# Patient Record
Sex: Female | Born: 1955 | Race: Black or African American | Hispanic: No | Marital: Single | Smoking: Former smoker
Health system: Southern US, Community
[De-identification: ages and names within clinical notes are randomized; demographics above are authoritative.]

## PROBLEM LIST (undated history)

## (undated) DIAGNOSIS — Z87442 Personal history of urinary calculi: Secondary | ICD-10-CM

## (undated) DIAGNOSIS — I10 Essential (primary) hypertension: Secondary | ICD-10-CM

## (undated) DIAGNOSIS — I779 Disorder of arteries and arterioles, unspecified: Secondary | ICD-10-CM

## (undated) DIAGNOSIS — M1990 Unspecified osteoarthritis, unspecified site: Secondary | ICD-10-CM

## (undated) DIAGNOSIS — Z9289 Personal history of other medical treatment: Secondary | ICD-10-CM

## (undated) DIAGNOSIS — E785 Hyperlipidemia, unspecified: Secondary | ICD-10-CM

## (undated) DIAGNOSIS — J45909 Unspecified asthma, uncomplicated: Secondary | ICD-10-CM

## (undated) DIAGNOSIS — E1140 Type 2 diabetes mellitus with diabetic neuropathy, unspecified: Secondary | ICD-10-CM

## (undated) DIAGNOSIS — I480 Paroxysmal atrial fibrillation: Secondary | ICD-10-CM

## (undated) DIAGNOSIS — I509 Heart failure, unspecified: Secondary | ICD-10-CM

## (undated) DIAGNOSIS — R55 Syncope and collapse: Secondary | ICD-10-CM

## (undated) DIAGNOSIS — Z8719 Personal history of other diseases of the digestive system: Secondary | ICD-10-CM

## (undated) DIAGNOSIS — K219 Gastro-esophageal reflux disease without esophagitis: Secondary | ICD-10-CM

## (undated) DIAGNOSIS — G4730 Sleep apnea, unspecified: Secondary | ICD-10-CM

## (undated) DIAGNOSIS — I2510 Atherosclerotic heart disease of native coronary artery without angina pectoris: Secondary | ICD-10-CM

## (undated) DIAGNOSIS — I499 Cardiac arrhythmia, unspecified: Secondary | ICD-10-CM

## (undated) DIAGNOSIS — I639 Cerebral infarction, unspecified: Secondary | ICD-10-CM

## (undated) HISTORY — DX: Paroxysmal atrial fibrillation: I48.0

## (undated) HISTORY — DX: Hyperlipidemia, unspecified: E78.5

## (undated) HISTORY — PX: DILATION AND CURETTAGE OF UTERUS: SHX78

## (undated) HISTORY — PX: EYE SURGERY: SHX253

## (undated) HISTORY — PX: HAND SURGERY: SHX662

## (undated) HISTORY — DX: Disorder of arteries and arterioles, unspecified: I77.9

## (undated) HISTORY — PX: CATARACT EXTRACTION: SUR2

## (undated) HISTORY — DX: Unspecified asthma, uncomplicated: J45.909

## (undated) HISTORY — DX: Heart failure, unspecified: I50.9

## (undated) HISTORY — DX: Cerebral infarction, unspecified: I63.9

## (undated) HISTORY — DX: Gastro-esophageal reflux disease without esophagitis: K21.9

## (undated) HISTORY — DX: Personal history of other medical treatment: Z92.89

## (undated) HISTORY — DX: Syncope and collapse: R55

## (undated) HISTORY — PX: COLONOSCOPY: SHX174

## (undated) HISTORY — DX: Essential (primary) hypertension: I10

## (undated) HISTORY — DX: Morbid (severe) obesity due to excess calories: E66.01

## (undated) HISTORY — DX: Unspecified osteoarthritis, unspecified site: M19.90

---

## 1993-02-09 HISTORY — PX: ACHILLES TENDON REPAIR: SUR1153

## 1997-12-06 ENCOUNTER — Other Ambulatory Visit: Admission: RE | Admit: 1997-12-06 | Discharge: 1997-12-06 | Payer: Self-pay | Admitting: *Deleted

## 2000-04-20 ENCOUNTER — Encounter: Admission: RE | Admit: 2000-04-20 | Discharge: 2000-07-19 | Payer: Self-pay | Admitting: Family Medicine

## 2000-05-12 ENCOUNTER — Other Ambulatory Visit: Admission: RE | Admit: 2000-05-12 | Discharge: 2000-05-12 | Payer: Self-pay | Admitting: Obstetrics and Gynecology

## 2000-11-12 ENCOUNTER — Ambulatory Visit (HOSPITAL_COMMUNITY): Admission: RE | Admit: 2000-11-12 | Discharge: 2000-11-12 | Payer: Self-pay | Admitting: Family Medicine

## 2000-11-12 ENCOUNTER — Encounter: Payer: Self-pay | Admitting: Family Medicine

## 2001-05-18 ENCOUNTER — Other Ambulatory Visit: Admission: RE | Admit: 2001-05-18 | Discharge: 2001-05-18 | Payer: Self-pay | Admitting: Obstetrics and Gynecology

## 2001-05-28 ENCOUNTER — Emergency Department (HOSPITAL_COMMUNITY): Admission: EM | Admit: 2001-05-28 | Discharge: 2001-05-28 | Payer: Self-pay | Admitting: Emergency Medicine

## 2002-07-19 ENCOUNTER — Encounter: Payer: Self-pay | Admitting: Emergency Medicine

## 2002-07-19 ENCOUNTER — Emergency Department (HOSPITAL_COMMUNITY): Admission: EM | Admit: 2002-07-19 | Discharge: 2002-07-19 | Payer: Self-pay

## 2002-07-24 ENCOUNTER — Ambulatory Visit (HOSPITAL_COMMUNITY): Admission: RE | Admit: 2002-07-24 | Discharge: 2002-07-24 | Payer: Self-pay | Admitting: Family Medicine

## 2002-07-24 ENCOUNTER — Encounter: Payer: Self-pay | Admitting: Family Medicine

## 2004-09-05 ENCOUNTER — Other Ambulatory Visit: Admission: RE | Admit: 2004-09-05 | Discharge: 2004-09-05 | Payer: Self-pay | Admitting: Obstetrics and Gynecology

## 2005-01-22 ENCOUNTER — Inpatient Hospital Stay (HOSPITAL_COMMUNITY): Admission: AD | Admit: 2005-01-22 | Discharge: 2005-01-22 | Payer: Self-pay | Admitting: Obstetrics and Gynecology

## 2005-01-22 IMAGING — US US TRANSVAGINAL NON-OB
1 series · 13 of 25 positions shown · non-contrast
Comparison: None.

CLINICAL DATA: 49-year-old female, with known uterine fibroids and dysfunctional uterine bleeding.  
TRANSABDOMINAL AND TRANSVAGINAL PELVIC ULTRASOUND:
TECHNIQUE: Both transabdominal and transvaginal ultrasound examinations of the pelvis were performed including evaluation of the uterus, ovaries, adnexal regions, and pelvic cul-de-sac.

[Series 1: us transvaginal non-ob · 0.45mm/px · 13 of 80 slices shown]
[im 1/80]
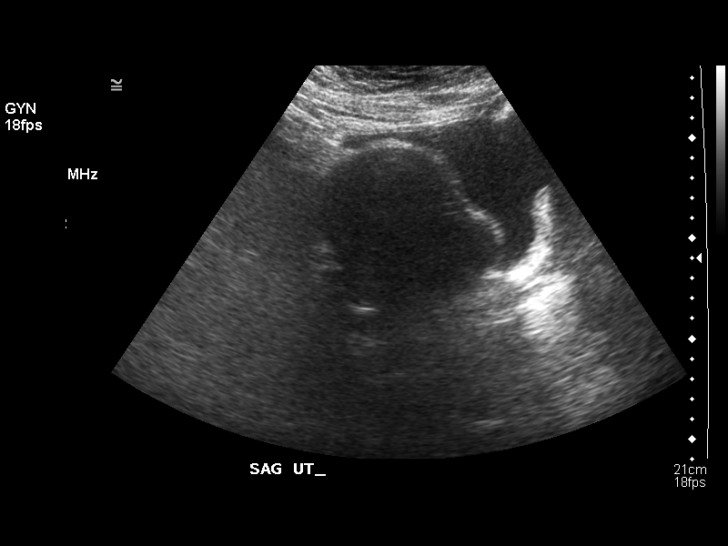
[im 7/80]
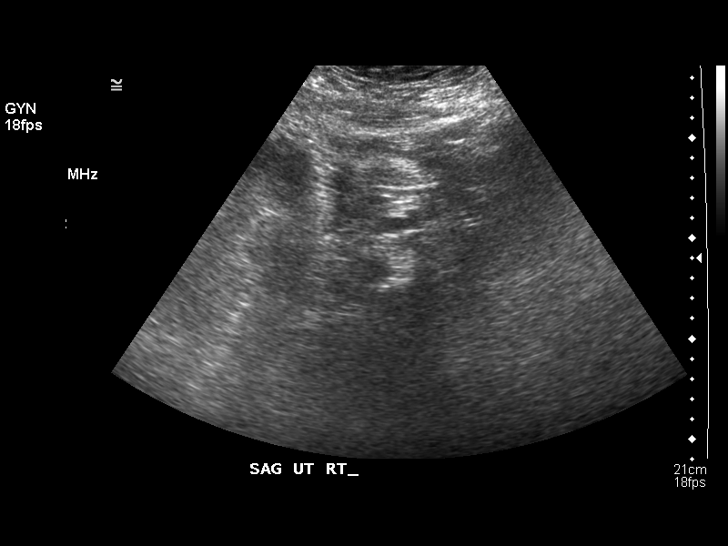
[im 14/80]
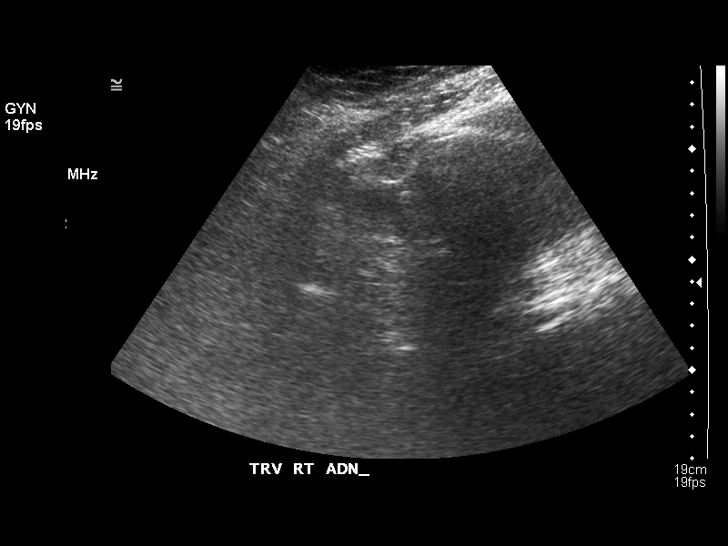
[im 20/80]
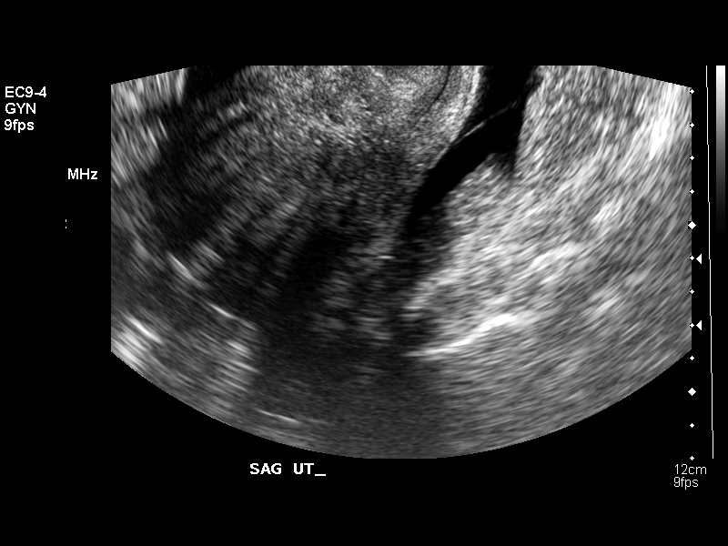
[im 27/80]
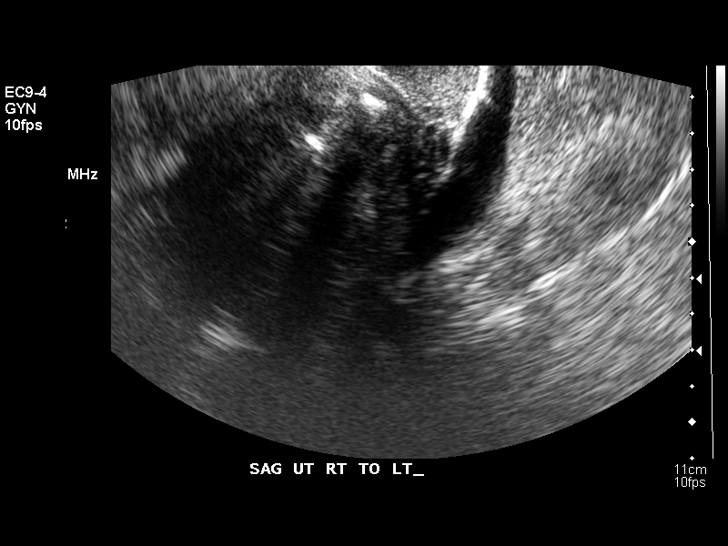
[im 33/80]
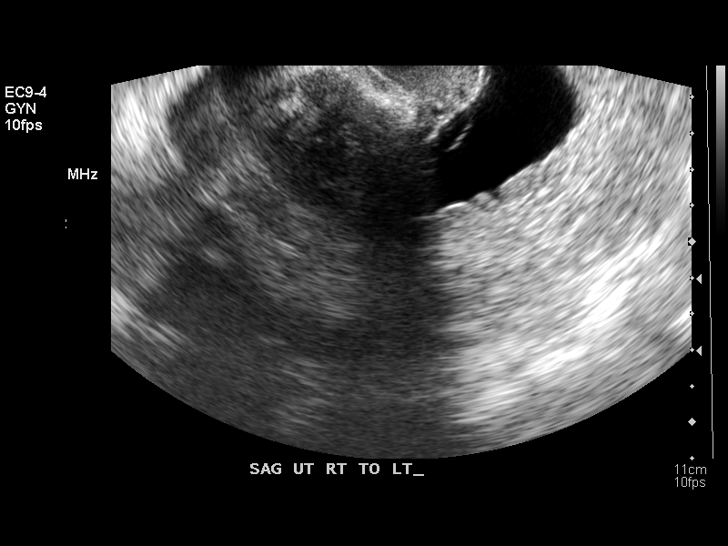
[im 40/80]
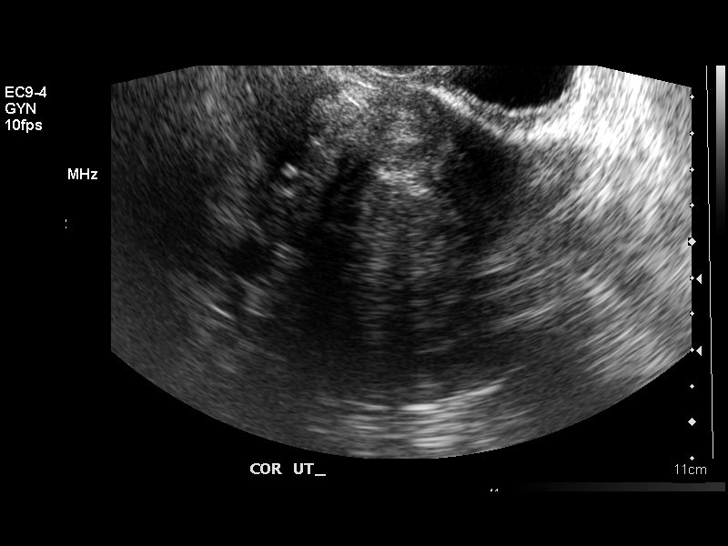
[im 47/80]
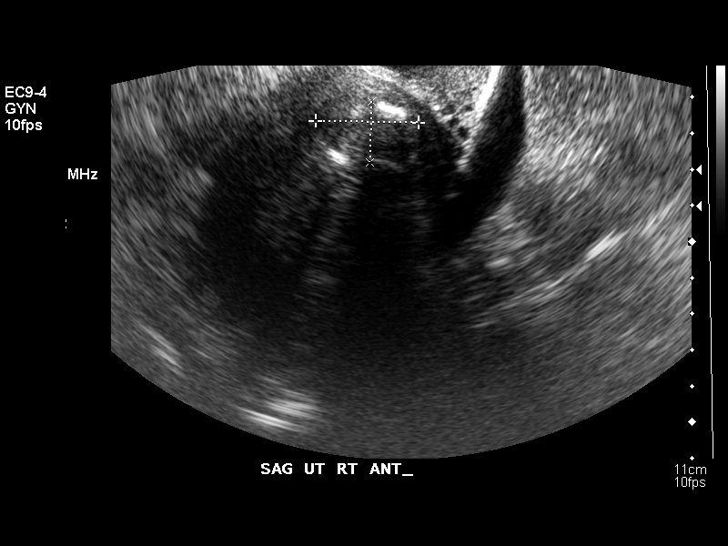
[im 53/80]
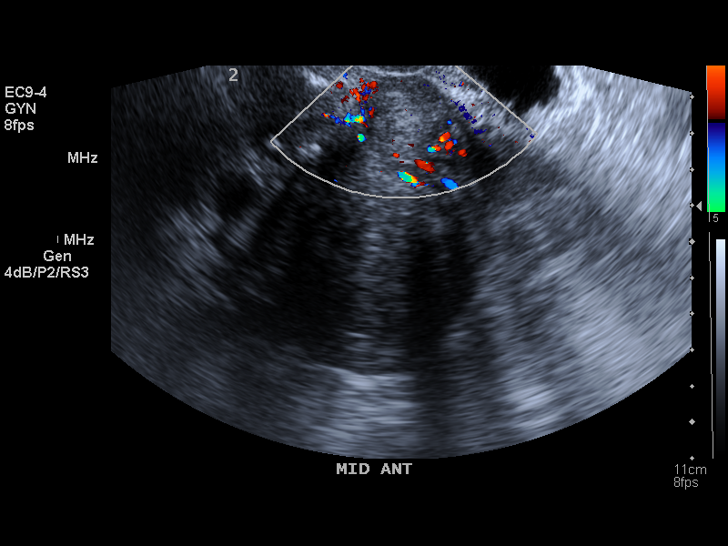
[im 60/80]
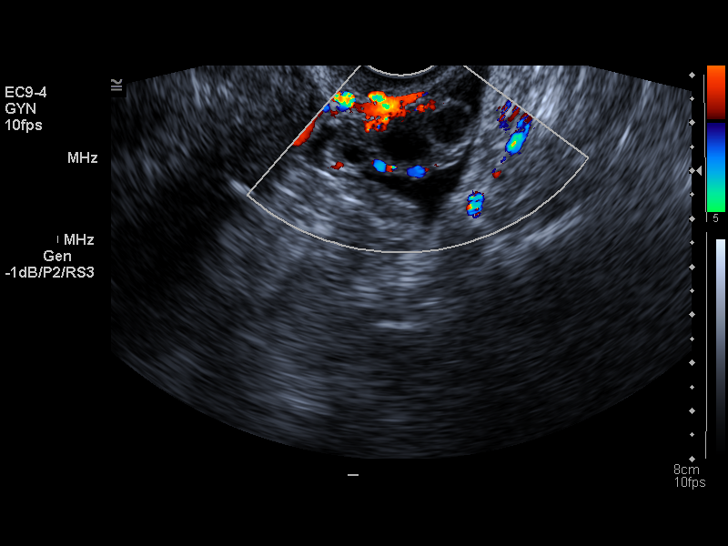
[im 66/80]
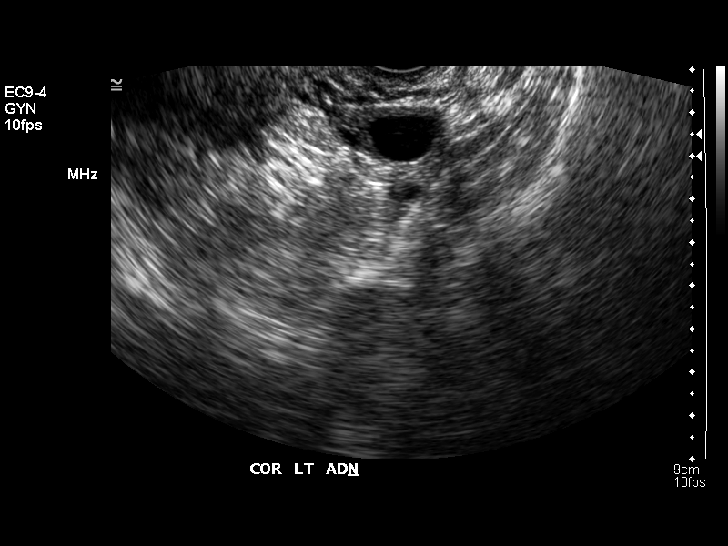
[im 73/80]
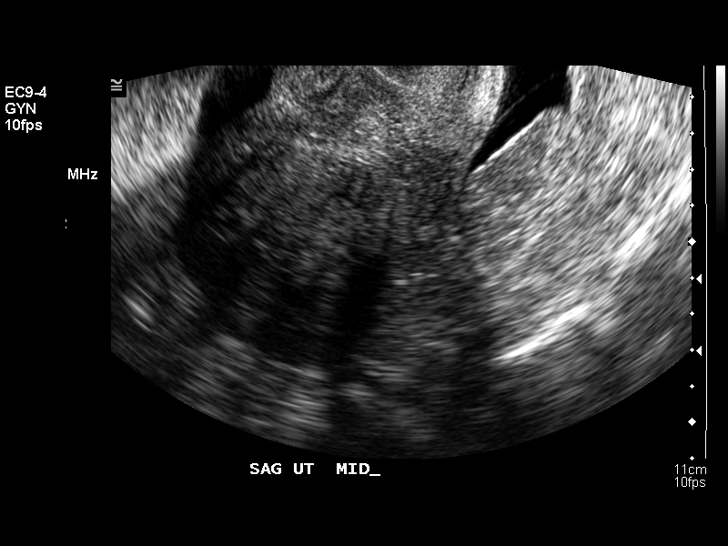
[im 80/80]
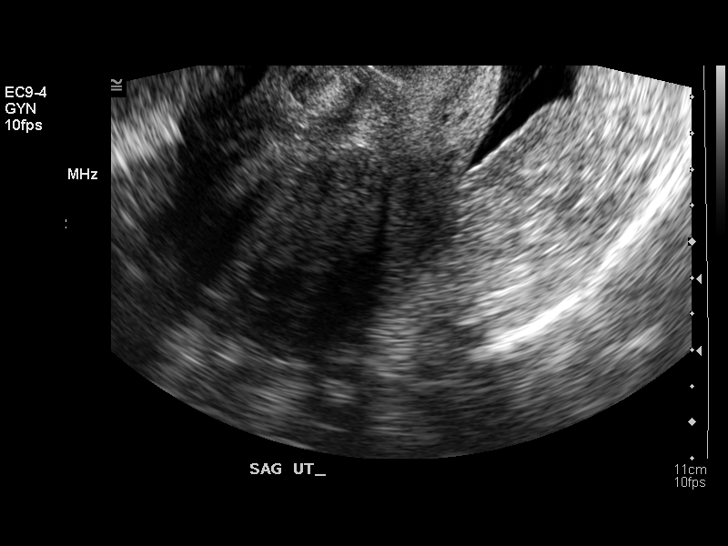

[13 of 25 positions shown; findings below may reference images not displayed]

FINDINGS: The uterus is enlarged measuring 10.0 x 7.5 x 7.0 cm.  The uterus has a lobulated contour with diffuse heterogeneity and at least three fibroids identified.  In the anterior lower uterine segment, there are two adjacent fibroids one measuring 3.4 x 2.6 x 2.8 cm and a second adjacent fibroid measuring 3.0 x 2.6 x 2.7 cm.  Centrally, there is a dominant fundal fibroid likely with a submucosal component measuring 4.9 x 3.9 x 4.7 cm.  There is mass effect upon the endometrium.  The endometrium is not visualized completely but roughly measures 8.5 mm in thickness.  The right ovary was not visualized.  The left ovary is normal measuring 31 x 14 x 21 mm.  There is a moderate amount of free fluid.
IMPRESSION: 1.  Enlarged uterus with multiple uterine fibroids (at least three).  Dominant fibroid is 5 cm and centrally located with a submucosal component. 
2.  Nonvisualization of the right ovary.
3.  Normal left ovary.
4.  Moderate free fluid.

## 2005-01-29 ENCOUNTER — Encounter (INDEPENDENT_AMBULATORY_CARE_PROVIDER_SITE_OTHER): Payer: Self-pay | Admitting: Specialist

## 2005-01-29 ENCOUNTER — Ambulatory Visit (HOSPITAL_COMMUNITY): Admission: RE | Admit: 2005-01-29 | Discharge: 2005-01-29 | Payer: Self-pay | Admitting: Obstetrics and Gynecology

## 2005-02-09 HISTORY — PX: OTHER SURGICAL HISTORY: SHX169

## 2006-02-09 HISTORY — PX: UTERINE FIBROID SURGERY: SHX826

## 2006-03-23 IMAGING — CR CXR
1 series · 2 of 2 positions shown · non-contrast
Comparison: none

NAME: GATHE

REASON: MAMMO...HM[PHONE_NUMBER]
The current exam is compared to a prior examination of [DATE] with no
significant interval changes seen. The previously noted nodule in the
lateral aspect of the RIGHT breast is again seen and remains unchanged
in appearance. No new masses or nodules suspicious for malignancy are
observed. A few benign appearing calcifications are again seen
bilaterally.

[Series 1: view not recorded · 0.17mm/px · 2 of 2 slices shown]
[im 1/2]
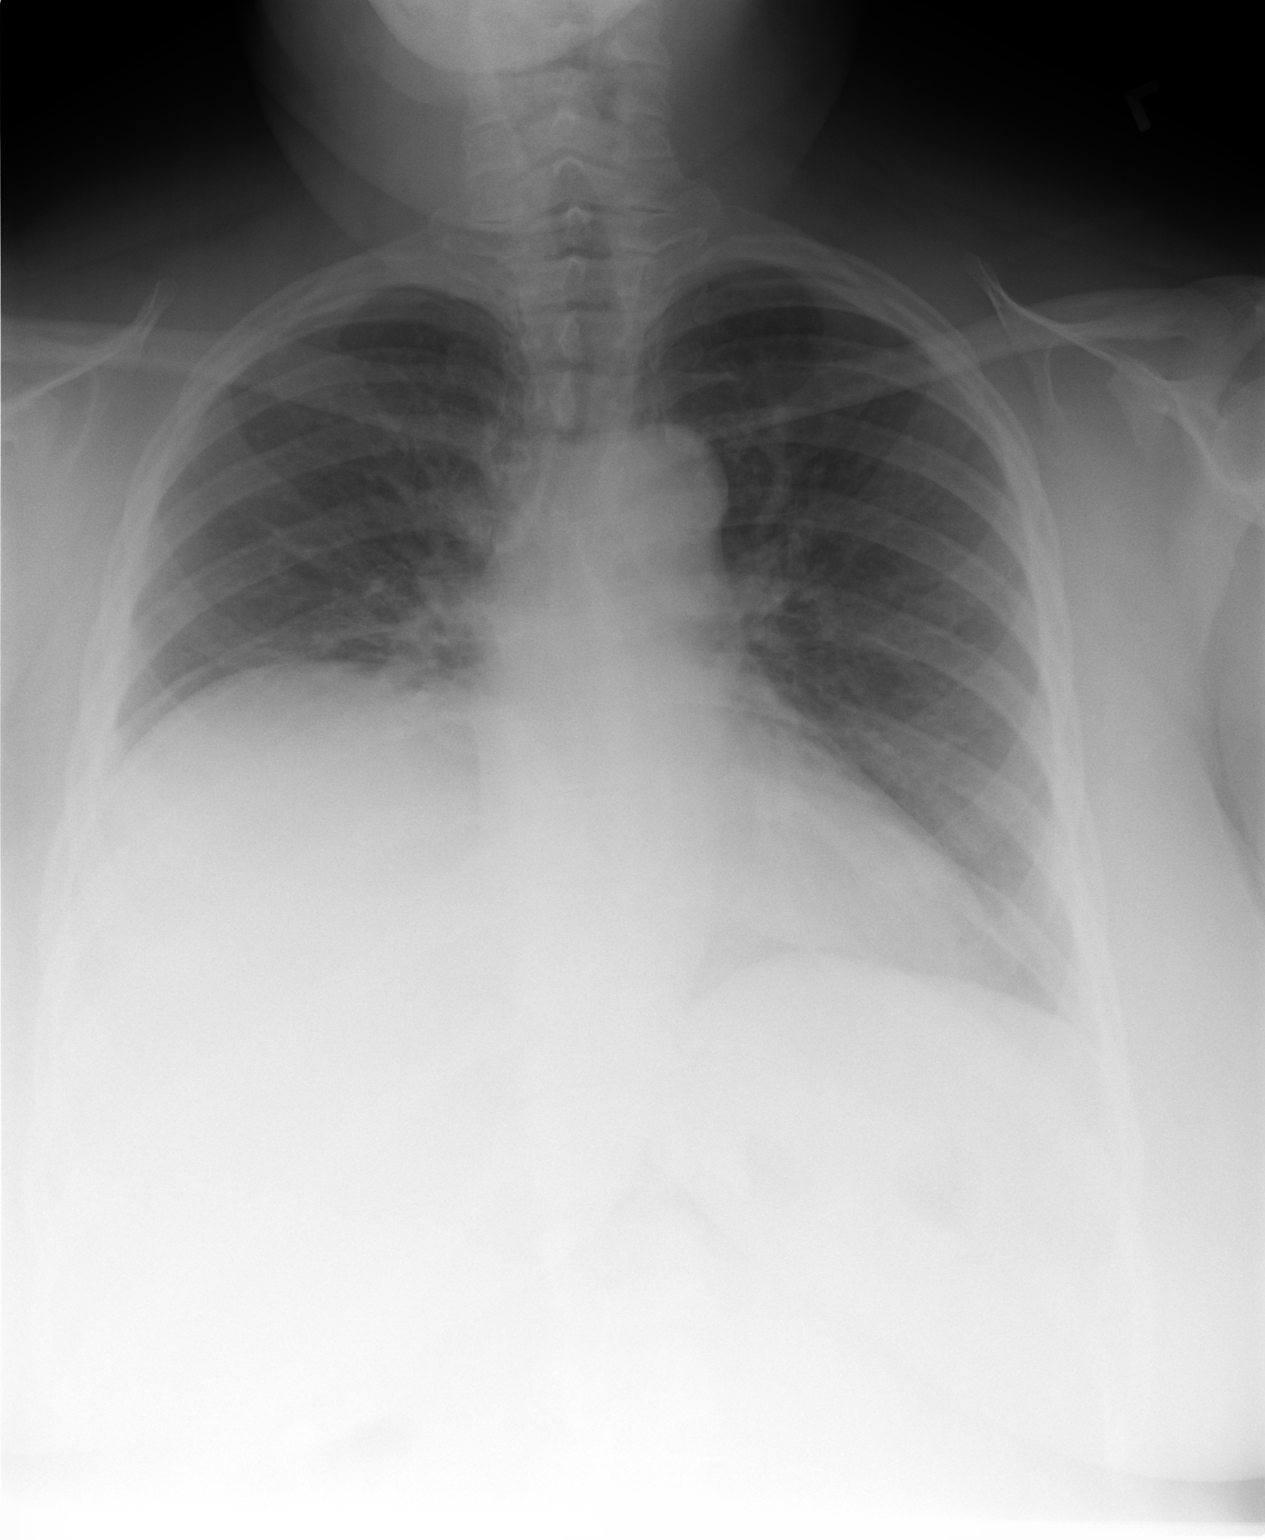
[im 2/2]
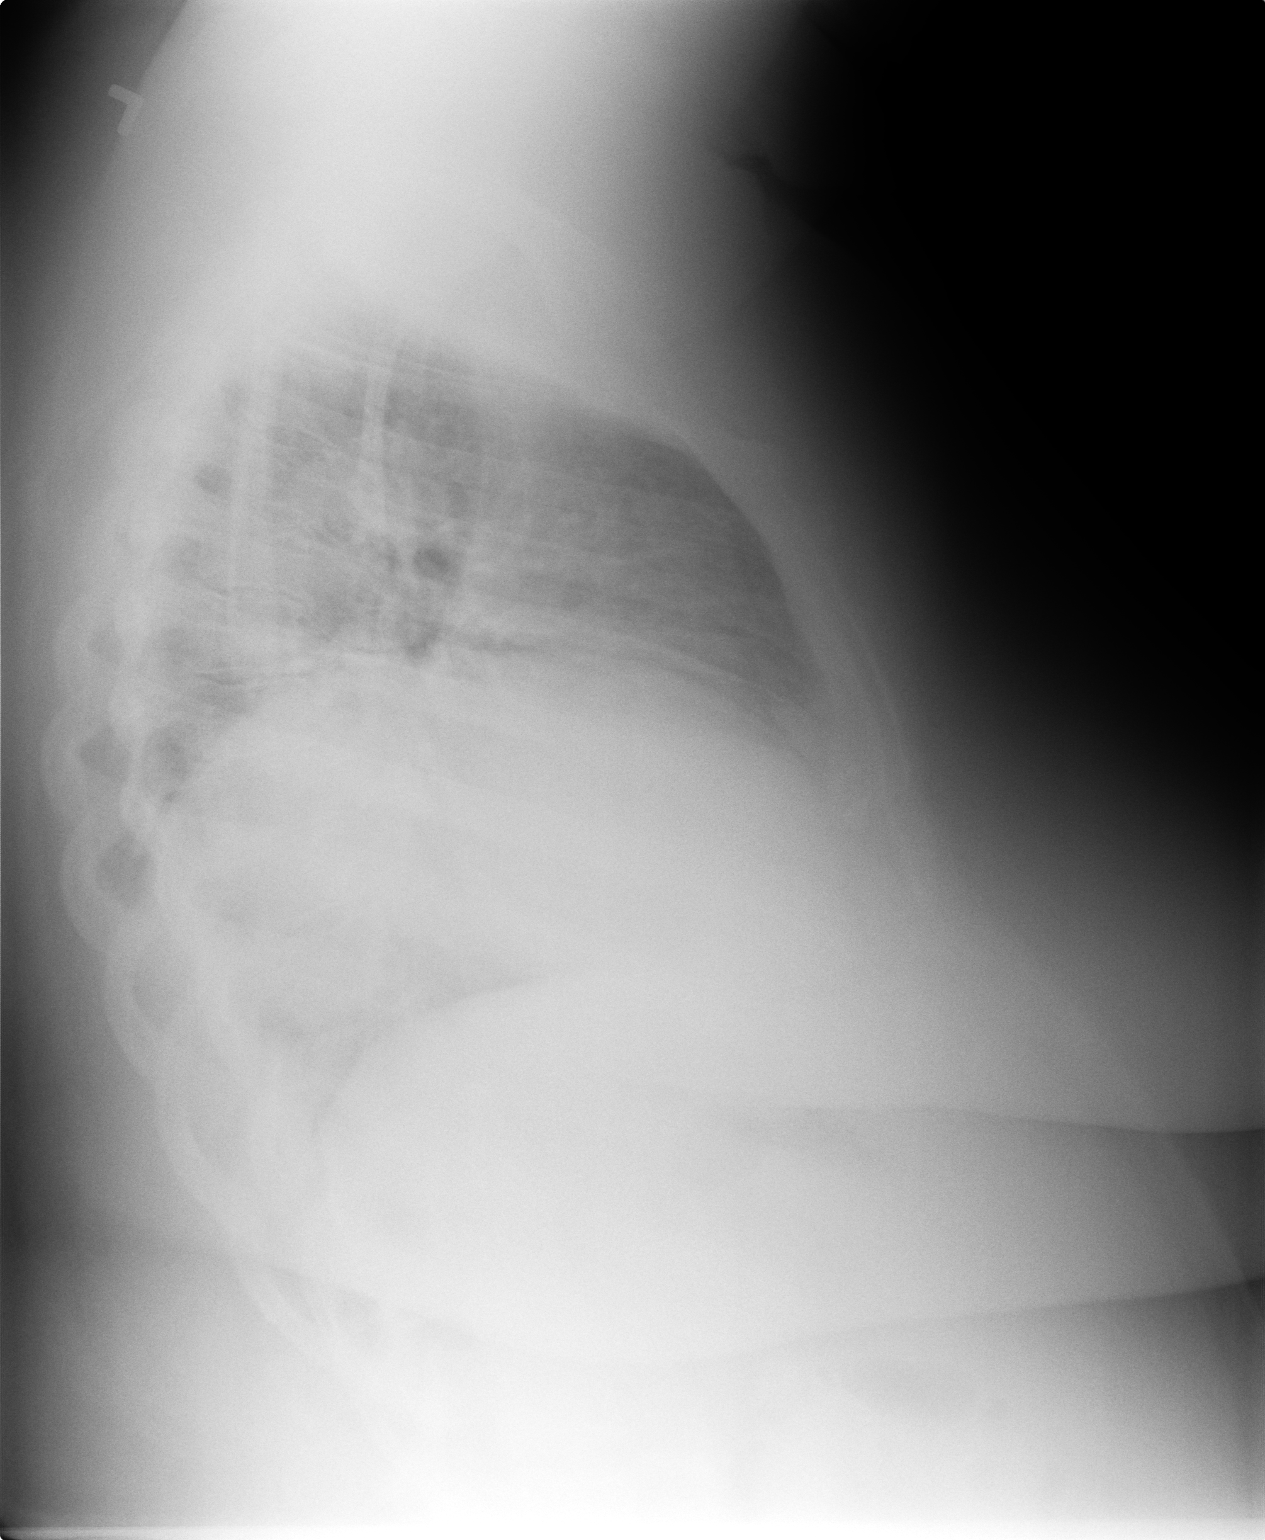

[2 of 2 positions shown; findings below may reference images not displayed]

CONCLUSION: 1. Stable, bilaterally benign appearing mammography.
2. Stable benign appearing nodule is again seen laterally in the RIGHT
 breast.
3. Continued annual mammography or annual screening mammography is
 recommended.

NAME: GATHE

4. BI-RADS: Category 2--Benign Findings.

A NEGATIVE MAMMOGRAM REPORT DOES NOT PRECLUDE BIOPSY OR OTHER EVALUATION

## 2006-06-17 ENCOUNTER — Inpatient Hospital Stay (HOSPITAL_BASED_OUTPATIENT_CLINIC_OR_DEPARTMENT_OTHER): Admission: RE | Admit: 2006-06-17 | Discharge: 2006-06-17 | Payer: Self-pay | Admitting: Interventional Cardiology

## 2006-06-22 ENCOUNTER — Inpatient Hospital Stay (HOSPITAL_COMMUNITY): Admission: RE | Admit: 2006-06-22 | Discharge: 2006-06-23 | Payer: Self-pay | Admitting: Interventional Cardiology

## 2006-07-12 ENCOUNTER — Encounter: Admission: RE | Admit: 2006-07-12 | Discharge: 2006-07-12 | Payer: Self-pay | Admitting: Interventional Cardiology

## 2006-07-12 IMAGING — US US MISC SOFT TISSUE
1 series · 7 of 7 positions shown · non-contrast
Comparison: none

CLINICAL DATA: Evaluate right groin after catheterization.  Possible hematoma. 
 ULTRASOUND SOFT TISSUE:
TECHNIQUE: Scans over the right groin were performed.

[Series 1: unknown · 0.10mm/px · 7 of 7 slices shown]
[im 1/7]
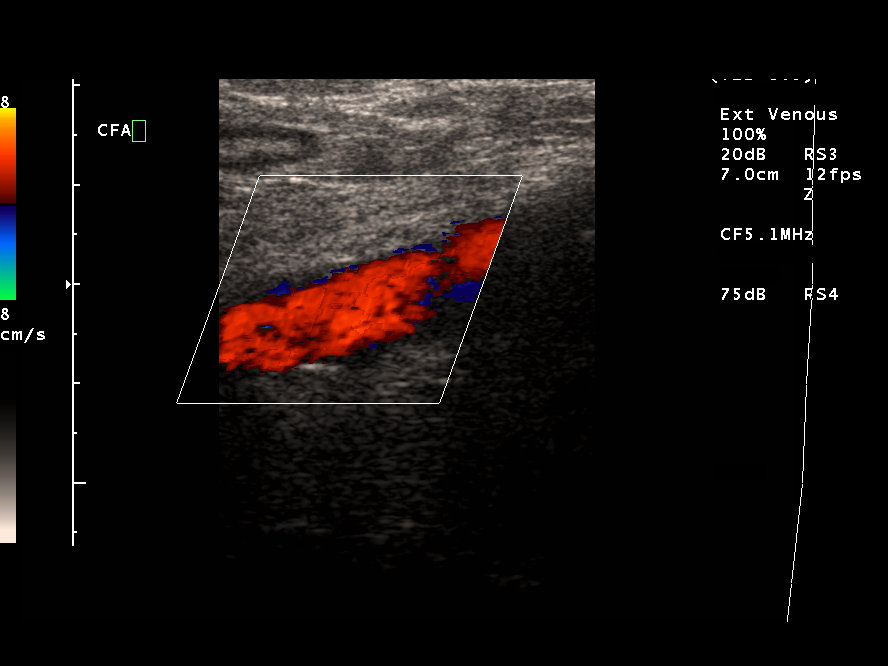
[im 2/7]
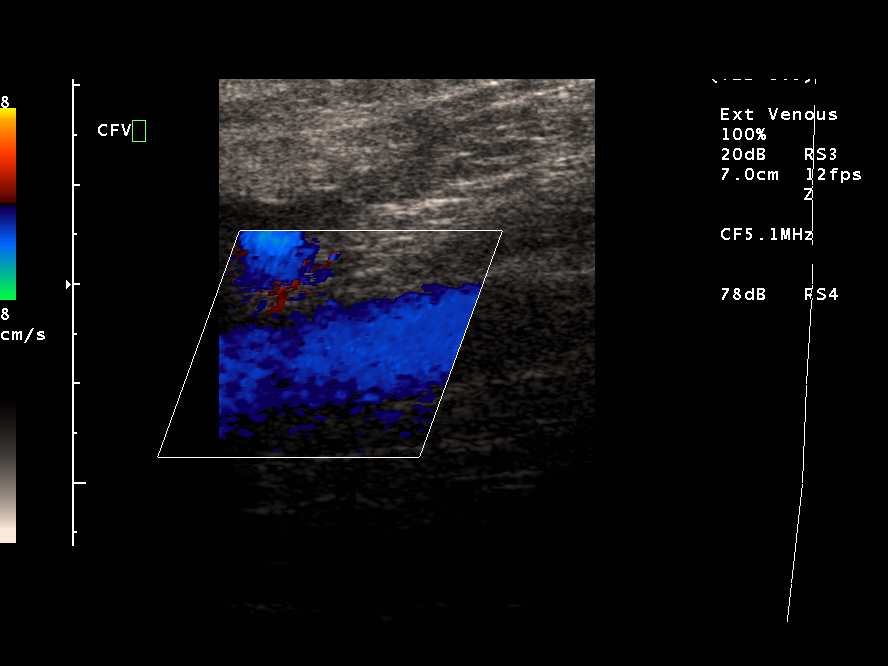
[im 3/7]
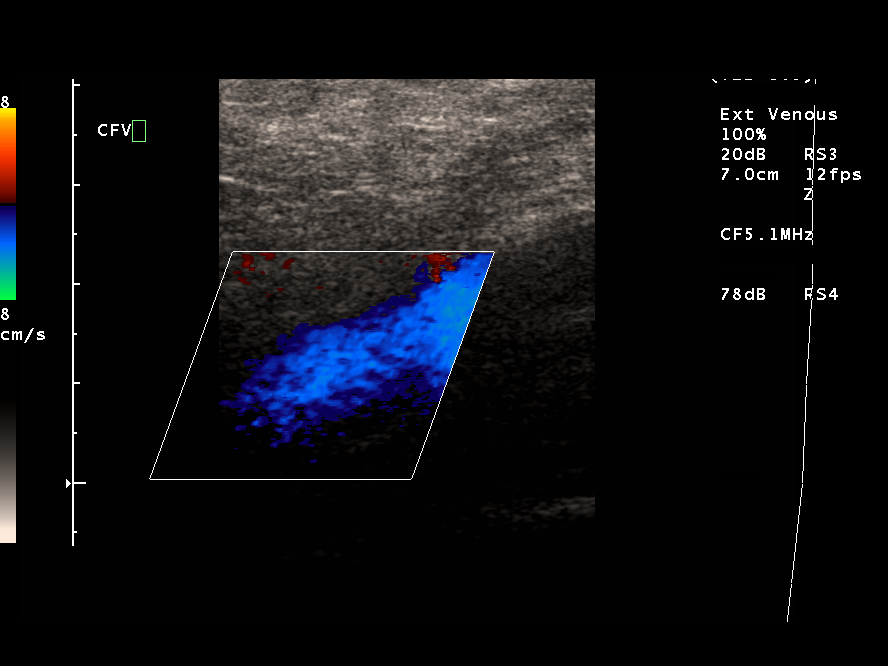
[im 4/7]
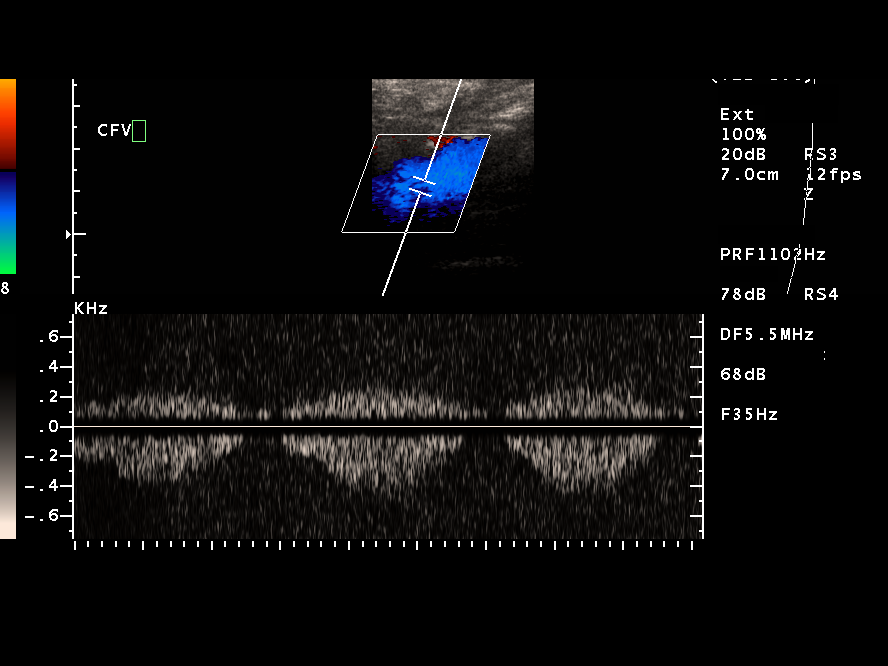
[im 5/7]
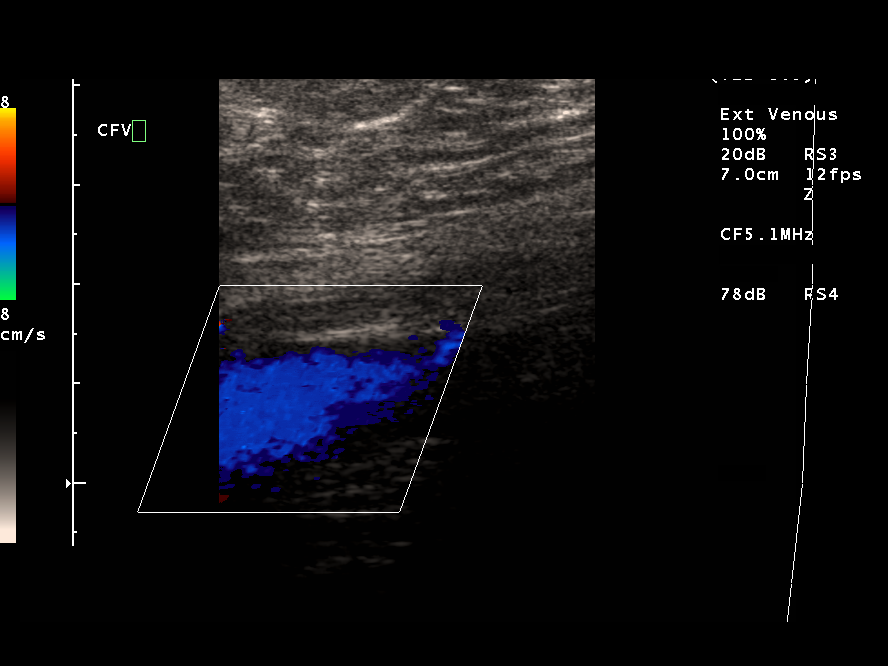
[im 6/7]
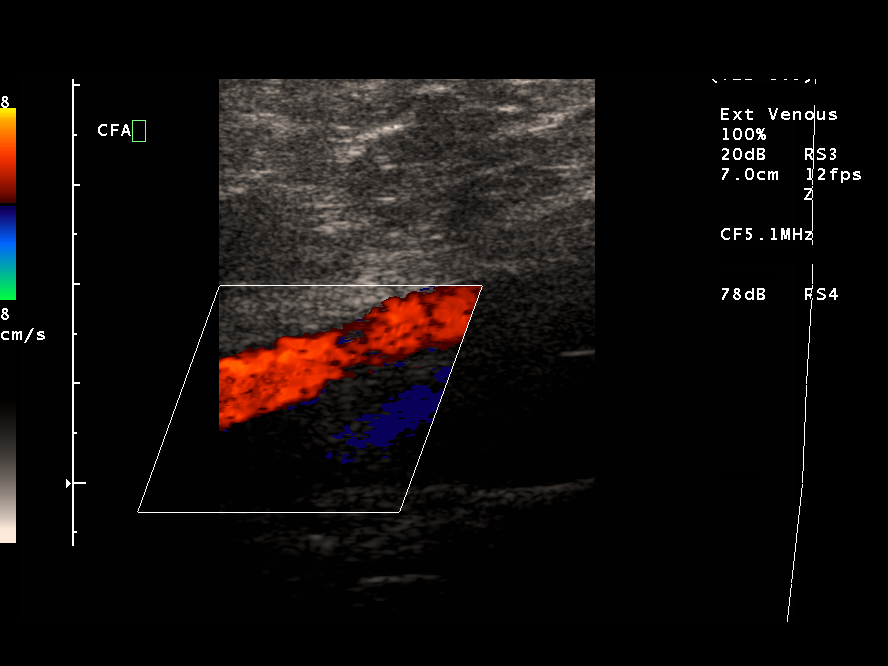
[im 7/7]
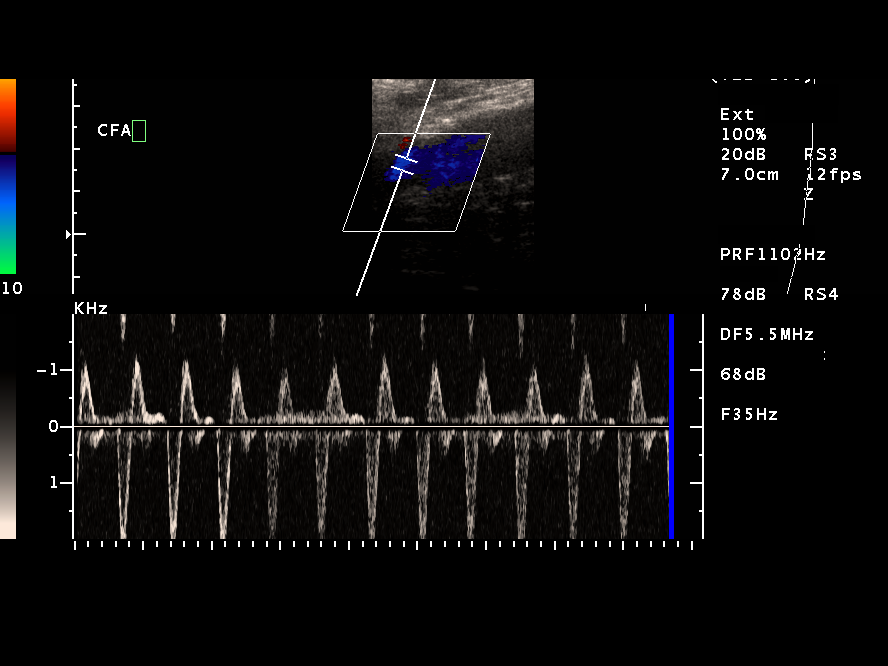

[7 of 7 positions shown; findings below may reference images not displayed]

FINDINGS: No hematoma is seen.  Both artery and vein appear normal with no pseudoaneurysm evident.
IMPRESSION: Negative ultrasound of the right groin.  No hematoma is seen.

## 2006-07-19 ENCOUNTER — Encounter (HOSPITAL_COMMUNITY): Admission: RE | Admit: 2006-07-19 | Discharge: 2006-10-17 | Payer: Self-pay | Admitting: Interventional Cardiology

## 2006-12-20 ENCOUNTER — Ambulatory Visit (HOSPITAL_COMMUNITY): Admission: RE | Admit: 2006-12-20 | Discharge: 2006-12-20 | Payer: Self-pay | Admitting: Family Medicine

## 2006-12-20 ENCOUNTER — Ambulatory Visit: Payer: Self-pay | Admitting: *Deleted

## 2006-12-20 ENCOUNTER — Encounter (INDEPENDENT_AMBULATORY_CARE_PROVIDER_SITE_OTHER): Payer: Self-pay | Admitting: Family Medicine

## 2008-03-09 ENCOUNTER — Encounter: Admission: RE | Admit: 2008-03-09 | Discharge: 2008-03-09 | Payer: Self-pay | Admitting: Endocrinology

## 2008-07-01 ENCOUNTER — Emergency Department (HOSPITAL_COMMUNITY): Admission: EM | Admit: 2008-07-01 | Discharge: 2008-07-01 | Payer: Self-pay | Admitting: Family Medicine

## 2009-06-13 ENCOUNTER — Emergency Department (HOSPITAL_COMMUNITY): Admission: EM | Admit: 2009-06-13 | Discharge: 2009-06-13 | Payer: Self-pay | Admitting: Family Medicine

## 2009-06-26 ENCOUNTER — Encounter: Admission: RE | Admit: 2009-06-26 | Discharge: 2009-06-26 | Payer: Self-pay | Admitting: Family Medicine

## 2009-06-26 IMAGING — CR DG CHEST 2V
2 series · 2 of 2 positions shown · non-contrast
Comparison: None.

CLINICAL DATA: Cough

CHEST - 2 VIEW

[view not recorded (1 of 2)]
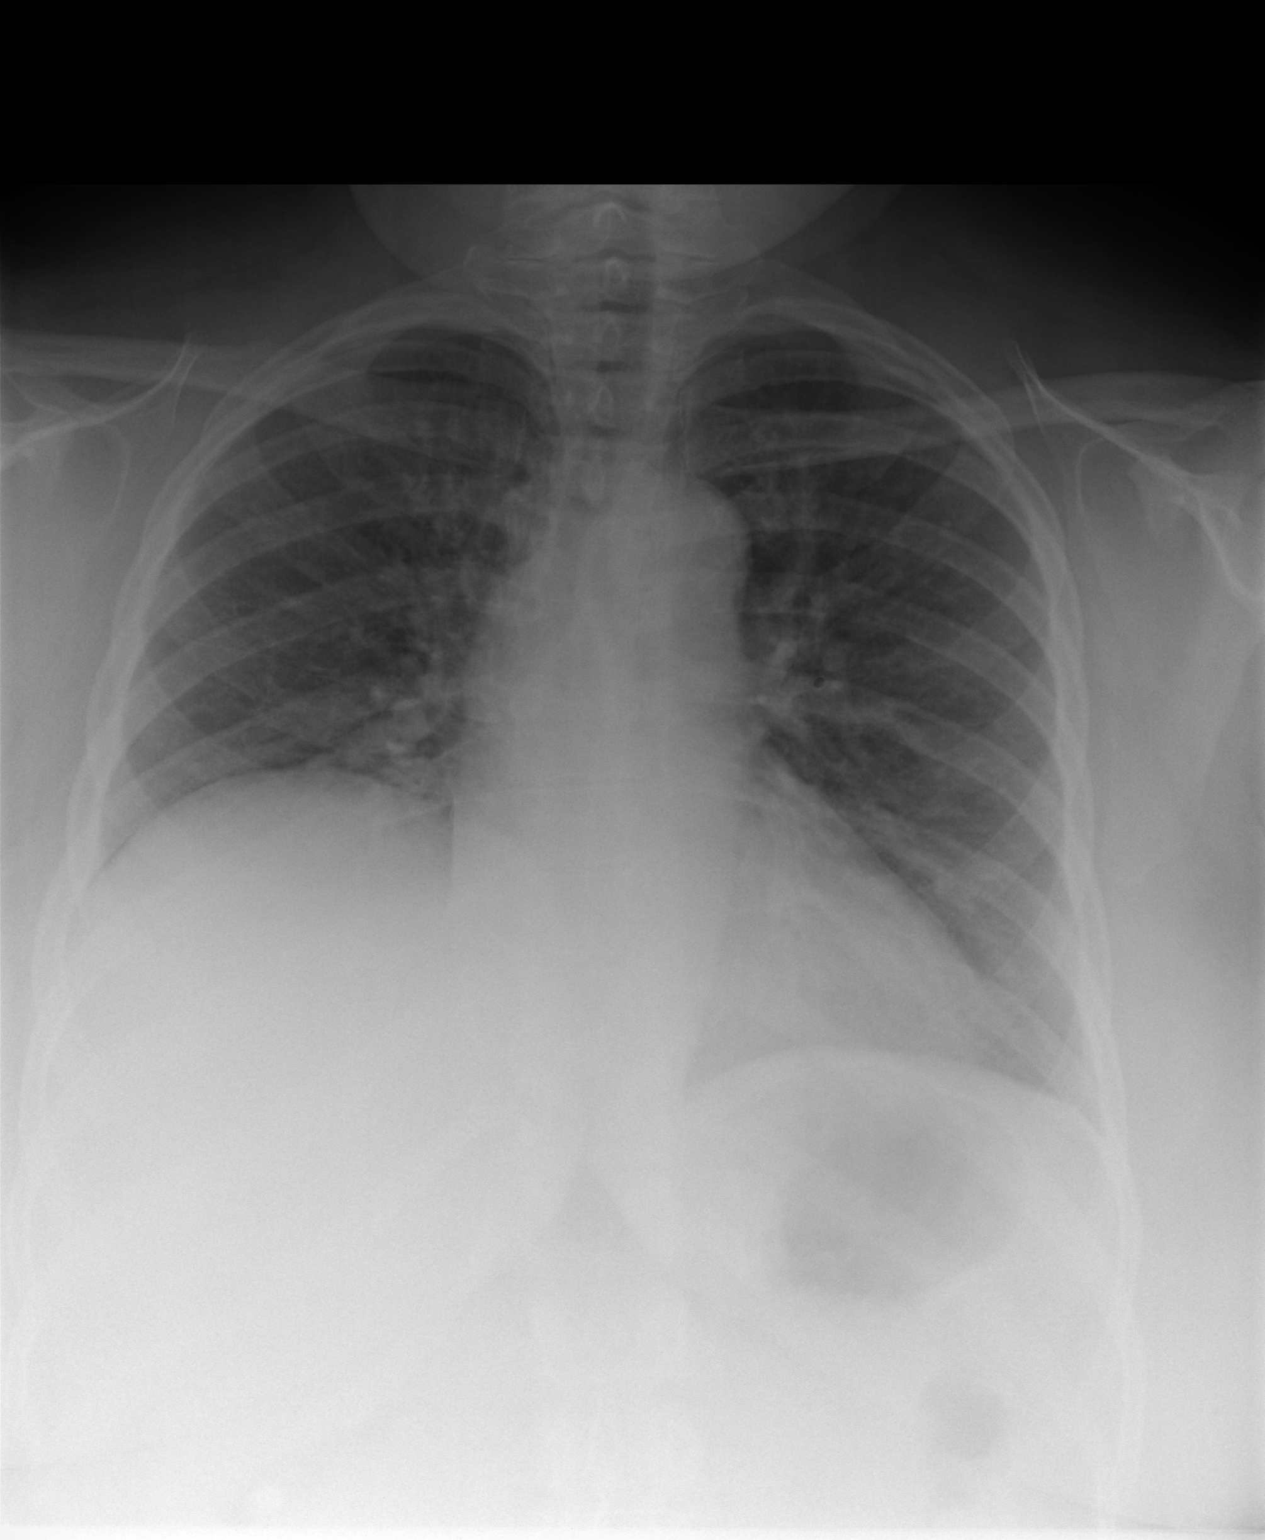

[view not recorded (2 of 2)]
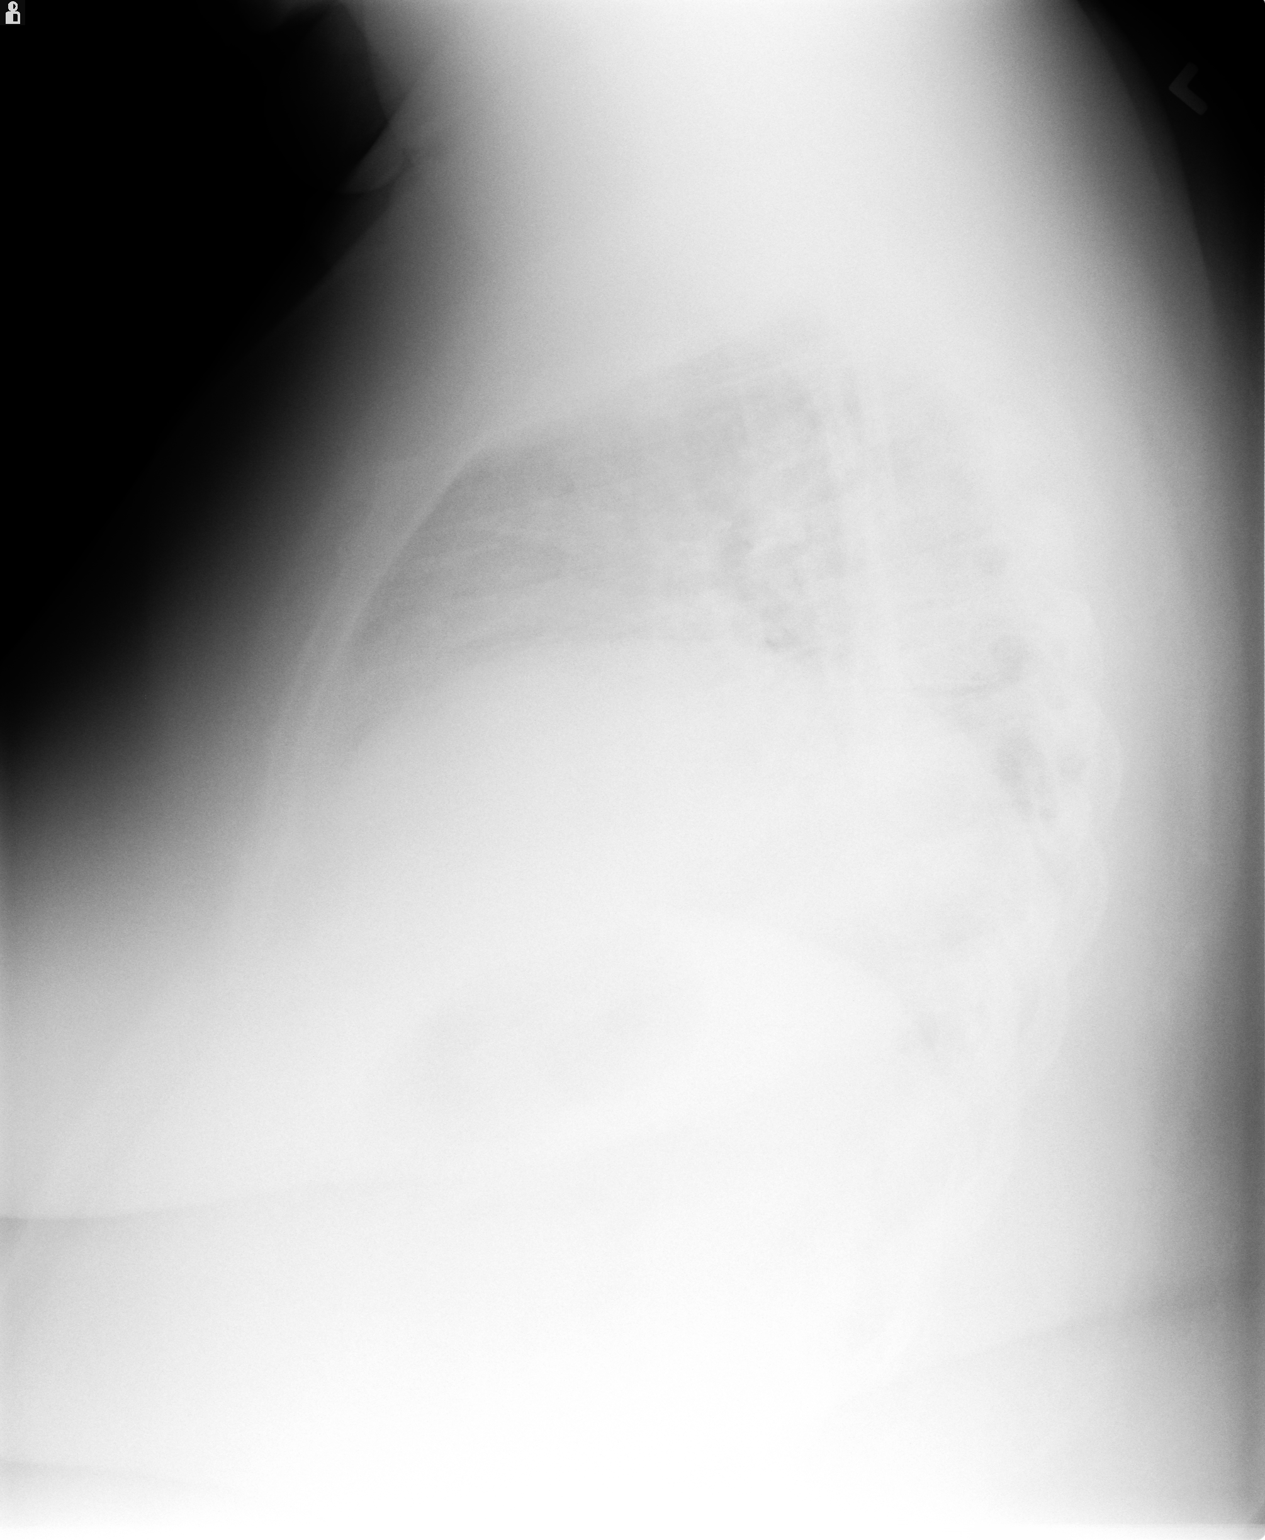

[2 of 2 positions shown; findings below may reference images not displayed]

FINDINGS: Lungs are under aerated with extensive elevation of the
right hemidiaphragm.  No consolidation or mass.  Heart is normal in
size.  No pneumothorax or pleural effusion.
IMPRESSION: Elevated right hemidiaphragm.

Low lung volumes.

No focal consolidation or mass.

## 2009-07-27 ENCOUNTER — Emergency Department (HOSPITAL_COMMUNITY): Admission: EM | Admit: 2009-07-27 | Discharge: 2009-07-27 | Payer: Self-pay | Admitting: Emergency Medicine

## 2009-07-27 IMAGING — CR DG CHEST 1V PORT
1 series · 1 of 1 positions shown · non-contrast
Comparison: [DATE].

CLINICAL DATA: Shortness of breath.  Back pain.

PORTABLE CHEST - 1 VIEW

[view not recorded]
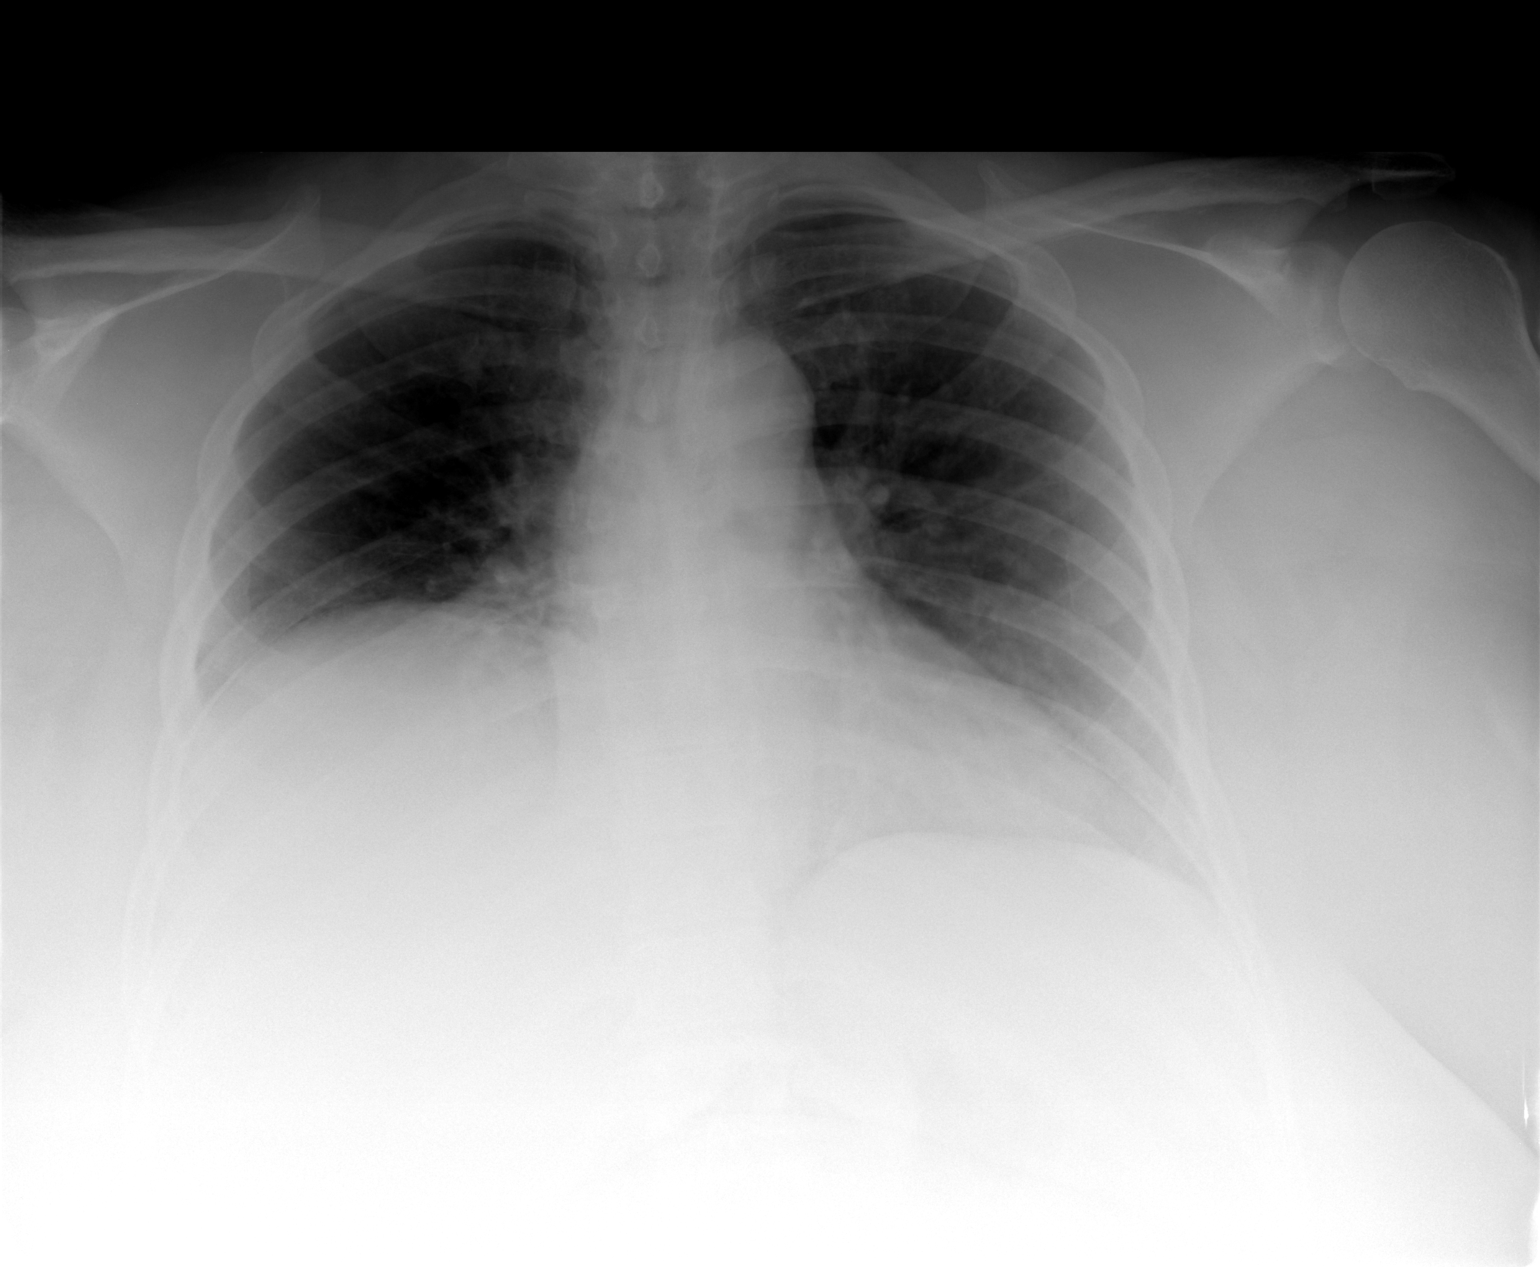

[1 of 1 positions shown; findings below may reference images not displayed]

FINDINGS: Poor into the prior portable chest with elevated right
hemidiaphragm.  Central pulmonary vascular prominence.  Mild
cardiomegaly.  Mildly tortuous aorta (if back pain is felt to be
related to aorta, CT will be necessary for further delineation).

Limit evaluation right lung base otherwise no segmental
consolidation.  No gross pneumothorax.  Limit evaluation of the
thoracic spine with mild dextroscoliosis mid thoracic region. The
patient would eventually benefit from two-view chest.
IMPRESSION: Mild cardiomegaly and central pulmonary vascular prominence.

Elevated right hemidiaphragm with limited evaluation of right lung
base.

Mildly tortuous aorta.

## 2009-07-27 IMAGING — CT CT ANGIO CHEST
1 of 6 series · 5 of 36 positions shown · IV contrast (Omnipaque 300)
Comparison: Chest x-ray [DATE].  No comparison chest CT.

CLINICAL DATA: Shortness of breath. Breath.  Back pain.

CT ANGIOGRAPHY CHEST WITH CONTRAST
TECHNIQUE: Multidetector CT imaging of the chest was performed
using the standard protocol during bolus administration of
intravenous contrast.  Multiplanar CT image reconstructions
including MIPs were obtained to evaluate the vascular anatomy.
Contrast:  100 ml [76].

[Series 4: pe 3.0 b40f · axial · 0.98mm/px · z∈[-172,-10]mm · 5 of 82 slices shown]
[im 14/82  lung]
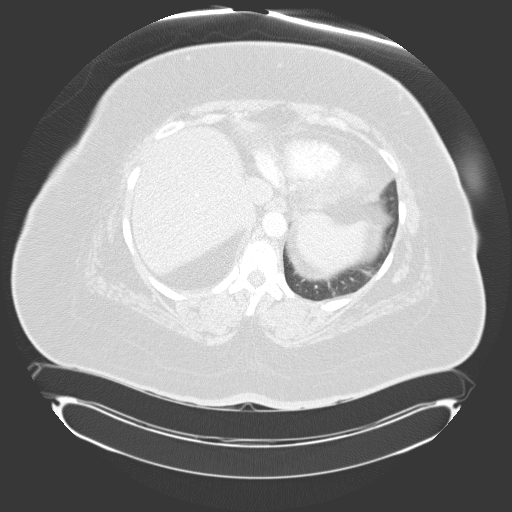
[im 28/82  mediastinal]
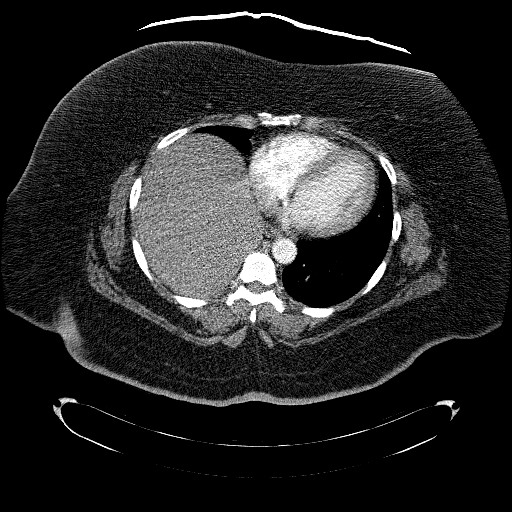
[im 41/82  lung]
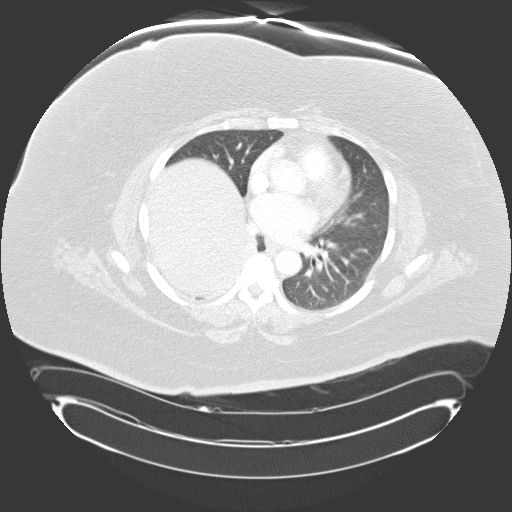
[im 55/82  mediastinal]
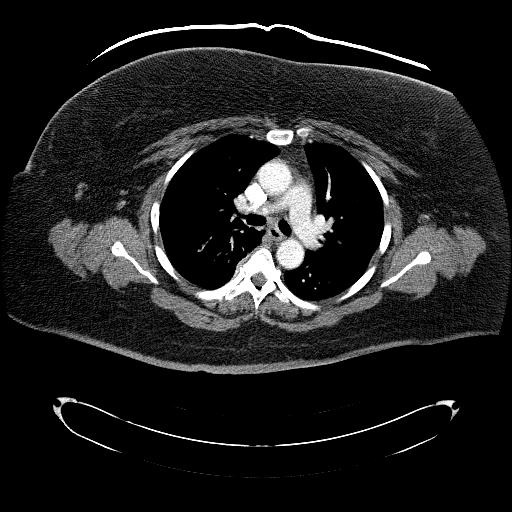
[im 68/82  lung]
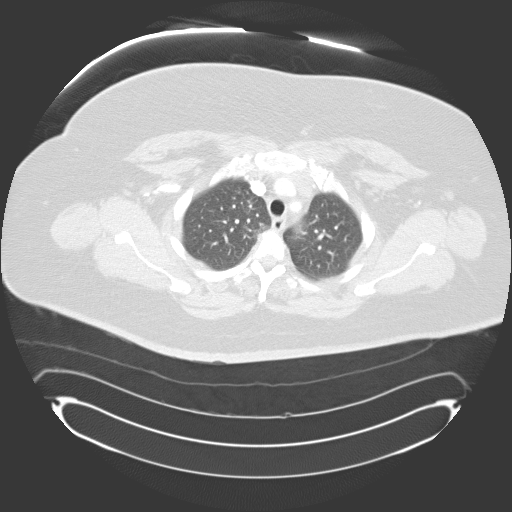

[5 of 36 positions shown; findings below may reference images not displayed]

FINDINGS: Coronary artery calcifications.  Evaluation of ascending
aorta is slightly limited by motion without findings of dissection.
Mild atherosclerotic type changes of the thoracic aorta. Ascending
thoracic aorta measures 3.3 x 3.2 cm.

Elevated right hemidiaphragm.  Cause of this is indeterminate.
Right base subsegmental atelectasis.  Secondary to the patient's
habitus and crowding of vessels along the right lung base
evaluation for pulmonary embolus is slightly limited.  No obvious
pulmonary embolus identified.

No segmental consolidation.  Normal sized mediastinal and hilar
lymph nodes.  Largest lymph node pretracheal region is 1.7 x
cm.

Mild substernal extension of left lobe of thyroid gland.  Right
renal cyst incompletely evaluated present exam.  Cyst arising from
the posterior aspect of the right lobe liver also suspected.

Thoracic spine degenerative changes.

Review of the MIP images confirms the above findings.
IMPRESSION: Secondary to the patient's habitus and crowding of vessels along
the right lung base evaluation for pulmonary embolus is slightly
limited.  No obvious pulmonary embolus identified.

Coronary artery calcifications.

Thoracic spine degenerative changes.

Elevated right hemidiaphragm.   Cause is indeterminate.

Fatty infiltration of the liver.

Right renal cyst incompletely evaluated present exam.  Cyst arising
from the posterior aspect right lobe liver also suspected.

## 2010-04-27 LAB — BASIC METABOLIC PANEL
CO2: 28 mEq/L (ref 19–32)
Calcium: 9.3 mg/dL (ref 8.4–10.5)
Creatinine, Ser: 0.72 mg/dL (ref 0.4–1.2)
GFR calc non Af Amer: >60 mL/min (ref >60)
Glucose, Bld: 190 mg/dL — ABNORMAL HIGH (ref 70–99)
Potassium: 3.7 mEq/L (ref 3.5–5.1)

## 2010-04-27 LAB — DIFFERENTIAL
Basophils Relative: 1 % (ref 0–1)
Eosinophils Absolute: 0.1 10*3/uL (ref 0.0–0.7)
Lymphs Abs: 2.3 10*3/uL (ref 0.7–4.0)
Monocytes Relative: 8 % (ref 3–12)
Neutro Abs: 3.7 10*3/uL (ref 1.7–7.7)

## 2010-04-27 LAB — CBC
Platelets: 309 10*3/uL (ref 150–400)
WBC: 6.7 10*3/uL (ref 4.0–10.5)

## 2010-04-27 LAB — POCT CARDIAC MARKERS
Myoglobin, poc: 65.5 ng/mL (ref 12–200)
Troponin i, poc: <0.05 ng/mL (ref 0.00–0.09)

## 2010-05-20 LAB — POCT URINALYSIS DIP (DEVICE)
Bilirubin Urine: NEGATIVE
Glucose, UA: NEGATIVE mg/dL
Protein, ur: 30 mg/dL — AB
Urobilinogen, UA: 0.2 mg/dL (ref 0.0–1.0)
pH: 6 (ref 5.0–8.0)

## 2010-06-24 NOTE — Cardiovascular Report (Signed)
[name], [name]              ACCOUNT NO.:  [id number]   MEDICAL RECORD NO.:  [id number]          PATIENT TYPE:  OIB   LOCATION:  1963                         FACILITY:  [facility]   PHYSICIAN:  Corky Crafts, MDDATE OF BIRTH:  1955-09-29   DATE OF PROCEDURE:  DATE OF DISCHARGE:                            CARDIAC CATHETERIZATION   REASON FOR TEST:  Stable angina and shortness of breath in a patient  with multiple risk factors for heart disease including diabetes and  hypertension.   PROCEDURES PERFORMED:  Left heart catheterization, left ventriculogram,  coronary angiogram, abdominal aortogram.   OPERATOR:  Dr. Eldridge Dace.   PROCEDURE NARRATIVE:  The risks and benefits of cardiac catheterization  were explained to the patient and informed consent was obtained.  The  patient was brought to the cath lab.  She was prepped and draped in  usual sterile fashion.  Right groin was infiltrated with 1% lidocaine.  A 4-French arterial sheath was placed into her right femoral artery  using modified Seldinger technique.  Left coronary artery angiography  was performed using a JL-4.0 catheter.  The catheter was advanced to the  vessel ostium under fluoroscopic guidance.  Digital angiography was  performed in multiple projections using hand injection of contrast.  Right coronary artery angiography was then performed using a JR-4.0  catheter.  The catheter was advanced to the vessel ostium under  fluoroscopic guidance.  Digital angiography was performed in multiple  projections using hand injection of contrast.  Pigtail catheter was then  advanced to the ascending aorta and across the aortic valve under  fluoroscopic guidance.  A power injection of contrast then was performed  in the third degree RAO position to image the left ventricle.  The  catheter was pulled back under continuous hemodynamic pressure  monitoring.  Catheter was then pulled back to the level of the abdominal  aorta.   Power injection of contrast was performed in the AP projection  to visualize the renal arteries.  The sheath will be removed using  manual compression.   FINDINGS:  Left main coronary artery is widely patent.  The left  anterior descending is a large vessel with a small first diagonal and a  large second diagonal.  Just proximal to the larger first diagonal,  there is a focal 70% stenosis.  There are mild irregularities in the  remainder of the vessel.  The left circumflex is large vessel with mild  irregularities.  There is a medium-sized OM-1 and medium-sized OM-2 with  minor irregularities.  The right coronary artery is medium-size dominant  vessel with minor irregularities.  Left ventriculogram revealed normal  left ventricular function with ejection fraction of 55-60%.  There is no  significant mitral regurgitation.   HEMODYNAMIC RESULTS:  Left ventricular pressure 141/1 with an LVEDP of 4  mmHg.  Aortic pressure of 140/83 with a mean aortic pressure of 108 mm  mercury.  The abdominal aortogram revealed no infrarenal abdominal  aortic aneurysm.  There appeared to be dual arterial supply to the left  kidney.  Both arteries appeared patent.  There was a large single  right  renal artery which appeared patent as well.   IMPRESSION:  1. A 70-80% stenosis in the mid LAD, which is likely the cause of the      patient's symptoms.  2. Normal left ventricular function with ejection fraction of 55-60%.  3. No renal artery stenosis.   RECOMMENDATIONS:  Will start the patient on Plavix today.  Will plan on  sending her home.  Continue aggressive secondary prevention.  Will plan  for a PCI next week after she has been Plavix loaded.      Corky Crafts, MD  Electronically Signed     JSV/MEDQ  D:  06/17/2006  T:  06/17/2006  Job:  [id number]

## 2010-06-24 NOTE — Cardiovascular Report (Signed)
NAMEGINEVRA, TACKER              ACCOUNT NO.:  [id number]   MEDICAL RECORD NO.:  [id number]          PATIENT TYPE:  OIB   LOCATION:  2807                         FACILITY:  [facility]   PHYSICIAN:  Corky Crafts, MDDATE OF BIRTH:  1955-02-29   DATE OF PROCEDURE:  06/22/2006  DATE OF DISCHARGE:                            CARDIAC CATHETERIZATION   PROCEDURES PERFORMED:  Percutaneous coronary intervention of the left  anterior descending.   OPERATOR:  Corky Crafts, MD   INDICATIONS:  Stable angina and diabetes.   PROCEDURE IN DETAIL:  The risks and benefits of angioplasty was  explained to the patient, and informed consent was obtained.  The  patient was brought to the cath lab.  She was prepped and draped in the  usual sterile fashion.  A 7-French arterial sheath was placed into her  right femoral artery in the modified Seldinger technique.  ACLS 3.5  guiding catheter was used to engage the ostium of the left main under  fluoroscopic guidance.  Angiomax was used for anticoagulation.  Several  guide shots were taken in multiple projections to confirm stenosis.  A  Prowater wire was advanced into the second diagonal.  A BMW wire was  advanced into the LAD.  A 2.5 x 12-mm Maverick was then deployed across  the lesion at 8 atmospheres for 30 seconds.  The 30 x 20-mm taxis stent  was then deployed across the lesion at 16 atmospheres for 35 seconds.  A  3.5 x 12-mm Quantum Maverick was then used to post dilate the entire  stent.  It was first deployed in the distal stent at 16 atmospheres for  30 seconds.  It was then deployed more proximally at 20 atmospheres for  25 seconds.  An IVUS was then performed which showed under-deployment of  the proximal stent.  A 3.75 x 8-mm power-cell balloon was then placed at  the proximal edge of the stent and deployed at 20 atmospheres for 30  seconds.  The balloon was then slightly advanced and it deployed 16  atmospheres for 20 seconds.  A  repeat IVUS was performed; the stent was  well apposed.  There was an excellent angiographic result.  There was no  residual stenosis.  There was TIMI-3 flow throughout the procedure.   IMPRESSION:  1. Successful percutaneous coronary intervention of the mid left      anterior descending at the bifurcation of the second diagonal.  2. Zero percent residual stenosis, TIMI-3 flow throughout.  3. Widely patent first and second diagonals; both of these vessels      were covered by the 20-mm stent in order to cover the entire      lesion.   RECOMMENDATIONS:  The patient should continue with aspirin 325 mg a day  and Plavix daily indefinitely.  Will monitor her overnight and plan for  discharge tomorrow if there are no other issues.  Her sheath will be  removed using manual compression.     Corky Crafts, MD    JSV/MEDQ  D:  06/22/2006  T:  06/22/2006  Job:  [id number]  cc:   Judithann Sauger, M.D.

## 2010-06-24 NOTE — Discharge Summary (Signed)
[name], [name]              ACCOUNT NO.:  [id number]   MEDICAL RECORD NO.:  [id number]          PATIENT TYPE:  INP   LOCATION:  6533                         FACILITY:  [facility]   PHYSICIAN:  Corky Crafts, MDDATE OF BIRTH:  07-24-1955   DATE OF ADMISSION:  06/22/2006  DATE OF DISCHARGE:  06/23/2006                               DISCHARGE SUMMARY   DISCHARGE DIAGNOSES:  1. Coronary artery disease.  Status post percutaneous intervention      with a drug-eluding stent on Jun 22, 2006.  Under the care of Dr.      Everette Rank.  2. Diabetes mellitus, oral agents.  3. Hyperlipidemia.  Treated.  4. Hypertension.  Treated.  5. Gastroesophageal reflux disease.  6. Obesity.   [name] is a 55 year old female who was initially brought in for  cardiac catheterization on Jun 17, 2006, following complaints of  shortness of breath.  She was found to have a 90% LAD lesion.  On Jun 22, 2006, she was brought in for  intervention and a Taxus stent was  deployed.  The patient tolerated the procedure well and remained in the  hospital overnight, and was ready for discharge to home the following  day.   LABS ON DISCHARGE:  Show a BUN of 9, creatinine 0.69.   DISCHARGE MEDICATIONS:  1. Plavix 75 mg one a day.  2. Enteric-coated aspirin 325 mg one a day.  3. Sublingual nitroglycerin p.r.n. chest pain.  4. Lasix 40 mg twice a day.  5. Protonix 40 mg daily as needed.  6. Vytorin 10/40 one a day.  7. Benicar/HCTZ 40/25 mg one tablet daily.  8. Potassium 20 meq daily.  9. Glimepiride 4 mg daily.   She is not to re-start her metformin until Jun 25, 2006.  Remain on a  heart-healthy diabetic diet.  Do not scrub catheterization area, clean  the area gently with soap and water.  No lifting over 10 pounds for one  week.  No driving for 2 days.  Follow up with Dr. Eldridge Dace on Jul 06, 2006 at 10:30 a.m.   The patient is discharged home in stable condition.      Guy Franco,  P.A.      Corky Crafts, MD  Electronically Signed    LB/MEDQ  D:  06/23/2006  T:  06/23/2006  Job:  [id number]   cc:   Dario Guardian, M.D.

## 2010-06-26 ENCOUNTER — Ambulatory Visit (HOSPITAL_COMMUNITY)
Admission: RE | Admit: 2010-06-26 | Discharge: 2010-06-26 | Disposition: A | Discharge status: Home / Self Care | Payer: 59 | Source: Ambulatory Visit | Attending: Interventional Cardiology | Admitting: Interventional Cardiology

## 2010-06-26 DIAGNOSIS — I2510 Atherosclerotic heart disease of native coronary artery without angina pectoris: Secondary | ICD-10-CM | POA: Insufficient documentation

## 2010-06-26 DIAGNOSIS — Z9861 Coronary angioplasty status: Secondary | ICD-10-CM | POA: Insufficient documentation

## 2010-06-26 DIAGNOSIS — R0602 Shortness of breath: Secondary | ICD-10-CM | POA: Insufficient documentation

## 2010-06-27 NOTE — Cardiovascular Report (Signed)
  NAMESHAROL, CROGHAN              ACCOUNT NO.:  [id number]  MEDICAL RECORD NO.:  [id number]           PATIENT TYPE:  O  LOCATION:  6522                         FACILITY:  [facility]  PHYSICIAN:  Corky Crafts, MDDATE OF BIRTH:  Jul 03, 1955  DATE OF PROCEDURE:  06/26/2010 DATE OF DISCHARGE:  06/26/2010                           CARDIAC CATHETERIZATION   PRIMARY CARE PHYSICIAN:  Dario Guardian, MD  PROCEDURES PERFORMED: 1. Left heart catheterization. 2. Left ventriculogram. 3. Coronary angiogram. 4. Abdominal aortogram.  OPERATOR:  Corky Crafts, MD  INDICATIONS:  Prior coronary artery disease, worsening shortness of breath which was her anginal equivalent in the past.  PROCEDURE NARRATIVE:  The risks and benefits of cardiac cath were explained to the patient.  Informed consent was obtained.  She was brought to the cath lab.  She was prepped and draped in usual sterile fashion.  Her right wrist was infiltrated with 1% lidocaine.  A 5-French glide sheath was placed in to the right radial artery using modified Seldinger technique.  Right coronary artery angiography was performed using a JR-4.0 catheter.  The catheter was advanced to the vessel ostium under fluoroscopic guidance.  Digital angiography was performed in multiple projections using hand injection of contrast.  It was difficult to engage the RCA with this catheter and a No-Torque Right may work better if necessary in the future. The left main coronary artery is widely patent. The left circumflex is a large vessel.  There is an OM-1 and OM-2 which are medium-sized, both of which are widely patent. The left anterior descending is a large vessel.  The stent in the mid vessel appears widely patent.  The first and second diagonals are also widely patent.  There is minimal disease in the mid-to-distal LAD. The left ventriculogram shows normal ventricular function with an ejection fraction of 60%.  HEMODYNAMICS:   LV pressure 150/0 with an LVEDP of 15 mmHg.  Aortic pressure 148/86 with mean aortic pressure of 112 mmHg. Abdominal aortogram was performed which showed no abdominal aortic aneurysm.  There is no evidence of renal artery stenosis.  The sheath was removed and a TR band was placed for hemostasis.  Heparin was used during the procedure.  IMPRESSION: 1. Patent left anterior descending coronary artery stent.  No     significant coronary artery disease. 2. Normal left ventricular function. 3. No abdominal aortic aneurysm or evidence of renal artery stenosis.  RECOMMENDATIONS:  Continue medical therapy and attempts at weight loss. She will be watched for a few hours and then sent home later today.     Corky Crafts, MD     JSV/MEDQ  D:  06/26/2010  T:  06/26/2010  Job:  [id number]  Electronically Signed by Lance Muss MD on 06/27/2010 08:21:14 AM

## 2010-06-27 NOTE — Op Note (Signed)
[name], [name]              ACCOUNT NO.:  [id number]   MEDICAL RECORD NO.:  [id number]          PATIENT TYPE:  AMB   LOCATION:  SDC                           FACILITY:  [facility]   PHYSICIAN:  Miguel Aschoff, M.D.       DATE OF BIRTH:  [birth year]   DATE OF PROCEDURE:  DATE OF DISCHARGE:                                 OPERATIVE REPORT   PREOPERATIVE DIAGNOSES:  1.  Menorrhagia.  2.  History of uterine fibroids.   POSTOPERATIVE DIAGNOSES:  1.  Menorrhagia.  2.  History of uterine fibroids.   PROCEDURE:  1.  Cervical dilatation.  2.  Hysteroscopy.  3.  Uterine curettage.  4.  Thermachoice endometrial ablation.   ANESTHESIA:  General.   COMPLICATIONS:  None.   JUSTIFICATION:  The patient is a 55 year old black female with history of  menorrhagia and noted on ultrasound to have uterine fibroids with a question  of submucous component.  The patient has had a history of very heavy menses.  She presents now to undergo hysteroscopy, D&C, and endometrial ablation in  an effort to control her heavy menses.  Risks and benefits of the procedure  were discussed with the patient.   PROCEDURE:  The patient was taken to the operating room and placed in a  supine position.  General anesthesia was administered without difficulty.  She was then placed in the dorsal lithotomy position and prepped and draped  in the usual sterile fashion.  The bladder was catheterized and examination  under anesthesia revealed normal external genitalia, normal Bartholin's and  Skene's glands, normal urethra.  The vaginal vault revealed a second degree  rectocele, second degree cystocele.  No cervical lesions were noted.  The  uterus was noted to be anterior, approximately 8 to 10 weeks equivalent  size.  No adnexal masses were noted.  At this point, the speculum was placed  in the vaginal vault.  The anterior cervical lip was grasped by the  tenaculum.  The uterus was sounded to 11 cm.  Following this, the  endometrial canal was dilated using serial Pratt dilators.  Once a 25 Pratt  dilator could be passed, the diagnostic hysteroscope was advanced through  the endocervix.  No endocervical lesions were noted.  The endometrial cavity  was then inspected.  No submucous myomas were noted nor were there any  endometrial polyps.  At this point, the hysteroscope was removed and  vigorous sharp curettage was carried out using a medium serrated curette.  Tissue was removed and sent for histologic study.  Following the curettage,  the Thermachoice endometrial ablation balloon was primed and advanced  through the cervix and an 8-miute treatment cycle at 87 degrees was carried  out under 165 mmHg.  The treatment cycle was completed successfully.  At  this point, the unit was allowed to cool.  The ablation unit removed intact.  All instruments were removed intact.  All instruments were removed and the  procedure completed.  The patient is being taken to the recovery room in  stable condition.  The estimated blood loss was approximately 30  mL.   The plan is for the patient to be discharged home.  Medications for home  will be Darvocet-N 100, one q.4h. as needed for pain, and doxycycline 100 mg  b.i.d. x3 days.  The patient is to call for any problems such as fever,  pain, or heavy bleeding.  The patient will be seen back in 4 weeks for  follow-up examination.      Miguel Aschoff, M.D.  Electronically Signed     AR/MEDQ  D:  01/29/2005  T:  01/29/2005  Job:  [id number]

## 2010-10-02 ENCOUNTER — Inpatient Hospital Stay (HOSPITAL_COMMUNITY): Payer: 59

## 2010-10-02 ENCOUNTER — Emergency Department (HOSPITAL_COMMUNITY): Payer: 59

## 2010-10-02 ENCOUNTER — Observation Stay (HOSPITAL_COMMUNITY)
Admission: EM | Admit: 2010-10-02 | Discharge: 2010-10-03 | DRG: 392 | Disposition: A | Discharge status: Home / Self Care | Payer: 59 | Attending: Interventional Cardiology | Admitting: Interventional Cardiology

## 2010-10-02 DIAGNOSIS — E119 Type 2 diabetes mellitus without complications: Secondary | ICD-10-CM | POA: Insufficient documentation

## 2010-10-02 DIAGNOSIS — G4733 Obstructive sleep apnea (adult) (pediatric): Secondary | ICD-10-CM | POA: Insufficient documentation

## 2010-10-02 DIAGNOSIS — R079 Chest pain, unspecified: Secondary | ICD-10-CM | POA: Insufficient documentation

## 2010-10-02 DIAGNOSIS — K219 Gastro-esophageal reflux disease without esophagitis: Secondary | ICD-10-CM | POA: Insufficient documentation

## 2010-10-02 DIAGNOSIS — R1013 Epigastric pain: Principal | ICD-10-CM | POA: Insufficient documentation

## 2010-10-02 DIAGNOSIS — I2510 Atherosclerotic heart disease of native coronary artery without angina pectoris: Secondary | ICD-10-CM | POA: Insufficient documentation

## 2010-10-02 DIAGNOSIS — J45909 Unspecified asthma, uncomplicated: Secondary | ICD-10-CM | POA: Insufficient documentation

## 2010-10-02 DIAGNOSIS — I10 Essential (primary) hypertension: Secondary | ICD-10-CM | POA: Insufficient documentation

## 2010-10-02 LAB — BASIC METABOLIC PANEL
Calcium: 9.5 mg/dL (ref 8.4–10.5)
Creatinine, Ser: 0.64 mg/dL (ref 0.50–1.10)
GFR calc non Af Amer: >60 mL/min (ref >60)
Sodium: 136 mEq/L (ref 135–145)

## 2010-10-02 LAB — GLUCOSE, CAPILLARY
Glucose-Capillary: 252 mg/dL — ABNORMAL HIGH (ref 70–99)
Glucose-Capillary: 156 mg/dL — ABNORMAL HIGH (ref 70–99)

## 2010-10-02 LAB — HEPATIC FUNCTION PANEL
AST: 17 U/L (ref 0–37)
Albumin: 3.7 g/dL (ref 3.5–5.2)
Total Protein: 7.9 g/dL (ref 6.0–8.3)

## 2010-10-02 LAB — CARDIAC PANEL(CRET KIN+CKTOT+MB+TROPI)
CK, MB: 2 ng/mL (ref 0.3–4.0)
Relative Index: INVALID (ref 0.0–2.5)

## 2010-10-02 LAB — CK TOTAL AND CKMB (NOT AT ARMC): Relative Index: 1.9 (ref 0.0–2.5)

## 2010-10-02 LAB — HEMOGLOBIN A1C
Hgb A1c MFr Bld: 8.3 % — ABNORMAL HIGH (ref <5.7)
Mean Plasma Glucose: 192 mg/dL — ABNORMAL HIGH (ref <117)

## 2010-10-02 LAB — D-DIMER, QUANTITATIVE: D-Dimer, Quant: 0.44 ug/mL-FEU (ref 0.00–0.48)

## 2010-10-02 LAB — CBC
HCT: 38 % (ref 36.0–46.0)
MCH: 27.3 pg (ref 26.0–34.0)
MCV: 76.3 fL — ABNORMAL LOW (ref 78.0–100.0)
RBC: 4.98 MIL/uL (ref 3.87–5.11)
RDW: 14.5 % (ref 11.5–15.5)
WBC: 11.7 10*3/uL — ABNORMAL HIGH (ref 4.0–10.5)

## 2010-10-02 LAB — DIFFERENTIAL
Basophils Absolute: 0 10*3/uL (ref 0.0–0.1)
Basophils Relative: 0 % (ref 0–1)
Eosinophils Absolute: 0.1 10*3/uL (ref 0.0–0.7)
Eosinophils Relative: 1 % (ref 0–5)
Monocytes Absolute: 0.7 10*3/uL (ref 0.1–1.0)
Neutro Abs: 8.2 10*3/uL — ABNORMAL HIGH (ref 1.7–7.7)
Neutrophils Relative %: 70 % (ref 43–77)

## 2010-10-02 LAB — TSH: TSH: 0.741 u[IU]/mL (ref 0.350–4.500)

## 2010-10-02 LAB — PRO B NATRIURETIC PEPTIDE: Pro B Natriuretic peptide (BNP): 42.7 pg/mL (ref 0–125)

## 2010-10-02 IMAGING — US US ABDOMEN COMPLETE
1 series · 14 of 25 positions shown · non-contrast
Comparison: None

CLINICAL DATA: Abdominal pain.

ABDOMEN ULTRASOUND
TECHNIQUE: Complete abdominal ultrasound examination was performed
including evaluation of the liver, gallbladder, bile ducts,
pancreas, kidneys, spleen, IVC, and abdominal aorta.

[Series 1: us abdomen complete · 0.34mm/px · 14 of 63 slices shown]
[im 1/63]
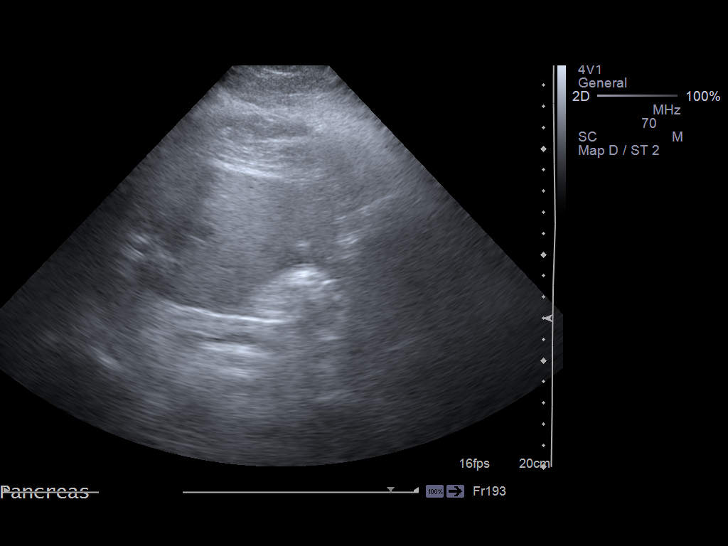
[im 6/63]
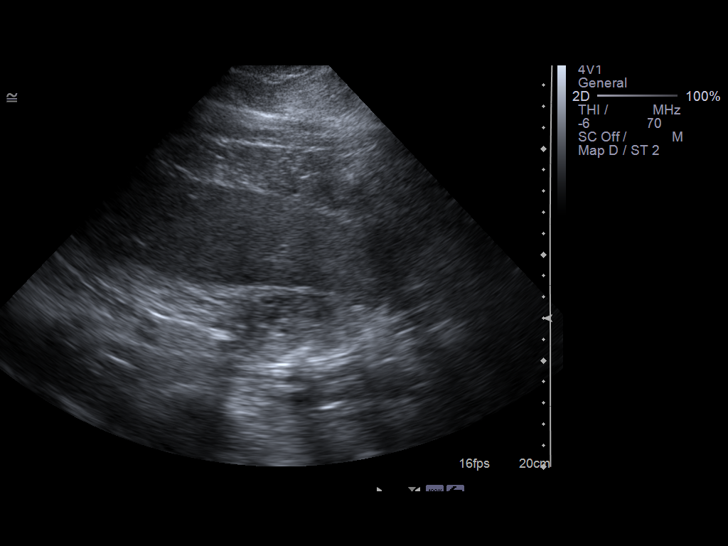
[im 11/63]
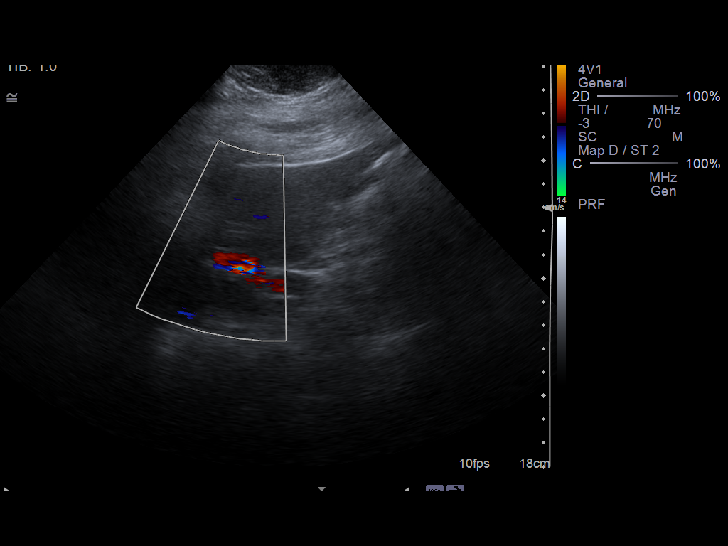
[im 16/63]
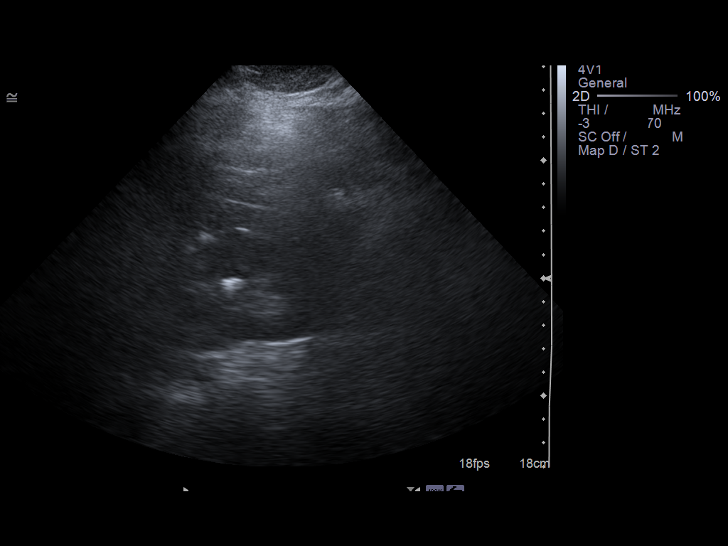
[im 21/63]
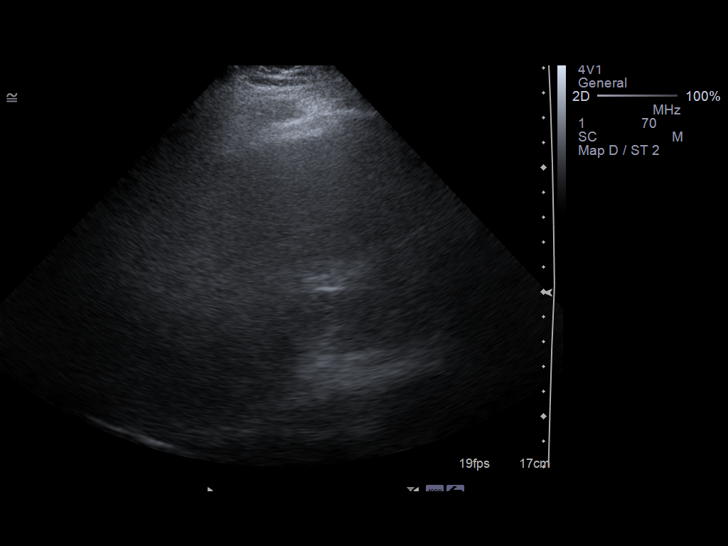
[im 24/63]
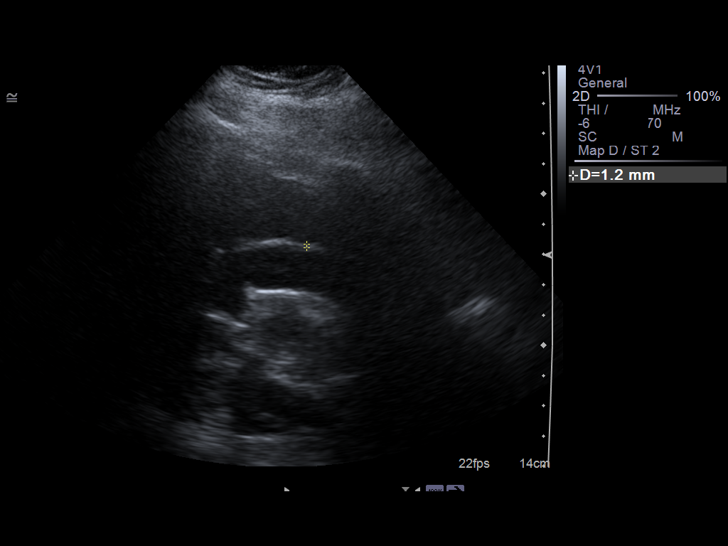
[im 29/63]
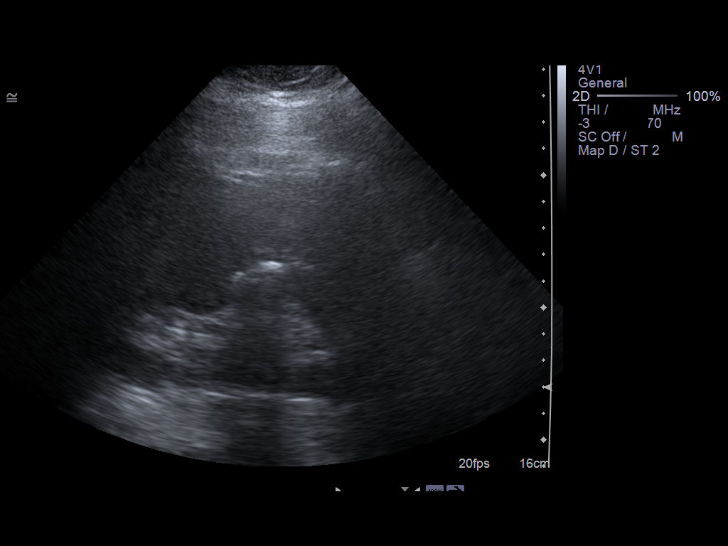
[im 34/63]
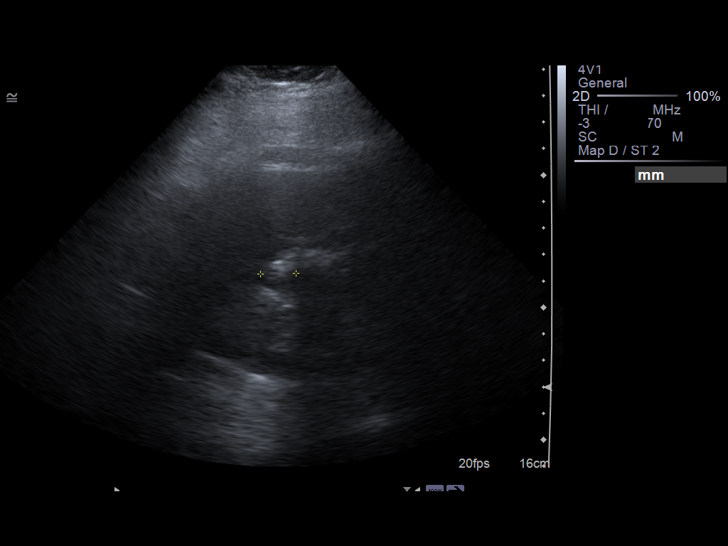
[im 39/63]
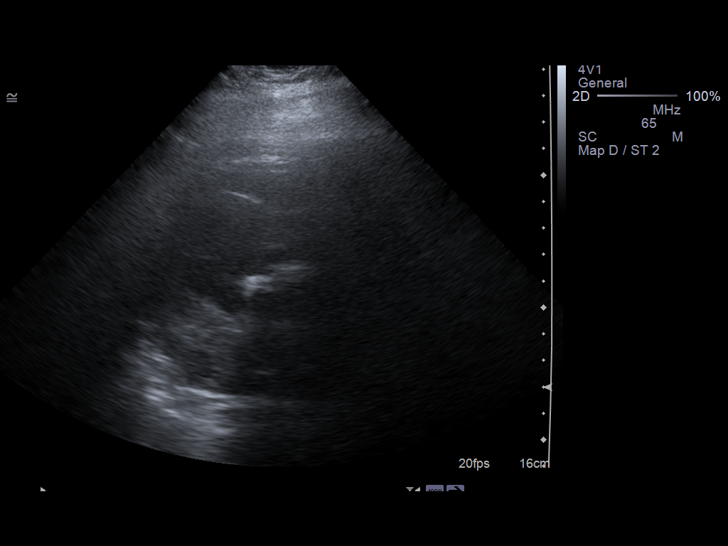
[im 42/63]
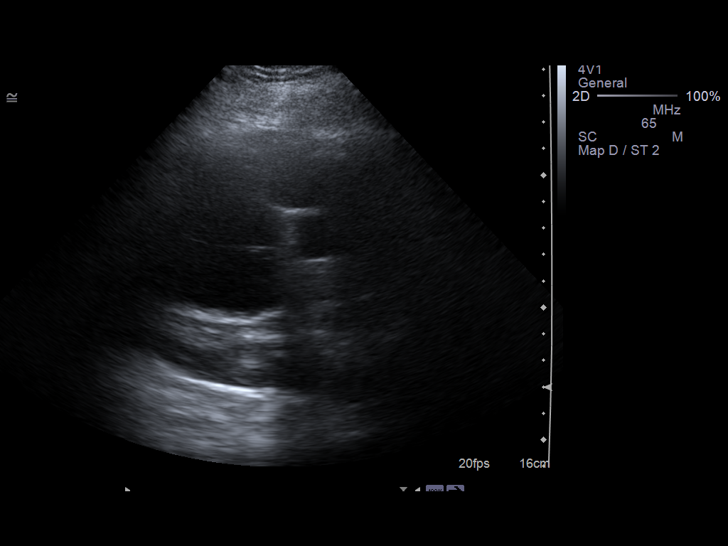
[im 47/63]
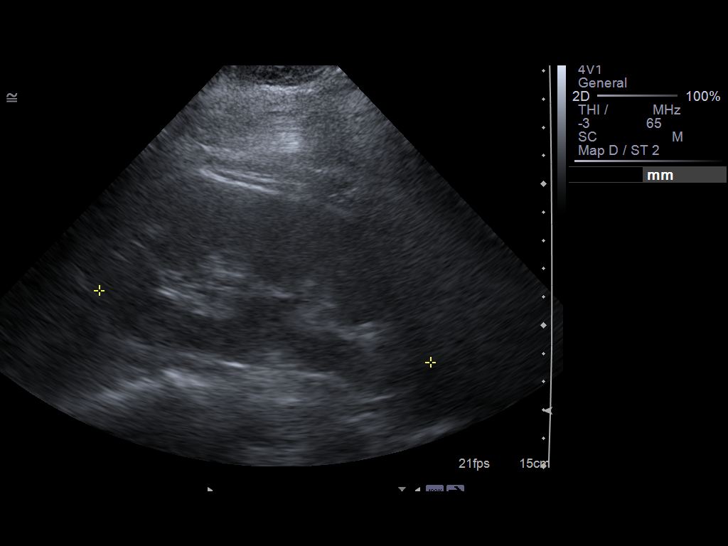
[im 52/63]
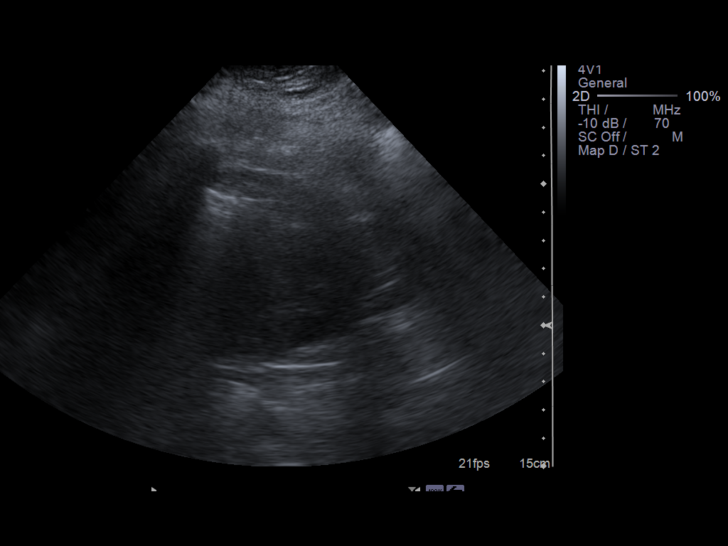
[im 57/63]
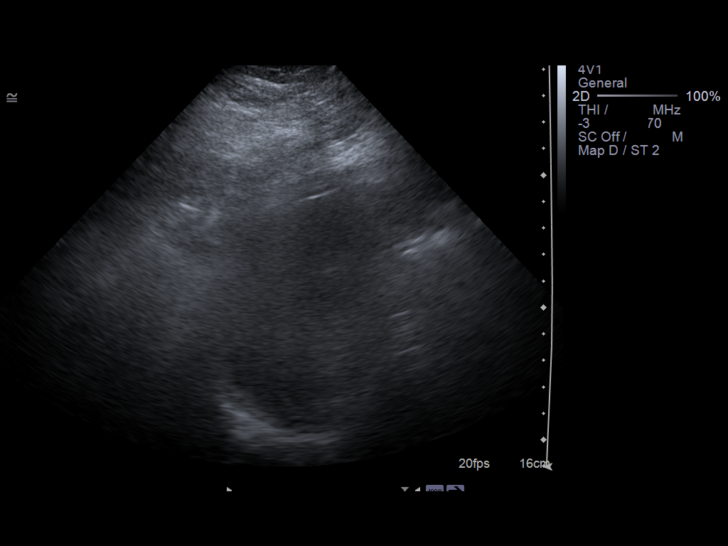
[im 63/63]
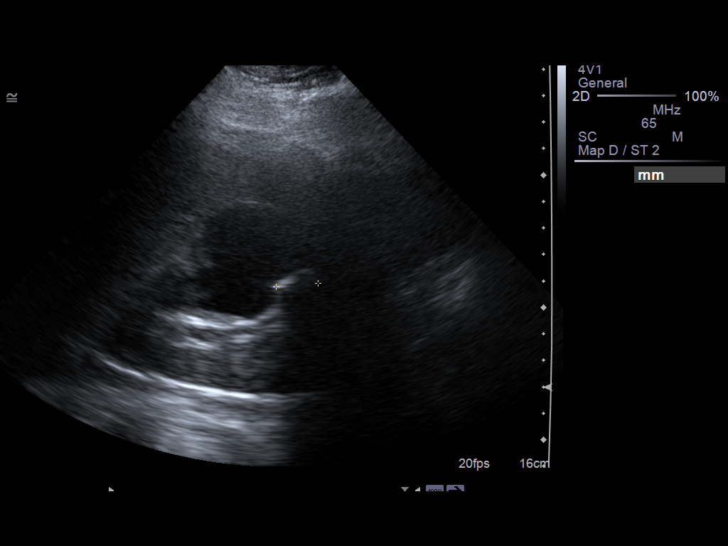

[14 of 25 positions shown; findings below may reference images not displayed]

FINDINGS: Gallbladder:  The gallbladder is very difficult to visualize and is
likely contracted. There is subjective tenderness over the expected
position of the gallbladder.

Common Bile Duct:  Normal caliber of 5 mm.

Liver:  Difficult to fully penetrate by ultrasound.  No overt
abnormalities.

IVC:  Patent throughout its visualized course in the abdomen.

Pancreas:  Limited visualization of pancreas.

Spleen:  Normal echotexture and size.

Kidneys:  The right kidney measures 10.2 cm and the left kidney 12
cm.  Several renal cysts are present on the right.  No
hydronephrosis.

Abdominal Aorta:  The abdominal aorta is normal in caliber along
its visualized segments.
IMPRESSION: Tenderness over region of gallbladder.  Gallbladder itself is very
difficult to visualize.  Cholecystitis cannot be excluded.

## 2010-10-02 IMAGING — CR DG CHEST 1V PORT
1 series · 1 of 1 positions shown · non-contrast
Comparison: Chest radiograph and CTA of the chest performed
[DATE]

CLINICAL DATA: Shortness of breath and mid chest pain.

PORTABLE CHEST - 1 VIEW

[view not recorded]
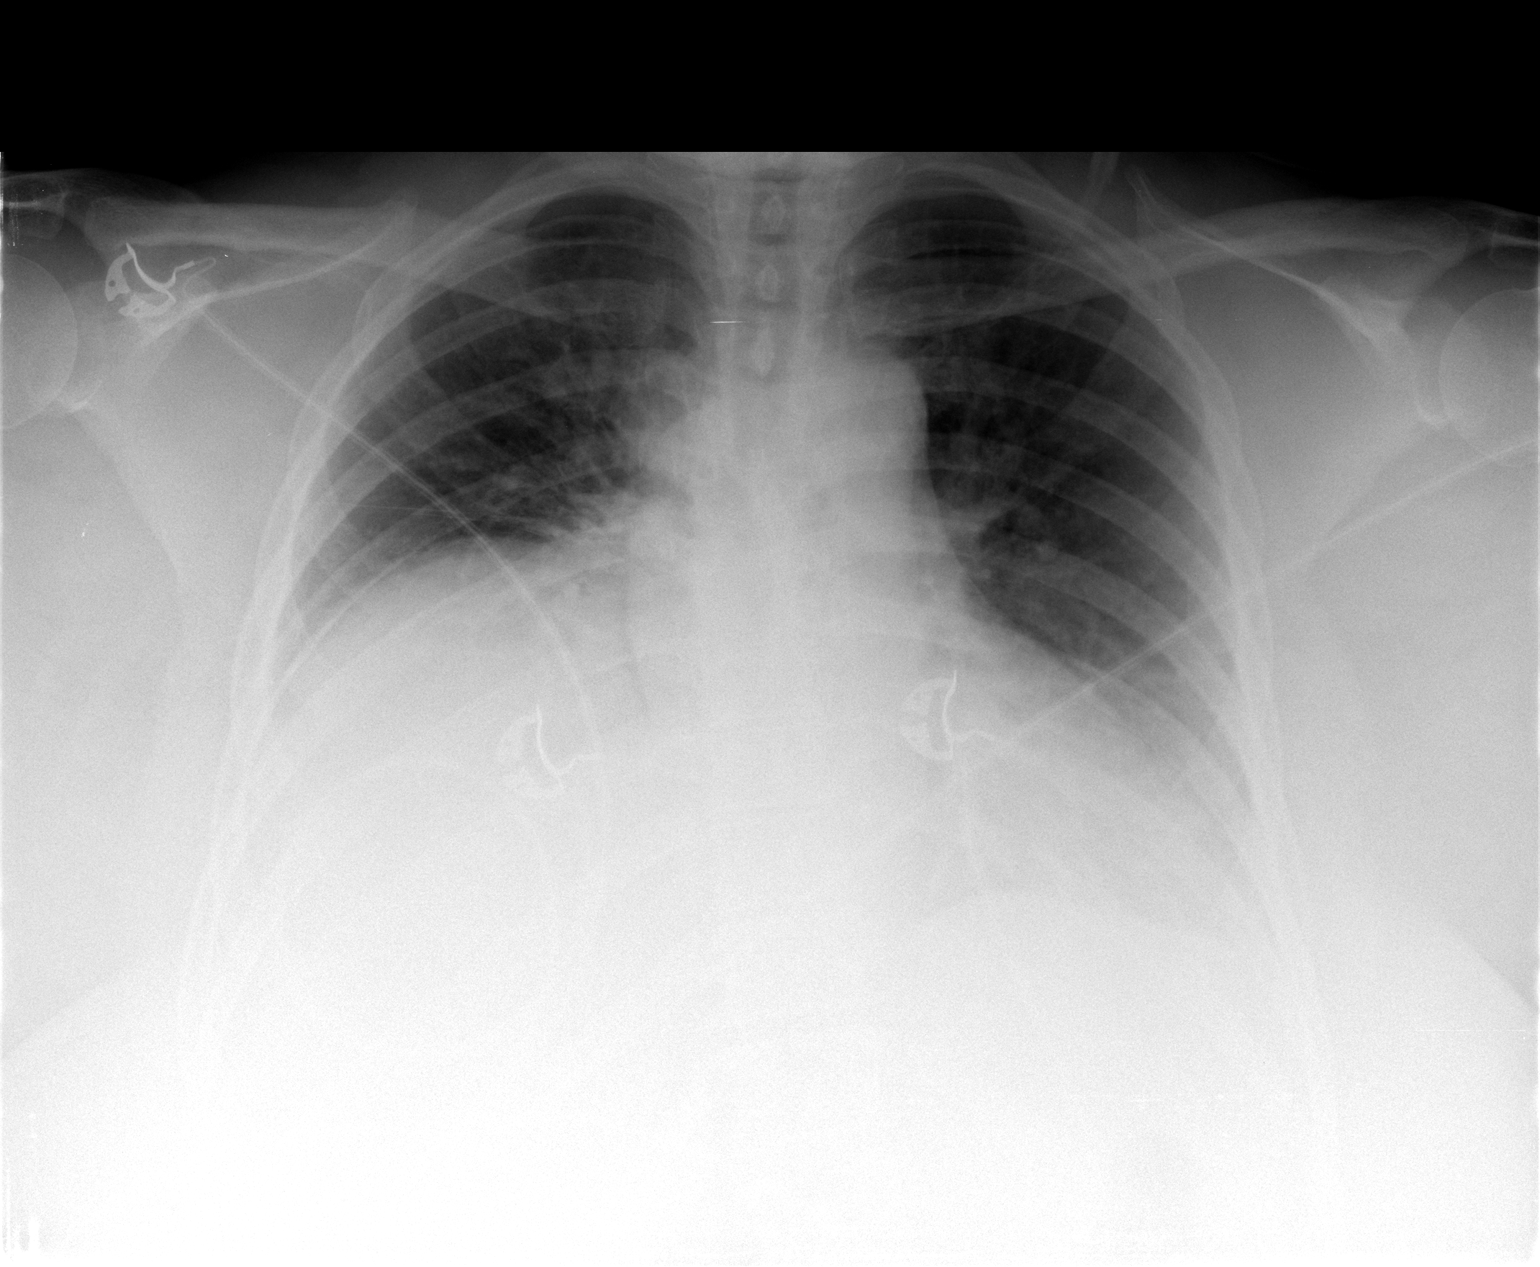

[1 of 1 positions shown; findings below may reference images not displayed]

FINDINGS: The lungs are hypoexpanded.  There is persistent
elevation of the right hemidiaphragm, with bibasilar atelectasis.
Mild vascular congestion is noted, without evidence for pulmonary
edema.  No pleural effusion or pneumothorax is seen.

The cardiomediastinal silhouette is borderline normal in size.  No
acute osseous abnormalities are seen.
IMPRESSION: 1.  Lungs hypoexpanded; persistent elevation of the right
hemidiaphragm, with bibasilar atelectasis.
2.  Mild vascular congestion, without evidence for pulmonary edema.

## 2010-10-03 ENCOUNTER — Inpatient Hospital Stay (HOSPITAL_COMMUNITY): Payer: 59

## 2010-10-03 DIAGNOSIS — R109 Unspecified abdominal pain: Secondary | ICD-10-CM

## 2010-10-03 LAB — GLUCOSE, CAPILLARY
Glucose-Capillary: 175 mg/dL — ABNORMAL HIGH (ref 70–99)
Glucose-Capillary: 75 mg/dL (ref 70–99)

## 2010-10-03 IMAGING — NM NM HEPATOBILIARY IMAGE, INC GB
1 series · 6 of 6 positions shown · non-contrast
Comparison: Ultrasound dated [DATE]

CLINICAL DATA: Abdominal pain, evaluate for cholecystitis

NUCLEAR MEDICINE HEPATOHBILIARY INCLUDE GB
Radiopharmaceutical: 5 mCi technetium 99m Choletec IV

[he hepatobiliary · 3.43mm/px · 6 of 40 frames shown]
[frame 4/40]
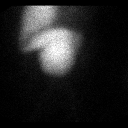
[frame 10/40]
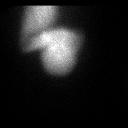
[frame 17/40]
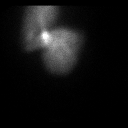
[frame 24/40]
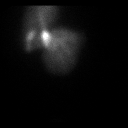
[frame 30/40]
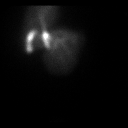
[frame 37/40]
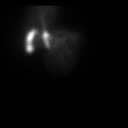

[6 of 6 positions shown; findings below may reference images not displayed]

FINDINGS: Gallbladder fills with radiotracer within 30-35 minutes.
Subsequent filling of the small bowel with 40-45 minutes.

These findings do not suggest acute cholecystitis.
IMPRESSION: No evidence of acute cholecystitis.

## 2010-10-03 IMAGING — NM NM HEPATOBILIARY IMAGE, INC GB
3 series · 8 of 8 positions shown · non-contrast
Comparison: Ultrasound dated [DATE]

CLINICAL DATA: Abdominal pain, evaluate for cholecystitis

NUCLEAR MEDICINE HEPATOHBILIARY INCLUDE GB
Radiopharmaceutical: 5 mCi technetium 99m Choletec IV

[he hepatobiliary · 3.43mm/px · 6 of 52 frames shown (1 of 3)]
[frame 5/52]
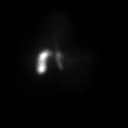
[frame 13/52]
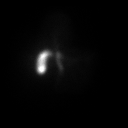
[frame 22/52]
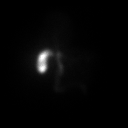
[frame 31/52]
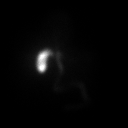
[frame 39/52]
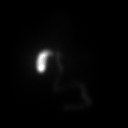
[frame 48/52]
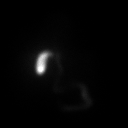

[he hepatobiliary · 2.51mm/px · 1 of 1 slices shown (2 of 3)]
[im 1/1]
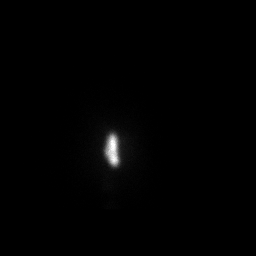

[he hepatobiliary · 1 of 1 slices shown (3 of 3)]
[im 1/1]
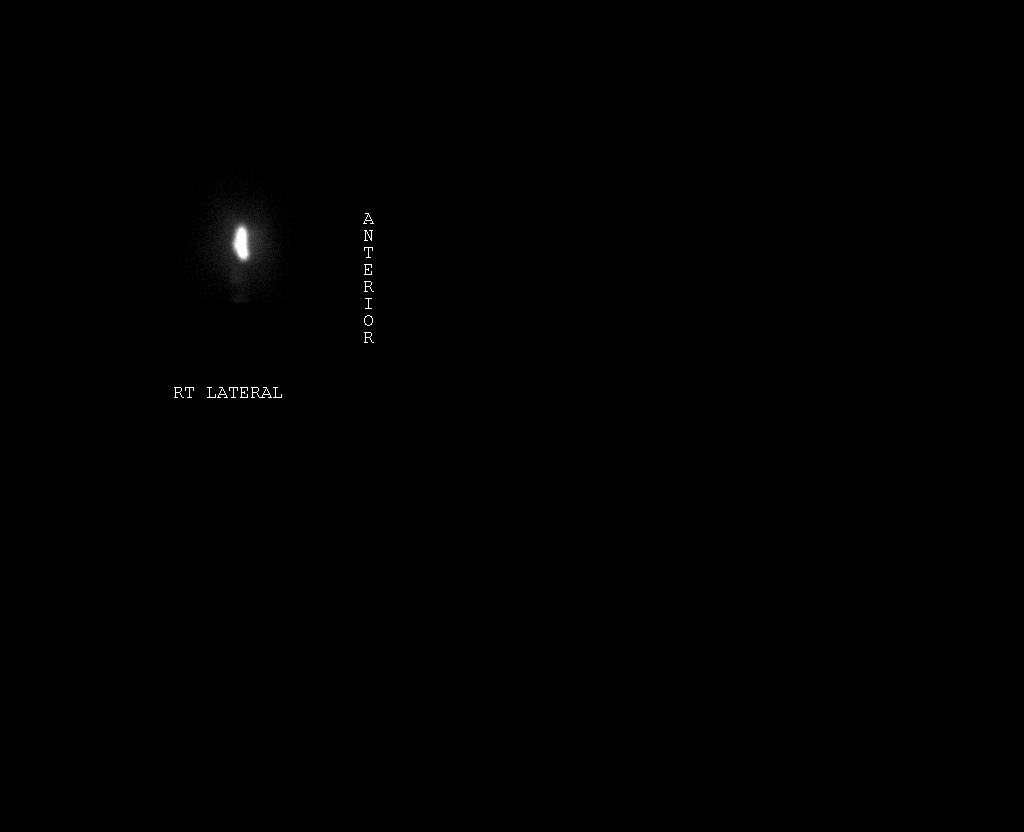

[8 of 8 positions shown; findings below may reference images not displayed]

FINDINGS: Gallbladder fills with radiotracer within 30-35 minutes.
Subsequent filling of the small bowel with 40-45 minutes.

These findings do not suggest acute cholecystitis.
IMPRESSION: No evidence of acute cholecystitis.

## 2010-10-03 MED ORDER — TECHNETIUM TC 99M MEBROFENIN IV KIT
5.0000 | PACK | Freq: Once | INTRAVENOUS | Status: AC | PRN
  Administered 2010-10-03: 5 via INTRAVENOUS

## 2010-10-07 ENCOUNTER — Emergency Department (HOSPITAL_COMMUNITY)
Admission: EM | Admit: 2010-10-07 | Discharge: 2010-10-07 | Disposition: A | Discharge status: Home / Self Care | Payer: 59 | Attending: Emergency Medicine | Admitting: Emergency Medicine

## 2010-10-07 ENCOUNTER — Emergency Department (HOSPITAL_COMMUNITY): Payer: 59

## 2010-10-07 DIAGNOSIS — E119 Type 2 diabetes mellitus without complications: Secondary | ICD-10-CM | POA: Insufficient documentation

## 2010-10-07 DIAGNOSIS — J45909 Unspecified asthma, uncomplicated: Secondary | ICD-10-CM | POA: Insufficient documentation

## 2010-10-07 DIAGNOSIS — R1013 Epigastric pain: Secondary | ICD-10-CM | POA: Insufficient documentation

## 2010-10-07 DIAGNOSIS — M79609 Pain in unspecified limb: Secondary | ICD-10-CM | POA: Insufficient documentation

## 2010-10-07 DIAGNOSIS — M545 Low back pain, unspecified: Secondary | ICD-10-CM | POA: Insufficient documentation

## 2010-10-07 DIAGNOSIS — R079 Chest pain, unspecified: Secondary | ICD-10-CM | POA: Insufficient documentation

## 2010-10-07 DIAGNOSIS — I10 Essential (primary) hypertension: Secondary | ICD-10-CM | POA: Insufficient documentation

## 2010-10-07 DIAGNOSIS — E785 Hyperlipidemia, unspecified: Secondary | ICD-10-CM | POA: Insufficient documentation

## 2010-10-07 DIAGNOSIS — Z79899 Other long term (current) drug therapy: Secondary | ICD-10-CM | POA: Insufficient documentation

## 2010-10-07 DIAGNOSIS — Z794 Long term (current) use of insulin: Secondary | ICD-10-CM | POA: Insufficient documentation

## 2010-10-07 DIAGNOSIS — Z7982 Long term (current) use of aspirin: Secondary | ICD-10-CM | POA: Insufficient documentation

## 2010-10-07 DIAGNOSIS — Z9889 Other specified postprocedural states: Secondary | ICD-10-CM | POA: Insufficient documentation

## 2010-10-07 DIAGNOSIS — I2510 Atherosclerotic heart disease of native coronary artery without angina pectoris: Secondary | ICD-10-CM | POA: Insufficient documentation

## 2010-10-07 DIAGNOSIS — R209 Unspecified disturbances of skin sensation: Secondary | ICD-10-CM | POA: Insufficient documentation

## 2010-10-07 LAB — BASIC METABOLIC PANEL
CO2: 30 mEq/L (ref 19–32)
Calcium: 10.2 mg/dL (ref 8.4–10.5)
GFR calc non Af Amer: >60 mL/min (ref >60)
Sodium: 139 mEq/L (ref 135–145)

## 2010-10-07 LAB — URINALYSIS, ROUTINE W REFLEX MICROSCOPIC
Bilirubin Urine: NEGATIVE
Hgb urine dipstick: NEGATIVE
Ketones, ur: NEGATIVE mg/dL
Protein, ur: NEGATIVE mg/dL
Urobilinogen, UA: 0.2 mg/dL (ref 0.0–1.0)

## 2010-10-07 LAB — GLUCOSE, CAPILLARY: Glucose-Capillary: 95 mg/dL (ref 70–99)

## 2010-10-07 LAB — CK: Total CK: 271 U/L — ABNORMAL HIGH (ref 7–177)

## 2010-10-07 IMAGING — CR DG LUMBAR SPINE COMPLETE 4+V
5 series · 5 of 5 positions shown · non-contrast
Comparison: None.

CLINICAL DATA: Lower back pain, leg numbness/tingling

LUMBAR SPINE - COMPLETE 4+ VIEW

[t l-spine a.p. *]
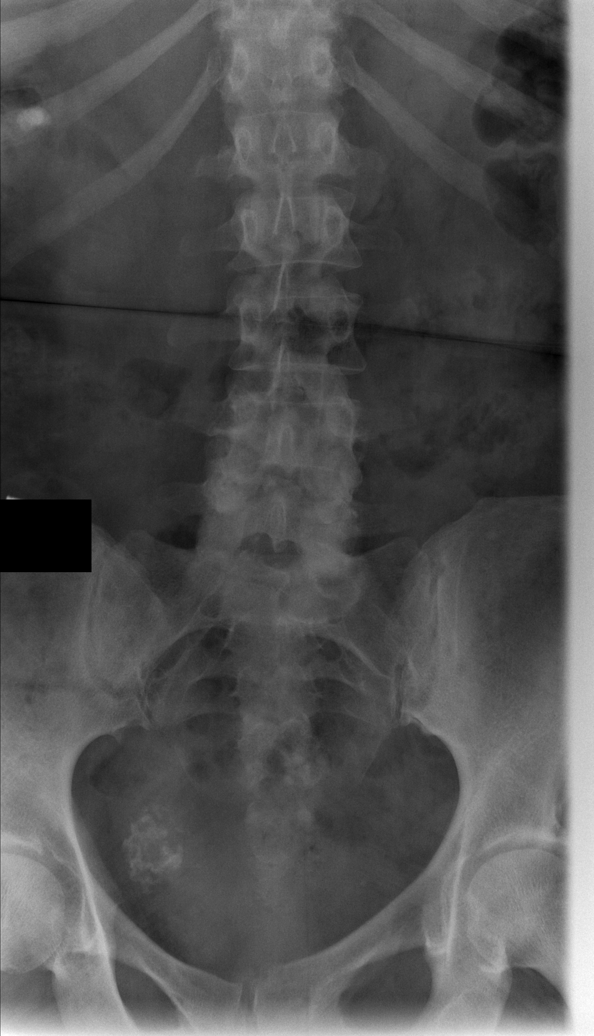

[t l-spine oblique exposure * (1 of 2)]
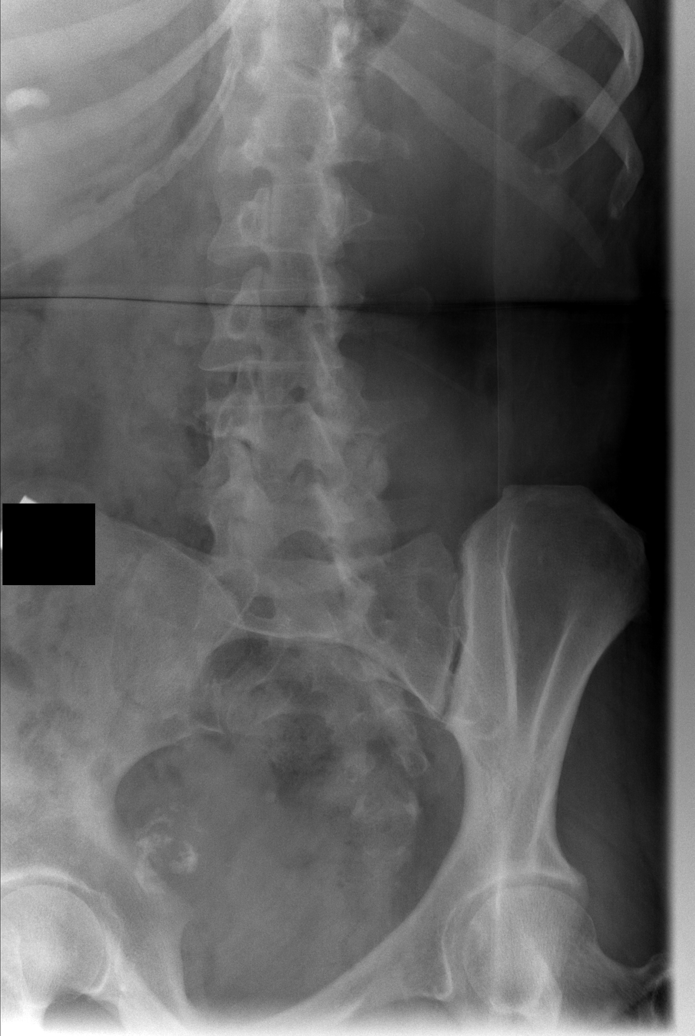

[t l-spine oblique exposure * (2 of 2)]
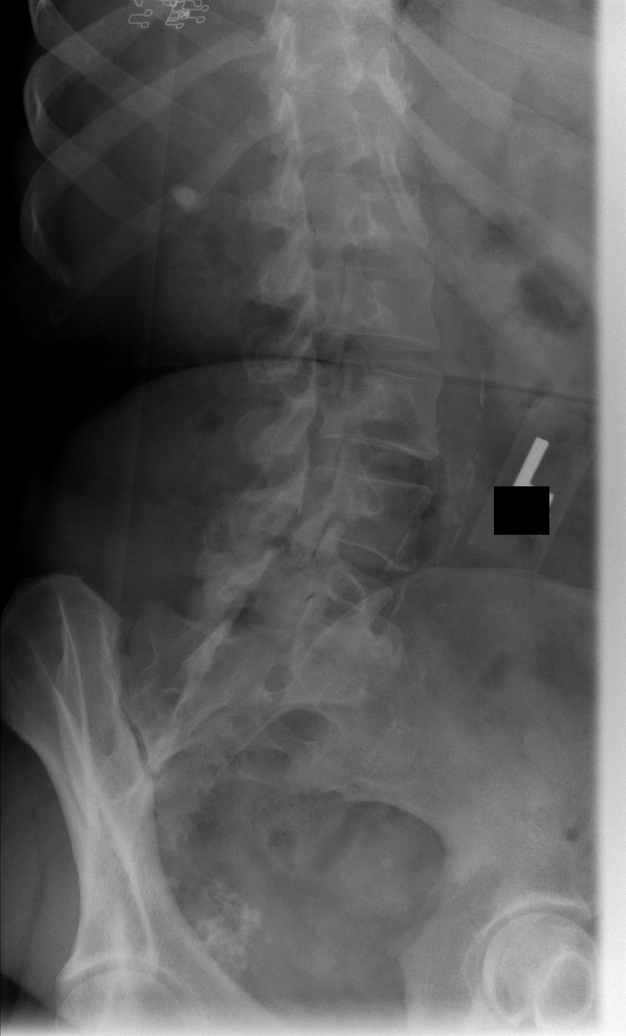

[t l-spine lat *]
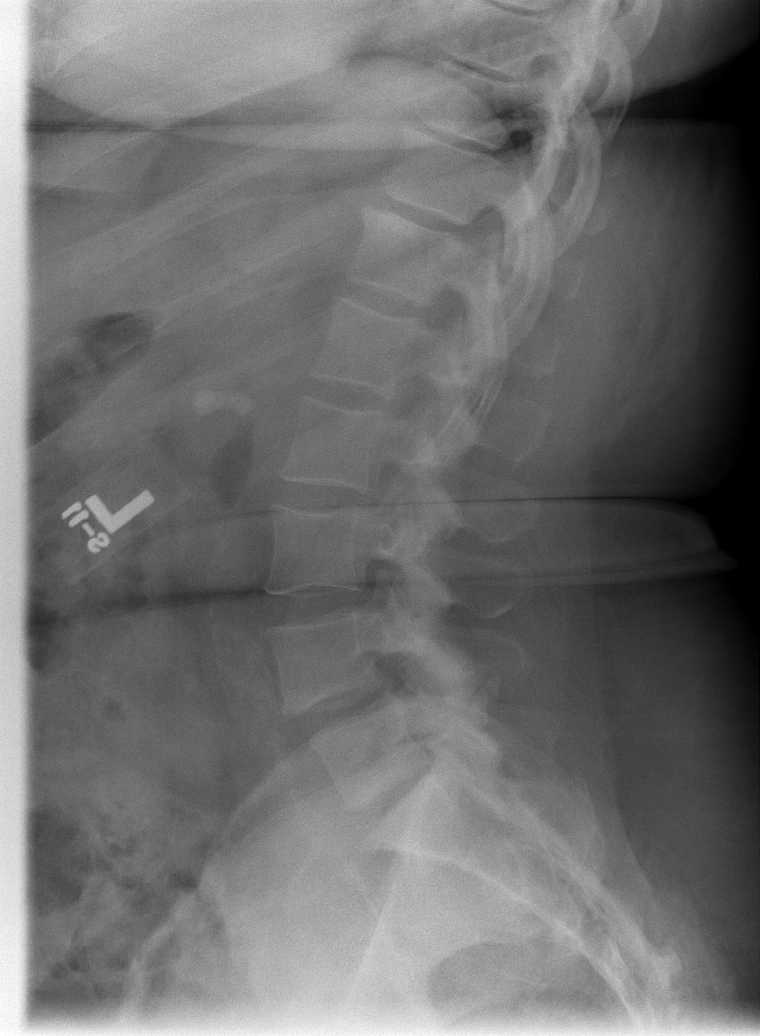

[t l-spine l5-s1 spot]
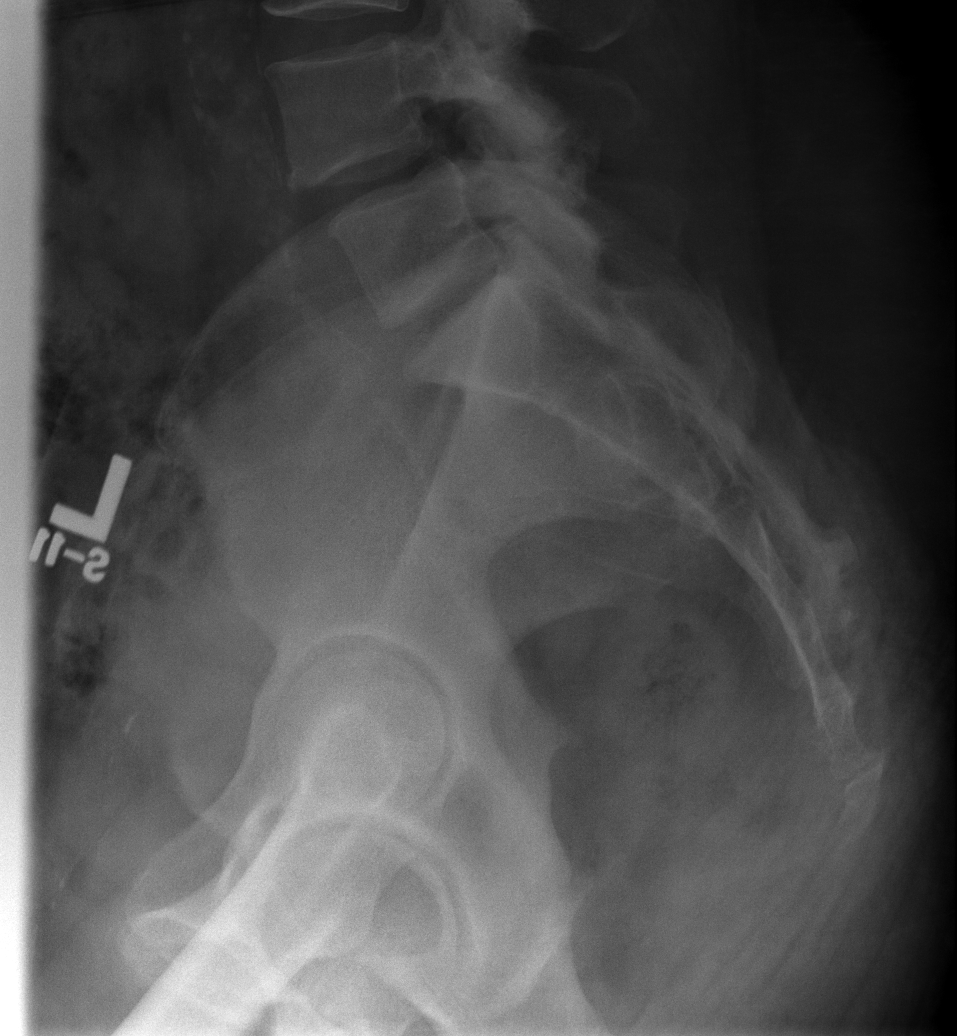

[5 of 5 positions shown; findings below may reference images not displayed]

FINDINGS: There are five lumbar-type vertebral bodies.

No evidence of fracture or dislocation.  The vertebral body heights
are maintained.

Mild degenerative changes of the lower lumbar spine.

Calcification overlying the right upper abdomen, likely reflecting
a gallstone.

Heterogeneous calcifications overlying the right pelvis, possibly
reflecting a uterine fibroid.
IMPRESSION: No fracture or dislocation is seen.

Mild degenerative changes of the lower lumbar spine.

## 2010-10-10 NOTE — H&P (Signed)
[name], [name] NO.:  [id number]  MEDICAL RECORD NO.:  [id number]  LOCATION:  [facility]                         FACILITY:  [facility]  PHYSICIAN:  Lyn Records, M.D.   DATE OF BIRTH:  [birth year]  DATE OF ADMISSION:  10/02/2010 DATE OF DISCHARGE:                             HISTORY & PHYSICAL   REASON FOR ADMISSION:  Chest discomfort.  SUBJECTIVE:  The patient is 55 and has a preexisting history of coronary artery disease receiving a drug-eluting Taxus stent to the mid LAD (20 mm in length) in 2008.  Since that time, she has had various complaints and recently underwent repeat catheterization because of dyspnea, which has been her anginal equivalent.  This procedure performed in May 2012 demonstrated widely patent coronaries including the stented region in the LAD.  On this occasion, the patient developed a sharp subxiphoid and lower precordial discomfort that radiated through to the back and had pleuritic qualities.  The discomfort waxed and waned.  It became much more severe after she assumed recumbency early this morning.  She sat up again and the discomfort would not go away.  She came to the emergency room via EMS.  Morphine helped the discomfort.  She also believes the nitroglycerin may have helped.  She is now pain free and very able to give a history.  There was no dyspnea with this discomfort similar to those episodes of ischemia in the past.  There was some discomfort in the left shoulder.  She denied palpitations.  CURRENT MEDICAL REGIMENS: 1. Lantus insulin U100 90 units each morning. 2. Lisinopril 40 mg once a day. 3. Furosemide 80 mg once a day. 4. Potassium chloride 30 mEq twice a day. 5. ProAir 108 two puffs q.4 h. as needed for wheezing and shortness of     breath. 6. Nitroglycerin 0.4 mg under the tongue as needed for angina. 7. BC Headache Powders as needed for headache. 8. Naprosyn 550 mg tablets twice a day as needed for pain. 9.  Aspirin 325 mg per day. 10.Crestor 20 mg twice a day. 11.Plavix 75 mg daily. 12.NovoLog Flex Pen 100, 60 units subcutaneously as directed at the     beginning of each meal three times daily. 13.Atenolol 25 mg daily. 14.Symbicort 160 two puffs twice daily.  SIGNIFICANT MEDICAL PROBLEMS: 1. Obstructive sleep apnea.  No mention of treatment. 2. Diabetes mellitus with poor control and last A1c greater than 9. 3. Morbid obesity. 4. Hypertension. 5. Gastroesophageal reflux/hiatal hernia. 6. Hyperlipidemia. 7. Asthma. 8. Coronary artery disease as outlined above with prior ischemic     symptom being dyspnea and Taxus drug-eluting stent in the mid LAD     demonstrated to be patent in May 2012. 9. Peripheral neuropathy. 10.Tendency towards hypokalemia.  ALLERGIES:  BYSTOLIC, MOBIC, and SULFA.  PREVIOUS SURGERIES: 1. Status post fibroid removal. 2. D and C. 3. Achilles tendon repair. 4. History of cardiac stent as noted above.  FAMILY HISTORY:  Positive for vascular disease with both father dying of vascular disease including MI.  Mother alive 27 with diabetes and history of MI.  A 77 year old brother dying of complications of diabetes.  Two sisters with diabetes.  The  patient has no children.  HABITS:  Smoked until 46.  Denies ethanol consumption.  Works at the [employer].  Three years of college.  The patient is married.  No children.  REVIEW OF SYSTEMS:  Musculoskeletal pains.  Neuropathy with numbness and tingling in feet and hands.  She denies hemoptysis.  There has not been lower extremity swelling or pain in the lower extremities.  She has remained moderately active.  No recent surgery.  No recent long trips.  OBJECTIVE:  VITAL SIGNS:  On exam, blood pressure is 160/90, heart rate is 80, respiratory rate is 16, O2 saturation on 2 L per minute is 98. GENERAL:  The patient is morbidly obese. NECK:  Neck veins are difficult to fully assess.  No carotid  bruits are heard. LUNGS:  Clear. CARDIAC:  No murmur, rub, click, or gallop. ABDOMEN:  Markedly obese.  No obvious tenderness.  Bowel sounds are heard. EXTREMITIES:  No edema.  Radial pulses are 2+ bilaterally.  Dorsalis pedis pulses are 2+ bilaterally. NEURO:  Grossly intact.  EKG is normal, but with near low voltage.  Chest x-ray reveals decreased inspiratory onset and elevated right hemidiaphragm.  LABORATORY DATA:  Otherwise unremarkable.  Initial set of cardiac markers are unremarkable.  Blood sugar 190.  Prior CT scan has demonstrated fatty infiltration of the liver.  ASSESSMENT: 1. Chest pain, atypical in quality and different from previous     ischemic symptoms.  The discomfort has some pleuritic quality.     Rule out musculoskeletal versus serositis versus gallbladder     disease versus other. 2. Morbid obesity. 3. Diabetes with poor control.  PLAN: 1. Check D-dimer. 2. Cycle cardiac enzymes. 3. Not certain that we need to do a cardiac ischemic evaluation and     certainly repeat cath will not be done unless evidence of injury or     EKG evidence of ischemia.  I will hold off on performing a CT scan of the aorta.  All of her pulses are symmetric and she is no longer     having pain.  We will consider doing a gallbladder ultrasound.     Lyn Records, M.D.     HWS/MEDQ  D:  10/02/2010  T:  10/02/2010  Job:  [id number]  cc:   Corky Crafts, MD Dario Guardian, M.D.  Electronically Signed by Verdis Prime M.D. on 10/10/2010 04:21:43 PM

## 2010-10-10 NOTE — Discharge Summary (Signed)
  NAMECAMYLA, [name] NO.:  [id number]  MEDICAL RECORD NO.:  [id number]  LOCATION:  2928                         FACILITY:  [facility]  PHYSICIAN:  Lyn Records, M.D.   DATE OF BIRTH:  10-13-1955  DATE OF ADMISSION:  10/02/2010 DATE OF DISCHARGE:  10/03/2010                              DISCHARGE SUMMARY   REASON FOR ADMISSION:  Epigastric discomfort radiating to her back.  DISCHARGE DIAGNOSES: 1. Epigastric and radiation whole back pain of uncertain cause.     Myocardial infarction was excluded.  Gallbladder disease/acute     cholecystitis was excluded.  Rule out gastritis/gastroesophageal     reflux. 2. Coronary atherosclerotic heart disease with recent documentation of     patent coronaries in May 2012.     a.     Negative markers on this occasion. 3. Morbid obesity. 4. Hypertension. 5. Diabetes mellitus. 6. Sleep apnea. 7. History of asthma. 8. History of gastroesophageal reflux.  CONSULTATIONS OBTAINED:  General Surgery by Dr. Manus Rudd on October 03, 2010.  PROCEDURES PERFORMED:  Abdominal gallbladder ultrasound October 02, 2010. Nuclear medicine hepatobiliary scan October 03, 2010.  DISCHARGE/PLAN: 1. The patient is to follow up with Dr. Merri Brunette. 2. The patient is to follow up with Dr. Catalina Gravel as previously     scheduled.  Medications are unchanged from admission and include: 1. Allegra 1 tablet per day. 2. Aspirin 325 mg per day. 3. Atenolol 25 mg per day. 4. Crestor 20 mg twice daily. 5. Furosemide 40 mg twice daily. 6. K-Lor 10 mEq twice daily. 7. Lantus insulin 90 units daily. 8. Lisinopril 20 mg tablets two daily. 9. Nitroglycerin 0.4 mg sublingually p.r.n. chest pain. 10.NovoLog insulin 60 units twice daily with meals. 11.Plavix 75 mg daily. 12.ProAir 1 tablet every 4 hours as needed. 13.Symbicort 2 puffs twice daily.  ACTIVITY:  As tolerated.  HISTORY AND PHYSICAL AND HOSPITAL COURSE:  The patient was admitted to the  hospital with epigastric and back discomfort.  Please see the admitting history and physical.  The patient's cardiac markers were negative x3.  Hemoglobin A1c was 8.3.  Her lipase was normal. Hemoglobin was 13.6.  Electrolytes were normal with alkaline phosphatase of 133.  I chose not to do further cardiac evaluation with normal cardiac cath, normal EKGs, and normal cardiac markers on this admission. The cardiac cath was just done in May.  I had a suspicion that the discomfort was GI.  We performed a gallbladder ultrasound, which was poor quality.  I consulted General Surgery and Nuclear Medicine, hepatobiliary scan was done and was unremarkable.  With the patient feeling back to normal, she is discharged in improved condition on October 03, 2010.  She will follow up with her physicians as listed.     Lyn Records, M.D.     HWS/MEDQ  D:  10/03/2010  T:  10/04/2010  Job:  [id number]  cc:   Wilmon Arms. Tsuei, M.D. Corky Crafts, MD Dario Guardian, M.D.  Electronically Signed by Verdis Prime M.D. on 10/10/2010 04:21:40 PM

## 2010-10-10 NOTE — Consult Note (Signed)
[name], [name]              ACCOUNT NO.:  [id number]  MEDICAL RECORD NO.:  [id number]  LOCATION:  2928                         FACILITY:  [facility]  PHYSICIAN:  Wilmon Arms. Corliss Skains, M.D. DATE OF BIRTH:  11/16/1955  DATE OF CONSULTATION:  10/03/2010 DATE OF DISCHARGE:                                CONSULTATION   REQUESTING PHYSICIAN:  Lyn Records, MD.  CONSULTING SURGEON:  Wilmon Arms. Fuad Forget, MD.  REASON FOR CONSULTATION:  Abdominal pain.  HISTORY OF PRESENT ILLNESS:  [name] is a pleasant 55 year old, obese, African American female, who about 2 weeks ago had ate some pizza and wings.  She states that over the last period of time, she has been trying to eat a little healthier and has had mostly bland diet and salads, but this was a family function and she decided to have some pizza and wings.  She states a couple hours afterwards, she started experiencing significant epigastric discomfort, seemed to radiate to both the right and left as well as to her back and seemed to be worsened by deep inspiration.  She has a cardiac history and was very concerned about her possibility of her having an event as she has history of angina.  She apparently laid down, tried to go to sleep, but it has intensified and worsened throughout the evening.  She finally took nitroglycerin and after that did not improve things, she called paramedics to bring her to the hospital.  She presented and was admitted by Dr. Katrinka Blazing for possibility of angina.  She had a slight elevation of her white blood cell count of 11.7 and mildly elevated alkaline phosphatase level.  Her cardiac workup essentially had been negative, but Dr. Katrinka Blazing has concerned about the possibility of gallbladder disease is the etiology behind her symptoms.  Currently, she is feeling much better, though she still having some epigastric discomfort and it has not completely gone away.  She did eat some regular diet last night, but they  did not seem to exacerbate her pain.  She has not had any nausea or vomiting.  She has not had any fever.  Dr. Katrinka Blazing sent the patient for an ultrasound, however, due to her significant obesity and body habitus, did not allow for an adequate exam.  Gallbladder appeared contracted, but no definitive findings such as gallstones or wall thickening were identified.  Therefore, he was asked surgical consultation to evaluate the patient for the potential need for further workup.  PAST MEDICAL HISTORY:  Significant for diabetes mellitus, morbid obesity, hypertension, coronary artery disease, hyperlipidemia, gastroesophageal reflux disease, obstructive sleep apnea.  SURGICAL HISTORY:  The patient has had D and C.  She has had fibroid removal.  She has had Achilles tendon repair and she has also had PTCA of coronary.  CURRENT MEDICATIONS:  Include, Lantus, lisinopril, Lasix, potassium, ProAir, nitroglycerin, BC Powders as needed, Naprosyn as needed, aspirin, Crestor, Plavix, NovoLog, atenolol, and Symbicort.  FAMILY HISTORY:  The patient has a history of vascular disease and diabetes.  States that her sister had gallbladder disease and surgery.  SOCIAL HISTORY:  The patient previously smoked until 1987.  Denies alcohol or illicit drug use.  She currently  works at [employer].  She is married.  REVIEW OF SYSTEMS:  Please see history present illness for pertinent findings.  PHYSICAL EXAMINATION:  GENERAL:  Reveals a 55 year old morbidly obese female, in no acute distress at the present time. VITAL SIGNS:  Temperature of 98.8, heart rate of 75, blood pressure of 118/63, respiratory rate of 14. ENT:  Unremarkable. NECK:  Supple without lymphadenopathy.  Trachea is midline.  No thyromegaly or masses. LUNGS:  Clear to auscultation.  No wheezes, rhonchi, or rales.  Normal respiratory effort without use of accessory muscles. HEART:  Regular rate and rhythm.  No murmurs, gallops,  or rubs. Carotids are 2+ and brisk without bruit. ABDOMEN:  Obese, but nondistended.  The patient is mildly tender in the epigastric region.  No definitive right upper quadrant tenderness is elicited.  No peritoneal signs are found. RECTAL:  Deferred. EXTREMITIES:  Decreased range of motion in all extremities without crepitus or pain. SKIN:  Otherwise, warm and dry with good turgor.  No rashes, lesions, nodules, or jaundice. NEUROLOGIC:  The patient is alert and oriented x3.  DIAGNOSTICS:  CBC showed a white blood cell count 11.7, hemoglobin of 30.6, hematocrit of 38.0, platelet count of 312.  Metabolic panel shows a sodium of 136, potassium of 3.9, chloride of 101, CO2 of 27, BUN of 11, creatinine of 0.64, glucose of 190.  Liver enzymes are normal with the exception of a mild elevation of alkaline phosphatase of 133.  Ultrasound as noted is very limited.  IMPRESSION: 1. Epigastric abdominal pain in a postprandial setting in a patient     who certainly has risk factors for biliary disease including     obesity and diabetes, though she also has an underlying history of     gastroesophageal reflux disease and is certainly on and off     medications including BC Powders, Naprosyn, aspirin, and Plavix to     have peptic ulcer disease. 2. The patient has multiple medical comorbidities as listed.  RECOMMENDATIONS:  This patient obviously has some atypical symptoms and could represent biliary process versus esophagitis or gastritis.  I was inclined to allow the patient to eat and if she remained asymptomatic that she could be discharged.  However, she seems very anxious and nervous about the possibility of having recurrence of her pain, having to come back.  Therefore, we will go ahead and proceed with a HIDA scan today.  If her HIDA scan shows visualization of the gallbladder and no evidence of acute cholecystitis, would recommend low-fat diet, minimization of Naprosyn and BC powders  in addition of either Pepcid or proton pump inhibitor to protect her stomach as well as discharge.  If her HIDA scan shows evidence of biliary process, then further discussion is going to need to be had given the patient's use of Plavix.  Certainly, it does not present as acute cholecystitis at present time, but we will follow along.     Brayton El, PA-C   ______________________________ Wilmon Arms. Corliss Skains, M.D.    KB/MEDQ  D:  10/03/2010  T:  10/03/2010  Job:  [id number]  Electronically Signed by Brayton El  on 10/06/2010 11:56:59 AM Electronically Signed by Manus Rudd M.D. on 10/10/2010 05:03:26 PM

## 2010-11-24 ENCOUNTER — Other Ambulatory Visit: Payer: Self-pay | Admitting: Gastroenterology

## 2011-02-25 ENCOUNTER — Encounter (INDEPENDENT_AMBULATORY_CARE_PROVIDER_SITE_OTHER): Payer: 59 | Admitting: Cardiology

## 2011-02-25 ENCOUNTER — Other Ambulatory Visit: Payer: Self-pay | Admitting: Cardiology

## 2011-02-25 DIAGNOSIS — E1159 Type 2 diabetes mellitus with other circulatory complications: Secondary | ICD-10-CM

## 2011-02-25 DIAGNOSIS — I739 Peripheral vascular disease, unspecified: Secondary | ICD-10-CM

## 2011-08-07 ENCOUNTER — Ambulatory Visit (INDEPENDENT_AMBULATORY_CARE_PROVIDER_SITE_OTHER): Payer: 59 | Admitting: Surgery

## 2011-08-07 ENCOUNTER — Encounter (INDEPENDENT_AMBULATORY_CARE_PROVIDER_SITE_OTHER): Payer: Self-pay | Admitting: Surgery

## 2011-08-07 VITALS — BP 170/96 | HR 75 | Temp 96.2°F | Ht 62.5 in | Wt 322.2 lb

## 2011-08-07 DIAGNOSIS — E669 Obesity, unspecified: Secondary | ICD-10-CM

## 2011-08-07 NOTE — Patient Instructions (Signed)
Thanks for your patience.  If you need further assistance after leaving the office, please call our office and speak with French Ana A.  [phone].  If you want to leave a message for Dr. Daphine Deutscher, please call his office phone at [phone].  Laparoscopic Gastric Band Surgery This surgery is done to help you lose weight.  BEFORE THE SURGERY  Do not gain any more weight once you know you will be having this surgery.   Arrange for someone to take you home from the hospital.   The day before the surgery:   Eat small liquid meals such as broth.   Use half of the surgical scrub, if given, to shower or bathe with. Do not use the surgical scrub to wash your hair. Use regular shampoo.   The day of the surgery:   Shower or bathe using the second half of the surgical scrub.   Arrive at your appointment time.   You will change into a hospital gown.   A tube (IV) will be put in your vein.  SURGERY A band is put around the upper part of your stomach. This makes a small pouch which can hold only a small amount of food. The lower, bigger part of your stomach is below the band. The 2 parts stay connected by a small opening between the upper and the lower parts. Food goes through the opening to the lower part of your stomach more slowly than before the surgery. You will feel more full with smaller amounts of food. On the inner lining of the band around your stomach is a balloon. The balloon is empty during the surgery. Later, at an office visit, it is filled with fluid. Your doctor puts the fluid in through a tube (port) that is right under the skin of your belly. AFTER THE SURGERY  You will go to the recovery room.   You may be given pain medicine.   You may be asked to walk once you are stable.   You may have shoulder pain caused by the gas.   By the time you go home, try to walk for 35 minutes every day.   You will be shown how to use a small breathing machine (incentive spirometer). This  will help you take deep breaths. You need to use this machine several times a day while you are in the hospital and after you go home.   You will need to take another test. For this test, you will swallow a liquid that will show up on X-ray. You will have X-rays taken while you are in different positions. These X-rays will show:   If your new stomach has any leaks.   How well your new stomach holds liquids.   How the liquid moves down to your gut.   Try to not throw up (vomit). Tell your doctor if you feel sick to your stomach (nauseous). There is medicine to help keep you from throwing up.   Your diet will begin with drinking clear liquids (jello, tea, juice and broth) in small amounts.   Your doctor will decide when you are ready to drink or eat more.  Document Released: 02/28/2010 Document Revised: 01/15/2011 Document Reviewed: 02/28/2010 [facility] Patient Information 2012 [place], [place].

## 2011-08-07 NOTE — Progress Notes (Signed)
Chief Complaint:  Morbid obesity with multiple comorbidities (58)  History of Present Illness:  [name] is an 56 y.o. female who needs bariatric surgery because of a panoply of comorbidities.  She is followed by Dr. Milderd Meager and has a BMI of 58 and type 2 diabetes along with hypertension, hyperlipidemia, and has had some stents in her heart for arterial sclerotic cardiovascular disease. Her family history is full of brothers and sisters that have diabetes and weight issues.  I gave her a lap band booklet she presented here wanting a lap band. I think that would be a intervention that could help her from and distally along with dietary changes. I emphasize that she would need to change her diet in order for this to work.  She was to begin a weight loss gurney and we will begin that study. She denies any history of DVT. She has had some pretty significant gastroesophageal reflux in the past. Will need to look for a hiatal hernia on her upper GI.  Past Medical History  Diagnosis Date  . Arthritis   . Asthma   . CHF (congestive heart failure)   . Diabetes mellitus   . GERD (gastroesophageal reflux disease)   . Hyperlipidemia   . Hypertension     Past Surgical History  Procedure Date  . Uterine fibroid surgery 2008  . Achilles tendon repair 1995    right  . Heart stent 2007  . Hand surgery     right    Current Outpatient Prescriptions  Medication Sig Dispense Refill  . aspirin 325 MG tablet Take 325 mg by mouth daily.      Marland Kitchen atenolol (TENORMIN) 25 MG tablet Take 25 mg by mouth daily.      . clopidogrel (PLAVIX) 75 MG tablet       . CRESTOR 20 MG tablet       . DEXILANT 60 MG capsule       . fexofenadine (ALLEGRA) 180 MG tablet Take 180 mg by mouth daily.      Marland Kitchen FREESTYLE LITE test strip       . furosemide (LASIX) 40 MG tablet       . LANTUS SOLOSTAR 100 UNIT/ML injection       . lisinopril (PRINIVIL,ZESTRIL) 20 MG tablet       . NOVOLOG FLEXPEN 100 UNIT/ML injection        . potassium chloride (K-DUR) 10 MEQ tablet Take 10 mEq by mouth 3 (three) times daily.       Sulfur Family History  Problem Relation Age of Onset  . Kidney disease Father    Social History:   reports that she quit smoking about 29 years ago. She does not have any smokeless tobacco history on file. She reports that she does not drink alcohol or use illicit drugs.   REVIEW OF SYSTEMS - PERTINENT POSITIVES ONLY: Negative for DVT  Physical Exam:   Blood pressure 170/96, pulse 75, temperature 96.2 F (35.7 C), temperature source Temporal, height 5' 2.5" (1.588 m), weight 322 lb 3.2 oz (146.149 kg). Body mass index is 57.99 kg/(m^2).  Gen:  WDWN African American female NAD  Neurological: Alert and oriented to person, place, and time. Motor and sensory function is grossly intact  Head: Normocephalic and atraumatic.  Eyes: Conjunctivae are normal. Pupils are equal, round, and reactive to light. No scleral icterus.  Neck: Normal range of motion. Neck supple. No tracheal deviation or thyromegaly present.  Cardiovascular:  SR without  murmurs or gallops.  No carotid bruits Respiratory: Effort normal.  No respiratory distress. No chest wall tenderness. Breath sounds normal.  No wheezes, rales or rhonchi.  Abdomen:  Markedly obese but nontender GU: Musculoskeletal: Normal range of motion. Extremities are nontender. No cyanosis, edema or clubbing noted Lymphadenopathy: No cervical, preauricular, postauricular or axillary adenopathy is present Skin: Skin is warm and dry. No rash noted. No diaphoresis. No erythema. No pallor. Pscyh: Normal mood and affect. Behavior is normal. Judgment and thought content normal.   LABORATORY RESULTS: No results found for this or any previous visit (from the past 48 hour(s)).  RADIOLOGY RESULTS: No results found.  Problem List: There is no problem list on file for this patient.   Assessment & Plan: BMI 58 with multiple comorbidities. We'll begin a workup  for laparoscopic adjustable gastric banding. I discussed the procedure with her in detail and given a booklet materials on this.    Matt B. Daphine Deutscher, MD, [facility] [phone] beeper [phone]  08/07/2011 4:17 PM

## 2011-08-10 ENCOUNTER — Other Ambulatory Visit (INDEPENDENT_AMBULATORY_CARE_PROVIDER_SITE_OTHER): Payer: Self-pay | Admitting: General Surgery

## 2011-08-10 DIAGNOSIS — Z9884 Bariatric surgery status: Secondary | ICD-10-CM

## 2011-08-11 DIAGNOSIS — E669 Obesity, unspecified: Secondary | ICD-10-CM | POA: Insufficient documentation

## 2011-08-22 ENCOUNTER — Encounter: Payer: 59 | Attending: Surgery | Admitting: *Deleted

## 2011-08-22 ENCOUNTER — Encounter: Payer: Self-pay | Admitting: *Deleted

## 2011-08-22 VITALS — Ht 62.5 in | Wt 326.5 lb

## 2011-08-22 DIAGNOSIS — E669 Obesity, unspecified: Secondary | ICD-10-CM | POA: Insufficient documentation

## 2011-08-22 DIAGNOSIS — Z713 Dietary counseling and surveillance: Secondary | ICD-10-CM | POA: Insufficient documentation

## 2011-08-22 DIAGNOSIS — Z01818 Encounter for other preprocedural examination: Secondary | ICD-10-CM | POA: Insufficient documentation

## 2011-08-22 NOTE — Progress Notes (Addendum)
  Pre-Op Assessment Visit:  Pre-Operative LAGB Surgery  Medical Nutrition Therapy:  Appt start time: 0800   End time:  0900.  Patient was seen on 08/22/2011 for Pre-Operative LAGB Nutrition Assessment. Assessment and letter of approval faxed to [facility] Bariatric Surgery Program coordinator on 08/22/2011.  Approval letter sent to [facility] center and will be available in the chart under the media tab.  TANITA  BODY COMP RESULTS  08/22/11   %Fat 51.6%   Fat Mass (lbs) 168.5   Fat Free Mass (lbs) 158.0   Total Body Water (lbs) 115.5   Handouts given during visit include:  Pre-Op Goals   Bariatric Surgery Protein Shakes   Patient to call for Pre-Op and Post-Op Nutrition Education at [facility] when surgery is scheduled.

## 2011-08-22 NOTE — Patient Instructions (Addendum)
   Follow Pre-Op Nutrition Goals to prepare for Lap Band Surgery.   Call the [facility] at [phone] once you have been given your surgery date to enrolled in the Pre-Op Nutrition Class. You will need to attend this nutrition class 3-4 weeks prior to your surgery.  

## 2011-08-24 ENCOUNTER — Encounter: Payer: Self-pay | Admitting: *Deleted

## 2011-08-26 ENCOUNTER — Ambulatory Visit (HOSPITAL_COMMUNITY)
Admission: RE | Admit: 2011-08-26 | Discharge: 2011-08-26 | Disposition: A | Discharge status: Home / Self Care | Payer: 59 | Source: Ambulatory Visit | Attending: Surgery | Admitting: Surgery

## 2011-08-26 ENCOUNTER — Other Ambulatory Visit: Payer: Self-pay

## 2011-08-26 DIAGNOSIS — N200 Calculus of kidney: Secondary | ICD-10-CM | POA: Insufficient documentation

## 2011-08-26 DIAGNOSIS — I10 Essential (primary) hypertension: Secondary | ICD-10-CM | POA: Insufficient documentation

## 2011-08-26 DIAGNOSIS — Z6841 Body Mass Index (BMI) 40.0 and over, adult: Secondary | ICD-10-CM | POA: Insufficient documentation

## 2011-08-26 DIAGNOSIS — D259 Leiomyoma of uterus, unspecified: Secondary | ICD-10-CM | POA: Insufficient documentation

## 2011-08-26 DIAGNOSIS — Z9884 Bariatric surgery status: Secondary | ICD-10-CM

## 2011-08-26 DIAGNOSIS — J9819 Other pulmonary collapse: Secondary | ICD-10-CM | POA: Insufficient documentation

## 2011-08-26 DIAGNOSIS — E119 Type 2 diabetes mellitus without complications: Secondary | ICD-10-CM | POA: Insufficient documentation

## 2011-08-26 DIAGNOSIS — I509 Heart failure, unspecified: Secondary | ICD-10-CM | POA: Insufficient documentation

## 2011-08-26 DIAGNOSIS — Z13820 Encounter for screening for osteoporosis: Secondary | ICD-10-CM | POA: Insufficient documentation

## 2011-08-26 DIAGNOSIS — K219 Gastro-esophageal reflux disease without esophagitis: Secondary | ICD-10-CM | POA: Insufficient documentation

## 2011-08-26 DIAGNOSIS — E785 Hyperlipidemia, unspecified: Secondary | ICD-10-CM | POA: Insufficient documentation

## 2011-08-26 IMAGING — CR DG UGI W/ KUB
15 of 18 series · 15 of 18 positions shown · non-contrast
Comparison: none

CLINICAL DATA: Morbid obesity.  Pre-op evaluation for bariatric
surgery.  Gastroesophageal reflux.  Diabetes and hypertension.

UPPER GI SERIES WITH KUB
TECHNIQUE: After obtaining a scout radiograph a single-column
upper GI series was performed using thin barium.
Fluoroscopy time: 3.4 minutes

[run (1 of 13)]
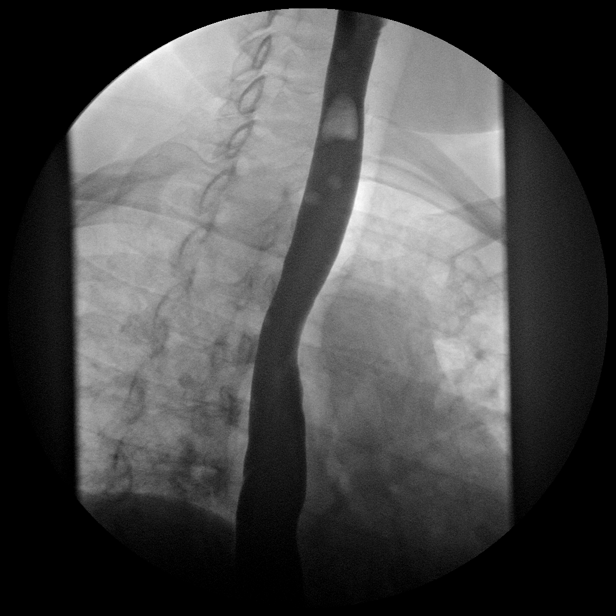

[run (2 of 13)]
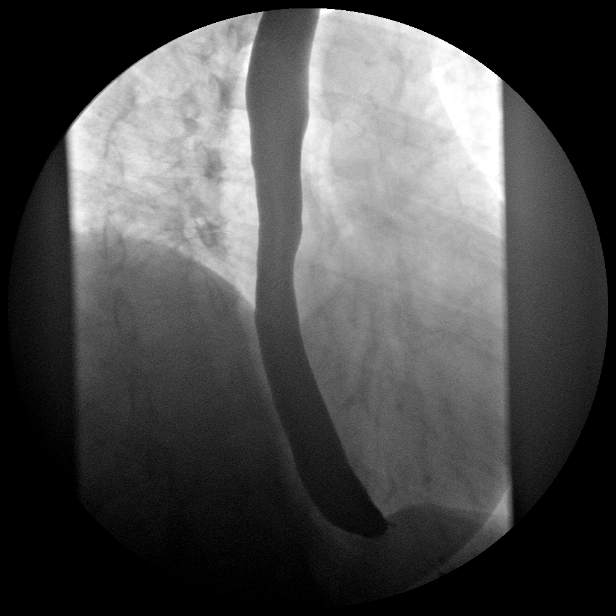

[run (3 of 13)]
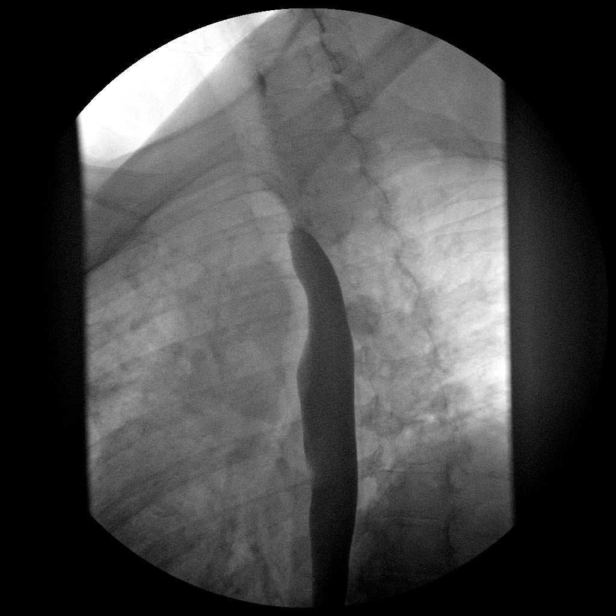

[run (4 of 13)]
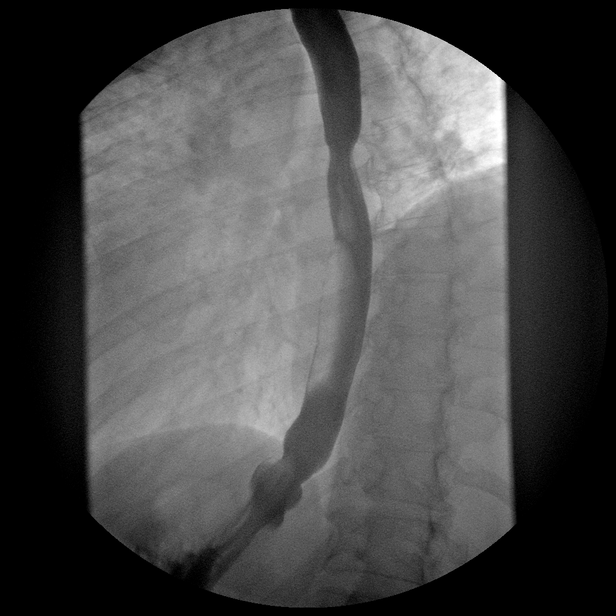

[run (5 of 13)]
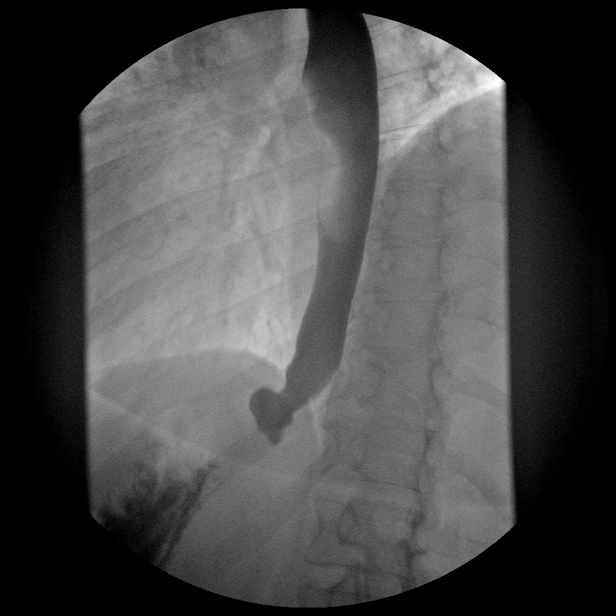

[run (6 of 13)]
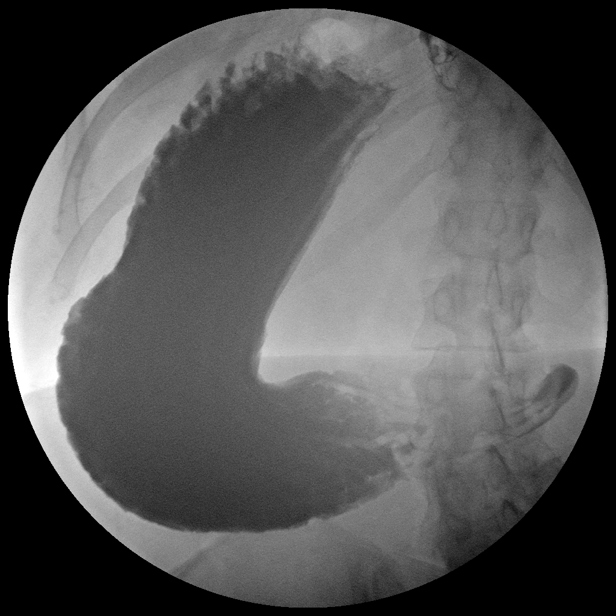

[run (7 of 13)]
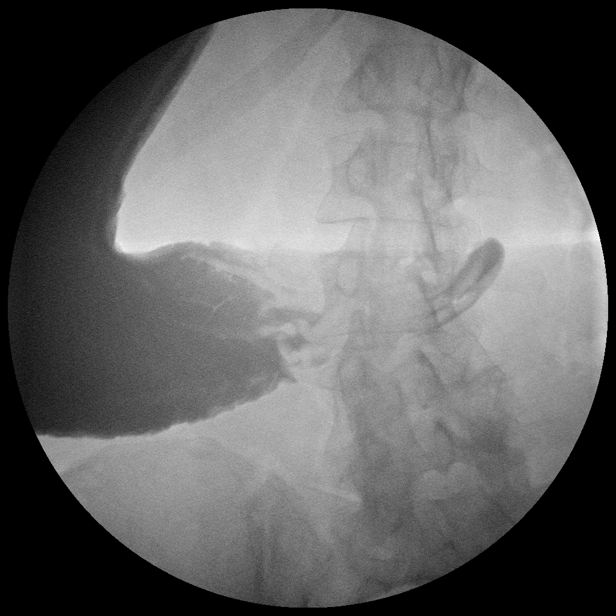

[run (8 of 13)]
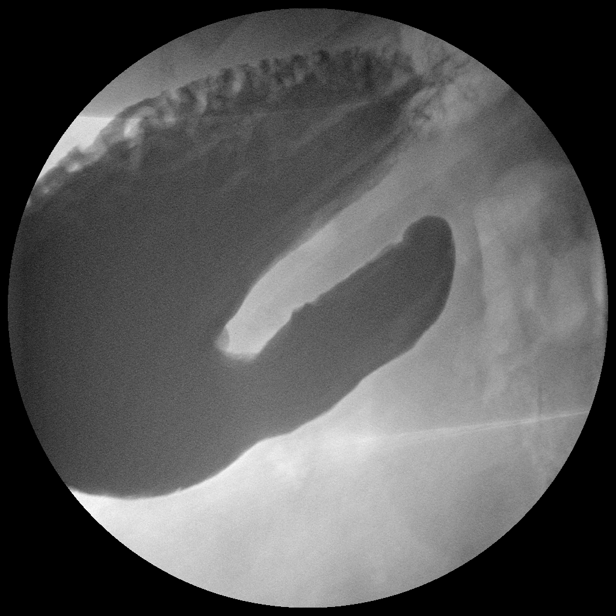

[run (9 of 13)]
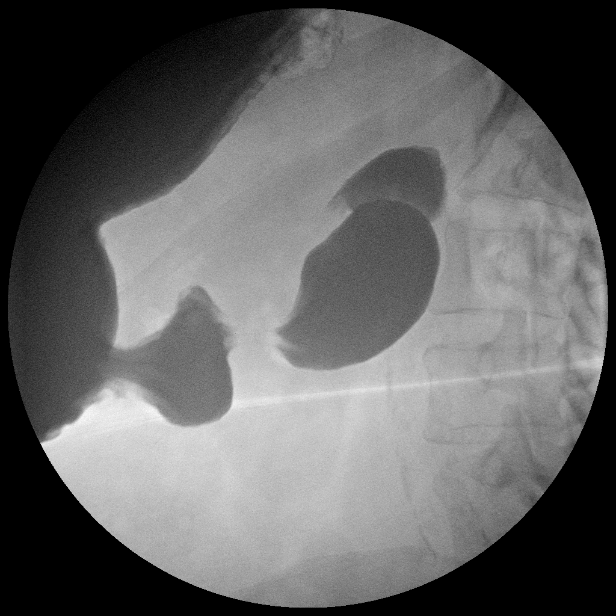

[run (10 of 13)]
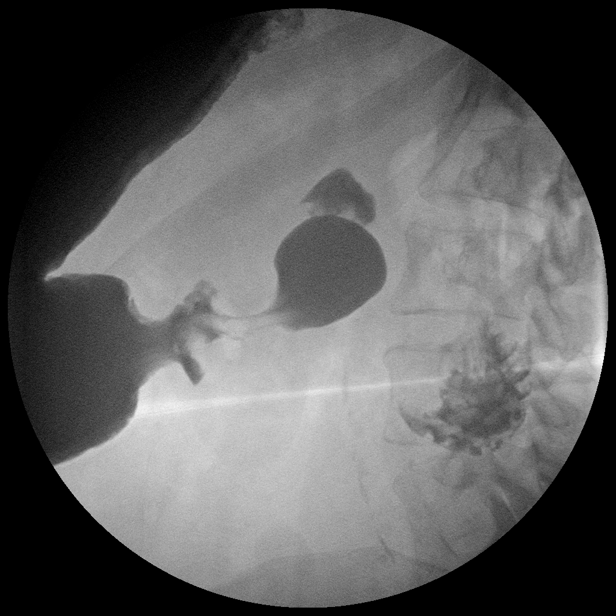

[run (11 of 13)]
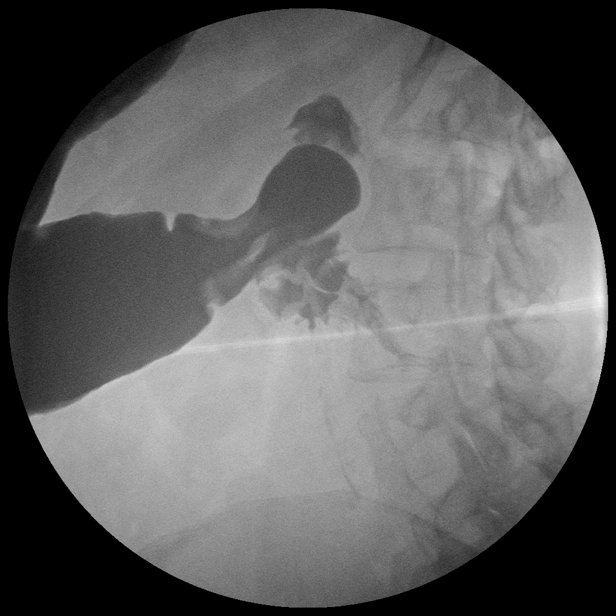

[run (12 of 13)]
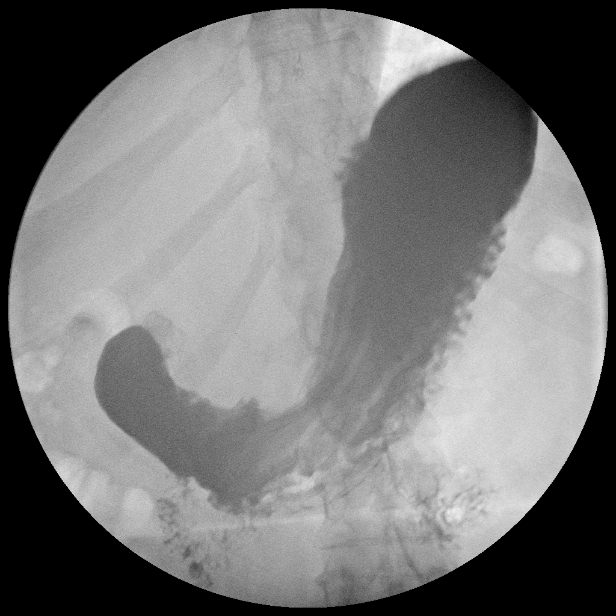

[run (13 of 13)]
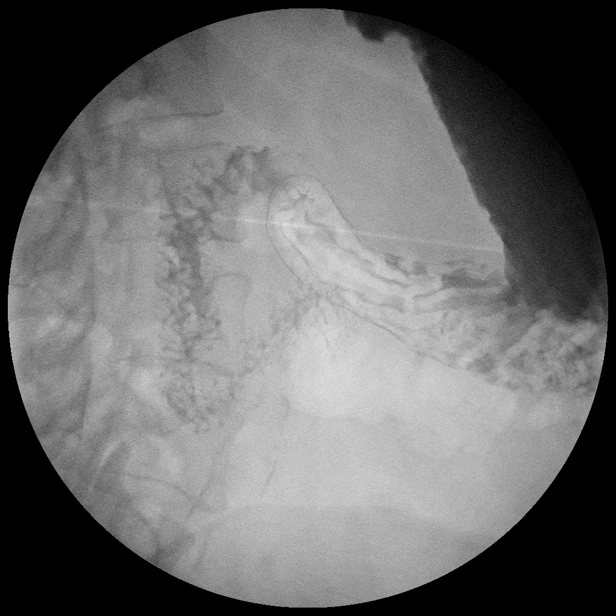

[view not recorded (1 of 2)]
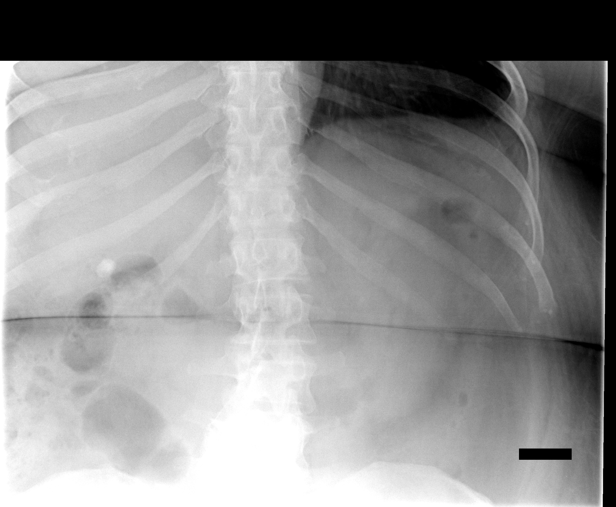

[view not recorded (2 of 2)]
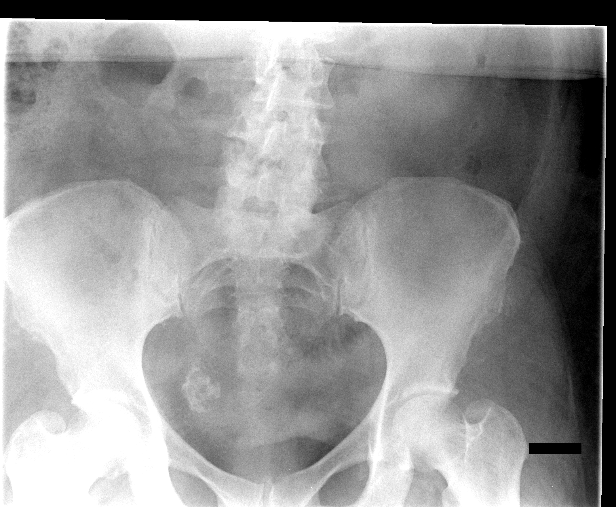

[15 of 18 positions shown; findings below may reference images not displayed]

FINDINGS: The scout radiograph shows a normal bowel gas pattern. 1
cm right renal calculus noted, as well as a partially calcified
fibroid is also seen in the right pelvis.

There is no evidence of esophageal mass or stricture.  There is no
evidence of hiatal hernia, and no gastroesophageal reflux was seen
during the exam.  Esophageal motility is within normal limits.

The stomach is normal in appearance.  There is no evidence of
gastric masses or ulcers.  Duodenal bulb and sweep are normal in
appearance.
IMPRESSION: 1.  Negative UGI series.
2.  Right nephrolithiasis and calcified uterine fibroid
incidentally noted.

## 2011-08-26 IMAGING — US US ABDOMEN COMPLETE
1 series · 14 of 25 positions shown · non-contrast
Comparison: Abdominal ultrasound [DATE]

CLINICAL DATA: Preop bariatric surgery

COMPLETE ABDOMINAL ULTRASOUND

[Series 1: us abdomen complete · 14 of 68 slices shown]
[im 1/68]
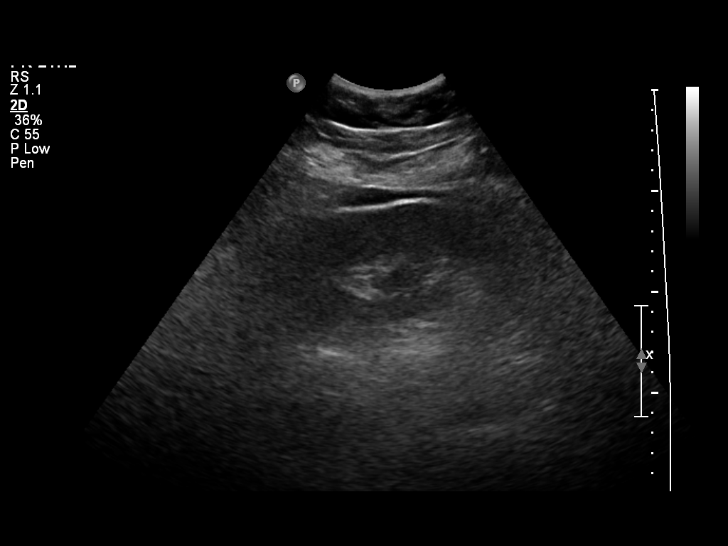
[im 6/68]
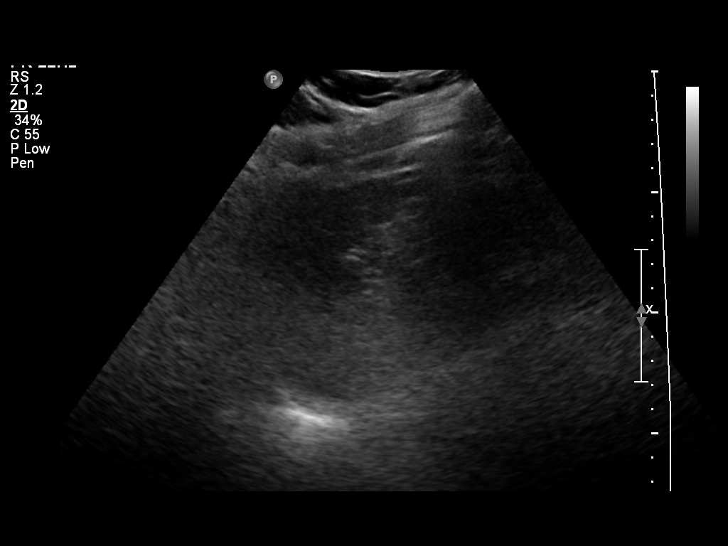
[im 12/68]
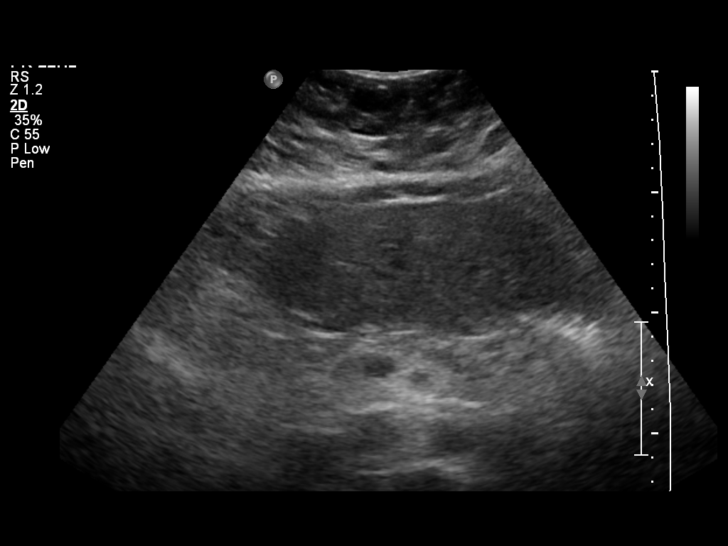
[im 17/68]
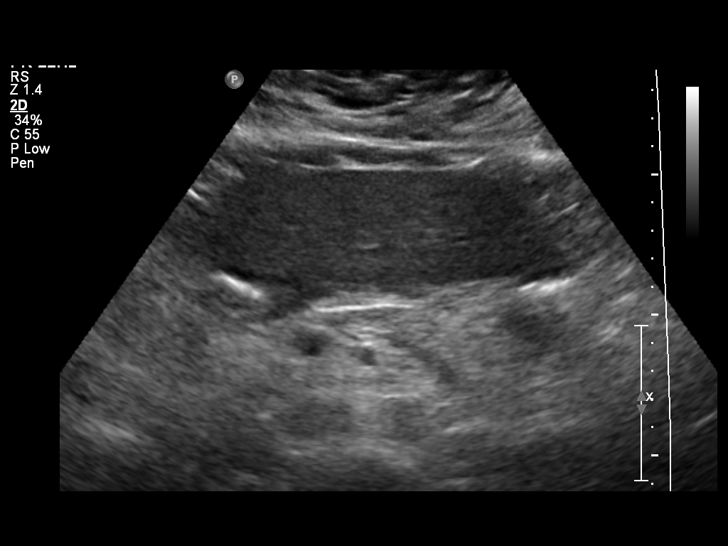
[im 23/68]
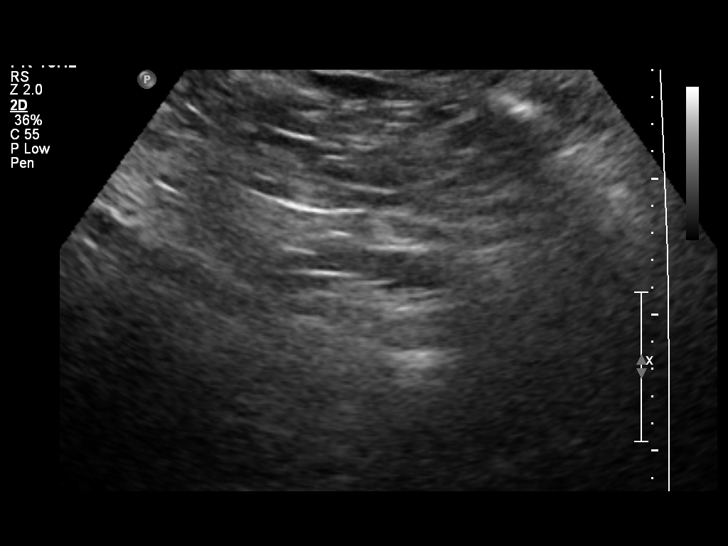
[im 26/68]
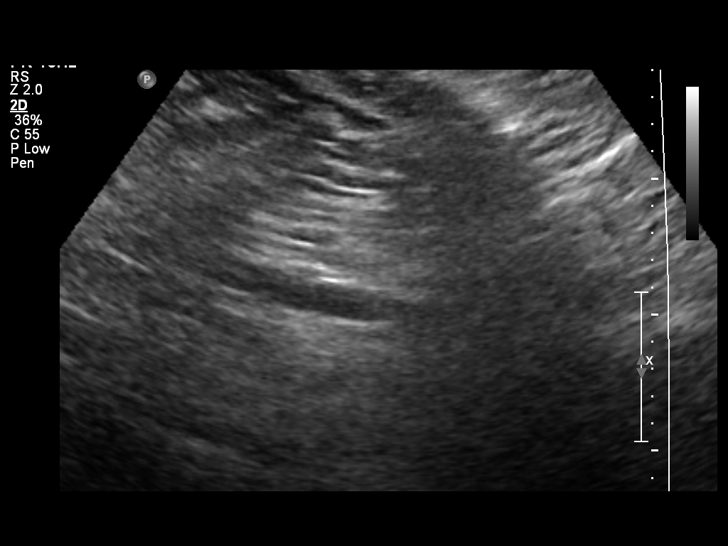
[im 31/68]
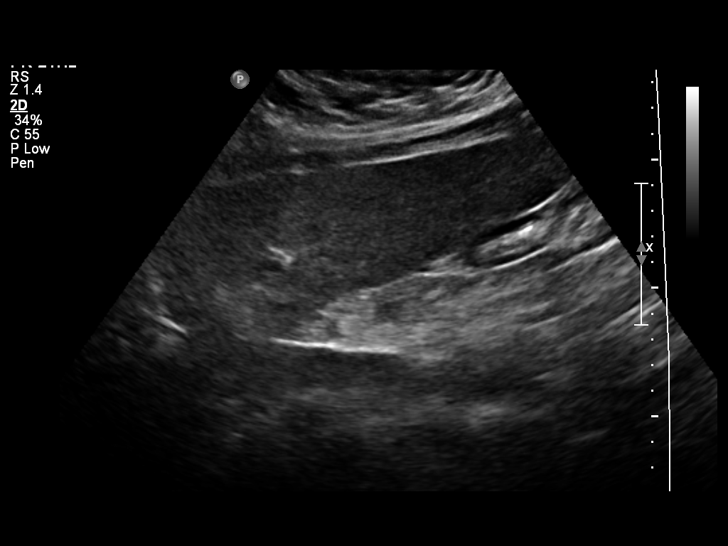
[im 37/68]
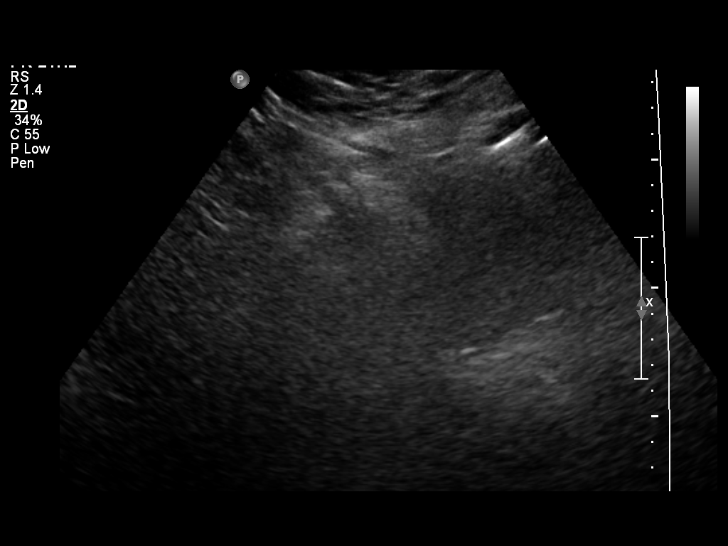
[im 42/68]
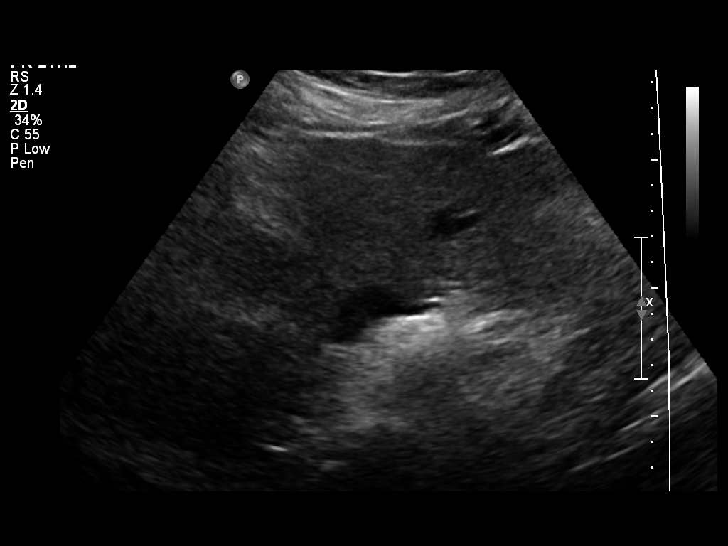
[im 45/68]
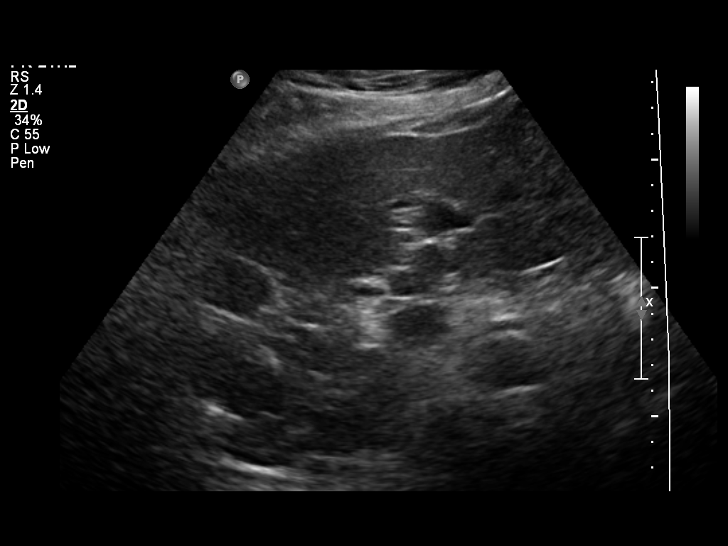
[im 51/68]
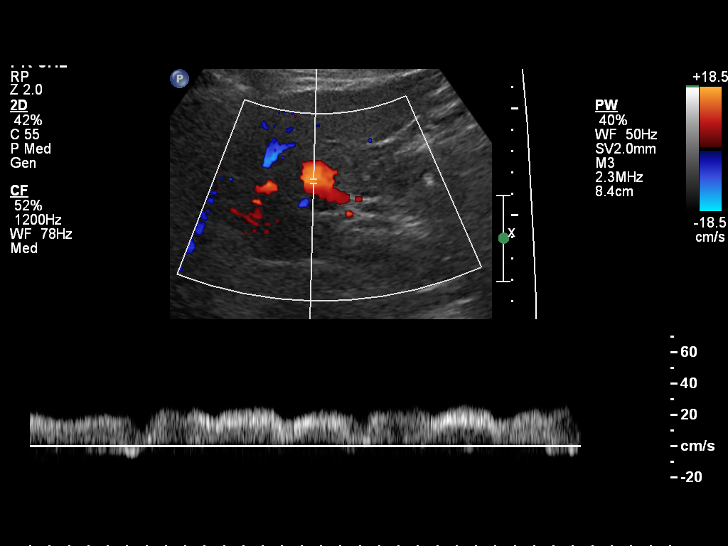
[im 56/68]
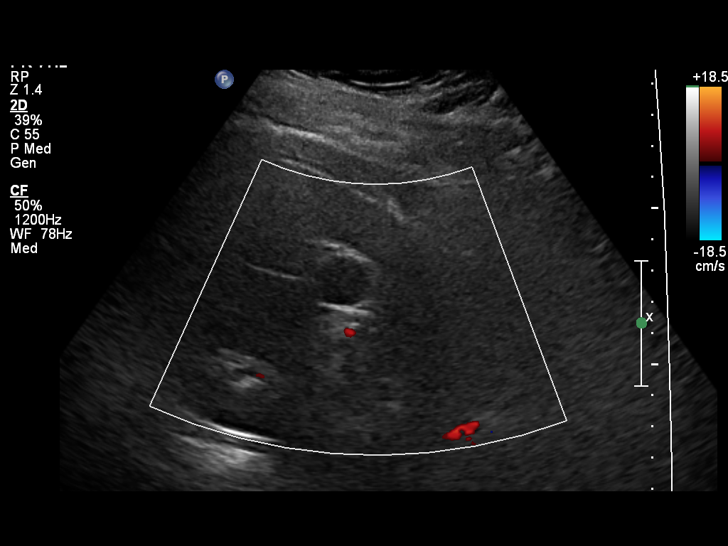
[im 62/68]
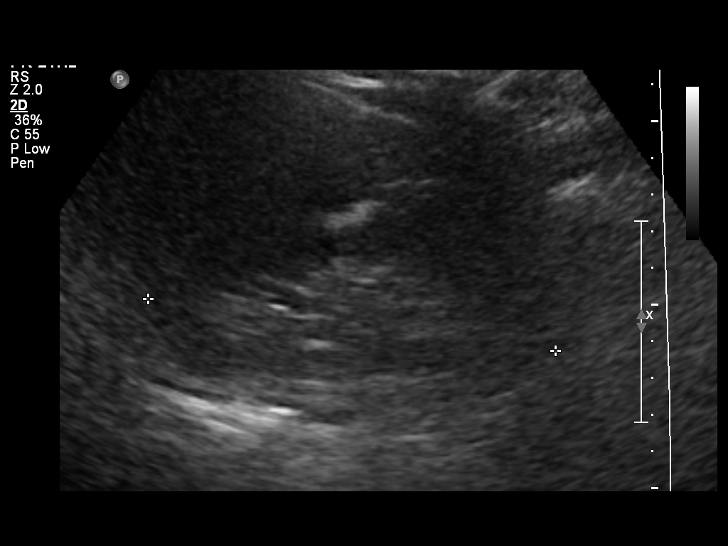
[im 68/68]
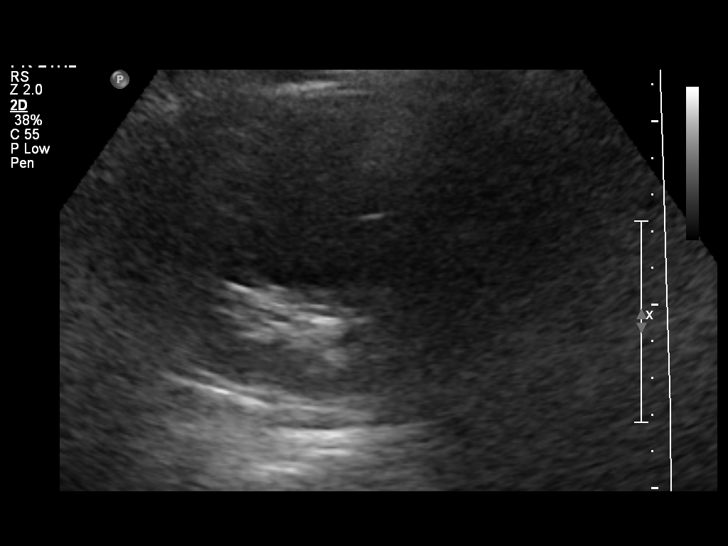

[14 of 25 positions shown; findings below may reference images not displayed]

FINDINGS: Gallbladder:  Gallbladder difficult to visualize due to patient
body habitus.  The gallbladder is not distended and the gallbladder
wall is normal thickness.  No echogenic gallstones are evident.
Negative sonographic Murphy's sign.

Common bile duct:  Normal 4.0.

Liver:  No focal lesion identified.  Within normal limits in
parenchymal echogenicity.

IVC:  Appears normal.

Pancreas:  No focal abnormality seen.

Spleen:  Normal size and echogenicity.

Right Kidney:  11.2cm in length.  No evidence of hydronephrosis or
stones.

Left Kidney:  11.2cm in length.  No evidence of hydronephrosis or
stones.

Abdominal aorta:  No aneurysm identified.
IMPRESSION: 1..  No gallbladder abnormality in exam limited by patient body
habitus.
2.  Normal common bile duct.

## 2011-08-26 IMAGING — CR DG CHEST 2V
3 series · 3 of 3 positions shown · non-contrast
Comparison: [DATE]

CLINICAL DATA: Preop bariatric screening

CHEST - 2 VIEW

[view not recorded (1 of 3)]
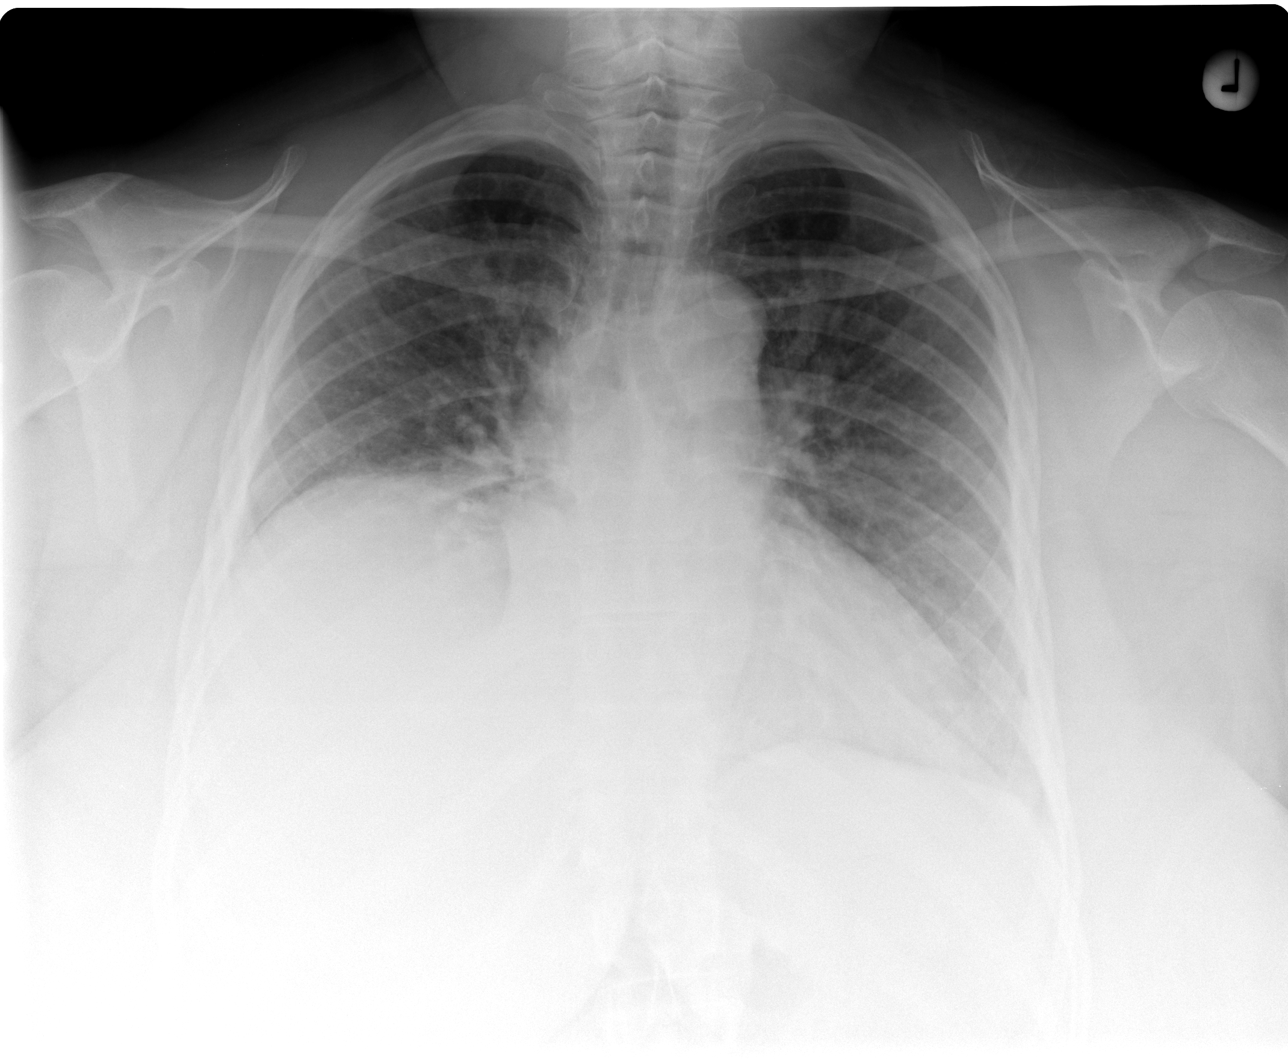

[view not recorded (2 of 3)]
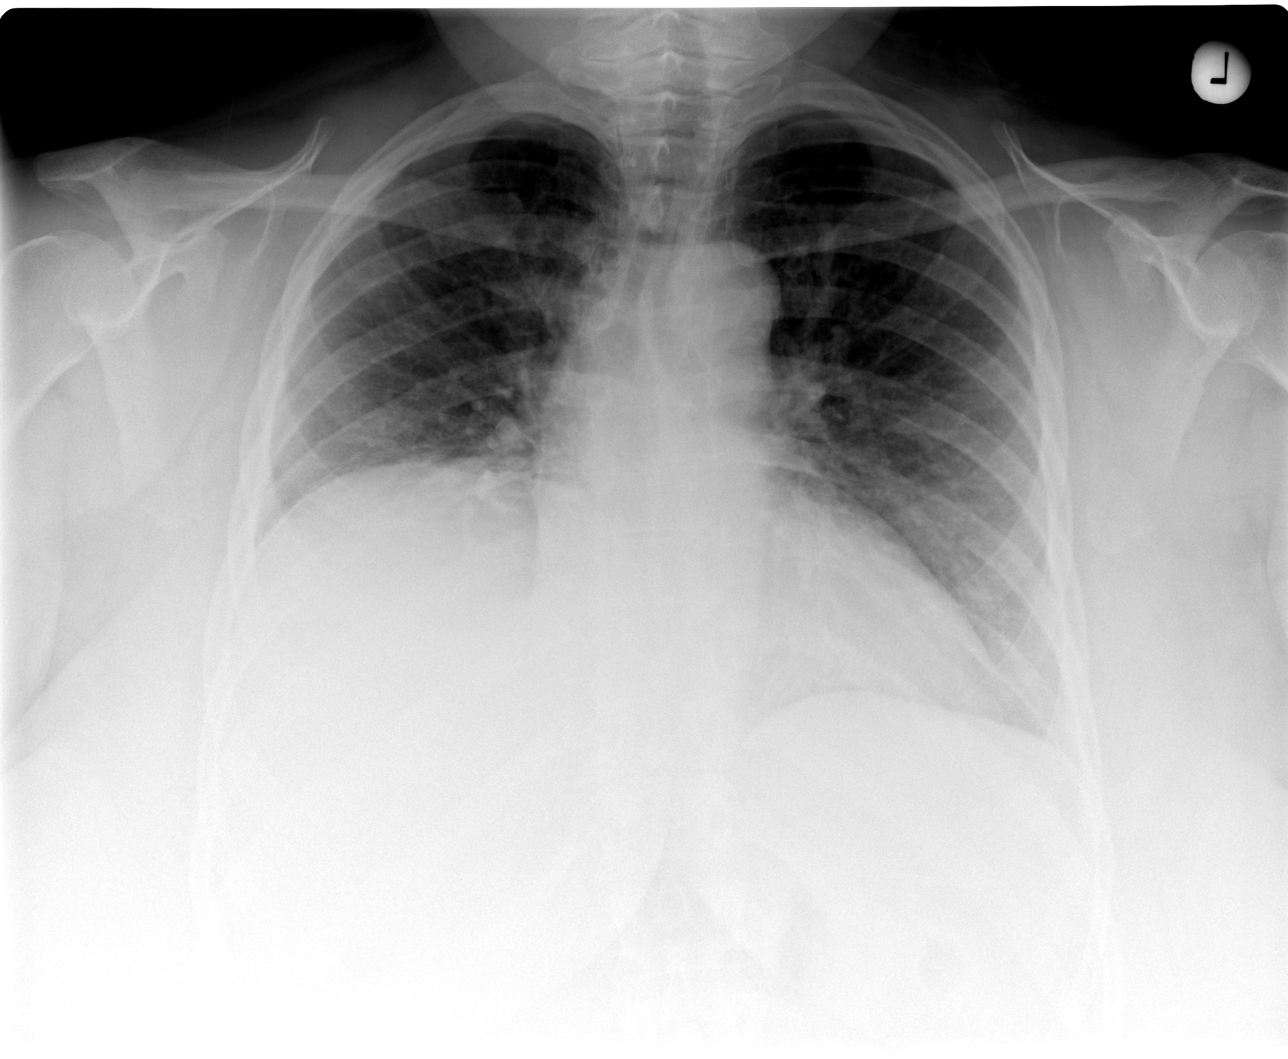

[view not recorded (3 of 3)]
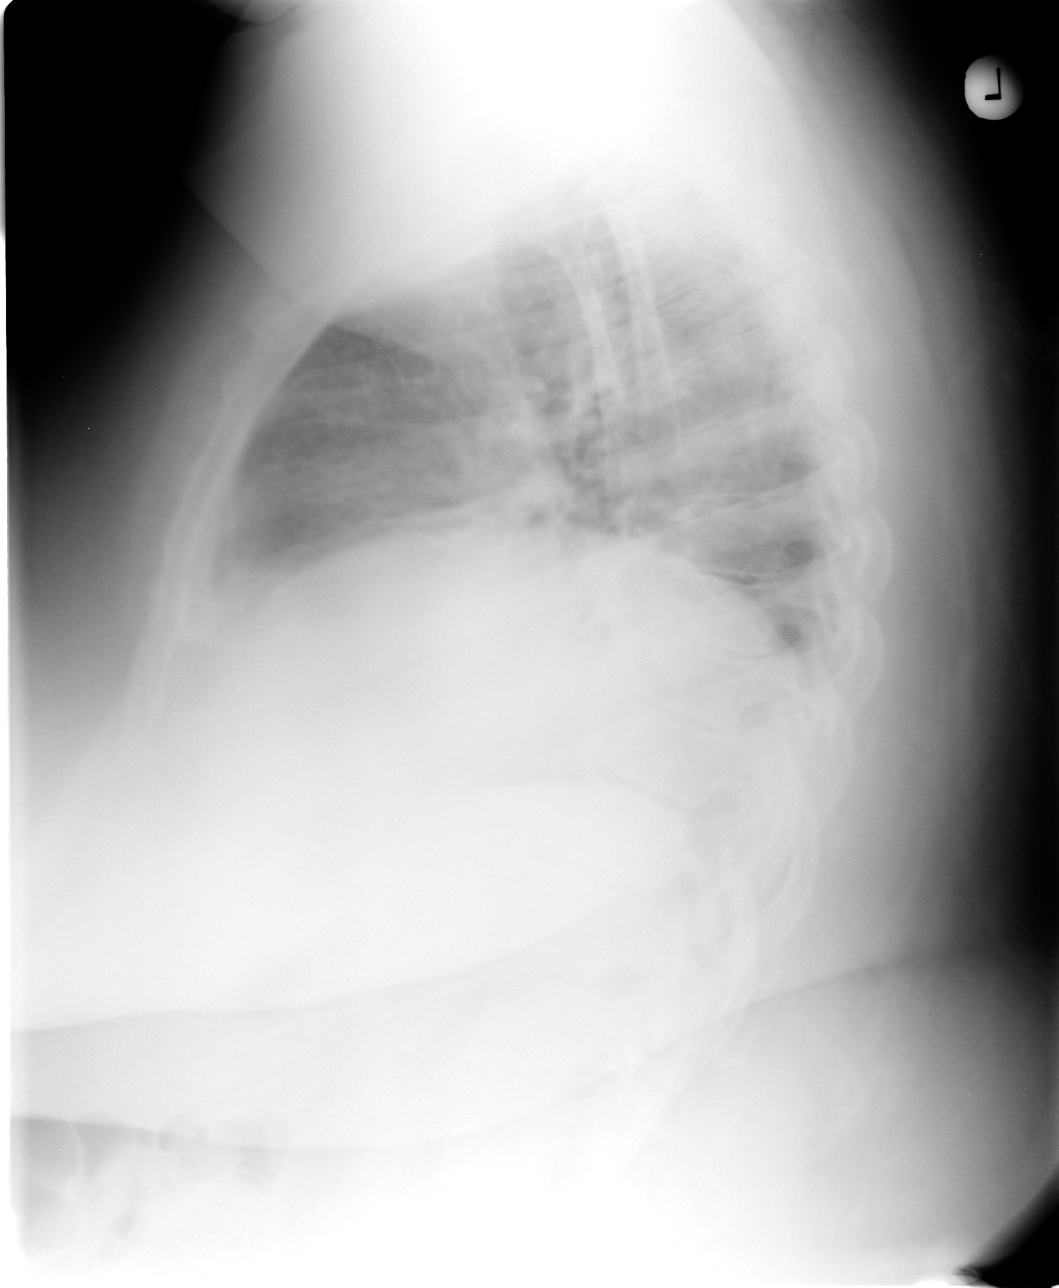

[3 of 3 positions shown; findings below may reference images not displayed]

FINDINGS: Stable chronic elevation of the right hemidiaphragm with
associated right basilar atelectasis.  Left lung essentially clear.
No pleural effusion or pneumothorax.

The heart is top normal in size.

Mild degenerative changes of the visualized thoracolumbar spine.
IMPRESSION: No evidence of acute cardiopulmonary disease.

Stable chronic elevation of the right hemidiaphragm with associated
right basilar atelectasis.

## 2011-09-11 ENCOUNTER — Other Ambulatory Visit: Payer: Self-pay | Admitting: Obstetrics and Gynecology

## 2011-09-11 ENCOUNTER — Ambulatory Visit (HOSPITAL_BASED_OUTPATIENT_CLINIC_OR_DEPARTMENT_OTHER): Payer: 59 | Attending: Surgery

## 2011-09-11 VITALS — Ht 62.0 in | Wt 318.0 lb

## 2011-09-11 DIAGNOSIS — G4733 Obstructive sleep apnea (adult) (pediatric): Secondary | ICD-10-CM | POA: Insufficient documentation

## 2011-09-11 DIAGNOSIS — Z9884 Bariatric surgery status: Secondary | ICD-10-CM

## 2011-09-19 DIAGNOSIS — G4733 Obstructive sleep apnea (adult) (pediatric): Secondary | ICD-10-CM

## 2011-09-19 DIAGNOSIS — R0609 Other forms of dyspnea: Secondary | ICD-10-CM

## 2011-09-19 DIAGNOSIS — R0989 Other specified symptoms and signs involving the circulatory and respiratory systems: Secondary | ICD-10-CM

## 2011-09-20 NOTE — Procedures (Signed)
NAMEMAYDELIN, DEMING              ACCOUNT NO.:  [id number]  MEDICAL RECORD NO.:  [id number]          PATIENT TYPE:  OUT  LOCATION:  SLEEP CENTER                 FACILITY:  [facility]  PHYSICIAN:  Mashell Sieben D. Maple Hudson, MD, FCCP, FACPDATE OF BIRTH:  06/11[birth year]  DATE OF STUDY:  09/11/2011                           NOCTURNAL POLYSOMNOGRAM  REFERRING PHYSICIAN:  Thornton Park. Daphine Deutscher, MD  INDICATION FOR STUDY:  Hypersomnia with sleep apnea.  EPWORTH SLEEPINESS SCORE:  10/24.  BMI of 58.2, weight 318 pounds, height 62 inches, neck 17 inches.  MEDICATIONS:  Home medications are charted and reviewed.  SLEEP ARCHITECTURE:  Total sleep time 171.5 minutes with sleep efficiency 45%.  Stage I was 27.4%, stage II 72.6%, stages III and REM were absent.  Sleep latency 31.5 minutes.  Awake after sleep onset 179.5 minutes.  Arousal index 51.8.  BEDTIME MEDICATION:  None.  RESPIRATORY DATA:  Apnea-hypopnea index (AHI) 42.7 per hour.  A total of 122 events was scored including 94 obstructive apneas and 28 hypopneas. Events were not positional.  Because of fragmented early sleep, she did not meet protocol requirements for sustained sleep in the first hours necessary for application of split protocol CPAP titration on the study night.  OXYGEN DATA:  Extremely loud snoring with oxygen desaturation to a nadir of 84% and mean oxygen saturation through the study of 92.6% on room air.  CARDIAC DATA:  Sinus rhythm with PVCs.  MOVEMENT/PARASOMNIA:  No significant movement disturbance.  Bathroom x2.  IMPRESSION/RECOMMENDATION: 1. Severe obstructive sleep apnea/hypopnea syndrome, AHI of 42.7 per     hour with non positional events.  Extremely loud snoring with     oxygen desaturation to a nadir of 84% and mean oxygen saturation     through the study of 92.6% on room air. 2. Because of inability to sustain sleep, she did not meet the     protocol requirements for initiation of split protocol     CPAP titration on  this night.  Consider return for dedicated CPAP     titration study or alternative management as clinically     appropriate.  Weight loss is expected to be of significant     therapeutic benefit.     Blimy Napoleon D. Maple Hudson, MD, [facility], FACP Diplomate, American Board of Sleep Medicine    CDY/MEDQ  D:  09/19/2011 09:35:37  T:  09/20/2011 02:20:23  Job:  [id number]

## 2011-09-24 ENCOUNTER — Encounter (INDEPENDENT_AMBULATORY_CARE_PROVIDER_SITE_OTHER): Payer: Self-pay

## 2011-10-29 ENCOUNTER — Ambulatory Visit (INDEPENDENT_AMBULATORY_CARE_PROVIDER_SITE_OTHER): Payer: 59 | Admitting: Psychiatry

## 2011-11-05 ENCOUNTER — Ambulatory Visit (INDEPENDENT_AMBULATORY_CARE_PROVIDER_SITE_OTHER): Payer: 59 | Admitting: Psychiatry

## 2012-01-05 ENCOUNTER — Other Ambulatory Visit (INDEPENDENT_AMBULATORY_CARE_PROVIDER_SITE_OTHER): Payer: Self-pay

## 2012-01-05 DIAGNOSIS — Z9884 Bariatric surgery status: Secondary | ICD-10-CM

## 2012-04-06 ENCOUNTER — Ambulatory Visit (INDEPENDENT_AMBULATORY_CARE_PROVIDER_SITE_OTHER): Payer: 59 | Admitting: Psychiatry

## 2012-04-20 ENCOUNTER — Ambulatory Visit (INDEPENDENT_AMBULATORY_CARE_PROVIDER_SITE_OTHER): Payer: 59 | Admitting: Psychiatry

## 2012-05-05 ENCOUNTER — Ambulatory Visit (INDEPENDENT_AMBULATORY_CARE_PROVIDER_SITE_OTHER): Payer: 59 | Admitting: Psychiatry

## 2012-05-18 ENCOUNTER — Ambulatory Visit (INDEPENDENT_AMBULATORY_CARE_PROVIDER_SITE_OTHER): Payer: 59 | Admitting: Psychiatry

## 2012-06-07 ENCOUNTER — Ambulatory Visit (INDEPENDENT_AMBULATORY_CARE_PROVIDER_SITE_OTHER): Payer: 59 | Admitting: Psychiatry

## 2012-06-13 LAB — COMPREHENSIVE METABOLIC PANEL
Albumin: 4.1 g/dL (ref 3.5–5.2)
Alkaline Phosphatase: 108 U/L (ref 39–117)
BUN: 9 mg/dL (ref 6–23)
CO2: 29 mEq/L (ref 19–32)
Calcium: 9.7 mg/dL (ref 8.4–10.5)
Chloride: 102 mEq/L (ref 96–112)
Glucose, Bld: 125 mg/dL — ABNORMAL HIGH (ref 70–99)
Potassium: 4 mEq/L (ref 3.5–5.3)
Sodium: 140 mEq/L (ref 135–145)
Total Protein: 7.6 g/dL (ref 6.0–8.3)

## 2012-06-13 LAB — CBC WITH DIFFERENTIAL/PLATELET
Eosinophils Absolute: 0.1 10*3/uL (ref 0.0–0.7)
Eosinophils Relative: 1 % (ref 0–5)
Hemoglobin: 13.8 g/dL (ref 12.0–15.0)
Lymphocytes Relative: 37 % (ref 12–46)
Lymphs Abs: 2.7 10*3/uL (ref 0.7–4.0)
MCH: 27.1 pg (ref 26.0–34.0)
MCV: 76.5 fL — ABNORMAL LOW (ref 78.0–100.0)
Monocytes Relative: 7 % (ref 3–12)
RBC: 5.1 MIL/uL (ref 3.87–5.11)
WBC: 7.2 10*3/uL (ref 4.0–10.5)

## 2012-06-13 LAB — LIPID PANEL
Cholesterol: 184 mg/dL (ref 0–200)
Triglycerides: 88 mg/dL (ref <150)

## 2012-06-13 LAB — TSH: TSH: 1.745 u[IU]/mL (ref 0.350–4.500)

## 2012-06-13 LAB — HEMOGLOBIN A1C: Hgb A1c MFr Bld: 9.2 % — ABNORMAL HIGH (ref <5.7)

## 2012-06-21 ENCOUNTER — Ambulatory Visit: Payer: 59 | Admitting: Psychiatry

## 2012-06-28 ENCOUNTER — Ambulatory Visit: Payer: 59 | Admitting: Psychiatry

## 2012-06-29 ENCOUNTER — Ambulatory Visit (INDEPENDENT_AMBULATORY_CARE_PROVIDER_SITE_OTHER): Payer: 59 | Admitting: Psychiatry

## 2012-07-06 ENCOUNTER — Ambulatory Visit: Payer: 59 | Admitting: Psychiatry

## 2012-07-19 ENCOUNTER — Ambulatory Visit: Payer: 59 | Admitting: Psychiatry

## 2012-11-15 ENCOUNTER — Other Ambulatory Visit: Payer: Self-pay | Admitting: Cardiology

## 2012-11-15 MED ORDER — ATENOLOL 25 MG PO TABS: 25.0000 mg | ORAL_TABLET | Freq: Every day | ORAL | Status: DC

## 2012-11-15 MED ORDER — FUROSEMIDE 40 MG PO TABS: ORAL_TABLET | ORAL | Status: DC

## 2012-11-15 NOTE — Telephone Encounter (Signed)
refill 

## 2012-11-29 ENCOUNTER — Telehealth: Payer: Self-pay | Admitting: Pharmacist

## 2012-11-29 NOTE — Telephone Encounter (Signed)
We have been trying to get patient enrolled into the PCSK-9 inhibitor study given she has CAD, on Crestor 20 mg qd, LDL ~ 130 mg/dL, and can't afford other lipid lowering meds.  She states her glucose levels have been up and down and she needs to get them leveled out prior to enrolling.  She is working with Dr. Sharl Ma on changing insulin and gaining better glucose control.  Patient hopes to have this under control by 02/09/2013, and wants [facility] to call her at that time to try and enroll her.  I passed this on to [facility] as well

## 2012-11-30 ENCOUNTER — Telehealth: Payer: Self-pay | Admitting: Pharmacist

## 2012-11-30 NOTE — Telephone Encounter (Signed)
Patient was unable to tolerate Crestor 40 mg daily (due to muscle aches), so she would qualify for PCSK-9 study medication on Crestor 20 mg qday if her blood sugars level out by the beginning of the year.  I will let [facility] know once I hear back from patient in a few months.

## 2013-01-31 ENCOUNTER — Telehealth: Payer: Self-pay | Admitting: Cardiology

## 2013-01-31 DIAGNOSIS — E7800 Pure hypercholesterolemia, unspecified: Secondary | ICD-10-CM

## 2013-01-31 NOTE — Telephone Encounter (Signed)
lmtrc

## 2013-01-31 NOTE — Telephone Encounter (Signed)
Message copied by Theda Sers on Tue Jan 31, 2013  1:48 PM ------      Message from: SMART, Gaspar Skeeters      Created: Fri Jan 27, 2013  4:05 PM      Regarding: overdue for lipid / liver       Patient's last lipid was done 03/2012 and was going to try to enroll into study, however patient never called [facility] back.  She needs to come in for a lipid panel and hepatic panel in the near future.  Please notify patient and set up lab. Thanks. ------

## 2013-01-31 NOTE — Telephone Encounter (Signed)
Pt notified. Labs ordered for 02/13/13.

## 2013-02-13 ENCOUNTER — Other Ambulatory Visit: Payer: Self-pay

## 2013-02-14 ENCOUNTER — Other Ambulatory Visit (INDEPENDENT_AMBULATORY_CARE_PROVIDER_SITE_OTHER): Payer: 59

## 2013-02-14 DIAGNOSIS — E7800 Pure hypercholesterolemia, unspecified: Secondary | ICD-10-CM

## 2013-02-14 LAB — HEPATIC FUNCTION PANEL
ALT: 31 U/L (ref 0–35)
AST: 21 U/L (ref 0–37)
Albumin: 4 g/dL (ref 3.5–5.2)
Alkaline Phosphatase: 145 U/L — ABNORMAL HIGH (ref 39–117)
BILIRUBIN DIRECT: 0 mg/dL (ref 0.0–0.3)
TOTAL PROTEIN: 7.9 g/dL (ref 6.0–8.3)
Total Bilirubin: 0.6 mg/dL (ref 0.3–1.2)

## 2013-02-14 LAB — LIPID PANEL
CHOLESTEROL: 154 mg/dL (ref 0–200)
HDL: 33.5 mg/dL — ABNORMAL LOW (ref >39.00)
LDL Cholesterol: 105 mg/dL — ABNORMAL HIGH (ref 0–99)
Total CHOL/HDL Ratio: 5
Triglycerides: 76 mg/dL (ref 0.0–149.0)
VLDL: 15.2 mg/dL (ref 0.0–40.0)

## 2013-02-21 ENCOUNTER — Telehealth: Payer: Self-pay | Admitting: Pharmacist

## 2013-02-21 DIAGNOSIS — E785 Hyperlipidemia, unspecified: Secondary | ICD-10-CM

## 2013-02-21 DIAGNOSIS — Z79899 Other long term (current) drug therapy: Secondary | ICD-10-CM

## 2013-02-21 NOTE — Telephone Encounter (Signed)
LDL goal < 70 given h/o CAD and Diabetes. Cholesterol much improved over past year.  LDL down from 123 mg/dL down to 105 mg/dL on Crestor 20 mg qd.  Can't afford further therapy, and she wanted to enroll into PCSK-9 inhibitor study once glucose under better control.  She was working with Dr. Buddy Duty, however her mother has been sick and she's been taking care of her, and hasn't been able to keep Dr. Buddy Duty appointment, and her glucose is still erratic. Given cholesterol still improving, will wait another 4 months, and if LDL starts to trend back up, may consider PCSK-9 inhibitor study if glucose is under control.  Patient instructed to continue Crestor 20 mg daily and to recheck cholesterol in 4 months.  She will try to get back in with Dr. Buddy Duty re: hyperglycemia.  Appointment made for 06/21/13 for labs.

## 2013-05-05 ENCOUNTER — Encounter: Payer: Self-pay | Admitting: Interventional Cardiology

## 2013-06-06 ENCOUNTER — Other Ambulatory Visit: Payer: Self-pay | Admitting: Cardiology

## 2013-06-06 ENCOUNTER — Telehealth: Payer: Self-pay | Admitting: Interventional Cardiology

## 2013-06-06 MED ORDER — AMLODIPINE BESYLATE 10 MG PO TABS: 10.0000 mg | ORAL_TABLET | Freq: Every day | ORAL | Status: DC

## 2013-06-06 NOTE — Telephone Encounter (Signed)
Pt notified amlodipine was refilled earlier today.

## 2013-06-06 NOTE — Telephone Encounter (Signed)
New problem    Pt need a medication refilled but could not remember the name.   Pt reported this while making her appointment.  Please give her a call if any questions.      Thaks!

## 2013-06-20 ENCOUNTER — Ambulatory Visit: Payer: Self-pay | Admitting: Interventional Cardiology

## 2013-06-21 ENCOUNTER — Other Ambulatory Visit: Payer: Self-pay

## 2013-06-22 ENCOUNTER — Other Ambulatory Visit (INDEPENDENT_AMBULATORY_CARE_PROVIDER_SITE_OTHER): Payer: 59

## 2013-06-22 DIAGNOSIS — Z79899 Other long term (current) drug therapy: Secondary | ICD-10-CM

## 2013-06-22 DIAGNOSIS — E785 Hyperlipidemia, unspecified: Secondary | ICD-10-CM

## 2013-06-22 LAB — LIPID PANEL
CHOL/HDL RATIO: 5
Cholesterol: 167 mg/dL (ref 0–200)
HDL: 32 mg/dL — ABNORMAL LOW (ref >39.00)
LDL Cholesterol: 117 mg/dL — ABNORMAL HIGH (ref 0–99)
TRIGLYCERIDES: 90 mg/dL (ref 0.0–149.0)
VLDL: 18 mg/dL (ref 0.0–40.0)

## 2013-06-22 LAB — HEPATIC FUNCTION PANEL
ALBUMIN: 3.5 g/dL (ref 3.5–5.2)
ALT: 23 U/L (ref 0–35)
AST: 17 U/L (ref 0–37)
Alkaline Phosphatase: 116 U/L (ref 39–117)
Bilirubin, Direct: 0 mg/dL (ref 0.0–0.3)
Total Bilirubin: 0.6 mg/dL (ref 0.2–1.2)
Total Protein: 7.4 g/dL (ref 6.0–8.3)

## 2013-07-10 ENCOUNTER — Other Ambulatory Visit: Payer: Self-pay | Admitting: Cardiology

## 2013-07-10 DIAGNOSIS — E7800 Pure hypercholesterolemia, unspecified: Secondary | ICD-10-CM

## 2013-07-17 ENCOUNTER — Ambulatory Visit (INDEPENDENT_AMBULATORY_CARE_PROVIDER_SITE_OTHER): Payer: 59 | Admitting: Interventional Cardiology

## 2013-07-17 ENCOUNTER — Encounter: Payer: Self-pay | Admitting: Interventional Cardiology

## 2013-07-17 VITALS — BP 179/92 | HR 81 | Ht 62.5 in | Wt 303.8 lb

## 2013-07-17 DIAGNOSIS — I2510 Atherosclerotic heart disease of native coronary artery without angina pectoris: Secondary | ICD-10-CM

## 2013-07-17 DIAGNOSIS — Z79899 Other long term (current) drug therapy: Secondary | ICD-10-CM

## 2013-07-17 DIAGNOSIS — E782 Mixed hyperlipidemia: Secondary | ICD-10-CM | POA: Insufficient documentation

## 2013-07-17 DIAGNOSIS — E669 Obesity, unspecified: Secondary | ICD-10-CM

## 2013-07-17 DIAGNOSIS — I10 Essential (primary) hypertension: Secondary | ICD-10-CM | POA: Insufficient documentation

## 2013-07-17 MED ORDER — LISINOPRIL 40 MG PO TABS: ORAL_TABLET | ORAL | Status: DC

## 2013-07-17 MED ORDER — DEXLANSOPRAZOLE 60 MG PO CPDR: 60.0000 mg | DELAYED_RELEASE_CAPSULE | Freq: Every day | ORAL | Status: DC

## 2013-07-17 NOTE — Patient Instructions (Signed)
Your physician has recommended you make the following change in your medication:   1. Increase Lisinopril to 40 mg daily. Refill sent to [facility].   Refilled Dexilant to [facility].   Your physician recommends that you return for lab work on 07/25/13 for a Bmet.   Your physician wants you to follow-up in: 6 months with Dr. Irish Lack. You will receive a reminder letter in the mail two months in advance. If you don't receive a letter, please call our office to schedule the follow-up appointment.

## 2013-07-17 NOTE — Progress Notes (Signed)
Patient ID: [name], female   DOB: [birth year], 58 y.o.   MRN: [id number]    [place], [address] [place], [place]  [address] Phone: [phone] Fax:  [phone]  Date:  07/17/2013   ID:  [name], DOB [birth year], MRN [id number]  PCP:  Reginia Naas, MD      History of Present Illness: [name] is a 58 y.o. female who has had issues with obesity and CAD. Most recent cath showed patent LAD stent in May 2012. She has lost weight. SHe does feel that she is getting better exercise tolerance with more walking. The burning in her left chest is gone.  She was approved for lap band surgery. She has seen Dr. Hassell Done. There issues with her insurance covering the surgery but she thinks these will be resolved. She is hoping to have her surgery soon.  Feels well. Still has some swelling. Has not taking Lasix as directed. Sometimes took it twice a day, sometimes daily, sometimes none. 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 Has some lower sternal discomfort x 5 since last visit, but thinks it's related to stress and GERD. Wants to known if we can refill her Dexilant. Denies dizziness, syncope, palpitations, no SHOB, orthopnea, PND. No bleeding. Walks once a week for 29min w/o chest pain and Shob. BP is around 150/70s.   Wt Readings from Last 3 Encounters:  07/17/13 303 lb 12.8 oz (137.803 kg)  09/11/11 318 lb (144.244 kg)  08/22/11 326 lb 8 oz (148.099 kg)     Past Medical History  Diagnosis Date  . Arthritis   . Asthma   . CHF (congestive heart failure)   . Diabetes mellitus   . GERD (gastroesophageal reflux disease)   . Hyperlipidemia   . Hypertension   . Morbid obesity     Current Outpatient Prescriptions  Medication Sig Dispense Refill  . amLODipine (NORVASC) 10 MG tablet Take 1 tablet (10 mg total) by mouth daily.  30 tablet  2  . aspirin 325 MG tablet Take 325 mg by mouth daily.      Marland Kitchen atenolol (TENORMIN) 25 MG tablet Take 1 tablet (25 mg total) by mouth  daily.  30 tablet  5  . clopidogrel (PLAVIX) 75 MG tablet Take 75 mg by mouth once.       . Coenzyme Q10 (CO Q 10 PO) Take by mouth daily.      . CRESTOR 20 MG tablet Take 20 mg by mouth daily.       Marland Kitchen DEXILANT 60 MG capsule Take 60 mg by mouth daily.       . fexofenadine (ALLEGRA) 180 MG tablet Take 180 mg by mouth daily.      Marland Kitchen FREESTYLE LITE test strip       . furosemide (LASIX) 40 MG tablet Take 2 tablets po daily.  60 tablet  5  . HUMALOG MIX 75/25 (75-25) 100 UNIT/ML SUSP injection As directed      . KLOR-CON M10 10 MEQ tablet 2 (two) times daily.      Marland Kitchen lisinopril (PRINIVIL,ZESTRIL) 20 MG tablet Take 20 mg by mouth daily.       Marland Kitchen TANZEUM 30 MG PEN once a week.       No current facility-administered medications for this visit.    Allergies:    Allergies  Allergen Reactions  . Crestor [Rosuvastatin]     Couldn't tolerate Crestor 40 mg dose   . Sulfur  Shortness of breath    Social History:  The patient  reports that she quit smoking about 30 years ago. She does not have any smokeless tobacco history on file. She reports that she does not drink alcohol or use illicit drugs.   Family History:  The patient's family history includes Kidney disease in her father.   ROS:  Please see the history of present illness.  No nausea, vomiting.  No fevers, chills.  No focal weakness.  No dysuria.    All other systems reviewed and negative.   PHYSICAL EXAM: VS:  BP 179/92  Pulse 81  Ht 5' 2.5" (1.588 m)  Wt 303 lb 12.8 oz (137.803 kg)  BMI 54.65 kg/m2 Well nourished, well developed, in no acute distress HEENT: normal Neck: no JVD, no carotid bruits Cardiac:  normal S1, S2; RRR;  Lungs:  clear to auscultation bilaterally, no wheezing, rhonchi or rales Abd: soft, nontender, no hepatomegaly Ext: no edema Skin: warm and dry Neuro:   no focal abnormalities noted  EKG:       ASSESSMENT AND PLAN:  Coronary atherosclerosis of native coronary artery  Continue Plavix Tablet, 75 MG,  1 tablet, Orally, Once a day Decrease Aspirin Tablet, 81 MG, 1 tablet, Orally, Once a day IMAGING: EKG    Overton,Shana 09/20/2012 04:01:16 PM > Prosper Paff,JAY 09/20/2012 04:15:30 PM > NSR, PRWP   Notes: Patent LAD stent at the time of the last catheterization. No angina. Some heart burn sx which have been relieved by Dexilant.  Will refill.  p   2. Morbid obesity  Notes: She has had difficulty losing weight. She is open to considering a surguical option for weight loss. Was evaluated for lap band surgery. Needs to go through this process again.  We spoke about the importance of watching her diet. She admits to lapses in her diet. She requires no further cardiac evaluation before weight loss surgery.    3. Hyperlipemia, mixed  Continue Crestor Tablet, 40 MG, 1 tablet, Orally, Once a day, 90 days, 90, Refills 3 Notes: SHe did not tolerate lipitor in the past. LDL 123. LDL target less than 100. She had been forgetting some doses. LDL 117 in 5/15.     4. Benign hypertension  Continue Amlodipine Besylate Tablet, 10 MG, 1 tablet, Orally, Once a day, 30, Refills 11 Notes: Back to taking her meds. BP Still high. Check BP at home. Increase lisinopril to 40 mg daily. BMet inone week.  Take Lasix as previously prescribed.     Preventive Medicine  Adult topics discussed:  Diet: healthy diet, low calorie, low fat.  Exercise: 5 days a week, at least 30 minutes of aerobic exercise.    F/u in 6 months  Signed, Mina Marble, MD, [facility] 07/17/2013 4:04 PM

## 2013-07-25 ENCOUNTER — Telehealth: Payer: Self-pay | Admitting: Cardiology

## 2013-07-25 ENCOUNTER — Other Ambulatory Visit: Payer: Self-pay | Admitting: Cardiology

## 2013-07-25 ENCOUNTER — Other Ambulatory Visit (INDEPENDENT_AMBULATORY_CARE_PROVIDER_SITE_OTHER): Payer: 59

## 2013-07-25 DIAGNOSIS — Z79899 Other long term (current) drug therapy: Secondary | ICD-10-CM

## 2013-07-25 DIAGNOSIS — E7800 Pure hypercholesterolemia, unspecified: Secondary | ICD-10-CM

## 2013-07-25 LAB — BASIC METABOLIC PANEL
BUN: 6 mg/dL (ref 6–23)
CALCIUM: 9.2 mg/dL (ref 8.4–10.5)
CO2: 33 mEq/L — ABNORMAL HIGH (ref 19–32)
Chloride: 100 mEq/L (ref 96–112)
Creatinine, Ser: 0.6 mg/dL (ref 0.4–1.2)
GFR: 129.85 mL/min (ref >60.00)
Glucose, Bld: 169 mg/dL — ABNORMAL HIGH (ref 70–99)
Potassium: 3.2 mEq/L — ABNORMAL LOW (ref 3.5–5.1)
SODIUM: 139 meq/L (ref 135–145)

## 2013-07-25 LAB — LIPID PANEL
Cholesterol: 135 mg/dL (ref 0–200)
HDL: 38.5 mg/dL — AB (ref >39.00)
LDL CALC: 81 mg/dL (ref 0–99)
NONHDL: 96.5
Total CHOL/HDL Ratio: 4
Triglycerides: 76 mg/dL (ref 0.0–149.0)
VLDL: 15.2 mg/dL (ref 0.0–40.0)

## 2013-07-25 LAB — HEPATIC FUNCTION PANEL
ALK PHOS: 129 U/L — AB (ref 39–117)
ALT: 24 U/L (ref 0–35)
AST: 20 U/L (ref 0–37)
Albumin: 3.8 g/dL (ref 3.5–5.2)
BILIRUBIN TOTAL: 0.4 mg/dL (ref 0.2–1.2)
Bilirubin, Direct: 0.1 mg/dL (ref 0.0–0.3)
Total Protein: 7.8 g/dL (ref 6.0–8.3)

## 2013-07-25 NOTE — Telephone Encounter (Signed)
Called pt back to confirm klor-con dosage. Pt states she is only taking 10 MEQ once a day. Per Dr. Irish Lack pt should take klor-con 10 MEQ 2 tablets daily until labwork on 08/02/13. Pt notified.

## 2013-07-26 ENCOUNTER — Other Ambulatory Visit: Payer: Self-pay | Admitting: Cardiology

## 2013-07-26 DIAGNOSIS — E782 Mixed hyperlipidemia: Secondary | ICD-10-CM

## 2013-08-02 ENCOUNTER — Other Ambulatory Visit (INDEPENDENT_AMBULATORY_CARE_PROVIDER_SITE_OTHER): Payer: 59

## 2013-08-02 DIAGNOSIS — Z79899 Other long term (current) drug therapy: Secondary | ICD-10-CM

## 2013-08-02 LAB — BASIC METABOLIC PANEL
BUN: 9 mg/dL (ref 6–23)
CO2: 31 mEq/L (ref 19–32)
CREATININE: 0.7 mg/dL (ref 0.4–1.2)
Calcium: 9.2 mg/dL (ref 8.4–10.5)
Chloride: 97 mEq/L (ref 96–112)
GFR: 120.66 mL/min (ref >60.00)
Glucose, Bld: 299 mg/dL — ABNORMAL HIGH (ref 70–99)
Potassium: 3.6 mEq/L (ref 3.5–5.1)
SODIUM: 135 meq/L (ref 135–145)

## 2013-08-04 ENCOUNTER — Telehealth: Payer: Self-pay | Admitting: Interventional Cardiology

## 2013-08-04 MED ORDER — POTASSIUM CHLORIDE CRYS ER 10 MEQ PO TBCR: 10.0000 meq | EXTENDED_RELEASE_TABLET | Freq: Two times a day (BID) | ORAL | Status: DC

## 2013-08-04 NOTE — Telephone Encounter (Signed)
Returned pt's call.

## 2013-08-04 NOTE — Telephone Encounter (Signed)
Follow Up   Pt is calling follow up on call from earlier. Please call back.

## 2013-09-03 ENCOUNTER — Telehealth: Payer: Self-pay

## 2013-09-04 MED ORDER — NITROGLYCERIN 0.4 MG SL SUBL: 0.4000 mg | SUBLINGUAL_TABLET | SUBLINGUAL | Status: DC | PRN

## 2013-09-04 NOTE — Telephone Encounter (Signed)
Refilled. Nitro was on [employer] at [facility].

## 2013-09-18 ENCOUNTER — Other Ambulatory Visit: Payer: Self-pay | Admitting: *Deleted

## 2013-09-18 MED ORDER — ROSUVASTATIN CALCIUM 20 MG PO TABS: 20.0000 mg | ORAL_TABLET | Freq: Every day | ORAL | Status: DC

## 2013-10-10 ENCOUNTER — Other Ambulatory Visit: Payer: Self-pay

## 2013-11-02 ENCOUNTER — Encounter: Payer: Self-pay | Admitting: *Deleted

## 2013-11-06 ENCOUNTER — Encounter: Payer: Self-pay | Admitting: Physician Assistant

## 2013-11-06 ENCOUNTER — Ambulatory Visit (INDEPENDENT_AMBULATORY_CARE_PROVIDER_SITE_OTHER): Payer: 59 | Admitting: Physician Assistant

## 2013-11-06 VITALS — BP 160/70 | HR 74 | Ht 62.5 in | Wt 309.0 lb

## 2013-11-06 DIAGNOSIS — R079 Chest pain, unspecified: Secondary | ICD-10-CM

## 2013-11-06 DIAGNOSIS — I2510 Atherosclerotic heart disease of native coronary artery without angina pectoris: Secondary | ICD-10-CM

## 2013-11-06 DIAGNOSIS — E782 Mixed hyperlipidemia: Secondary | ICD-10-CM

## 2013-11-06 DIAGNOSIS — R0602 Shortness of breath: Secondary | ICD-10-CM

## 2013-11-06 DIAGNOSIS — I10 Essential (primary) hypertension: Secondary | ICD-10-CM

## 2013-11-06 DIAGNOSIS — I5032 Chronic diastolic (congestive) heart failure: Secondary | ICD-10-CM

## 2013-11-06 LAB — BASIC METABOLIC PANEL
BUN: 8 mg/dL (ref 6–23)
CALCIUM: 9.1 mg/dL (ref 8.4–10.5)
CHLORIDE: 99 meq/L (ref 96–112)
CO2: 27 mEq/L (ref 19–32)
CREATININE: 0.6 mg/dL (ref 0.4–1.2)
GFR: 134.81 mL/min (ref >60.00)
Glucose, Bld: 106 mg/dL — ABNORMAL HIGH (ref 70–99)
Potassium: 3.5 mEq/L (ref 3.5–5.1)
Sodium: 136 mEq/L (ref 135–145)

## 2013-11-06 LAB — TROPONIN I: Troponin I: <0.3 ng/mL (ref <0.06)

## 2013-11-06 LAB — BRAIN NATRIURETIC PEPTIDE: PRO B NATRI PEPTIDE: 13 pg/mL (ref 0.0–100.0)

## 2013-11-06 MED ORDER — CARVEDILOL 3.125 MG PO TABS: 3.1250 mg | ORAL_TABLET | Freq: Two times a day (BID) | ORAL | Status: DC

## 2013-11-06 NOTE — Patient Instructions (Addendum)
Your physician has recommended you make the following change in your medication:  1. STOP ATENOLOL 2. START COREG 3.125 MG TWICE DAILY 3. INCREASE LASIX 60 MG TWICE DAILY FOR 3 DAYS; AFTER 3 DAYS GO BACK TO LASIX 40 MG TWICE DAILY 4. INCREASE POTASSIUM TO 20 MEQ IN THE MORNING AND 10 MEQ IN THE PM FOR 3 DAYS; AFTER 3 DAYS GO BACK TO POTASSIUM 10 MEQ TWICE A DAY   LAB WORK TODAY; BMET, BNP AND A STAT TROPONIN I  Your physician has requested that you have en exercise stress myoview. For further information please visit [email or web address]. Please follow instruction sheet, as given.  Your physician recommends that you schedule a follow-up appointment in: [address] [place] [address] ON A DAY HE IS HERE

## 2013-11-06 NOTE — Progress Notes (Signed)
Cardiology Office Note   Date:  11/06/2013   ID:  [name], DOB 09-18-1955, MRN [id number]  PCP:  Reginia Naas, MD  Cardiologist:  Dr. Casandra Doffing      History of Present Illness: [name] is a 58 y.o. female with a history of CAD, status post prior PCI to the LAD, diastolic CHF, obesity, diabetes, HTN, HL. Last seen by Dr. Irish Lack 07/2013. She was trying to get approval for lap band surgery at that time.    She returns today for the evaluation of left-sided chest discomfort and dyspnea with exertion. She has had symptoms for about 6 weeks now. She notes dyspnea with minimal activities. Overall, she is NYHA class 2b-3.  She notes left shoulder, left chest and substernal discomfort. She notes some radiation to her back as well as her right chest. Overall, it has been fairly constant. She denies any exertional chest discomfort. She denies orthopnea, PND. She has chronic LE edema without change. She denies syncope.   Studies:  - LHC (5/12):  Mid LAD stent patent, no sig CAD elsewhere, EF 60%  - Nuclear (11/08):  No ischemia, EF 78%; low risk  - Abdominal US (7/13):  No AAA   Recent Labs/Images:  07/25/2013: ALT 24; AST 20; HDL Cholesterol by NMR 38.50*; LDL (calc) 81  08/02/2013: BUN 9; Creatinine 0.7; Potassium 3.6; Sodium 135    US Abdomen Complete   08/26/2011  Abdominal aorta:  No aneurysm identified.  IMPRESSION:  1.  No gallbladder abnormality in exam limited by patient body habitus. 2.  Normal common bile duct.  Original Report Authenticated By: Suzy Bouchard, M.D.   Wt Readings from Last 3 Encounters:  11/06/13 309 lb (140.161 kg)  07/17/13 303 lb 12.8 oz (137.803 kg)  09/11/11 318 lb (144.244 kg)     Past Medical History  Diagnosis Date  . Arthritis   . Asthma   . CHF (congestive heart failure)   . Diabetes mellitus   . GERD (gastroesophageal reflux disease)   . Hyperlipidemia   . Hypertension   . Morbid obesity     Current Outpatient  Prescriptions  Medication Sig Dispense Refill  . amLODipine (NORVASC) 10 MG tablet Take 1 tablet (10 mg total) by mouth daily.  30 tablet  2  . aspirin 325 MG tablet Take 325 mg by mouth daily.      Marland Kitchen atenolol (TENORMIN) 25 MG tablet Take 1 tablet (25 mg total) by mouth daily.  30 tablet  5  . clopidogrel (PLAVIX) 75 MG tablet Take 75 mg by mouth once.       . Coenzyme Q10 (CO Q 10 PO) Take by mouth daily.      Marland Kitchen dexlansoprazole (DEXILANT) 60 MG capsule Take 1 capsule (60 mg total) by mouth daily.  30 capsule  11  . fexofenadine (ALLEGRA) 180 MG tablet Take 180 mg by mouth daily.      . Fluticasone-Salmeterol (ADVAIR) 100-50 MCG/DOSE AEPB Inhale 1 puff into the lungs 2 (two) times daily.      Marland Kitchen FREESTYLE LITE test strip       . furosemide (LASIX) 40 MG tablet Take 2 tablets po daily.  60 tablet  5  . HUMALOG MIX 75/25 (75-25) 100 UNIT/ML SUSP injection As directed      . lisinopril (PRINIVIL,ZESTRIL) 40 MG tablet 1 tablet daily  30 tablet  11  . nitroGLYCERIN (NITROSTAT) 0.4 MG SL tablet Place 1 tablet (0.4 mg total) under the tongue  every 5 (five) minutes as needed.  25 tablet  5  . potassium chloride (KLOR-CON M10) 10 MEQ tablet Take 1 tablet (10 mEq total) by mouth 2 (two) times daily.  60 tablet  6  . rosuvastatin (CRESTOR) 20 MG tablet Take 1 tablet (20 mg total) by mouth daily.  30 tablet  3  . TANZEUM 30 MG PEN once a week.       No current facility-administered medications for this visit.     Allergies:   Crestor and Sulfur   Social History:  The patient  reports that she quit smoking about 31 years ago. She does not have any smokeless tobacco history on file. She reports that she does not drink alcohol or use illicit drugs.   Family History:  The patient's family history includes Heart attack in her father; Kidney disease in her father; Stroke in her father, maternal grandfather, and paternal grandfather.   ROS:  Please see the history of present illness.       All other systems  reviewed and negative.    PHYSICAL EXAM: VS:  BP 160/70  Pulse 74  Ht 5' 2.5" (1.588 m)  Wt 309 lb (140.161 kg)  BMI 55.58 kg/m2 Well nourished, well developed, in no acute distress HEENT: normal Neck: I cannot appreciate JVD Cardiac:  normal S1, S2;  RRR; no murmurno S3 Chest: Tenderness to palpation over the left chest Lungs: decreased breath sounds bilaterally, no wheezing, rhonchi or rales Abd: soft, nontender, no hepatomegaly Ext: trace to 1+ bilateral LE edema Skin: warm and dry Neuro:  CNs 2-12 intact, no focal abnormalities noted  EKG:  NSR, HR 74, normal axis, no ST changes, no change from prior tracing      ASSESSMENT AND PLAN:  1. Chest pain, unspecified:  Somewhat atypical. However, she does note increased dyspnea with exertion. She tells me that she had increased dyspnea prior to her PCI. Last cardiac catheterization in 2012 demonstrated a patent stent and no significant CAD elsewhere. ECG remains normal. She does have constant symptoms over the last several days. I will obtain a stat troponin. As long as this is negative, we will proceed with an ETT-Myoview. 2. Atherosclerosis of native coronary artery of native heart without angina pectoris:  As noted, obtain stat troponin. Proceed with ETT-Myoview. If troponin is abnormal, she will be sent to the emergency room for admission and probable cardiac catheterization. 3. Chronic diastolic heart failure:  Exam is difficult to determine whether or not she is volume overloaded. Her weight is increased and she does note increased dyspnea. I will increase her Lasix to 60 mg twice a day for 3 days. I will have her take an extra potassium tablet x3 days as well. She will then resume her normal dose of Lasix and potassium. Obtain  a basic metabolic panel and BNP today. If her BNP is significantly elevated, consider echocardiogram. 4. Essential hypertension, benign:  Blood pressure is uncontrolled. DC atenolol. Start carvedilol 3.125 mg  twice a day. 5. Mixed hyperlipidemia:  Continue statin.  Disposition:   FU with Dr. Irish Lack or me in 2 weeks.   Signed, Versie Starks, MHS 11/06/2013 12:06 PM    [address] [place] [address], [place], [place]  [address] Phone: [phone]; Fax: [phone]

## 2013-11-07 ENCOUNTER — Telehealth: Payer: Self-pay | Admitting: Physician Assistant

## 2013-11-07 NOTE — Telephone Encounter (Signed)
lvm w/woman at hime # for ptcb. I then lmom on cell # lab work ok, if any questions cb [phone]

## 2013-11-07 NOTE — Telephone Encounter (Signed)
New message ° ° ° ° °Want lab results °

## 2013-11-09 ENCOUNTER — Ambulatory Visit (HOSPITAL_COMMUNITY): Payer: 59 | Attending: Cardiology | Admitting: Radiology

## 2013-11-09 VITALS — BP 125/76 | Ht 62.5 in | Wt 305.0 lb

## 2013-11-09 DIAGNOSIS — Z955 Presence of coronary angioplasty implant and graft: Secondary | ICD-10-CM | POA: Diagnosis not present

## 2013-11-09 DIAGNOSIS — I25119 Atherosclerotic heart disease of native coronary artery with unspecified angina pectoris: Secondary | ICD-10-CM | POA: Diagnosis not present

## 2013-11-09 DIAGNOSIS — I2510 Atherosclerotic heart disease of native coronary artery without angina pectoris: Secondary | ICD-10-CM

## 2013-11-09 DIAGNOSIS — R0600 Dyspnea, unspecified: Secondary | ICD-10-CM | POA: Diagnosis present

## 2013-11-09 DIAGNOSIS — I5032 Chronic diastolic (congestive) heart failure: Secondary | ICD-10-CM

## 2013-11-09 DIAGNOSIS — E119 Type 2 diabetes mellitus without complications: Secondary | ICD-10-CM | POA: Insufficient documentation

## 2013-11-09 DIAGNOSIS — I10 Essential (primary) hypertension: Secondary | ICD-10-CM | POA: Diagnosis not present

## 2013-11-09 DIAGNOSIS — J45909 Unspecified asthma, uncomplicated: Secondary | ICD-10-CM | POA: Insufficient documentation

## 2013-11-09 DIAGNOSIS — Z794 Long term (current) use of insulin: Secondary | ICD-10-CM | POA: Diagnosis not present

## 2013-11-09 DIAGNOSIS — R0602 Shortness of breath: Secondary | ICD-10-CM

## 2013-11-09 DIAGNOSIS — R079 Chest pain, unspecified: Secondary | ICD-10-CM | POA: Diagnosis present

## 2013-11-09 MED ORDER — REGADENOSON 0.4 MG/5ML IV SOLN
0.4000 mg | Freq: Once | INTRAVENOUS | Status: AC
Start: 2013-11-09 — End: 2013-11-09
  Administered 2013-11-09: 0.4 mg via INTRAVENOUS

## 2013-11-09 MED ORDER — TECHNETIUM TC 99M SESTAMIBI GENERIC - CARDIOLITE
30.0000 | Freq: Once | INTRAVENOUS | Status: AC | PRN
  Administered 2013-11-09: 30 via INTRAVENOUS

## 2013-11-09 NOTE — Progress Notes (Signed)
[facility] [address] [place], [place] [address] [phone]    Cardiology Nuclear Med Study  LUDIA GARTLAND is a 58 y.o. female     MRN : [id number]     DOB: September 12, 1955  Procedure Date: 11/09/2013  Nuclear Med Background Indication for Stress Test:  Evaluation for Ischemia, Stent Patency and Troponin negative on 11-06-2013 History:  Asthma and CAD, 11/08 MPI: EF: 78% NL Cardiac Risk Factors: Hypertension and IDDM   Symptoms:  Chest Pain and DOE   Nuclear Pre-Procedure Caffeine/Decaff Intake:  None> 12 hrs NPO After: 10:30pm   Lungs:  clear O2 Sat: 97% on room air. IV 0.9% NS with Angio Cath:  22g  IV Site: R Antecubital x 1, tolerated well IV Started by:  Irven Baltimore, RN  Chest Size (in):  46 Cup Size: DD  Height: 5' 2.5" (1.588 m)  Weight:  305 lb (138.347 kg)  BMI:  Body mass index is 54.86 kg/(m^2). Tech Comments:  1/2 dose of Humalog Insulin last night, no insulin this am. Fasting CBG was 187 at 0530 today per patient. Held coreg x 24 hrs. Irven Baltimore, RN.    Nuclear Med Study 1 or 2 day study: 2 day  Stress Test Type:  Carlton Adam  Reading MD: N/A  Order Authorizing Provider:  Larae Grooms, MD, and Richardson Dopp, [facility]  Resting Radionuclide: Technetium 79m Sestamibi  Resting Radionuclide Dose: 33.0 mCi on 11/10/13   Stress Radionuclide:  Technetium 60m Sestamibi  Stress Radionuclide Dose: 33.0 mCi on 11/09/13           Stress Protocol Rest HR: 81 Stress HR: 99  Rest BP: 125/76 Stress BP: 162/85  Exercise Time (min): n/a METS: n/a   Predicted Max HR: 163 bpm % Max HR: 60.74 bpm Rate Pressure Product: 16038   Dose of Adenosine (mg):  n/a Dose of Lexiscan: 0.4 mg  Dose of Atropine (mg): n/a Dose of Dobutamine: n/a mcg/kg/min (at max HR)  Stress Test Technologist: Perrin Maltese, EMT-P  Nuclear Technologist:  Earl Many, CNMT     Rest Procedure:  Myocardial perfusion imaging was performed at rest 45 minutes following the  intravenous administration of Technetium 21m Sestamibi. Rest ECG: NSR - Normal EKG  Stress Procedure:  The patient received IV Lexiscan 0.4 mg over 15-seconds.  Technetium 65m Sestamibi injected at 30-seconds. This patient had sob and chest discomfort with the Lexiscan injection. Quantitative spect images were obtained after a 45 minute delay. Stress ECG: No significant change from baseline ECG  QPS Raw Data Images: Soft tissue (diaprhagm,  Breast, bowel activity) surround heart.   Stress Images:  Large areas of thinning in the anteroseptal wall (mid, distal), inferolateral wall (base, mid), anterolateal wall (base, mid) and apex.  Normal perfusion elsewhere.   Rest Images:  Improvement in the inferolateal, anterolateral distributions.  Anteroseptal defect persists.   Subtraction (SDS):  Borderline by quantitation.  Transient Ischemic Dilatation (Normal <1.22):  1.05 Lung/Heart Ratio (Normal <0.45):  0.24  Quantitative Gated Spect Images QGS EDV:  106 ml QGS ESV:  38 ml  Impression Exercise Capacity:  Lexiscan with no exercise. BP Response:  Normal blood pressure response. Clinical Symptoms:  Mild chest pain/dyspnea. ECG Impression:  No significant ST segment change suggestive of ischemia. Comparison with Prior Nuclear Study: Scan from 2008 reported normal.    Overall Impression:  Myoview with evidence of medium area of ischemia in the anterolateral and inferolateral distributions, cannot completely exclude some shifting soft  tissue attenuation (breast)  ANteroseptal defect consistent with probable soft tissue attenuation (breast).  Abnormal study.    LV Ejection Fraction: 64%.  LV Wall Motion:  NL LV Function; NL Wall Motion  [name]

## 2013-11-10 ENCOUNTER — Ambulatory Visit (HOSPITAL_COMMUNITY): Payer: 59 | Attending: Internal Medicine

## 2013-11-10 DIAGNOSIS — R0989 Other specified symptoms and signs involving the circulatory and respiratory systems: Secondary | ICD-10-CM

## 2013-11-10 MED ORDER — TECHNETIUM TC 99M SESTAMIBI GENERIC - CARDIOLITE
33.0000 | Freq: Once | INTRAVENOUS | Status: AC | PRN
  Administered 2013-11-10: 33 via INTRAVENOUS

## 2013-11-13 ENCOUNTER — Encounter: Payer: Self-pay | Admitting: Physician Assistant

## 2013-11-18 ENCOUNTER — Other Ambulatory Visit: Payer: Self-pay | Admitting: Interventional Cardiology

## 2013-11-22 ENCOUNTER — Telehealth: Payer: Self-pay | Admitting: *Deleted

## 2013-11-22 NOTE — Telephone Encounter (Signed)
pt notified about myoview results with vebal understanding. Pt does still c/o of some chest discomfort. Pt advised to go to ED if gets worse before her appt 10/15 w/Dr. Irish Lack. Pt agreeable to Plan of Care.

## 2013-11-23 ENCOUNTER — Ambulatory Visit (INDEPENDENT_AMBULATORY_CARE_PROVIDER_SITE_OTHER): Payer: 59 | Admitting: Interventional Cardiology

## 2013-11-23 ENCOUNTER — Ambulatory Visit (HOSPITAL_COMMUNITY): Payer: 59 | Attending: Interventional Cardiology | Admitting: Cardiology

## 2013-11-23 ENCOUNTER — Encounter: Payer: Self-pay | Admitting: Interventional Cardiology

## 2013-11-23 VITALS — BP 140/90 | HR 90 | Ht 62.5 in | Wt 313.4 lb

## 2013-11-23 DIAGNOSIS — E782 Mixed hyperlipidemia: Secondary | ICD-10-CM

## 2013-11-23 DIAGNOSIS — I10 Essential (primary) hypertension: Secondary | ICD-10-CM | POA: Insufficient documentation

## 2013-11-23 DIAGNOSIS — M79605 Pain in left leg: Secondary | ICD-10-CM

## 2013-11-23 DIAGNOSIS — E669 Obesity, unspecified: Secondary | ICD-10-CM

## 2013-11-23 DIAGNOSIS — E119 Type 2 diabetes mellitus without complications: Secondary | ICD-10-CM | POA: Diagnosis not present

## 2013-11-23 DIAGNOSIS — M7989 Other specified soft tissue disorders: Secondary | ICD-10-CM

## 2013-11-23 DIAGNOSIS — M79606 Pain in leg, unspecified: Secondary | ICD-10-CM | POA: Insufficient documentation

## 2013-11-23 DIAGNOSIS — I25118 Atherosclerotic heart disease of native coronary artery with other forms of angina pectoris: Secondary | ICD-10-CM

## 2013-11-23 DIAGNOSIS — I2510 Atherosclerotic heart disease of native coronary artery without angina pectoris: Secondary | ICD-10-CM | POA: Diagnosis not present

## 2013-11-23 MED ORDER — CLOPIDOGREL BISULFATE 75 MG PO TABS: 75.0000 mg | ORAL_TABLET | Freq: Once | ORAL | Status: DC

## 2013-11-23 NOTE — Progress Notes (Signed)
Unilateral lower venous duplex performed  

## 2013-11-23 NOTE — Patient Instructions (Addendum)
Your physician has requested that you have a lower or upper extremity venous duplex today. This test is an ultrasound of the veins in the legs or arms. It looks at venous blood flow that carries blood from the heart to the legs or arms. Allow one hour for a Lower Venous exam. Allow thirty minutes for an Upper Venous exam. There are no restrictions or special instructions.  Your physician recommends that you continue on your current medications as directed. Please refer to the Current Medication list given to you today.  Your physician recommends that you schedule a follow-up appointment in:  2 months with Dr. Irish Lack.

## 2013-11-23 NOTE — Progress Notes (Signed)
Patient ID: EMELIN DASCENZO, female   DOB: 1956-01-13, 58 y.o.   MRN: [id number] Patient ID: ELLYANNA HOLTON, female   DOB: 02/09/1956, 58 y.o.   MRN: [id number]    [place], [place], [place]  [address] Phone: [phone] Fax:  [phone]  Date:  11/23/2013   ID:  GLENDIA OLSHEFSKI, DOB May 08, 1955, MRN [id number]  PCP:  Reginia Naas, MD      History of Present Illness: ANALYNN DAUM is a 58 y.o. female who has had issues with obesity and CAD. Most recent cath showed patent LAD stent in May 2012. She has lost weight. SHe does feel that she is getting better exercise tolerance with more walking. The burning in her left chest is gone.  She was approved for lap band surgery. She has seen Dr. Hassell Done. There were issues with her insurance covering the surgery but she thinks these will be resolved. Surgery is of for the time being.   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 Has some lower sternal discomfort x 5 since last visit, but thinks it's related to stress and GERD.  Denies dizziness, syncope, palpitations, no SHOB, orthopnea, PND. No bleeding. Walking once a week for 3min w/o chest pain and Shob. BP is around 150/70s.   She was seen for atypical chest pain.  That pain was different from her prior angina before the stent.  She had a  Stress test and it was equivocal.  She has had some intermittent leg swelling with burning in the skin of her legs.  She took extra lasix for three 3 days. Since then, She has been taking her usual dose of Lasix.  The current type of chest pain is still happening almost daily.  It has been moving around her chest.  SOmetimes in the left side of the chest, sometimes in the right side of the chest.    Wt Readings from Last 3 Encounters:  11/23/13 313 lb 6.4 oz (142.157 kg)  11/09/13 305 lb (138.347 kg)  11/06/13 309 lb (140.161 kg)     Past Medical History  Diagnosis Date  . Arthritis   . Asthma   . CHF (congestive heart failure)   .  Diabetes mellitus   . GERD (gastroesophageal reflux disease)   . Hyperlipidemia   . Hypertension   . Morbid obesity   . Hx of cardiovascular stress test     Lexiscan Myoview (10/15):  Medium area of ischemia in AL and IL distribution, cannot completely exclude shifting breast attenuation, EF 64%; Abnormal study    Current Outpatient Prescriptions  Medication Sig Dispense Refill  . Albiglutide (TANZEUM) 50 MG PEN Inject 50 mg into the skin once a week.      Marland Kitchen amLODipine (NORVASC) 10 MG tablet TAKE ONE TABLET BY MOUTH ONCE DAILY  30 tablet  11  . aspirin 325 MG tablet Take 325 mg by mouth daily.      . carvedilol (COREG) 3.125 MG tablet Take 1 tablet (3.125 mg total) by mouth 2 (two) times daily.  60 tablet  11  . clopidogrel (PLAVIX) 75 MG tablet Take 75 mg by mouth once.       . Coenzyme Q10 (CO Q 10 PO) Take by mouth daily.      Marland Kitchen dexlansoprazole (DEXILANT) 60 MG capsule Take 1 capsule (60 mg total) by mouth daily.  30 capsule  11  . Fluticasone-Salmeterol (ADVAIR) 100-50 MCG/DOSE AEPB Inhale 1 puff into the lungs 2 (  two) times daily.      Marland Kitchen FREESTYLE LITE test strip 1 each by Other route as needed.       . furosemide (LASIX) 40 MG tablet TAKE TWO TABLETS BY MOUTH ONCE DAILY  60 tablet  11  . HUMALOG MIX 75/25 (75-25) 100 UNIT/ML SUSP injection Inject 100 Units into the skin 2 (two) times daily with a meal. As directed      . lisinopril (PRINIVIL,ZESTRIL) 40 MG tablet 1 tablet daily  30 tablet  11  . nitroGLYCERIN (NITROSTAT) 0.4 MG SL tablet Place 1 tablet (0.4 mg total) under the tongue every 5 (five) minutes as needed.  25 tablet  5  . potassium chloride (KLOR-CON M10) 10 MEQ tablet Take 1 tablet (10 mEq total) by mouth 2 (two) times daily.  60 tablet  6  . rosuvastatin (CRESTOR) 20 MG tablet Take 1 tablet (20 mg total) by mouth daily.  30 tablet  3   No current facility-administered medications for this visit.    Allergies:    Allergies  Allergen Reactions  . Sulfa Antibiotics  Anaphylaxis  . Bystolic [Nebivolol Hcl]   . Crestor [Rosuvastatin]     Couldn't tolerate Crestor 40 mg dose   . Metformin And Related Diarrhea    Social History:  The patient  reports that she quit smoking about 31 years ago. She does not have any smokeless tobacco history on file. She reports that she does not drink alcohol or use illicit drugs.   Family History:  The patient's family history includes Heart attack in her father; Kidney disease in her father; Stroke in her father, maternal grandfather, and paternal grandfather.   ROS:  Please see the history of present illness.  No nausea, vomiting.  No fevers, chills.  No focal weakness.  No dysuria.    All other systems reviewed and negative.   PHYSICAL EXAM: VS:  BP 140/90  Pulse 90  Ht 5' 2.5" (1.588 m)  Wt 313 lb 6.4 oz (142.157 kg)  BMI 56.37 kg/m2 Well nourished, well developed, in no acute distress HEENT: normal Neck: no JVD, no carotid bruits Cardiac:  normal S1, S2; RRR;  Lungs:  clear to auscultation bilaterally, no wheezing, rhonchi or rales Abd: soft, nontender, no hepatomegaly Ext: no edema Skin: warm and dry Neuro:   no focal abnormalities noted      ASSESSMENT AND PLAN:  Coronary atherosclerosis of native coronary artery  Continue Plavix Tablet, 75 MG, 1 tablet, Orally, Once a day Decreased Aspirin Tablet, 81 MG, 1 tablet, Orally, Once a day IMAGING: EKG    Overton,Shana 09/20/2012 04:01:16 PM > Yarima Penman,JAY 09/20/2012 04:15:30 PM > NSR, PRWP   Notes: Patent LAD stent at the time of the last catheterization.  Personally reviewed the stress images. Atypical sx with equivocal stress test.  Sx nonexertional.  Hold of on cath at this time.  If sx worsen or she uses NTG frequently, plan for cath.    2. Morbid obesity  Notes: She has had difficulty losing weight. She is open to considering a surguical option for weight loss. Was evaluated for lap band surgery. Needs to go through this process again.  We spoke about  the importance of watching her diet. She admits to lapses in her diet.    3. Hyperlipemia, mixed  Continue Crestor Tablet, 20 MG, 1 tablet, Orally, Once a day, 90 days, 90, Refills 3 Notes: SHe did not tolerate lipitor in the past. LDL 123. LDL target less than 100.  She had been forgetting some doses. LDL 117 in 5/15.     4. Benign hypertension  Continue Amlodipine Besylate Tablet, 10 MG, 1 tablet, Orally, Once a day, 30, Refills 11 Notes: Back to taking her meds. BP Still high. Check BP at home. Increased lisinopril to 40 mg daily.  Take Lasix as previously prescribed.     5. Edema: Check LE Doppler for DVT.  L>R swelling.  Preventive Medicine  Adult topics discussed:  Diet: healthy diet, low calorie, low fat.  Exercise: 5 days a week, at least 30 minutes of aerobic exercise.    F/u in 6 months  Signed, Mina Marble, MD, [facility] 11/23/2013 8:53 AM

## 2013-11-28 NOTE — Progress Notes (Signed)
Patient informed. 

## 2013-12-06 ENCOUNTER — Telehealth: Payer: Self-pay | Admitting: Interventional Cardiology

## 2013-12-06 NOTE — Telephone Encounter (Signed)
Walk in pt Form " Disability Pla- Card" Dr.Varanasi back 10.29 Lauren Covering      10.28.15/km

## 2013-12-12 ENCOUNTER — Telehealth: Payer: Self-pay | Admitting: Interventional Cardiology

## 2013-12-12 NOTE — Telephone Encounter (Signed)
New message    Patient calling regarding signing off handicap sticker. Forms was drop off

## 2013-12-12 NOTE — Telephone Encounter (Signed)
Spoke with patient who states she dropped off Application for Disability Parking Placard in an envelope with pink highlighter on it for Dr. Irish Lack to sign; states she has already filled out her information.  Patient states this is a renewal as her current placard has expired.  I advised patient that I cannot locate the envelope she is describing but that our office has blank forms.  I advised patient that if she is able to wait until tomorrow, Alvis Lemmings will be back in the office and she has been handling Dr. Hassell Done paperwork and may know where the patient's paperwork is.  Patient states she is able to wait until tomorrow.  I am placing a blank application for Dr. Hassell Done signature today and am routing message to Dr. Irish Lack and Alvis Lemmings, RN.

## 2013-12-13 NOTE — Telephone Encounter (Signed)
I left a message for the patient to call. 

## 2013-12-13 NOTE — Telephone Encounter (Signed)
[name] had Dr. Irish Lack complete the Parking Placard form yesterday. I spoke with the patient and she is aware this is available for pick up.

## 2014-01-10 ENCOUNTER — Ambulatory Visit (INDEPENDENT_AMBULATORY_CARE_PROVIDER_SITE_OTHER): Payer: 59 | Admitting: Interventional Cardiology

## 2014-01-10 ENCOUNTER — Encounter: Payer: Self-pay | Admitting: Interventional Cardiology

## 2014-01-10 VITALS — BP 164/92 | HR 85 | Ht 62.5 in | Wt 313.8 lb

## 2014-01-10 DIAGNOSIS — I25118 Atherosclerotic heart disease of native coronary artery with other forms of angina pectoris: Secondary | ICD-10-CM

## 2014-01-10 DIAGNOSIS — I10 Essential (primary) hypertension: Secondary | ICD-10-CM

## 2014-01-10 NOTE — Patient Instructions (Signed)
Your physician recommends that you continue on your current medications as directed. Please refer to the Current Medication list given to you today.  Your physician recommends that you schedule a follow-up appointment in:  3 MONTHS WITH DR VARANASI 

## 2014-01-10 NOTE — Progress Notes (Signed)
Patient ID: [name], female   DOB: Oct 04, 1955, 58 y.o.   MRN: [id number]    [facility], [place], [place]  [address] Phone: [phone] Fax:  [phone]  Date:  01/10/2014   ID:  [name], DOB 05-29-1955, MRN [id number]  PCP:  Kristin Naas, MD      History of Present Illness: [name] is a 58 y.o. female who has had issues with obesity and CAD. Most recent cath showed patent LAD stent in May 2012. She has lost weight. SHe does feel that she is getting better exercise tolerance with more walking. The burning in her left chest is gone.  She was approved for lap band surgery. She has seen Dr. Hassell Done. There were issues with her insurance covering the surgery but she thinks these will be resolved. Surgery is of for the time being.   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 Has some chest discomfort since last visit (see below), but thinks it's related to stress and GERD.  Denies dizziness, syncope, palpitations, no SHOB, orthopnea, PND. No bleeding. Walking once a week for 68min w/o chest pain and Shob. BP is around 150/70s.   She was seen for atypical chest pain.  That pain was different from her prior angina before the stent.  She had a  Stress test and it was equivocal.  She has had some intermittent leg swelling with burning in the skin of her legs.  She took extra lasix for three 3 days. Since then, She has been taking her usual dose of Lasix.  The current type of chest pain is still happening almost daily.  It has been moving around her chest.  SOmetimes in the left side of the chest, sometimes in the right side of the chest.    Worse with palpation.  Different than her prior anginal equivalent.   Wt Readings from Last 3 Encounters:  01/10/14 313 lb 12.8 oz (142.339 kg)  11/23/13 313 lb 6.4 oz (142.157 kg)  11/09/13 305 lb (138.347 kg)     Past Medical History  Diagnosis Date  . Arthritis   . Asthma   . CHF (congestive heart failure)   . Diabetes  mellitus   . GERD (gastroesophageal reflux disease)   . Hyperlipidemia   . Hypertension   . Morbid obesity   . Hx of cardiovascular stress test     Lexiscan Myoview (10/15):  Medium area of ischemia in AL and IL distribution, cannot completely exclude shifting breast attenuation, EF 64%; Abnormal study    Current Outpatient Prescriptions  Medication Sig Dispense Refill  . Albiglutide (TANZEUM) 50 MG PEN Inject 50 mg into the skin once a week.     Marland Kitchen amLODipine (NORVASC) 10 MG tablet TAKE ONE TABLET BY MOUTH ONCE DAILY 30 tablet 11  . aspirin 325 MG tablet Take 325 mg by mouth daily.    . carvedilol (COREG) 3.125 MG tablet Take 1 tablet (3.125 mg total) by mouth 2 (two) times daily. 60 tablet 11  . clopidogrel (PLAVIX) 75 MG tablet Take 1 tablet (75 mg total) by mouth once. 90 tablet 3  . dexlansoprazole (DEXILANT) 60 MG capsule Take 1 capsule (60 mg total) by mouth daily. 30 capsule 11  . Fluticasone-Salmeterol (ADVAIR) 100-50 MCG/DOSE AEPB Inhale 1 puff into the lungs 2 (two) times daily.    Marland Kitchen FREESTYLE LITE test strip 1 each by Other route as needed.     . furosemide (LASIX) 40 MG  tablet TAKE TWO TABLETS BY MOUTH ONCE DAILY 60 tablet 11  . HUMULIN R 500 UNIT/ML SOLN injection Inject 100 Units into the skin 2 (two) times daily with a meal.     . lisinopril (PRINIVIL,ZESTRIL) 40 MG tablet 1 tablet daily 30 tablet 11  . nitroGLYCERIN (NITROSTAT) 0.4 MG SL tablet Place 1 tablet (0.4 mg total) under the tongue every 5 (five) minutes as needed. 25 tablet 5  . potassium chloride (KLOR-CON M10) 10 MEQ tablet Take 1 tablet (10 mEq total) by mouth 2 (two) times daily. 60 tablet 6  . rosuvastatin (CRESTOR) 20 MG tablet Take 1 tablet (20 mg total) by mouth daily. 30 tablet 3   No current facility-administered medications for this visit.    Allergies:    Allergies  Allergen Reactions  . Sulfa Antibiotics Anaphylaxis  . Bystolic [Nebivolol Hcl]   . Crestor [Rosuvastatin]     Couldn't tolerate  Crestor 40 mg dose   . Metformin And Related Diarrhea    Social History:  The patient  reports that she quit smoking about 31 years ago. She does not have any smokeless tobacco history on file. She reports that she does not drink alcohol or use illicit drugs.   Family History:  The patient's family history includes Heart attack in her father; Kidney disease in her father; Stroke in her father, maternal grandfather, and paternal grandfather.   ROS:  Please see the history of present illness.  No nausea, vomiting.  No fevers, chills.  No focal weakness.  No dysuria.    All other systems reviewed and negative.   PHYSICAL EXAM: VS:  BP 164/92 mmHg  Pulse 85  Ht 5' 2.5" (1.588 m)  Wt 313 lb 12.8 oz (142.339 kg)  BMI 56.44 kg/m2 Well nourished, well developed, in no acute distress HEENT: normal Neck: no JVD, no carotid bruits Cardiac:  normal S1, S2; RRR; chest pain is reproducible with palpation Lungs:  clear to auscultation bilaterally, no wheezing, rhonchi or rales Abd: soft, nontender, no hepatomegaly Ext: tr bilateral pitting edema Skin: warm and dry Neuro:   no focal abnormalities noted Psych: normal affect  ASSESSMENT AND PLAN:  Coronary atherosclerosis of native coronary artery  Continue Plavix Tablet, 75 MG, 1 tablet, Orally, Once a day Decreased Aspirin Tablet, 81 MG, 1 tablet, Orally, Once a day IMAGING: EKG    Overton,Shana 09/20/2012 04:01:16 PM > Marrion Finan,JAY 09/20/2012 04:15:30 PM > NSR, PRWP   Notes: Patent LAD stent at the time of the last catheterization.  Personally reviewed the stress images. Atypical sx with equivocal stress test.  Pain with palpation of chest.  Sx nonexertional.  Hold of on cath at this time.  If sx worsen or she uses NTG frequently, plan for cath. She is comfortable holding off.  WIll reconsider ischemia evaluation when her bariatric surgery plan is more defined.    2. Morbid obesity  Notes: She has had difficulty losing weight. She is open to  considering a surguical option for weight loss. Was evaluated for lap band surgery. Needs to go through this process again.  We spoke about the importance of watching her diet. She admits to lapses in her diet.    3. Hyperlipemia, mixed  Continue Crestor Tablet, 20 MG, 1 tablet, Orally, Once a day, 90 days, 90, Refills 3 Notes: SHe did not tolerate lipitor in the past. LDL 123. LDL target less than 100. She had been forgetting some doses. LDL 117 in 5/15.     4.  Benign hypertension  Continue Amlodipine Besylate Tablet, 10 MG, 1 tablet, Orally, Once a day, 30, Refills 11 Notes: Back to taking her meds. BP Still high. Check BP at home. Increased lisinopril to 40 mg daily.  Take Lasix as previously prescribed.     5. Edema: Check LE Doppler for DVT.  L>R swelling.  Preventive Medicine  Adult topics discussed:  Diet: healthy diet, low calorie, low fat.  Exercise: 5 days a week, at least 30 minutes of aerobic exercise.    F/u in 6 months  Signed, Mina Marble, MD, [facility] 01/10/2014 4:35 PM

## 2014-01-24 ENCOUNTER — Other Ambulatory Visit (INDEPENDENT_AMBULATORY_CARE_PROVIDER_SITE_OTHER): Payer: 59 | Admitting: *Deleted

## 2014-01-24 DIAGNOSIS — E782 Mixed hyperlipidemia: Secondary | ICD-10-CM

## 2014-01-24 LAB — HEPATIC FUNCTION PANEL
ALT: 28 U/L (ref 0–35)
AST: 21 U/L (ref 0–37)
Albumin: 4.3 g/dL (ref 3.5–5.2)
Alkaline Phosphatase: 133 U/L — ABNORMAL HIGH (ref 39–117)
BILIRUBIN TOTAL: 0.6 mg/dL (ref 0.2–1.2)
Bilirubin, Direct: 0 mg/dL (ref 0.0–0.3)
Total Protein: 8.4 g/dL — ABNORMAL HIGH (ref 6.0–8.3)

## 2014-01-24 LAB — LIPID PANEL
CHOL/HDL RATIO: 6
CHOLESTEROL: 184 mg/dL (ref 0–200)
HDL: 32.1 mg/dL — ABNORMAL LOW (ref >39.00)
LDL CALC: 131 mg/dL — AB (ref 0–99)
NonHDL: 151.9
Triglycerides: 107 mg/dL (ref 0.0–149.0)
VLDL: 21.4 mg/dL (ref 0.0–40.0)

## 2014-01-30 ENCOUNTER — Other Ambulatory Visit: Payer: Self-pay | Admitting: *Deleted

## 2014-01-30 DIAGNOSIS — E782 Mixed hyperlipidemia: Secondary | ICD-10-CM

## 2014-02-05 ENCOUNTER — Other Ambulatory Visit: Payer: Self-pay | Admitting: Interventional Cardiology

## 2014-04-17 ENCOUNTER — Ambulatory Visit (INDEPENDENT_AMBULATORY_CARE_PROVIDER_SITE_OTHER): Payer: 59 | Admitting: Interventional Cardiology

## 2014-04-17 ENCOUNTER — Encounter: Payer: Self-pay | Admitting: Interventional Cardiology

## 2014-04-17 VITALS — BP 140/80 | HR 92 | Ht 62.5 in | Wt 309.0 lb

## 2014-04-17 DIAGNOSIS — E669 Obesity, unspecified: Secondary | ICD-10-CM

## 2014-04-17 DIAGNOSIS — I10 Essential (primary) hypertension: Secondary | ICD-10-CM

## 2014-04-17 DIAGNOSIS — E782 Mixed hyperlipidemia: Secondary | ICD-10-CM

## 2014-04-17 DIAGNOSIS — I2510 Atherosclerotic heart disease of native coronary artery without angina pectoris: Secondary | ICD-10-CM

## 2014-04-17 NOTE — Patient Instructions (Signed)
Your physician recommends that you continue on your current medications as directed. Please refer to the Current Medication list given to you today.  Your physician wants you to follow-up in: 9 months with Dr. Varanasi. You will receive a reminder letter in the mail two months in advance. If you don't receive a letter, please call our office to schedule the follow-up appointment.  

## 2014-04-17 NOTE — Progress Notes (Signed)
Patient ID: [name], female   DOB: October 10, 1955, 59 y.o.   MRN: [id number]    [facility], [place], [place]  [address] Phone: [phone] Fax:  [phone]  Date:  04/17/2014   ID:  [name], DOB 14-May-1955, MRN [id number]  PCP:  Reginia Naas, MD      History of Present Illness: [name] is a 59 y.o. female who has had issues with obesity and CAD. Most recent cath showed patent LAD stent in May 2012. She has lost weight. SHe does feel that she is getting better exercise tolerance with more walking. The burning in her left chest is gone.  She was approved for lap band surgery. She has seen Dr. Hassell Done. There were issues with her insurance covering the surgery but she thinks these will be resolved. Surgery is of for the time being.   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 Has some lower sternal discomfort x 5 since last visit, but thinks it's related to stress and GERD.  Denies dizziness, syncope, palpitations, no SHOB, orthopnea, PND. No bleeding. Walking once a week for 24min w/o chest pain and Shob. BP is around 150/70s.   She was seen for atypical chest pain.  That pain was different from her prior angina before the stent.  She had a  Stress test and it was equivocal.  She has had some intermittent leg swelling with burning in the skin of her legs.  She took extra lasix for three 3 days. Since then, She has been taking her usual dose of Lasix.  She has had multiple colds this year.  She has had a feeling of chest pressure from congestion.  It has been continuous for 2 weeks.    Wt Readings from Last 3 Encounters:  04/17/14 309 lb (140.161 kg)  01/10/14 313 lb 12.8 oz (142.339 kg)  11/23/13 313 lb 6.4 oz (142.157 kg)     Past Medical History  Diagnosis Date  . Arthritis   . Asthma   . CHF (congestive heart failure)   . Diabetes mellitus   . GERD (gastroesophageal reflux disease)   . Hyperlipidemia   . Hypertension   . Morbid obesity   . Hx of  cardiovascular stress test     Lexiscan Myoview (10/15):  Medium area of ischemia in AL and IL distribution, cannot completely exclude shifting breast attenuation, EF 64%; Abnormal study    Current Outpatient Prescriptions  Medication Sig Dispense Refill  . amLODipine (NORVASC) 10 MG tablet TAKE ONE TABLET BY MOUTH ONCE DAILY 30 tablet 11  . amoxicillin-clavulanate (AUGMENTIN) 875-125 MG per tablet Take 1 tablet by mouth every 12 (twelve) hours. For the next 10 days    . benzonatate (TESSALON) 100 MG capsule Take 1-2 capsules by mouth three times a day as needed for coughing    . carvedilol (COREG) 3.125 MG tablet Take 1 tablet (3.125 mg total) by mouth 2 (two) times daily. 60 tablet 11  . clopidogrel (PLAVIX) 75 MG tablet Take 1 tablet (75 mg total) by mouth once. 90 tablet 3  . dexlansoprazole (DEXILANT) 60 MG capsule Take 1 capsule (60 mg total) by mouth daily. 30 capsule 11  . Fluticasone-Salmeterol (ADVAIR) 100-50 MCG/DOSE AEPB Inhale 1 puff into the lungs 2 (two) times daily.    Marland Kitchen FREESTYLE LITE test strip 1 each by Other route as needed.     . furosemide (LASIX) 40 MG tablet TAKE TWO TABLETS BY MOUTH ONCE DAILY  60 tablet 11  . HUMULIN R 500 UNIT/ML SOLN injection Inject 20 Units into the skin 2 (two) times daily with a meal.     . HYDROcodone-homatropine (HYCODAN) 5-1.5 MG/5ML syrup 5-69mL by mouth at bedtime as needed for cough    . lisinopril (PRINIVIL,ZESTRIL) 40 MG tablet 1 tablet daily 30 tablet 11  . naproxen (NAPROSYN) 500 MG tablet Take 1 tablet by mouth 2 (two) times daily as needed.    . nitroGLYCERIN (NITROSTAT) 0.4 MG SL tablet Place 1 tablet (0.4 mg total) under the tongue every 5 (five) minutes as needed. 25 tablet 5  . potassium chloride (KLOR-CON M10) 10 MEQ tablet Take 1 tablet (10 mEq total) by mouth 2 (two) times daily. 60 tablet 6  . rosuvastatin (CRESTOR) 20 MG tablet Take 1 tablet (20 mg total) by mouth daily. 30 tablet 3  . aspirin 325 MG tablet Take 325 mg by  mouth daily.     No current facility-administered medications for this visit.    Allergies:    Allergies  Allergen Reactions  . Sulfa Antibiotics Anaphylaxis  . Bystolic [Nebivolol Hcl]   . Crestor [Rosuvastatin]     Couldn't tolerate Crestor 40 mg dose   . Metformin And Related Diarrhea    Social History:  The patient  reports that she quit smoking about 31 years ago. She does not have any smokeless tobacco history on file. She reports that she does not drink alcohol or use illicit drugs.   Family History:  The patient's family history includes Heart attack in her father; Kidney disease in her father; Stroke in her father, maternal grandfather, and paternal grandfather.   ROS:  Please see the history of present illness.  No nausea, vomiting.  No fevers, chills.  No focal weakness.  No dysuria.    All other systems reviewed and negative.   PHYSICAL EXAM: VS:  BP 140/80 mmHg  Pulse 92  Ht 5' 2.5" (1.588 m)  Wt 309 lb (140.161 kg)  BMI 55.58 kg/m2  SpO2 92% Well nourished, well developed, in no acute distress HEENT: normal Neck: no JVD, no carotid bruits Cardiac:  normal S1, S2; RRR;  Lungs:  clear to auscultation bilaterally, no wheezing, rhonchi or rales Abd: soft, nontender, no hepatomegaly Ext: mild bilateral edema Skin: warm and dry Neuro:   no focal abnormalities noted Psych:normal affect      ASSESSMENT AND PLAN:  Coronary atherosclerosis of native coronary artery  Continue Plavix Tablet, 75 MG, 1 tablet, Orally, Once a day Decreased Aspirin Tablet, 81 MG, 1 tablet, Orally, Once a day      Notes: Patent LAD stent at the time of the last catheterization.  Personally reviewed the stress images. Atypical sx with equivocal stress test.  Sx nonexertional.  Hold of on cath at this time.  If sx worsen or she uses NTG frequently, plan for cath.  Current sx sound like congestion from her cold.     2. Morbid obesity  Notes: She has had difficulty losing weight. She is  open to considering a surguical option for weight loss. Was evaluated for lap band surgery. Needs to go through this process again.  We spoke about the importance of watching her diet. She admits to lapses in her diet.    3. Hyperlipemia, mixed  Continue Crestor Tablet, 20 MG, 1 tablet, Orally, Once a day, 90 days, 90, Refills 3 Notes: SHe did not tolerate lipitor in the past. LDL 123. LDL target less than 100. She  had been forgetting some doses. LDL 117 in 5/15.   Now back to taking it regularly.   4. Benign hypertension  Continue Amlodipine Besylate Tablet, 10 MG, 1 tablet, Orally, Once a day, 30, Refills 11 Notes: Back to taking her meds. BP better today. Check BP at home. Increased lisinopril to 40 mg daily.  Take Lasix as previously prescribed.     Preventive Medicine  Adult topics discussed:  Diet: healthy diet, low calorie, low fat.  Exercise: 5 days a week, at least 30 minutes of aerobic exercise.    F/u in 6 months  Signed, Mina Marble, MD, [facility] 04/17/2014 4:14 PM

## 2014-06-16 ENCOUNTER — Other Ambulatory Visit: Payer: Self-pay | Admitting: Interventional Cardiology

## 2014-07-05 ENCOUNTER — Other Ambulatory Visit: Payer: Self-pay | Admitting: *Deleted

## 2014-07-05 MED ORDER — ROSUVASTATIN CALCIUM 20 MG PO TABS: 20.0000 mg | ORAL_TABLET | Freq: Every day | ORAL | Status: DC

## 2014-07-23 ENCOUNTER — Other Ambulatory Visit: Payer: Self-pay

## 2014-07-28 ENCOUNTER — Other Ambulatory Visit: Payer: Self-pay | Admitting: Interventional Cardiology

## 2014-08-30 ENCOUNTER — Other Ambulatory Visit: Payer: Self-pay | Admitting: Interventional Cardiology

## 2014-11-02 ENCOUNTER — Encounter: Payer: Self-pay | Admitting: Interventional Cardiology

## 2014-11-11 ENCOUNTER — Other Ambulatory Visit: Payer: Self-pay | Admitting: Physician Assistant

## 2015-01-05 ENCOUNTER — Other Ambulatory Visit: Payer: Self-pay | Admitting: Interventional Cardiology

## 2015-04-06 ENCOUNTER — Other Ambulatory Visit: Payer: Self-pay | Admitting: Interventional Cardiology

## 2015-05-24 ENCOUNTER — Telehealth: Payer: Self-pay | Admitting: Interventional Cardiology

## 2015-05-24 ENCOUNTER — Other Ambulatory Visit: Payer: Self-pay

## 2015-05-24 MED ORDER — FUROSEMIDE 40 MG PO TABS: 80.0000 mg | ORAL_TABLET | Freq: Every day | ORAL | Status: DC

## 2015-05-24 NOTE — Telephone Encounter (Signed)
°*  STAT* If patient is at the pharmacy, call can be transferred to refill team.   1. Which medications need to be refilled? (please list name of each medication and dose if known) Furosemide 40mg   2. Which pharmacy/location (including street and city if local pharmacy) is medication to be sent to?[facility] in [place]   3. Do they need a 30 day or 90 day supply? [name]

## 2015-06-08 ENCOUNTER — Other Ambulatory Visit: Payer: Self-pay | Admitting: Interventional Cardiology

## 2015-06-10 ENCOUNTER — Other Ambulatory Visit: Payer: Self-pay | Admitting: *Deleted

## 2015-06-10 MED ORDER — CLOPIDOGREL BISULFATE 75 MG PO TABS: 75.0000 mg | ORAL_TABLET | Freq: Every day | ORAL | Status: DC

## 2015-08-17 ENCOUNTER — Other Ambulatory Visit: Payer: Self-pay | Admitting: Interventional Cardiology

## 2015-08-27 ENCOUNTER — Encounter (INDEPENDENT_AMBULATORY_CARE_PROVIDER_SITE_OTHER): Payer: Self-pay

## 2015-08-27 ENCOUNTER — Ambulatory Visit (INDEPENDENT_AMBULATORY_CARE_PROVIDER_SITE_OTHER): Payer: 59 | Admitting: Interventional Cardiology

## 2015-08-27 ENCOUNTER — Encounter: Payer: Self-pay | Admitting: Interventional Cardiology

## 2015-08-27 VITALS — BP 150/90 | HR 85 | Ht 62.5 in | Wt 315.0 lb

## 2015-08-27 DIAGNOSIS — I10 Essential (primary) hypertension: Secondary | ICD-10-CM

## 2015-08-27 DIAGNOSIS — I2510 Atherosclerotic heart disease of native coronary artery without angina pectoris: Secondary | ICD-10-CM

## 2015-08-27 DIAGNOSIS — E782 Mixed hyperlipidemia: Secondary | ICD-10-CM | POA: Diagnosis not present

## 2015-08-27 DIAGNOSIS — I25118 Atherosclerotic heart disease of native coronary artery with other forms of angina pectoris: Secondary | ICD-10-CM | POA: Diagnosis not present

## 2015-08-27 DIAGNOSIS — E669 Obesity, unspecified: Secondary | ICD-10-CM

## 2015-08-27 MED ORDER — CARVEDILOL 3.125 MG PO TABS: 3.1250 mg | ORAL_TABLET | Freq: Two times a day (BID) | ORAL | Status: DC

## 2015-08-27 MED ORDER — LISINOPRIL 40 MG PO TABS: 40.0000 mg | ORAL_TABLET | Freq: Every day | ORAL | Status: DC

## 2015-08-27 MED ORDER — AMLODIPINE BESYLATE 10 MG PO TABS: 10.0000 mg | ORAL_TABLET | Freq: Every day | ORAL | Status: DC

## 2015-08-27 MED ORDER — CLOPIDOGREL BISULFATE 75 MG PO TABS
75.0000 mg | ORAL_TABLET | Freq: Every day | ORAL | Status: DC
Start: 2015-08-27 — End: 2016-07-16

## 2015-08-27 MED ORDER — ROSUVASTATIN CALCIUM 20 MG PO TABS
20.0000 mg | ORAL_TABLET | Freq: Every day | ORAL | Status: DC
Start: 2015-08-27 — End: 2016-07-16

## 2015-08-27 MED ORDER — POTASSIUM CHLORIDE CRYS ER 10 MEQ PO TBCR: 10.0000 meq | EXTENDED_RELEASE_TABLET | Freq: Two times a day (BID) | ORAL | Status: DC

## 2015-08-27 MED ORDER — FUROSEMIDE 40 MG PO TABS: 80.0000 mg | ORAL_TABLET | Freq: Every day | ORAL | Status: DC

## 2015-08-27 MED ORDER — NITROGLYCERIN 0.4 MG SL SUBL: 0.4000 mg | SUBLINGUAL_TABLET | SUBLINGUAL | Status: DC | PRN

## 2015-08-27 NOTE — Progress Notes (Signed)
Cardiology Office Note   Date:  08/27/2015   ID:  [name], DOB [birth year], MRN [id number]  PCP:  Reginia Naas, MD    No chief complaint on file. f/u CAD   Wt Readings from Last 3 Encounters:  08/27/15 315 lb (142.883 kg)  04/17/14 309 lb (140.161 kg)  01/10/14 313 lb 12.8 oz (142.339 kg)       History of Present Illness: [name] is a 60 y.o. female   who has had issues with obesity and CAD. Most recent cath showed patent LAD stent in May 2012. She has lost weight. SHe does feel that she is getting better exercise tolerance with more walking. The burning in her left chest is gone.  She was approved for lap band surgery. She has seen Dr. Hassell Done. There were issues with her insurance covering the surgery but she thinks these will be resolved. Surgery is off for the time being because eof her mother's illness.  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 She feels fatigued easily with walking Denies dizziness, syncope, palpitations, no SHOB, orthopnea, PND. No bleeding. Walking up to 10 minutes at a time.  Worse in the hot weather. BP is around 150/70s.    She has not been taking Crestor properly.    She thinks insurance will approve her surgery.  She is looking at [facility] in [place] to get the surgery.  She is in the process of starting an evaluation there.     Past Medical History  Diagnosis Date  . Arthritis   . Asthma   . CHF (congestive heart failure) ([place])   . Diabetes mellitus   . GERD (gastroesophageal reflux disease)   . Hyperlipidemia   . Hypertension   . Morbid obesity ([place])   . Hx of cardiovascular stress test     Lexiscan Myoview (10/15):  Medium area of ischemia in AL and IL distribution, cannot completely exclude shifting breast attenuation, EF 64%; Abnormal study    Past Surgical History  Procedure Laterality Date  . Uterine fibroid surgery  2008  . Achilles tendon repair  1995    right  . Heart stent  2007  .  Hand surgery      right     Current Outpatient Prescriptions  Medication Sig Dispense Refill  . amLODipine (NORVASC) 10 MG tablet Take 1 tablet (10 mg total) by mouth daily. 30 tablet 2  . aspirin 325 MG tablet Take 325 mg by mouth daily.    . carvedilol (COREG) 3.125 MG tablet TAKE ONE TABLET BY MOUTH TWICE DAILY 60 tablet 3  . clopidogrel (PLAVIX) 75 MG tablet Take 1 tablet (75 mg total) by mouth daily. 90 tablet 0  . dexlansoprazole (DEXILANT) 60 MG capsule Take 1 capsule (60 mg total) by mouth daily. 30 capsule 11  . Fluticasone-Salmeterol (ADVAIR) 100-50 MCG/DOSE AEPB Inhale 1 puff into the lungs 2 (two) times daily.    Marland Kitchen FREESTYLE LITE test strip 1 each by Other route as directed.     . furosemide (LASIX) 40 MG tablet Take 2 tablets (80 mg total) by mouth daily. 60 tablet 3  . HUMULIN R 500 UNIT/ML SOLN injection Inject 20 Units into the skin 2 (two) times daily with a meal.     . KLOR-CON M10 10 MEQ tablet TAKE ONE TABLET BY MOUTH TWICE DAILY 60 tablet 4  . lisinopril (PRINIVIL,ZESTRIL) 40 MG tablet TAKE ONE TABLET BY MOUTH ONCE DAILY 30 tablet  0  . naproxen (NAPROSYN) 500 MG tablet Take 1 tablet by mouth 2 (two) times daily as needed for mild pain or moderate pain.     . nitroGLYCERIN (NITROSTAT) 0.4 MG SL tablet Place 0.4 mg under the tongue every 5 (five) minutes as needed for chest pain (X 3 DOSES FOR CHEST PAIN).    Marland Kitchen rosuvastatin (CRESTOR) 20 MG tablet Take 1 tablet (20 mg total) by mouth daily. 30 tablet 6  . TRULICITY 1.5 0000000 SOPN Inject 1.5 mg as directed once a week. Reported on 08/27/2015     No current facility-administered medications for this visit.    Allergies:   Bystolic; Crestor; Sulfa antibiotics; and Metformin and related    Social History:  The patient  reports that she quit smoking about 33 years ago. She does not have any smokeless tobacco history on file. She reports that she does not drink alcohol or use illicit drugs.   Family History:  The  patient's family history includes Heart attack in her father; Kidney disease in her father; Stroke in her father, maternal grandfather, and paternal grandfather.    ROS:  Please see the history of present illness.   Otherwise, review of systems are positive for left leg swelling-chronic.   All other systems are reviewed and negative.    PHYSICAL EXAM: VS:  BP 150/90 mmHg  Pulse 85  Ht 5' 2.5" (1.588 m)  Wt 315 lb (142.883 kg)  BMI 56.66 kg/m2 , BMI Body mass index is 56.66 kg/(m^2). GEN: Well nourished, well developed, in no acute distress HEENT: normal Neck: no JVD, carotid bruits, or masses Cardiac: RRR; no murmurs, rubs, or gallops,blateral leg edema L>R Respiratory:  clear to auscultation bilaterally, normal work of breathing GI: soft, nontender, nondistended, + BS MS: no deformity or atrophy Skin: warm and dry, no rash Neuro:  Strength and sensation are intact Psych: euthymic mood, full affect   EKG:   The ekg ordered today demonstrates NSR, no ST changes   Recent Labs: No results found for requested labs within last 365 days.   Lipid Panel    Component Value Date/Time   CHOL 184 01/24/2014 1029   TRIG 107.0 01/24/2014 1029   HDL 32.10* 01/24/2014 1029   CHOLHDL 6 01/24/2014 1029   VLDL 21.4 01/24/2014 1029   LDLCALC 131* 01/24/2014 1029     Other studies Reviewed: Additional studies/ records that were reviewed today with results demonstrating: Patent LAD stent in 2012.   ASSESSMENT AND PLAN:  1. CAD :Patent LAD stent at the time of the last catheterization. Prior mild abnormality on nuclear stress test.   No sx with equivocal stress test. Hold of on cath at this time. If sx worsen or she uses NTG frequently, plan for cath. 2. Obesity: Still struggling to lose weight.  She says she is now going to focus on herself and her health. 3. HTN: Mildly elevated.  SHe is going to increase exercise.  Continue current meds.  4. Hyperlipidemia: Restart Crestor.  She  had not been taking this properly.     Current medicines are reviewed at length with the patient today.  The patient concerns regarding her medicines were addressed.  The following changes have been made:  No change  Labs/ tests ordered today include:  No orders of the defined types were placed in this encounter.    Recommend 150 minutes/week of aerobic exercise Low fat, low carb, high fiber diet recommended  Disposition:   FU in 1 year  Signed, Larae Grooms, MD  08/27/2015 1:37 PM    [facility] [place], [place], [place]  [address] Phone: [phone]; Fax: [phone]

## 2015-08-27 NOTE — Patient Instructions (Signed)

## 2016-05-12 ENCOUNTER — Telehealth: Payer: Self-pay

## 2016-05-12 NOTE — Telephone Encounter (Signed)
A detailed message was left on patient's voicemail (DPR on file) that Dr. Irish Lack has filled out the form for her disability parking placard and it would be left up front for her to pick up. Patient instructed to call the office if she had any questions.

## 2016-07-15 ENCOUNTER — Other Ambulatory Visit: Payer: Self-pay | Admitting: Family Medicine

## 2016-07-15 ENCOUNTER — Ambulatory Visit
Admission: RE | Admit: 2016-07-15 | Discharge: 2016-07-15 | Disposition: A | Discharge status: Home / Self Care | Payer: 59 | Source: Ambulatory Visit | Attending: Family Medicine | Admitting: Family Medicine

## 2016-07-15 DIAGNOSIS — M542 Cervicalgia: Secondary | ICD-10-CM

## 2016-07-15 IMAGING — CR DG CERVICAL SPINE 2 OR 3 VIEWS
3 series · 3 of 3 positions shown · non-contrast
Comparison: [DATE] by report only

CLINICAL DATA: Neck and right arm pain 2 weeks, no injury

EXAM:
CERVICAL SPINE - 2-3 VIEW

[w c-spine lat]
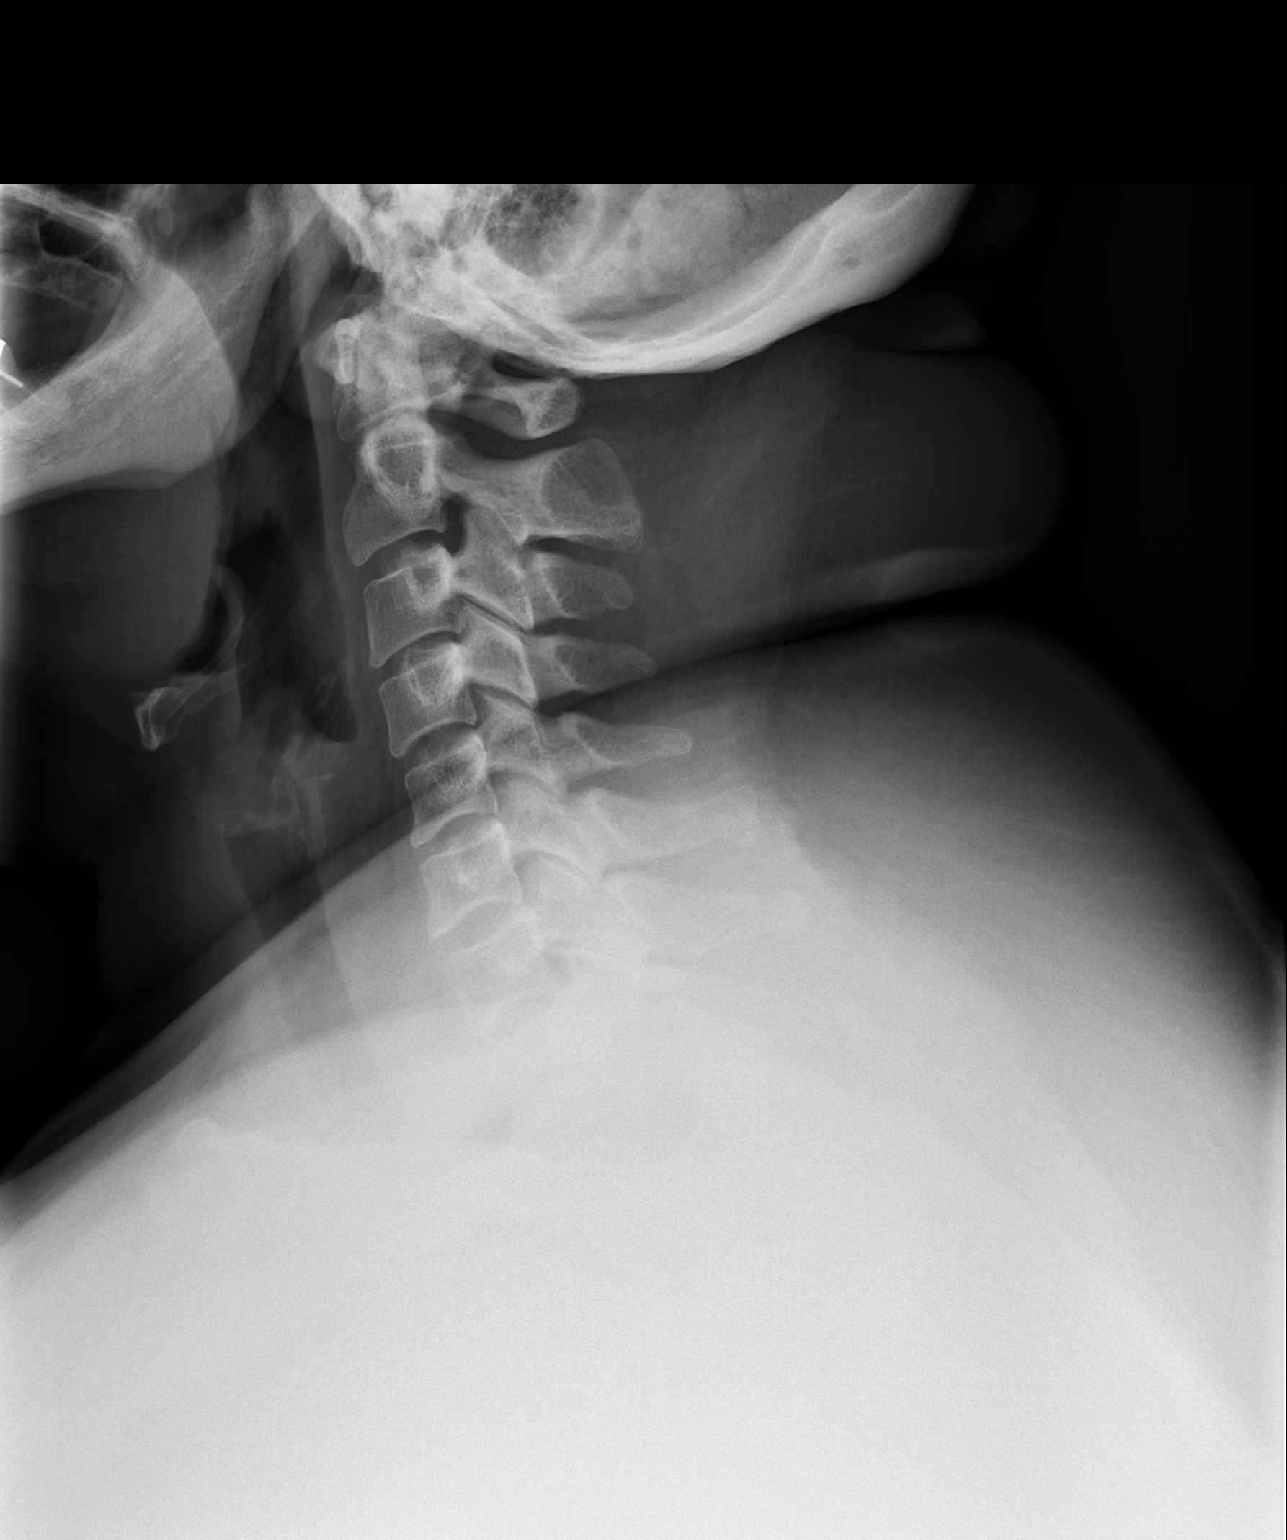

[w c-spine a.p. *]
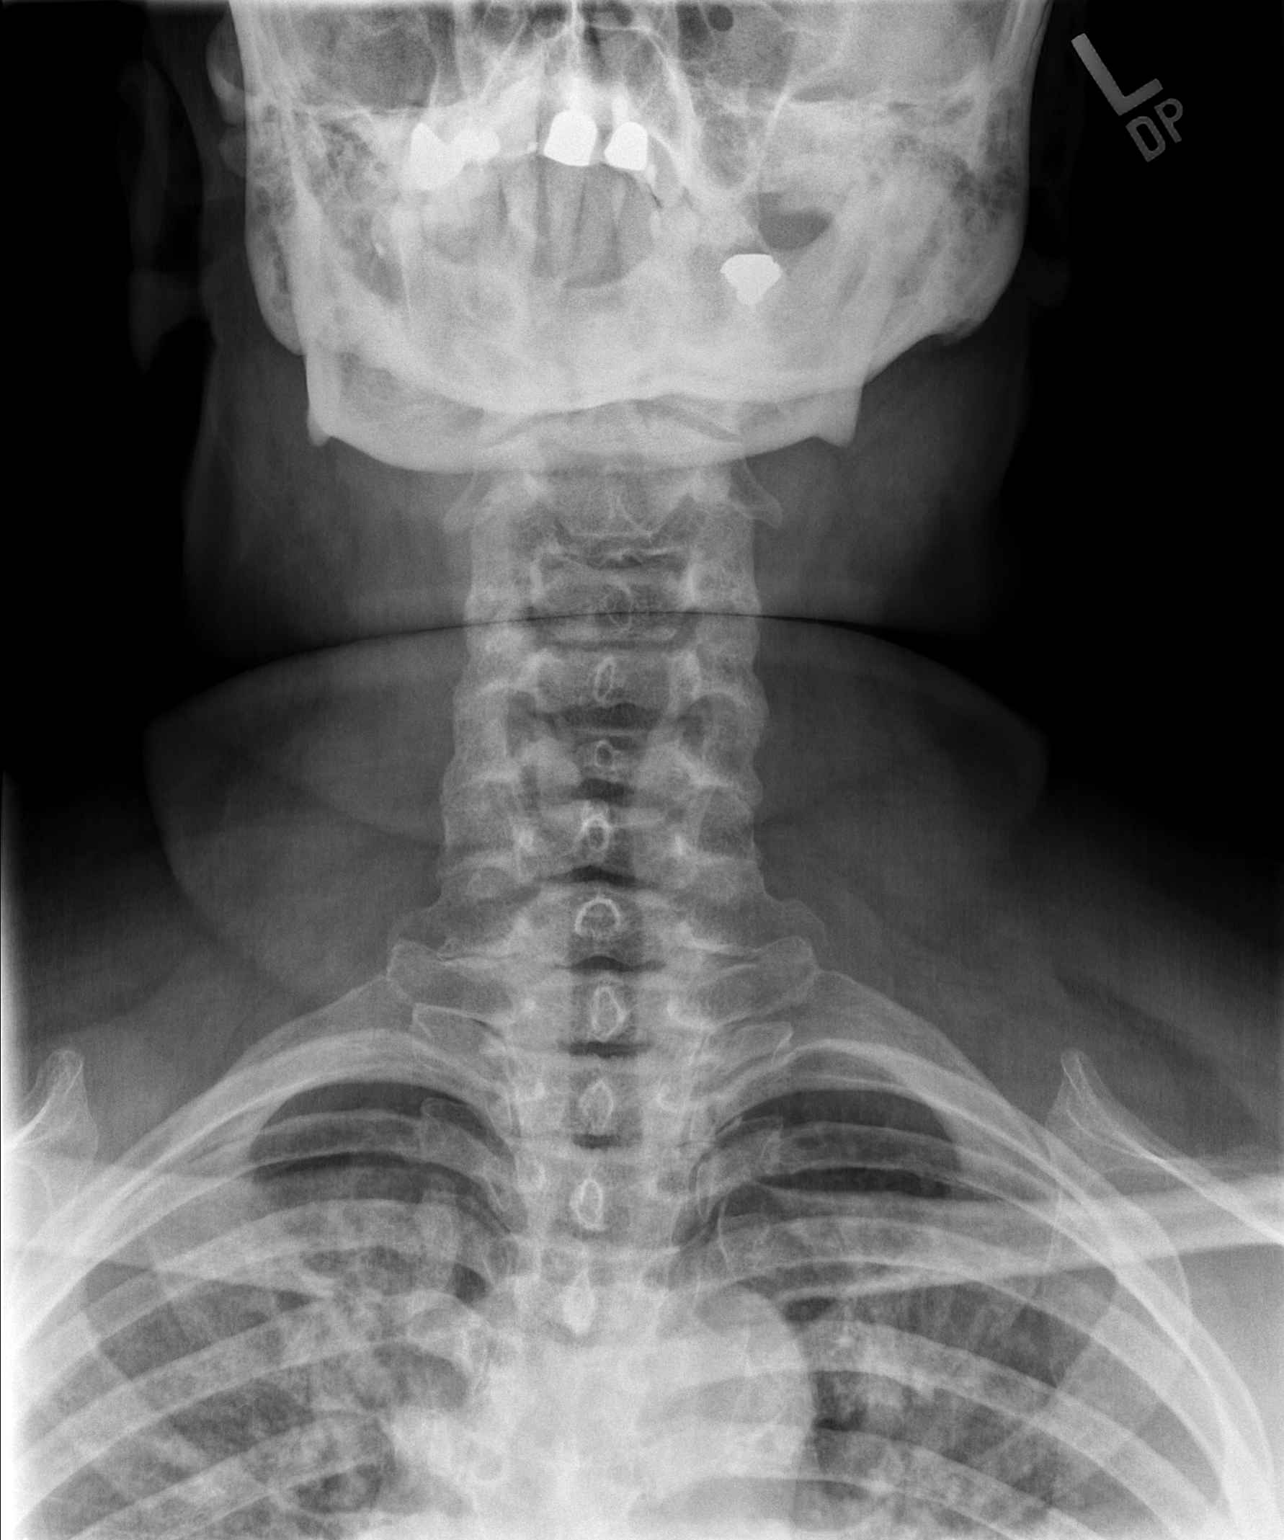

[w c-spine odontoid *]
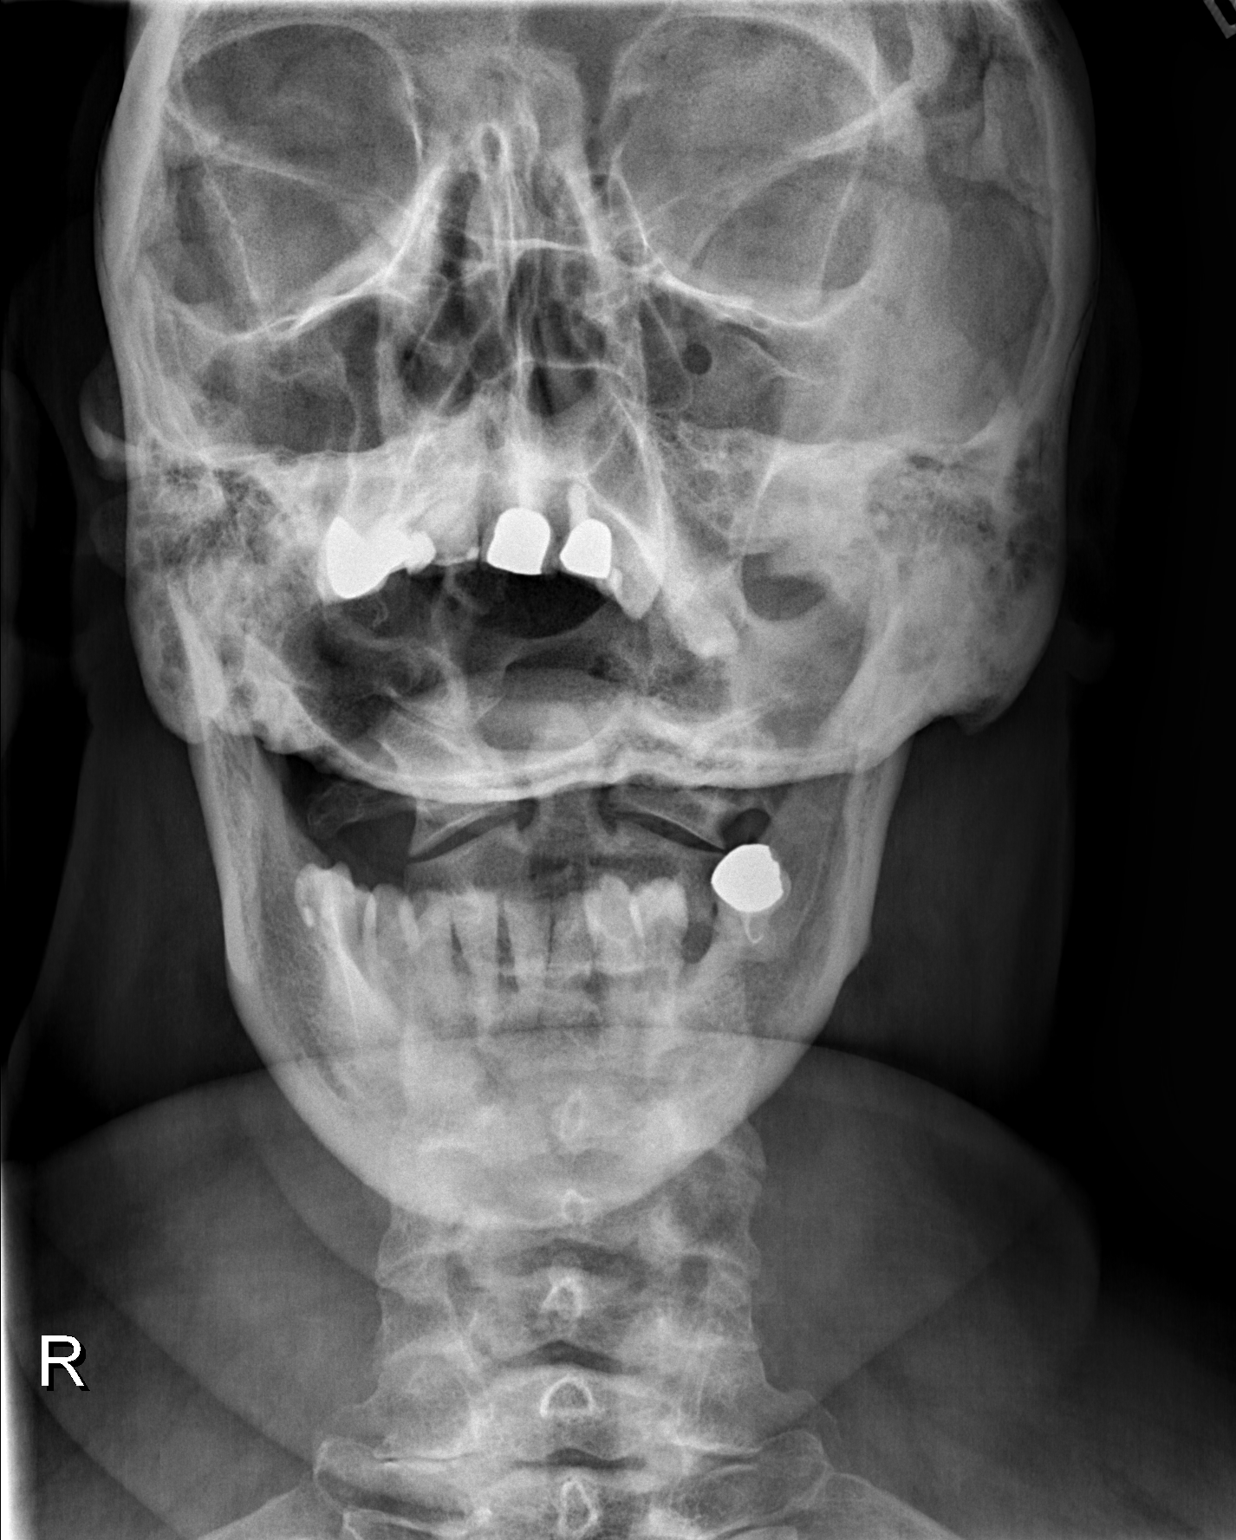

[3 of 3 positions shown; findings below may reference images not displayed]

FINDINGS: There is no evidence of cervical spine fracture or prevertebral soft
tissue swelling. Alignment is normal. No other significant bone
abnormalities are identified. Multiple dental restorations.
IMPRESSION: Negative cervical spine radiographs.

## 2016-07-16 ENCOUNTER — Ambulatory Visit (INDEPENDENT_AMBULATORY_CARE_PROVIDER_SITE_OTHER): Payer: 59 | Admitting: Cardiology

## 2016-07-16 ENCOUNTER — Encounter: Payer: Self-pay | Admitting: Cardiology

## 2016-07-16 VITALS — BP 150/88 | HR 94 | Ht 62.0 in | Wt 309.0 lb

## 2016-07-16 DIAGNOSIS — I2510 Atherosclerotic heart disease of native coronary artery without angina pectoris: Secondary | ICD-10-CM | POA: Diagnosis not present

## 2016-07-16 DIAGNOSIS — Z8739 Personal history of other diseases of the musculoskeletal system and connective tissue: Secondary | ICD-10-CM | POA: Diagnosis not present

## 2016-07-16 DIAGNOSIS — I10 Essential (primary) hypertension: Secondary | ICD-10-CM

## 2016-07-16 DIAGNOSIS — E782 Mixed hyperlipidemia: Secondary | ICD-10-CM | POA: Diagnosis not present

## 2016-07-16 MED ORDER — ASPIRIN EC 81 MG PO TBEC: 81.0000 mg | DELAYED_RELEASE_TABLET | Freq: Every day | ORAL | 3 refills | Status: DC

## 2016-07-16 MED ORDER — AMLODIPINE BESYLATE 10 MG PO TABS: 10.0000 mg | ORAL_TABLET | Freq: Every day | ORAL | 3 refills | Status: DC

## 2016-07-16 MED ORDER — LISINOPRIL 40 MG PO TABS: 40.0000 mg | ORAL_TABLET | Freq: Every day | ORAL | 3 refills | Status: DC

## 2016-07-16 MED ORDER — FUROSEMIDE 40 MG PO TABS: 80.0000 mg | ORAL_TABLET | Freq: Every day | ORAL | 3 refills | Status: DC

## 2016-07-16 MED ORDER — POTASSIUM CHLORIDE CRYS ER 10 MEQ PO TBCR: 10.0000 meq | EXTENDED_RELEASE_TABLET | Freq: Two times a day (BID) | ORAL | 3 refills | Status: DC

## 2016-07-16 MED ORDER — CARVEDILOL 6.25 MG PO TABS: 6.2500 mg | ORAL_TABLET | Freq: Two times a day (BID) | ORAL | 3 refills | Status: DC

## 2016-07-16 MED ORDER — ROSUVASTATIN CALCIUM 20 MG PO TABS: 20.0000 mg | ORAL_TABLET | Freq: Every day | ORAL | 3 refills | Status: DC

## 2016-07-16 MED ORDER — CLOPIDOGREL BISULFATE 75 MG PO TABS: 75.0000 mg | ORAL_TABLET | Freq: Every day | ORAL | 3 refills | Status: DC

## 2016-07-16 MED ORDER — NITROGLYCERIN 0.4 MG SL SUBL: 0.4000 mg | SUBLINGUAL_TABLET | SUBLINGUAL | 3 refills | Status: DC | PRN

## 2016-07-16 NOTE — Progress Notes (Signed)
07/16/2016 Melvern Banker Platter   [birth year]  [id number]  Primary Physician Carol Ada, MD Primary Cardiologist: Dr. Irish Lack    Reason for Visit/CC: Burning in Legs   HPI:  [name] is a 61 y.o. female, followed by Dr. Okey Regal.  She has a h/o CAD and has undergone stenting of her LAD in the past. Her last LHC in 2012 showed patent stent. She also has a h/o obesity, HTN and HLD. Per chart review, she has prior abnormality on nuclear stress test, however in the absence of symptoms, repeat cath was not pursued. It is outlined in Dr. Hassell Done note to consider cath if development of symptoms worrisome for angina.   She presents to clinic today with a complaint of burning in her legs. Occurs at rest. Not any worse with ambulation. She has poorly controlled DM. Her last Hgb A1c was 9.4. She is followed by an endocrinologist. Her pain is intermittent. She is currently asymptomatic. No burning leg pain at present. No significant edema. She denies loss of sensation in her feet. She preforms frequent foot checks at home.   She also reports she has a burning in her left upper chest near her neck with tingling in her upper arms. She reports she was seen by a provider the other day and was ordered to get an Xray of her neck>>sounds like possible cervical radiculopathy. She has not received theses results yet.  She occasionally gets mild chest pressure with exertion and gets short of breath, however these symptoms resolve with rest. No significant resting CP. This may be due to her obesity/ poor exercise tolerance.   EKG today shows NSR. No ischemic abnormalities. Her BP and HR are both elevated. BP is 150/88. Pulse rate is 94 bpm.   Current Meds  Medication Sig  . amLODipine (NORVASC) 10 MG tablet Take 1 tablet (10 mg total) by mouth daily.  . clopidogrel (PLAVIX) 75 MG tablet Take 1 tablet (75 mg total) by mouth daily.  Marland Kitchen dexlansoprazole (DEXILANT) 60 MG capsule Take 1 capsule (60 mg total) by  mouth daily.  . Fluticasone-Salmeterol (ADVAIR) 100-50 MCG/DOSE AEPB Inhale 1 puff into the lungs 2 (two) times daily.  Marland Kitchen FREESTYLE LITE test strip 1 each by Other route as directed.   . furosemide (LASIX) 40 MG tablet Take 2 tablets (80 mg total) by mouth daily.  Marland Kitchen HUMULIN R 500 UNIT/ML SOLN injection Inject 20 Units into the skin 2 (two) times daily with a meal.   . lisinopril (PRINIVIL,ZESTRIL) 40 MG tablet Take 1 tablet (40 mg total) by mouth daily.  . naproxen (NAPROSYN) 500 MG tablet Take 1 tablet by mouth 2 (two) times daily as needed for mild pain or moderate pain.   . nitroGLYCERIN (NITROSTAT) 0.4 MG SL tablet Place 1 tablet (0.4 mg total) under the tongue every 5 (five) minutes as needed for chest pain (X 3 DOSES FOR CHEST PAIN).  Marland Kitchen potassium chloride (KLOR-CON M10) 10 MEQ tablet Take 1 tablet (10 mEq total) by mouth 2 (two) times daily.  . rosuvastatin (CRESTOR) 20 MG tablet Take 1 tablet (20 mg total) by mouth daily.  . TRULICITY 1.5 GX/2.1JH SOPN Inject 1.5 mg as directed once a week. Reported on 08/27/2015  . [DISCONTINUED] amLODipine (NORVASC) 10 MG tablet Take 1 tablet (10 mg total) by mouth daily.  . [DISCONTINUED] aspirin 325 MG tablet Take 325 mg by mouth daily.  . [DISCONTINUED] carvedilol (COREG) 3.125 MG tablet Take 1 tablet (3.125 mg total) by mouth  2 (two) times daily.  . [DISCONTINUED] clopidogrel (PLAVIX) 75 MG tablet Take 1 tablet (75 mg total) by mouth daily.  . [DISCONTINUED] furosemide (LASIX) 40 MG tablet Take 2 tablets (80 mg total) by mouth daily.  . [DISCONTINUED] lisinopril (PRINIVIL,ZESTRIL) 40 MG tablet Take 1 tablet (40 mg total) by mouth daily.  . [DISCONTINUED] nitroGLYCERIN (NITROSTAT) 0.4 MG SL tablet Place 1 tablet (0.4 mg total) under the tongue every 5 (five) minutes as needed for chest pain (X 3 DOSES FOR CHEST PAIN).  . [DISCONTINUED] potassium chloride (KLOR-CON M10) 10 MEQ tablet Take 1 tablet (10 mEq total) by mouth 2 (two) times daily.  .  [DISCONTINUED] rosuvastatin (CRESTOR) 20 MG tablet Take 1 tablet (20 mg total) by mouth daily.   Allergies  Allergen Reactions  . Bystolic [Nebivolol Hcl] Palpitations  . Crestor [Rosuvastatin] Other (See Comments)    Couldn't tolerate Crestor 40 mg dose   . Sulfa Antibiotics Anaphylaxis  . Metformin And Related Diarrhea   Past Medical History:  Diagnosis Date  . Arthritis   . Asthma   . CHF (congestive heart failure) ([place])   . Diabetes mellitus   . GERD (gastroesophageal reflux disease)   . Hx of cardiovascular stress test    Lexiscan Myoview (10/15):  Medium area of ischemia in AL and IL distribution, cannot completely exclude shifting breast attenuation, EF 64%; Abnormal study  . Hyperlipidemia   . Hypertension   . Morbid obesity ([place])    Family History  Problem Relation Age of Onset  . Kidney disease Father   . Stroke Father   . Stroke Maternal Grandfather   . Stroke Paternal Grandfather   . Heart attack Father    Past Surgical History:  Procedure Laterality Date  . [place]   right  . HAND SURGERY     right  . heart stent  2007  . UTERINE FIBROID SURGERY  2008   Social History   Social History  . Marital status: Single    Spouse name: N/A  . Number of children: N/A  . Years of education: N/A   Occupational History  . Not on file.   Social History Main Topics  . Smoking status: Former Smoker    Quit date: 08/07/1982  . Smokeless tobacco: Never Used  . Alcohol use No  . Drug use: No  . Sexual activity: Not on file   Other Topics Concern  . Not on file   Social History Narrative  . No narrative on file     Review of Systems: General: negative for chills, fever, night sweats or weight changes.  Cardiovascular: negative for chest pain, dyspnea on exertion, edema, orthopnea, palpitations, paroxysmal nocturnal dyspnea or shortness of breath Dermatological: negative for rash Respiratory: negative for cough or wheezing Urologic:  negative for hematuria Abdominal: negative for nausea, vomiting, diarrhea, bright red blood per rectum, melena, or hematemesis Neurologic: negative for visual changes, syncope, or dizziness All other systems reviewed and are otherwise negative except as noted above.   Physical Exam:  Blood pressure (!) 150/88, pulse 94, height 5\' 2"  (1.575 m), weight (!) 309 lb (140.2 kg).  General appearance: alert, cooperative, no distress and morbidly obese Neck: no carotid bruit and no JVD Lungs: clear to auscultation bilaterally Heart: regular rate and rhythm, S1, S2 normal, no murmur, click, rub or gallop Extremities: extremities normal, atraumatic, no cyanosis or edema Pulses: 2+ and symmetric Skin: Skin color, texture, turgor normal. No rashes or lesions Neurologic: Grossly  normal  EKG NSR. No ischemic abnormalities -- personally reviewed   ASSESSMENT AND PLAN:   1. CAD: h/o LAD stent in the past. [facility] in 2012 showed patent stent. Per chart review, she has prior abnormality on nuclear stress test, however in the absence of symptoms, repeat cath was not pursued at that time. It is outlined in Dr. Hassell Done note to consider cath if development of symptoms worrisome for angina. She occasionally gets mild chest pressure with exertion and gets short of breath, however these symptoms resolve with rest. No significant resting CP. This may be due to her obesity/ poor exercise tolerance. EKG today shows NSR. No ischemic abnormalities. Her BP and HR are both elevated. BP is 150/88. Pulse rate is 94 bpm. She is currently CP free. I will increase her Coreg to 6.25 mg BID. She will keep her f/u with Dr. Christ Kick next month to assess response to med change and reassess symptoms. He will determine if cath is warranted.   3. Burning in Legs: no pulse deficits noted in LEs on exam. She has poorly controlled DM with last Hgb A1c at 9.4. I think this may be due to diabetic nerve pain. Pt encouraged to f/u with PCP and  endocrinologist.   3. Upper Extremity Tingling: I think this may also be 2/2 radiculopathy. She had an Xray of her neck the other day and is awaiting results.   4. Obesity: has considered bariatric surgery but she reports this is on hold. 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   5. HTN: elevated. Increase Coreg to 6.25 mg. She has f/u with Dr. Irish Lack next month. BP will be reassessed.   6. DM: managed by endocrinologist.   Lyda Jester PA-C, [facility] 07/16/2016 1:58 PM

## 2016-07-16 NOTE — Patient Instructions (Addendum)
Medication Instructions:  Your physician has recommended you make the following change in your medication:  1.  INCREASE the Coreg to 6.25 taking 1 tablet twice a day 2.  DECREASE the Aspirin to 81 mg daily  Labwork: 10/02/16:  LIPID  Testing/Procedures: None ordered  Follow-Up: Your physician recommends that you schedule a follow-up appointment in: 10/02/16 WITH DR. VARANASI, MAKE SURE YOU COME FASTING SO THEY CAN DO SOME LAB WORK   Any Other Special Instructions Will Be Listed Below (If Applicable).     If you need a refill on your cardiac medications before your next appointment, please call your pharmacy.

## 2016-10-01 NOTE — Progress Notes (Signed)
Cardiology Office Note   Date:  10/02/2016   ID:  [name], DOB [birth year], MRN [id number]  PCP:  Carol Ada, MD    Chief Complaint  Patient presents with  . Follow-up  CAD   Wt Readings from Last 3 Encounters:  10/02/16 (!) 318 lb 3.2 oz (144.3 kg)  07/16/16 (!) 309 lb (140.2 kg)  08/27/15 (!) 315 lb (142.9 kg)       History of Present Illness: [name] is a 61 y.o. female  who has had issues with obesity and CAD. Most recent cath showed patent LAD stent in May 2012.   She was approved for lap band surgery. She has seen Dr. Hassell Done. There were issues with her insurance covering the surgery but she thinks these will be resolved. Surgery is off for the time being because eof her mother's illness.  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  She was seen in June 2018 due to burning in her legs.  Coreg was increased.  She continues to care for her mother.  Weight loss surgery is on hold at this time.  The mother does not do well with other people taking care of her.  Denies : Chest pain like her angina. Dizziness. Leg edema. Nitroglycerin use. Orthopnea. Palpitations. Paroxysmal nocturnal dyspnea. Shortness of breath. Syncope.   She did not tolerate CPAP in the past or OSA.  She continues to have some fatigue.  No regular exercise except the waking she does at work.      Past Medical History:  Diagnosis Date  . Arthritis   . Asthma   . CHF (congestive heart failure) ([place])   . Diabetes mellitus   . GERD (gastroesophageal reflux disease)   . Hx of cardiovascular stress test    Lexiscan Myoview (10/15):  Medium area of ischemia in AL and IL distribution, cannot completely exclude shifting breast attenuation, EF 64%; Abnormal study  . Hyperlipidemia   . Hypertension   . Morbid obesity ([place])     Past Surgical History:  Procedure Laterality Date  . [place]   right  . HAND SURGERY     right  . heart stent  2007    . UTERINE FIBROID SURGERY  2008     Current Outpatient Prescriptions  Medication Sig Dispense Refill  . albuterol (PROVENTIL HFA;VENTOLIN HFA) 108 (90 Base) MCG/ACT inhaler Inhale 1-2 puffs into the lungs every 4 (four) hours as needed for wheezing or shortness of breath.    Marland Kitchen amLODipine (NORVASC) 10 MG tablet Take 1 tablet (10 mg total) by mouth daily. 90 tablet 3  . aspirin EC 81 MG tablet Take 1 tablet (81 mg total) by mouth daily. 90 tablet 3  . BREO ELLIPTA 100-25 MCG/INH AEPB Inhale 1 puff into the lungs daily.    . carvedilol (COREG) 6.25 MG tablet Take 1 tablet (6.25 mg total) by mouth 2 (two) times daily. 180 tablet 3  . clopidogrel (PLAVIX) 75 MG tablet Take 1 tablet (75 mg total) by mouth daily. 90 tablet 3  . dexlansoprazole (DEXILANT) 60 MG capsule Take 1 capsule (60 mg total) by mouth daily. 30 capsule 11  . FREESTYLE LITE test strip 1 each by Other route as directed.     . furosemide (LASIX) 40 MG tablet Take 2 tablets (80 mg total) by mouth daily. 180 tablet 3  . HUMULIN R 500 UNIT/ML SOLN injection Inject 80-110 Units into the skin  2 (two) times daily with a meal.     . lisinopril (PRINIVIL,ZESTRIL) 40 MG tablet Take 1 tablet (40 mg total) by mouth daily. 90 tablet 3  . naproxen (NAPROSYN) 500 MG tablet Take 1 tablet by mouth 2 (two) times daily as needed for mild pain or moderate pain.     . nitroGLYCERIN (NITROSTAT) 0.4 MG SL tablet Place 1 tablet (0.4 mg total) under the tongue every 5 (five) minutes as needed for chest pain (X 3 DOSES FOR CHEST PAIN). 25 tablet 3  . potassium chloride (KLOR-CON M10) 10 MEQ tablet Take 1 tablet (10 mEq total) by mouth 2 (two) times daily. 180 tablet 3  . rosuvastatin (CRESTOR) 40 MG tablet Take 1 tablet by mouth daily.    . TRULICITY 1.5 YP/9.5KD SOPN Inject 1.5 mg as directed once a week. Reported on 08/27/2015     No current facility-administered medications for this visit.     Allergies:   Bystolic [nebivolol hcl]; Crestor  [rosuvastatin]; Sulfa antibiotics; and Metformin and related    Social History:  The patient  reports that she quit smoking about 34 years ago. She has never used smokeless tobacco. She reports that she does not drink alcohol or use drugs.   Family History:  The patient's family history includes Heart attack in her father; Kidney disease in her father; Stroke in her father, maternal grandfather, and paternal grandfather.    ROS:  Please see the history of present illness.   Otherwise, review of systems are positive for weight gain.   All other systems are reviewed and negative.    PHYSICAL EXAM: VS:  BP (!) 150/60   Pulse 79   Ht 5' 2.5" (1.588 m)   Wt (!) 318 lb 3.2 oz (144.3 kg)   LMP  (LMP Unknown)   BMI 57.27 kg/m  , BMI Body mass index is 57.27 kg/m. GEN: Well nourished, well developed, in no acute distress  HEENT: normal  Neck: no JVD, carotid bruits, or masses Cardiac: RRR; no murmurs, rubs, or gallops,; bilateral leg edema  Respiratory:  clear to auscultation bilaterally, normal work of breathing GI: soft, nontender, nondistended, + BS, obese MS: no deformity or atrophy  Skin: warm and dry, no rash Neuro:  Strength and sensation are intact Psych: euthymic mood, full affect   EKG:   The ekg ordered in June 2018 demonstrates NSR, normal ECG in    Recent Labs: No results found for requested labs within last 8760 hours.   Lipid Panel    Component Value Date/Time   CHOL 184 01/24/2014 1029   TRIG 107.0 01/24/2014 1029   HDL 32.10 (L) 01/24/2014 1029   CHOLHDL 6 01/24/2014 1029   VLDL 21.4 01/24/2014 1029   LDLCALC 131 (H) 01/24/2014 1029     Other studies Reviewed: Additional studies/ records that were reviewed today with results demonstrating: 2018 lipids reviewed.   ASSESSMENT AND PLAN:  1. CAD: Prior LAD stent.  Patent on 2012  Cath.  No angina at this time on medical therapy.  SHe has had atypical pains in the past.  Still at high risk for coronary  events.  CHanging RF is important for her Long-term risk reduction. 2. Obesity: We spoke at length about the benefits of weight loss surgery.  SHe needs to get things lined up to take care of her mother.  She does not have many willing family members who will help care for her mother. 3. HTN: Borderline control.  Worse with the  recent gain.  Hopefully, with mild weight loss, her blood pressures will return to where they were before the most recent weight gain. Continue amlodipine and ACE inhibitor. 4. Hyperlipidemia: LDL was 138 in July.  Crestor was increased.  LDL target is less than 100.  Physical in December.  Blood will be rechecked at that time.   5. DM: THis has been poorly controlled.  Last A1C was 11.   Current medicines are reviewed at length with the patient today.  The patient concerns regarding her medicines were addressed.  The following changes have been made:  No change  Labs/ tests ordered today include:  No orders of the defined types were placed in this encounter.   Recommend 150 minutes/week of aerobic exercise Low fat, low carb, high fiber diet recommended  Disposition:   FU in 9 months   Signed, Larae Grooms, MD  10/02/2016 9:44 AM    [facility] [address], [place], [place]  [address] Phone: [phone]; Fax: [phone]

## 2016-10-02 ENCOUNTER — Encounter: Payer: Self-pay | Admitting: Interventional Cardiology

## 2016-10-02 ENCOUNTER — Ambulatory Visit (INDEPENDENT_AMBULATORY_CARE_PROVIDER_SITE_OTHER): Payer: 59 | Admitting: Interventional Cardiology

## 2016-10-02 ENCOUNTER — Other Ambulatory Visit (INDEPENDENT_AMBULATORY_CARE_PROVIDER_SITE_OTHER): Payer: 59

## 2016-10-02 VITALS — BP 150/60 | HR 79 | Ht 62.5 in | Wt 318.2 lb

## 2016-10-02 DIAGNOSIS — E782 Mixed hyperlipidemia: Secondary | ICD-10-CM | POA: Diagnosis not present

## 2016-10-02 DIAGNOSIS — E669 Obesity, unspecified: Secondary | ICD-10-CM | POA: Diagnosis not present

## 2016-10-02 DIAGNOSIS — I25119 Atherosclerotic heart disease of native coronary artery with unspecified angina pectoris: Secondary | ICD-10-CM

## 2016-10-02 DIAGNOSIS — IMO0001 Reserved for inherently not codable concepts without codable children: Secondary | ICD-10-CM

## 2016-10-02 DIAGNOSIS — I10 Essential (primary) hypertension: Secondary | ICD-10-CM | POA: Diagnosis not present

## 2016-10-02 DIAGNOSIS — Z6841 Body Mass Index (BMI) 40.0 and over, adult: Secondary | ICD-10-CM

## 2016-10-02 LAB — LIPID PANEL
CHOL/HDL RATIO: 3.5 ratio (ref 0.0–4.4)
Cholesterol, Total: 134 mg/dL (ref 100–199)
HDL: 38 mg/dL — AB (ref >39)
LDL Calculated: 83 mg/dL (ref 0–99)
Triglycerides: 66 mg/dL (ref 0–149)
VLDL CHOLESTEROL CAL: 13 mg/dL (ref 5–40)

## 2016-10-02 NOTE — Patient Instructions (Signed)
Medication Instructions:  Your physician recommends that you continue on your current medications as directed. Please refer to the Current Medication list given to you today.   Labwork: None ordered  Testing/Procedures: None ordered  Follow-Up: Your physician wants you to follow-up in: 9 months with Dr. Varanasi. You will receive a reminder letter in the mail two months in advance. If you don't receive a letter, please call our office to schedule the follow-up appointment.   Any Other Special Instructions Will Be Listed Below (If Applicable).     If you need a refill on your cardiac medications before your next appointment, please call your pharmacy.   

## 2017-03-23 ENCOUNTER — Ambulatory Visit (HOSPITAL_COMMUNITY): Payer: Self-pay | Admitting: Ophthalmology

## 2017-03-23 ENCOUNTER — Encounter (HOSPITAL_COMMUNITY): Payer: Self-pay | Admitting: *Deleted

## 2017-03-23 ENCOUNTER — Other Ambulatory Visit: Payer: Self-pay

## 2017-03-23 ENCOUNTER — Other Ambulatory Visit: Payer: Self-pay | Admitting: Ophthalmology

## 2017-03-23 NOTE — Progress Notes (Signed)
Pt denies any acute cardiopulmonary issues. Pt under the care of Dr. Irish Lack, Cardiology. Pt denies having an echo. Pt denies having a chest x ray within the last year. Pt denies recent labs. Pt made aware to stop taking viamins, fish oil and herbal medications (CO Q 10). Do not take any NSAIDs ie: Ibuprofen, Advil, Naproxen ( Naprosyn/Aleve), Motrin, BC and Goody Powder. Pt made aware to take no Humulin insulin or Trulicity DOS. Pt made aware to check BG every 2 hours prior to arrival to hospital on DOS. Pt made aware to treat a BG < 70 with 4 glucose tabs or glucose gel or 4 ounces of apple or cranberry juice, wait 15 minutes after intervention to recheck BG, if BG remains < 70, call Short Stay unit to speak with a nurse. Pt verbalized understanding of all pre-op instructions. Dr.Ossey, Anesthesia reviewed pt last cardiology note (Dr. Irish Lack, 10/02/16) and advised that pt be evaluated upon arrival on DOS.

## 2017-03-24 ENCOUNTER — Encounter (HOSPITAL_COMMUNITY)
Admission: RE | Disposition: A | Discharge status: Home / Self Care | Payer: Self-pay | Source: Ambulatory Visit | Attending: Ophthalmology

## 2017-03-24 ENCOUNTER — Ambulatory Visit (HOSPITAL_COMMUNITY): Payer: 59 | Admitting: Certified Registered Nurse Anesthetist

## 2017-03-24 ENCOUNTER — Ambulatory Visit (HOSPITAL_COMMUNITY)
Admission: RE | Admit: 2017-03-24 | Discharge: 2017-03-24 | Disposition: A | Discharge status: Home / Self Care | Payer: 59 | Source: Ambulatory Visit | Attending: Ophthalmology | Admitting: Ophthalmology

## 2017-03-24 ENCOUNTER — Encounter (HOSPITAL_COMMUNITY): Payer: Self-pay | Admitting: Certified Registered Nurse Anesthetist

## 2017-03-24 DIAGNOSIS — Z79899 Other long term (current) drug therapy: Secondary | ICD-10-CM | POA: Insufficient documentation

## 2017-03-24 DIAGNOSIS — G4730 Sleep apnea, unspecified: Secondary | ICD-10-CM | POA: Insufficient documentation

## 2017-03-24 DIAGNOSIS — I110 Hypertensive heart disease with heart failure: Secondary | ICD-10-CM | POA: Diagnosis not present

## 2017-03-24 DIAGNOSIS — I2510 Atherosclerotic heart disease of native coronary artery without angina pectoris: Secondary | ICD-10-CM | POA: Insufficient documentation

## 2017-03-24 DIAGNOSIS — H35342 Macular cyst, hole, or pseudohole, left eye: Secondary | ICD-10-CM | POA: Diagnosis not present

## 2017-03-24 DIAGNOSIS — Z7984 Long term (current) use of oral hypoglycemic drugs: Secondary | ICD-10-CM | POA: Diagnosis not present

## 2017-03-24 DIAGNOSIS — I509 Heart failure, unspecified: Secondary | ICD-10-CM | POA: Insufficient documentation

## 2017-03-24 DIAGNOSIS — Z794 Long term (current) use of insulin: Secondary | ICD-10-CM | POA: Insufficient documentation

## 2017-03-24 DIAGNOSIS — E119 Type 2 diabetes mellitus without complications: Secondary | ICD-10-CM | POA: Diagnosis not present

## 2017-03-24 DIAGNOSIS — H33192 Other retinoschisis and retinal cysts, left eye: Secondary | ICD-10-CM | POA: Insufficient documentation

## 2017-03-24 DIAGNOSIS — K219 Gastro-esophageal reflux disease without esophagitis: Secondary | ICD-10-CM | POA: Diagnosis not present

## 2017-03-24 DIAGNOSIS — H43822 Vitreomacular adhesion, left eye: Secondary | ICD-10-CM | POA: Insufficient documentation

## 2017-03-24 HISTORY — DX: Type 2 diabetes mellitus with diabetic neuropathy, unspecified: E11.40

## 2017-03-24 HISTORY — PX: PARS PLANA VITRECTOMY: SHX2166

## 2017-03-24 HISTORY — DX: Atherosclerotic heart disease of native coronary artery without angina pectoris: I25.10

## 2017-03-24 HISTORY — DX: Personal history of other diseases of the digestive system: Z87.19

## 2017-03-24 HISTORY — DX: Sleep apnea, unspecified: G47.30

## 2017-03-24 LAB — BASIC METABOLIC PANEL
Anion gap: 11 (ref 5–15)
BUN: 7 mg/dL (ref 6–20)
CO2: 25 mmol/L (ref 22–32)
CREATININE: 0.71 mg/dL (ref 0.44–1.00)
Calcium: 9.3 mg/dL (ref 8.9–10.3)
Chloride: 100 mmol/L — ABNORMAL LOW (ref 101–111)
GFR calc non Af Amer: >60 mL/min (ref >60)
Glucose, Bld: 163 mg/dL — ABNORMAL HIGH (ref 65–99)
Potassium: 3.5 mmol/L (ref 3.5–5.1)
SODIUM: 136 mmol/L (ref 135–145)

## 2017-03-24 LAB — GLUCOSE, CAPILLARY
GLUCOSE-CAPILLARY: 177 mg/dL — AB (ref 65–99)
Glucose-Capillary: 165 mg/dL — ABNORMAL HIGH (ref 65–99)
Glucose-Capillary: 175 mg/dL — ABNORMAL HIGH (ref 65–99)

## 2017-03-24 LAB — CBC
HCT: 41.3 % (ref 36.0–46.0)
Hemoglobin: 14.5 g/dL (ref 12.0–15.0)
MCH: 27.7 pg (ref 26.0–34.0)
MCHC: 35.1 g/dL (ref 30.0–36.0)
MCV: 79 fL (ref 78.0–100.0)
PLATELETS: 275 10*3/uL (ref 150–400)
RBC: 5.23 MIL/uL — AB (ref 3.87–5.11)
RDW: 14.3 % (ref 11.5–15.5)
WBC: 8.4 10*3/uL (ref 4.0–10.5)

## 2017-03-24 SURGERY — PARS PLANA VITRECTOMY WITH 25 GAUGE
Anesthesia: Monitor Anesthesia Care | Laterality: Left

## 2017-03-24 SURGERY — PARS PLANA VITRECTOMY WITH 25 GAUGE
Anesthesia: Monitor Anesthesia Care | Site: Eye | Laterality: Left

## 2017-03-24 MED ORDER — ONDANSETRON HCL 4 MG/2ML IJ SOLN
INTRAMUSCULAR | Status: AC
  Filled 2017-03-24: qty 4

## 2017-03-24 MED ORDER — LIDOCAINE HCL 2 % IJ SOLN
INTRAMUSCULAR | Status: AC
  Filled 2017-03-24: qty 20

## 2017-03-24 MED ORDER — TETRACAINE HCL 0.5 % OP SOLN
OPHTHALMIC | Status: AC
  Filled 2017-03-24: qty 4

## 2017-03-24 MED ORDER — DEXAMETHASONE SODIUM PHOSPHATE 10 MG/ML IJ SOLN
INTRAMUSCULAR | Status: AC
  Filled 2017-03-24: qty 2

## 2017-03-24 MED ORDER — STERILE WATER FOR INJECTION IJ SOLN
INTRAMUSCULAR | Status: AC
  Filled 2017-03-24: qty 10

## 2017-03-24 MED ORDER — PHENYLEPHRINE 40 MCG/ML (10ML) SYRINGE FOR IV PUSH (FOR BLOOD PRESSURE SUPPORT)
PREFILLED_SYRINGE | INTRAVENOUS | Status: AC
  Filled 2017-03-24: qty 20

## 2017-03-24 MED ORDER — BSS IO SOLN
INTRAOCULAR | Status: DC | PRN
  Administered 2017-03-24: 15 mL

## 2017-03-24 MED ORDER — PHENYLEPHRINE HCL 2.5 % OP SOLN
1.0000 [drp] | OPHTHALMIC | Status: AC | PRN
  Administered 2017-03-24 (×3): 1 [drp] via OPHTHALMIC
  Filled 2017-03-24: qty 2

## 2017-03-24 MED ORDER — FENTANYL CITRATE (PF) 100 MCG/2ML IJ SOLN: 25.0000 ug | INTRAMUSCULAR | Status: DC | PRN

## 2017-03-24 MED ORDER — GENTAMICIN SULFATE 40 MG/ML IJ SOLN
INTRAMUSCULAR | Status: AC
  Filled 2017-03-24: qty 2

## 2017-03-24 MED ORDER — LIDOCAINE HCL 2 % IJ SOLN
INTRAMUSCULAR | Status: DC | PRN
  Administered 2017-03-24: 10 mL

## 2017-03-24 MED ORDER — ROCURONIUM BROMIDE 10 MG/ML (PF) SYRINGE
PREFILLED_SYRINGE | INTRAVENOUS | Status: AC
  Filled 2017-03-24: qty 10

## 2017-03-24 MED ORDER — HYPROMELLOSE (GONIOSCOPIC) 2.5 % OP SOLN
OPHTHALMIC | Status: AC
  Filled 2017-03-24: qty 15

## 2017-03-24 MED ORDER — DEXAMETHASONE SODIUM PHOSPHATE 10 MG/ML IJ SOLN
INTRAMUSCULAR | Status: AC
Start: 2017-03-24 — End: 2017-03-24
  Filled 2017-03-24: qty 1

## 2017-03-24 MED ORDER — EPINEPHRINE PF 1 MG/ML IJ SOLN
INTRAMUSCULAR | Status: AC
  Filled 2017-03-24: qty 1

## 2017-03-24 MED ORDER — LIDOCAINE 2% (20 MG/ML) 5 ML SYRINGE
INTRAMUSCULAR | Status: AC
  Filled 2017-03-24: qty 5

## 2017-03-24 MED ORDER — BSS IO SOLN
INTRAOCULAR | Status: AC
Start: 2017-03-24 — End: 2017-03-24
  Filled 2017-03-24: qty 30

## 2017-03-24 MED ORDER — BACITRACIN-POLYMYXIN B 500-10000 UNIT/GM OP OINT
TOPICAL_OINTMENT | OPHTHALMIC | Status: AC
  Filled 2017-03-24: qty 3.5

## 2017-03-24 MED ORDER — SCOPOLAMINE 1 MG/3DAYS TD PT72
MEDICATED_PATCH | TRANSDERMAL | Status: AC
  Filled 2017-03-24: qty 1

## 2017-03-24 MED ORDER — STERILE WATER FOR IRRIGATION IR SOLN
Status: DC | PRN
  Administered 2017-03-24: 1000 mL

## 2017-03-24 MED ORDER — EPHEDRINE 5 MG/ML INJ
INTRAVENOUS | Status: AC
  Filled 2017-03-24: qty 10

## 2017-03-24 MED ORDER — BSS PLUS IO SOLN
INTRAOCULAR | Status: AC
  Filled 2017-03-24: qty 500

## 2017-03-24 MED ORDER — HYPROMELLOSE (GONIOSCOPIC) 2.5 % OP SOLN
OPHTHALMIC | Status: DC | PRN
  Administered 2017-03-24: 10 [drp] via OPHTHALMIC

## 2017-03-24 MED ORDER — NEOSTIGMINE METHYLSULFATE 5 MG/5ML IV SOSY
PREFILLED_SYRINGE | INTRAVENOUS | Status: AC
  Filled 2017-03-24: qty 5

## 2017-03-24 MED ORDER — SCOPOLAMINE 1 MG/3DAYS TD PT72
1.0000 | MEDICATED_PATCH | TRANSDERMAL | Status: DC
  Administered 2017-03-24: 1.5 mg via TRANSDERMAL

## 2017-03-24 MED ORDER — SODIUM CHLORIDE 0.9 % IV SOLN
INTRAVENOUS | Status: DC
  Administered 2017-03-24: 14:00:00 via INTRAVENOUS

## 2017-03-24 MED ORDER — PROPOFOL 10 MG/ML IV BOLUS
INTRAVENOUS | Status: DC | PRN
  Administered 2017-03-24: 40 mg via INTRAVENOUS

## 2017-03-24 MED ORDER — CYCLOPENTOLATE HCL 1 % OP SOLN
1.0000 [drp] | OPHTHALMIC | Status: AC
  Administered 2017-03-24 (×3): 1 [drp] via OPHTHALMIC
  Filled 2017-03-24: qty 2

## 2017-03-24 MED ORDER — GATIFLOXACIN 0.5 % OP SOLN
1.0000 [drp] | OPHTHALMIC | Status: AC | PRN
  Administered 2017-03-24 (×3): 1 [drp] via OPHTHALMIC
  Filled 2017-03-24: qty 2.5

## 2017-03-24 MED ORDER — PROPOFOL 10 MG/ML IV BOLUS
INTRAVENOUS | Status: AC
  Filled 2017-03-24: qty 20

## 2017-03-24 MED ORDER — ONDANSETRON HCL 4 MG/2ML IJ SOLN
INTRAMUSCULAR | Status: DC | PRN
  Administered 2017-03-24: 4 mg via INTRAVENOUS

## 2017-03-24 MED ORDER — EPINEPHRINE PF 1 MG/ML IJ SOLN
INTRAOCULAR | Status: DC | PRN
  Administered 2017-03-24: 17:00:00

## 2017-03-24 MED ORDER — POLYMYXIN B SULFATE 500000 UNITS IJ SOLR
INTRAMUSCULAR | Status: AC
  Filled 2017-03-24: qty 500000

## 2017-03-24 MED ORDER — INDOCYANINE GREEN 25 MG IV SOLR
INTRAVENOUS | Status: AC
Start: 2017-03-24 — End: 2017-03-24
  Filled 2017-03-24: qty 25

## 2017-03-24 MED ORDER — ONDANSETRON HCL 4 MG/2ML IJ SOLN
INTRAMUSCULAR | Status: AC
  Filled 2017-03-24: qty 2

## 2017-03-24 MED ORDER — DEXAMETHASONE SODIUM PHOSPHATE 10 MG/ML IJ SOLN
INTRAMUSCULAR | Status: DC | PRN
  Administered 2017-03-24: 10 mg

## 2017-03-24 MED ORDER — TOBRAMYCIN-DEXAMETHASONE 0.3-0.1 % OP OINT
TOPICAL_OINTMENT | OPHTHALMIC | Status: AC
  Filled 2017-03-24: qty 3.5

## 2017-03-24 SURGICAL SUPPLY — 53 items
APPLICATOR COTTON TIP 6IN STRL (MISCELLANEOUS) ×2 IMPLANT
APPLICATOR DR MATTHEWS STRL (MISCELLANEOUS) ×2 IMPLANT
BLADE MVR KNIFE 20G (BLADE) IMPLANT
CANNULA ANT CHAM MAIN (OPHTHALMIC RELATED) IMPLANT
CANNULA VLV SOFT TIP 25GA (OPHTHALMIC) ×2 IMPLANT
CORDS BIPOLAR (ELECTRODE) IMPLANT
COVER MAYO STAND STRL (DRAPES) IMPLANT
DRAPE INCISE 51X51 W/FILM STRL (DRAPES) IMPLANT
DRAPE OPHTHALMIC 77X100 STRL (CUSTOM PROCEDURE TRAY) ×2 IMPLANT
FILTER BLUE MILLIPORE (MISCELLANEOUS) IMPLANT
FORCEPS ECKARDT ILM 25G SERR (OPHTHALMIC RELATED) IMPLANT
FORCEPS GRIESHABER ILM 25G A (INSTRUMENTS) IMPLANT
FORCEPS HORIZONTAL 25G DISP (OPHTHALMIC RELATED) IMPLANT
GAS AUTO FILL CONSTEL (OPHTHALMIC)
GAS AUTO FILL CONSTELLATION (OPHTHALMIC) IMPLANT
GLOVE SS BIOGEL STRL SZ 8.5 (GLOVE) ×1 IMPLANT
GLOVE SUPERSENSE BIOGEL SZ 8.5 (GLOVE) ×1
GOWN STRL REUS W/ TWL LRG LVL3 (GOWN DISPOSABLE) ×1 IMPLANT
GOWN STRL REUS W/ TWL XL LVL3 (GOWN DISPOSABLE) ×1 IMPLANT
GOWN STRL REUS W/TWL LRG LVL3 (GOWN DISPOSABLE) ×1
GOWN STRL REUS W/TWL XL LVL3 (GOWN DISPOSABLE) ×1
KIT BASIN OR (CUSTOM PROCEDURE TRAY) ×2 IMPLANT
KNIFE CRESCENT 2.5 55 ANG (BLADE) IMPLANT
LENS BIOM SUPER VIEW SET DISP (OPHTHALMIC RELATED) ×2 IMPLANT
MICROPICK 25G (MISCELLANEOUS)
NEEDLE 18GX1X1/2 (RX/OR ONLY) (NEEDLE) IMPLANT
NEEDLE 25GX 5/8IN NON SAFETY (NEEDLE) IMPLANT
NEEDLE FILTER BLUNT 18X 1/2SAF (NEEDLE)
NEEDLE FILTER BLUNT 18X1 1/2 (NEEDLE) IMPLANT
NEEDLE HYPO 25GX1X1/2 BEV (NEEDLE) IMPLANT
NS IRRIG 1000ML POUR BTL (IV SOLUTION) ×2 IMPLANT
PACK FRAGMATOME (OPHTHALMIC) IMPLANT
PACK VITRECTOMY CUSTOM (CUSTOM PROCEDURE TRAY) ×2 IMPLANT
PAD ARMBOARD 7.5X6 YLW CONV (MISCELLANEOUS) ×2 IMPLANT
PAK PIK VITRECTOMY CVS 25GA (OPHTHALMIC) ×2 IMPLANT
PENCIL BIPOLAR 25GA STR DISP (OPHTHALMIC RELATED) IMPLANT
PICK MICROPICK 25G (MISCELLANEOUS) IMPLANT
PROBE LASER ILLUM FLEX CVD 25G (OPHTHALMIC) IMPLANT
ROLLS DENTAL (MISCELLANEOUS) IMPLANT
SCRAPER DIAMOND 25GA (OPHTHALMIC RELATED) IMPLANT
SET INJECTOR OIL FLUID CONSTEL (OPHTHALMIC) IMPLANT
STOCKINETTE IMPERVIOUS 9X36 MD (GAUZE/BANDAGES/DRESSINGS) ×4 IMPLANT
STOPCOCK 4 WAY LG BORE MALE ST (IV SETS) IMPLANT
SUT ETHILON 10 0 CS140 6 (SUTURE) IMPLANT
SUT ETHILON 8 0 BV130 4 (SUTURE) IMPLANT
SUT MERSILENE 5 0 RD 1 DA (SUTURE) IMPLANT
SUT PROLENE 10 0 CIF 4 DA (SUTURE) IMPLANT
SUT VICRYL 7 0 TG140 8 (SUTURE) IMPLANT
SYR 30ML SLIP (SYRINGE) IMPLANT
SYR 5ML LL (SYRINGE) IMPLANT
SYR TB 1ML LUER SLIP (SYRINGE) IMPLANT
WATER STERILE IRR 1000ML POUR (IV SOLUTION) ×2 IMPLANT
WIPE INSTRUMENT VISIWIPE 73X73 (MISCELLANEOUS) IMPLANT

## 2017-03-24 NOTE — Discharge Instructions (Signed)
*  Follow up appointment with Dr. Zadie Rhine on 03/25/17 at 8:10 AM  *Resume all of your medications tonight

## 2017-03-24 NOTE — Anesthesia Preprocedure Evaluation (Signed)
Anesthesia Evaluation  Patient identified by MRN, date of birth, ID band Patient awake    Reviewed: Allergy & Precautions, NPO status , Patient's Chart, lab work & pertinent test results  Airway Mallampati: II  TM Distance: >3 FB     Dental   Pulmonary asthma , sleep apnea , former smoker,    breath sounds clear to auscultation       Cardiovascular hypertension, + CAD and +CHF   Rhythm:Regular Rate:Normal     Neuro/Psych    GI/Hepatic Neg liver ROS, hiatal hernia, GERD  ,  Endo/Other  diabetes  Renal/GU negative Renal ROS     Musculoskeletal   Abdominal   Peds  Hematology   Anesthesia Other Findings   Reproductive/Obstetrics                             Anesthesia Physical Anesthesia Plan  ASA: III  Anesthesia Plan: MAC   Post-op Pain Management:    Induction: Intravenous  PONV Risk Score and Plan: 2 and Treatment may vary due to age or medical condition, Ondansetron, Dexamethasone and Midazolam  Airway Management Planned: Mask and Nasal Cannula  Additional Equipment:   Intra-op Plan:   Post-operative Plan:   Informed Consent: I have reviewed the patients History and Physical, chart, labs and discussed the procedure including the risks, benefits and alternatives for the proposed anesthesia with the patient or authorized representative who has indicated his/her understanding and acceptance.   Dental advisory given  Plan Discussed with: CRNA and Anesthesiologist  Anesthesia Plan Comments:         Anesthesia Quick Evaluation

## 2017-03-24 NOTE — Transfer of Care (Signed)
Immediate Anesthesia Transfer of Care Note  Patient: TAKIAH MAIDEN  Procedure(s) Performed: PARS PLANA VITRECTOMY WITH 25 GAUGE (Left Eye)  Patient Location: PACU  Anesthesia Type:MAC  Level of Consciousness: awake, alert , oriented and patient cooperative  Airway & Oxygen Therapy: Patient Spontanous Breathing  Post-op Assessment: Report given to RN and Post -op Vital signs reviewed and stable  Post vital signs: Reviewed and stable  Last Vitals:  Vitals:   03/24/17 1316  BP: (!) 173/90  Pulse: 82  Resp: 18  Temp: 36.9 C  SpO2: 100%    Last Pain:  Vitals:   03/24/17 1316  TempSrc: Oral      Patients Stated Pain Goal: 3 (95/74/73 4037)  Complications: No apparent anesthesia complications

## 2017-03-24 NOTE — H&P (Signed)
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  Visual acuity in the left eye the operative eye, is 20/30.  Medical history is been reviewed.  She has no other significant past ocular history.  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

## 2017-03-24 NOTE — Anesthesia Postprocedure Evaluation (Signed)
Anesthesia Post Note  Patient: [name]  Procedure(s) Performed: PARS PLANA VITRECTOMY WITH 25 GAUGE (Left Eye)     Patient location during evaluation: PACU Anesthesia Type: MAC Level of consciousness: awake and alert Pain management: pain level controlled Vital Signs Assessment: post-procedure vital signs reviewed and stable Respiratory status: spontaneous breathing, nonlabored ventilation, respiratory function stable and patient connected to nasal cannula oxygen Cardiovascular status: stable and blood pressure returned to baseline Postop Assessment: no apparent nausea or vomiting Anesthetic complications: no    Last Vitals:  Vitals:   03/24/17 1758 03/24/17 1800  BP: 135/70   Pulse: 73   Resp: 18   Temp:  37.1 C  SpO2: 98%     Last Pain:  Vitals:   03/24/17 1316  TempSrc: Oral                 Cozette Braggs P Armanie Martine

## 2017-03-24 NOTE — Brief Op Note (Signed)
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 Anesthesia local retrobulbar with monitored anesthesia control  Procedure posterior vitrectomy left eye and release of the posterior hyaloid from the nerve macula and peripheral retina out to the equator.  No complications

## 2017-03-24 NOTE — Anesthesia Procedure Notes (Signed)
Procedure Name: MAC Date/Time: 03/24/2017 4:39 PM Performed by: White, Amedeo Plenty, CRNA Pre-anesthesia Checklist: Patient identified, Emergency Drugs available and Suction available Patient Re-evaluated:Patient Re-evaluated prior to induction Oxygen Delivery Method: Nasal cannula

## 2017-03-24 NOTE — Op Note (Signed)
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  Anesthesia local retrobulbar with monitored anesthesia control  Indication for procedure the patient has vision loss affecting her activities daily living has a partial-thickness macular hole of very small size yet with vision nonaffected and a scotoma in her vision. This condition and her cases all on vitreal macular traction. There appears to be no internal limiting membrane component.  For this reason patient is 4 vitrectomy today release today the vitreous traction on the foveal region.  Patient says risk of anesthesia: Recurrent death but also to the eye including not limited to hemorrhage, infection, scarring, need another surgery, no change of vision, loss of vision and progression of disease.  Description procedure after signed consent was obtained patient taken to the operating room. In the operating room probe monitoring was followed by mild sedation. Surgical timeout was carried out with staff and surgeon. [Regions and sterilely anesthetized with Xylocaine 2% retrobulbar with additional 5 mL laterally fashion modified Kirk Ruths.  [Region sterilely prepped and draped usual ophthalmic fashion. Lid speculum applied. Microscope placed position with the biome attachment. 25-gauge trocar placed in the inferotemporal quadrant. Placement verified visually. Infusion turned on. Superior trochars applied. Cortectomy was begun. Graft at this time active suction was then used nasal to the optic nerve to engage posterior hyaloid. This was a very strong attachment and admit nonetheless over about a third 92nd to. I was able to release the posterior hyaloid.inferonasal to the optic nerve and then put elevate posterior hyaloid off the macular  region and the optic nerve region carefully. No excessive force forces were noted upon them perifoveal region.  The hilar was then elevated out to the equator and then circumcised through 69. Care is taken to [facility] the anterior hyaloid in this phakic patient.  At this time the intra-months removed from the eye is a surgical goals of been obtained. Trochars removed from the eye. Subconjunctival Decadron applied. Wounds are secure. Sterile patch and Fox shield applied. Patient awakened without difficulty tolerated procedure well without complication be taken to the PACU.

## 2017-03-25 ENCOUNTER — Encounter (HOSPITAL_COMMUNITY): Payer: Self-pay | Admitting: Ophthalmology

## 2017-05-24 ENCOUNTER — Emergency Department (HOSPITAL_COMMUNITY)
Admission: EM | Admit: 2017-05-24 | Discharge: 2017-05-24 | Disposition: A | Discharge status: Home / Self Care | Payer: 59 | Attending: Emergency Medicine | Admitting: Emergency Medicine

## 2017-05-24 ENCOUNTER — Emergency Department (HOSPITAL_COMMUNITY): Payer: 59

## 2017-05-24 ENCOUNTER — Encounter (HOSPITAL_COMMUNITY): Payer: Self-pay | Admitting: Emergency Medicine

## 2017-05-24 ENCOUNTER — Other Ambulatory Visit: Payer: Self-pay

## 2017-05-24 DIAGNOSIS — Z7982 Long term (current) use of aspirin: Secondary | ICD-10-CM | POA: Insufficient documentation

## 2017-05-24 DIAGNOSIS — R0602 Shortness of breath: Secondary | ICD-10-CM

## 2017-05-24 DIAGNOSIS — E119 Type 2 diabetes mellitus without complications: Secondary | ICD-10-CM | POA: Diagnosis not present

## 2017-05-24 DIAGNOSIS — J45909 Unspecified asthma, uncomplicated: Secondary | ICD-10-CM | POA: Insufficient documentation

## 2017-05-24 DIAGNOSIS — R0981 Nasal congestion: Secondary | ICD-10-CM | POA: Insufficient documentation

## 2017-05-24 DIAGNOSIS — R05 Cough: Secondary | ICD-10-CM | POA: Diagnosis not present

## 2017-05-24 DIAGNOSIS — I509 Heart failure, unspecified: Secondary | ICD-10-CM | POA: Insufficient documentation

## 2017-05-24 DIAGNOSIS — I2510 Atherosclerotic heart disease of native coronary artery without angina pectoris: Secondary | ICD-10-CM | POA: Diagnosis not present

## 2017-05-24 DIAGNOSIS — R059 Cough, unspecified: Secondary | ICD-10-CM

## 2017-05-24 DIAGNOSIS — Z87891 Personal history of nicotine dependence: Secondary | ICD-10-CM | POA: Diagnosis not present

## 2017-05-24 DIAGNOSIS — Z79899 Other long term (current) drug therapy: Secondary | ICD-10-CM | POA: Diagnosis not present

## 2017-05-24 DIAGNOSIS — R0600 Dyspnea, unspecified: Secondary | ICD-10-CM | POA: Diagnosis present

## 2017-05-24 DIAGNOSIS — I110 Hypertensive heart disease with heart failure: Secondary | ICD-10-CM | POA: Insufficient documentation

## 2017-05-24 LAB — CBC WITH DIFFERENTIAL/PLATELET
BASOS PCT: 0 %
Basophils Absolute: 0 10*3/uL (ref 0.0–0.1)
EOS ABS: 0.1 10*3/uL (ref 0.0–0.7)
EOS PCT: 2 %
HCT: 40.3 % (ref 36.0–46.0)
Hemoglobin: 13.8 g/dL (ref 12.0–15.0)
LYMPHS ABS: 2.5 10*3/uL (ref 0.7–4.0)
Lymphocytes Relative: 39 %
MCH: 27.1 pg (ref 26.0–34.0)
MCHC: 34.2 g/dL (ref 30.0–36.0)
MCV: 79.2 fL (ref 78.0–100.0)
MONO ABS: 0.8 10*3/uL (ref 0.1–1.0)
MONOS PCT: 12 %
Neutro Abs: 3 10*3/uL (ref 1.7–7.7)
Neutrophils Relative %: 47 %
Platelets: 270 10*3/uL (ref 150–400)
RBC: 5.09 MIL/uL (ref 3.87–5.11)
RDW: 14.4 % (ref 11.5–15.5)
WBC: 6.4 10*3/uL (ref 4.0–10.5)

## 2017-05-24 LAB — COMPREHENSIVE METABOLIC PANEL
ALK PHOS: 88 U/L (ref 38–126)
ALT: 19 U/L (ref 14–54)
AST: 22 U/L (ref 15–41)
Albumin: 3.8 g/dL (ref 3.5–5.0)
Anion gap: 9 (ref 5–15)
BILIRUBIN TOTAL: 0.5 mg/dL (ref 0.3–1.2)
BUN: 6 mg/dL (ref 6–20)
CALCIUM: 9.2 mg/dL (ref 8.9–10.3)
CO2: 31 mmol/L (ref 22–32)
CREATININE: 0.63 mg/dL (ref 0.44–1.00)
Chloride: 96 mmol/L — ABNORMAL LOW (ref 101–111)
GFR calc Af Amer: >60 mL/min (ref >60)
GFR calc non Af Amer: >60 mL/min (ref >60)
GLUCOSE: 180 mg/dL — AB (ref 65–99)
Potassium: 3.6 mmol/L (ref 3.5–5.1)
Sodium: 136 mmol/L (ref 135–145)
TOTAL PROTEIN: 8.3 g/dL — AB (ref 6.5–8.1)

## 2017-05-24 LAB — I-STAT TROPONIN, ED: TROPONIN I, POC: 0 ng/mL (ref 0.00–0.08)

## 2017-05-24 LAB — BRAIN NATRIURETIC PEPTIDE: B Natriuretic Peptide: 18 pg/mL (ref 0.0–100.0)

## 2017-05-24 IMAGING — DX DG CHEST 2V
2 series · 2 of 2 positions shown · non-contrast
Comparison: [DATE].

CLINICAL DATA: Shortness of breath for 2 weeks with productive
cough for 5 days.

EXAM:
CHEST - 2 VIEW

[chest pa]
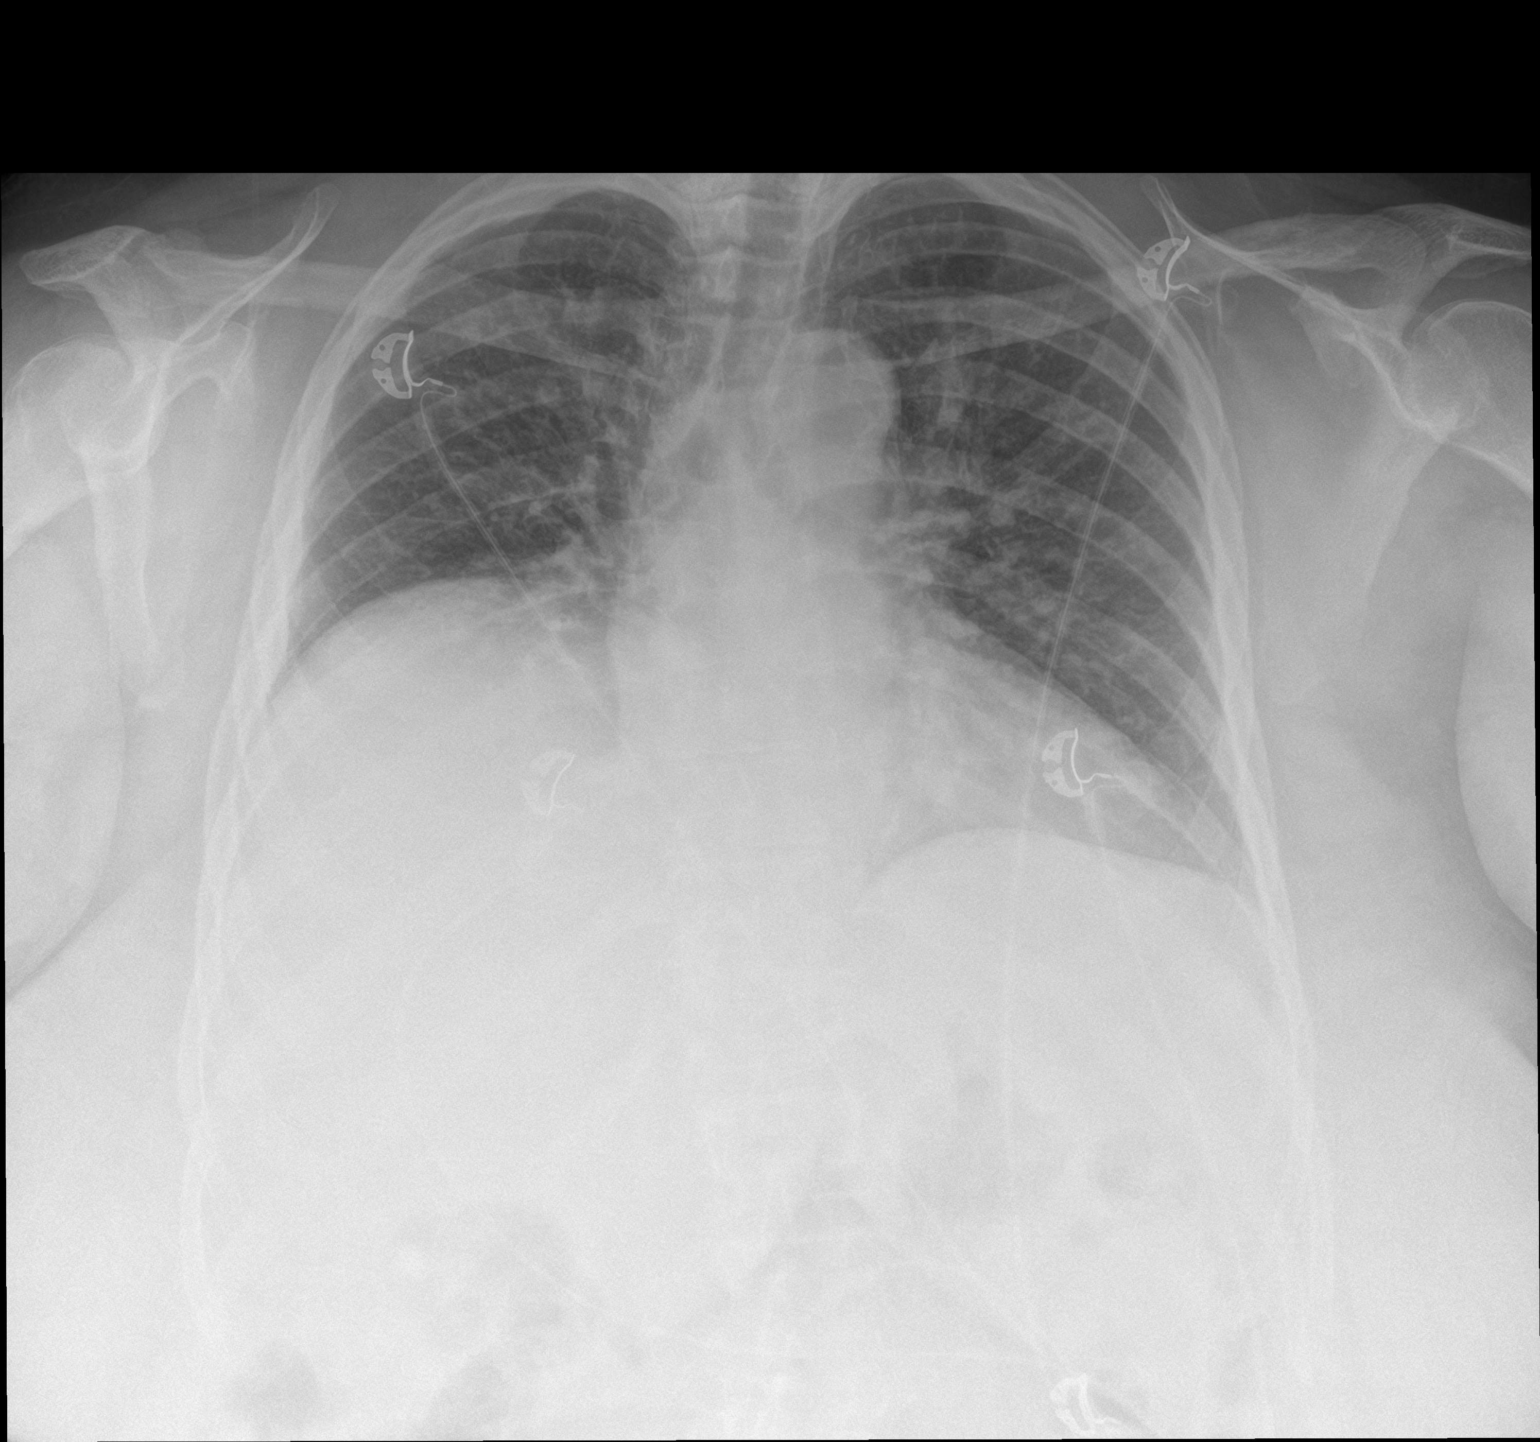

[chest lat]
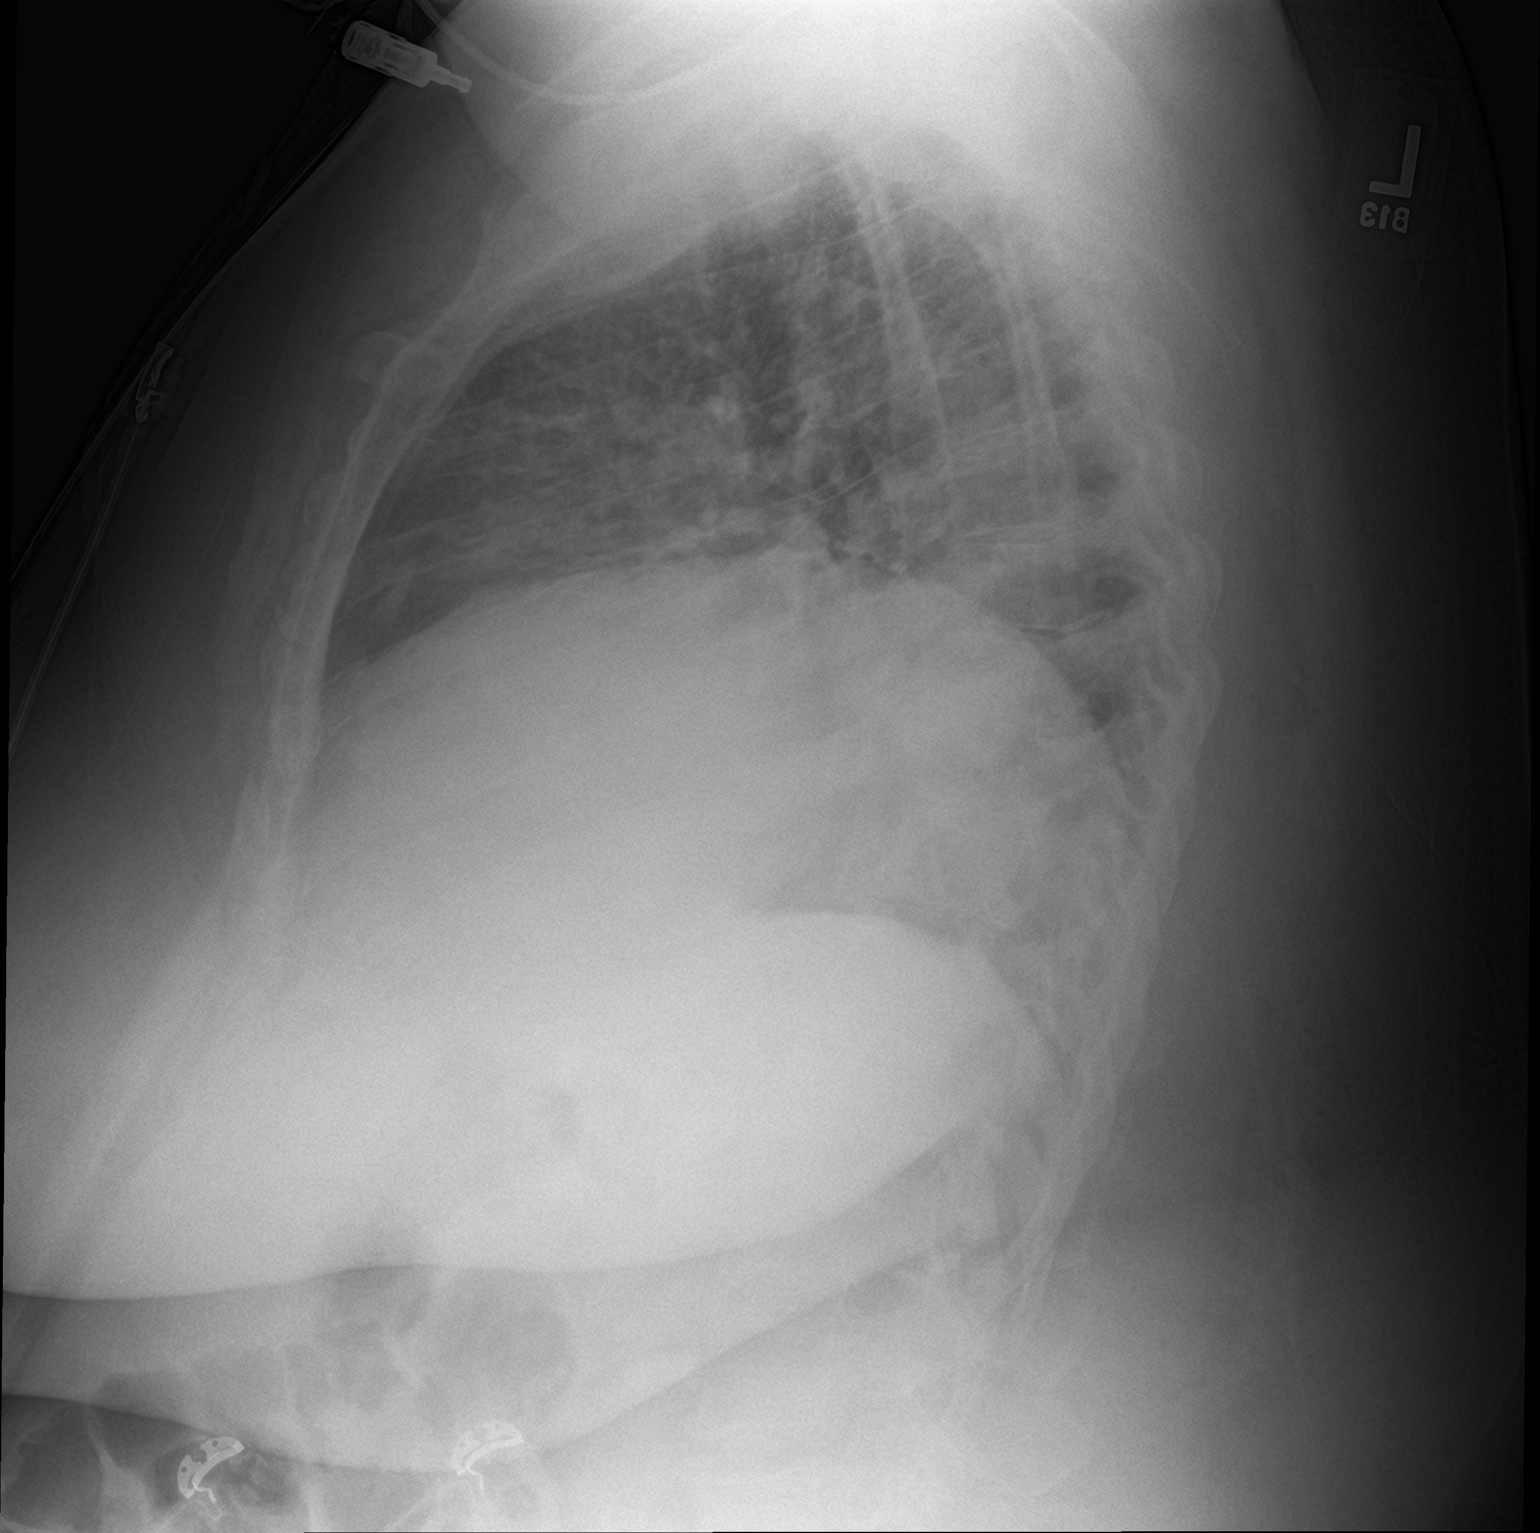

[2 of 2 positions shown; findings below may reference images not displayed]

FINDINGS: Trachea is midline. Heart size stable. Thoracic aorta is calcified.
Lungs are low in volume with a chronically elevated right
hemidiaphragm. No airspace consolidation or pleural fluid.
IMPRESSION: 1. No acute findings.
2. Chronically elevated right hemidiaphragm.
3. Aortic atherosclerosis ([Q4]-170.0).

## 2017-05-24 MED ORDER — FLUTICASONE PROPIONATE 50 MCG/ACT NA SUSP: 2.0000 | Freq: Every day | NASAL | 0 refills | Status: DC

## 2017-05-24 MED ORDER — ALBUTEROL SULFATE (2.5 MG/3ML) 0.083% IN NEBU
5.0000 mg | INHALATION_SOLUTION | Freq: Once | RESPIRATORY_TRACT | Status: AC
  Administered 2017-05-24: 5 mg via RESPIRATORY_TRACT
  Filled 2017-05-24: qty 6

## 2017-05-24 MED ORDER — BENZONATATE 100 MG PO CAPS: 100.0000 mg | ORAL_CAPSULE | Freq: Three times a day (TID) | ORAL | 0 refills | Status: DC | PRN

## 2017-05-24 MED ORDER — ALBUTEROL SULFATE HFA 108 (90 BASE) MCG/ACT IN AERS: 1.0000 | INHALATION_SPRAY | Freq: Four times a day (QID) | RESPIRATORY_TRACT | 0 refills | Status: DC | PRN

## 2017-05-24 MED ORDER — ALBUTEROL SULFATE HFA 108 (90 BASE) MCG/ACT IN AERS
2.0000 | INHALATION_SPRAY | Freq: Once | RESPIRATORY_TRACT | Status: AC
  Administered 2017-05-24: 2 via RESPIRATORY_TRACT
  Filled 2017-05-24: qty 6.7

## 2017-05-24 MED ORDER — IPRATROPIUM-ALBUTEROL 0.5-2.5 (3) MG/3ML IN SOLN
3.0000 mL | Freq: Once | RESPIRATORY_TRACT | Status: AC
  Administered 2017-05-24: 3 mL via RESPIRATORY_TRACT
  Filled 2017-05-24: qty 3

## 2017-05-24 MED ORDER — PREDNISONE 20 MG PO TABS: 40.0000 mg | ORAL_TABLET | Freq: Every day | ORAL | 0 refills | Status: AC

## 2017-05-24 NOTE — ED Triage Notes (Signed)
Disregard ED note- charted on wrong pt

## 2017-05-24 NOTE — ED Notes (Signed)
Disregard prior documentation- charted on wrong pt

## 2017-05-24 NOTE — ED Notes (Signed)
Respiratory called for neb.

## 2017-05-24 NOTE — Discharge Instructions (Signed)

## 2017-05-24 NOTE — ED Triage Notes (Signed)
Pt with c/o shortness of breath x2 weeks but states it has gotten worse. Pt has also had a cough since Thursday.

## 2017-05-24 NOTE — ED Provider Notes (Signed)
Emergency Department Provider Note   I have reviewed the triage vital signs and the nursing notes.   HISTORY  Chief Complaint Shortness of Breath   HPI [name] is a 62 y.o. female with PMH of CAD, Asthma, CHF, DM, GERD, HLD, HTN, and elevated BMI resents to the emergency department for evaluation of continued productive cough with dyspnea and fatigue.  Symptoms have been worsening over the past 5 days.  On Saturday the patient went to urgent care and was prescribed a Z-Pak along with albuterol and a "steroid shot."  Patient states she had a fever on that encounter but is not noticed this at home.  She denies any hemoptysis.  Her cough is productive of clear sputum.  She does not feel like her weight is up or legs are swollen.  She is compliant with her home medications.  Shortness of breath is slightly worse with exertion. She denies any chest pain.   Past Medical History:  Diagnosis Date  . Arthritis   . Asthma   . CHF (congestive heart failure) ([place])   . Coronary artery disease   . Diabetes mellitus   . Diabetic neuropathy ([place])   . GERD (gastroesophageal reflux disease)   . History of hiatal hernia   . Hx of cardiovascular stress test    Lexiscan Myoview (10/15):  Medium area of ischemia in AL and IL distribution, cannot completely exclude shifting breast attenuation, EF 64%; Abnormal study  . Hyperlipidemia   . Hypertension   . Morbid obesity ([place])   . Sleep apnea    does not wear CPAP    Patient Active Problem List   Diagnosis Date Noted  . Coronary atherosclerosis of native coronary artery 07/17/2013  . Mixed hyperlipidemia 07/17/2013  . Essential hypertension, benign 07/17/2013  . Obesity 08/11/2011    Past Surgical History:  Procedure Laterality Date  . [place]   right  . COLONOSCOPY    . DILATION AND CURETTAGE OF UTERUS    . HAND SURGERY     right  . heart stent  2007  . PARS PLANA VITRECTOMY Left 03/24/2017   Procedure:  PARS PLANA VITRECTOMY WITH 25 GAUGE;  Surgeon: Hurman Horn, MD;  Location: [facility];  Service: Ophthalmology;  Laterality: Left;  . UTERINE FIBROID SURGERY  2008    Current Outpatient Rx  . Order #: [id number] Class: Normal  . Order #: [id number] Class: No Print  . Order #: [id number] Class: Historical Med  . Order #: [id number] Class: Normal  . Order #: [id number] Class: Normal  . Order #: [id number] Class: Historical Med  . Order #: [id number] Class: Normal  . Order #: [id number] Class: Historical Med  . Order #: [id number] Class: Historical Med  . Order #: [id number] Class: Normal  . Order #: [id number] Class: Historical Med  . Order #: [id number] Class: Normal  . Order #: [id number] Class: Historical Med  . Order #: [id number] Class: Normal  . Order #: [id number] Class: Historical Med  . Order #: [id number] Class: Normal  . Order #: [id number] Class: Historical Med  . Order #: [id number] Class: Historical Med  . Order #: [id number] Class: Print  . Order #: [id number] Class: Print  . Order #: [id number] Class: Print  . Order #: [id number] Class: Print    Allergies Bystolic [nebivolol hcl]; Crestor [rosuvastatin]; Sulfa antibiotics; Metformin and related; and Other  Family History  Problem Relation Age of Onset  . Kidney disease Father   . Stroke Father   . Heart attack Father   . Stroke Maternal Grandfather   . Stroke  Paternal Grandfather     Social History Social History   Tobacco Use  . Smoking status: Former Smoker    Types: Cigarettes    Last attempt to quit: 08/07/1982    Years since quitting: 34.8  . Smokeless tobacco: Never Used  Substance Use Topics  . Alcohol use: No  . Drug use: No    Review of Systems  Constitutional: No fever/chills. Positive fatigue.  Eyes: No visual changes. ENT: No sore throat. Cardiovascular: Denies chest pain. Respiratory: Positive shortness of breath and cough.  Gastrointestinal: No abdominal pain.  No nausea, no vomiting.  No diarrhea.  No  constipation. Genitourinary: Negative for dysuria. Musculoskeletal: Negative for back pain. Skin: Negative for rash. Neurological: Negative for headaches, focal weakness or numbness.  10-point ROS otherwise negative.  ____________________________________________   PHYSICAL EXAM:  VITAL SIGNS: ED Triage Vitals  Enc Vitals Group     BP 05/24/17 0640 (!) 171/96     Pulse Rate 05/24/17 0640 98     Resp 05/24/17 0640 14     Temp 05/24/17 0640 99.8 F (37.7 C)     Temp Source 05/24/17 0640 Oral     SpO2 05/24/17 0623 95 %     Weight 05/24/17 0634 298 lb (135.2 kg)     Height 05/24/17 0634 5\' 2"  (1.575 m)     Pain Score 05/24/17 0634 0   Constitutional: Alert and oriented. Well appearing and in no acute distress. Eyes: Conjunctivae are normal.  Head: Atraumatic. Nose: No congestion/rhinnorhea. Mouth/Throat: Mucous membranes are moist.   Neck: No stridor.   Cardiovascular: Normal rate, regular rhythm. Good peripheral circulation. Grossly normal heart sounds.   Respiratory: Normal respiratory effort.  No retractions. Lungs with faint end-expiratory wheezing bilaterally.  Gastrointestinal: Soft and nontender. No distention.  Musculoskeletal: No lower extremity tenderness nor edema. No gross deformities of extremities. Neurologic:  Normal speech and language. No gross focal neurologic deficits are appreciated.  Skin:  Skin is warm, dry and intact. No rash noted.  ____________________________________________   LABS (all labs ordered are listed, but only abnormal results are displayed)  Labs Reviewed  COMPREHENSIVE METABOLIC PANEL - Abnormal; Notable for the following components:      Result Value   Chloride 96 (*)    Glucose, Bld 180 (*)    Total Protein 8.3 (*)    All other components within normal limits  BRAIN NATRIURETIC PEPTIDE  CBC WITH DIFFERENTIAL/PLATELET  I-STAT TROPONIN, ED   ____________________________________________  EKG   EKG  Interpretation  Date/Time:  Monday May 24 2017 06:39:40 EDT Ventricular Rate:  94 PR Interval:    QRS Duration: 89 QT Interval:  365 QTC Calculation: 457 R Axis:   -25 Text Interpretation:  Sinus rhythm Borderline left axis deviation Low voltage, precordial leads Baseline wander No significant change since last tracing 26 Aug 2011 Confirmed by Rolland Porter [phone]) on 05/24/2017 6:56:41 AM       ____________________________________________  RADIOLOGY  Dg Chest 2 View  Result Date: 05/24/2017 CLINICAL DATA:  Shortness of breath for 2 weeks with productive cough for 5 days. EXAM: CHEST - 2 VIEW COMPARISON:  04/08/2012. FINDINGS: Trachea is midline. Heart size stable. Thoracic aorta is calcified. Lungs are low in volume with a chronically elevated right hemidiaphragm. No airspace consolidation or pleural fluid. IMPRESSION: 1. No acute findings. 2. Chronically elevated right hemidiaphragm. 3. Aortic atherosclerosis (ICD10-170.0). Electronically Signed   By: Lorin Picket M.D.   On: 05/24/2017 07:38    ____________________________________________  PROCEDURES  Procedure(s) performed:   Procedures  None ____________________________________________   INITIAL IMPRESSION / ASSESSMENT AND PLAN / ED COURSE  Pertinent labs & imaging results that were available during my care of the patient were reviewed by me and considered in my medical decision making (see chart for details).  Patient presents to the emergency department for evaluation of worsening shortness of breath with productive cough and weakness over the past 5 days.  Symptoms are not improved with albuterol at home, Z-Pak, and a steroid shot.  The patient has no signs of volume overload here.  She is not experiencing any chest pain.  Doubt anginal equivalent was shortness of breath.  Given her muted response to initial therapy plan for lab work in addition to chest x-ray.  EKG reviewed with no acute findings.  Chest x-ray  reviewed with no acute infiltrate.  Lab work is unremarkable.  Plan for continued supportive care at home of bronchitis type symptoms.  She is asking if she can discontinue the azithromycin which seems reasonable.   At this time, I do not feel there is any life-threatening condition present. I have reviewed and discussed all results (EKG, imaging, lab, urine as appropriate), exam findings with patient. I have reviewed nursing notes and appropriate previous records.  I feel the patient is safe to be discharged home without further emergent workup. Discussed usual and customary return precautions. Patient and family (if present) verbalize understanding and are comfortable with this plan.  Patient will follow-up with their primary care provider. If they do not have a primary care provider, information for follow-up has been provided to them. All questions have been answered.  ____________________________________________  FINAL CLINICAL IMPRESSION(S) / ED DIAGNOSES  Final diagnoses:  Cough  Nasal congestion  Shortness of breath     MEDICATIONS GIVEN DURING THIS VISIT:  Medications  albuterol (PROVENTIL) (2.5 MG/3ML) 0.083% nebulizer solution 5 mg (5 mg Nebulization Given 05/24/17 0655)  ipratropium-albuterol (DUONEB) 0.5-2.5 (3) MG/3ML nebulizer solution 3 mL (3 mLs Nebulization Given 05/24/17 0752)  albuterol (PROVENTIL HFA;VENTOLIN HFA) 108 (90 Base) MCG/ACT inhaler 2 puff (2 puffs Inhalation Given 05/24/17 0951)     NEW OUTPATIENT MEDICATIONS STARTED DURING THIS VISIT:  Discharge Medication List as of 05/24/2017  8:47 AM    START taking these medications   Details  benzonatate (TESSALON) 100 MG capsule Take 1 capsule (100 mg total) by mouth 3 (three) times daily as needed for cough., Starting Mon 05/24/2017, Print    fluticasone (FLONASE) 50 MCG/ACT nasal spray Place 2 sprays into both nostrils daily for 7 days., Starting Mon 05/24/2017, Until Mon 05/31/2017, Print    predniSONE (DELTASONE)  20 MG tablet Take 2 tablets (40 mg total) by mouth daily for 5 days., Starting Mon 05/24/2017, Until Sat 05/29/2017, Print        Note:  This document was prepared using Dragon voice recognition software and may include unintentional dictation errors.  Nanda Quinton, MD Emergency Medicine    Romell Cavanah, [facility], MD 05/24/17 [phone]

## 2017-08-07 ENCOUNTER — Other Ambulatory Visit: Payer: Self-pay | Admitting: Cardiology

## 2017-08-09 ENCOUNTER — Other Ambulatory Visit: Payer: Self-pay

## 2017-08-09 MED ORDER — AMLODIPINE BESYLATE 10 MG PO TABS: 10.0000 mg | ORAL_TABLET | Freq: Every day | ORAL | 0 refills | Status: DC

## 2017-08-09 MED ORDER — FUROSEMIDE 40 MG PO TABS: 80.0000 mg | ORAL_TABLET | Freq: Every day | ORAL | 0 refills | Status: DC

## 2017-08-09 MED ORDER — CLOPIDOGREL BISULFATE 75 MG PO TABS: 75.0000 mg | ORAL_TABLET | Freq: Every day | ORAL | 0 refills | Status: DC

## 2017-08-09 MED ORDER — LISINOPRIL 40 MG PO TABS: 40.0000 mg | ORAL_TABLET | Freq: Every day | ORAL | 0 refills | Status: DC

## 2017-10-28 ENCOUNTER — Other Ambulatory Visit: Payer: Self-pay | Admitting: Interventional Cardiology

## 2017-10-28 ENCOUNTER — Other Ambulatory Visit: Payer: Self-pay | Admitting: Cardiology

## 2017-11-26 NOTE — Progress Notes (Signed)
Cardiology Office Note   Date:  11/29/2017   ID:  [name], DOB [birth year], MRN [id number]  PCP:  Carol Ada, MD    No chief complaint on file.  CAD  Wt Readings from Last 3 Encounters:  11/29/17 291 lb 3.2 oz (132.1 kg)  05/24/17 298 lb (135.2 kg)  03/24/17 (!) 317 lb (143.8 kg)       History of Present Illness: [name] is a 62 y.o. female  who has had issues with obesity and CAD. Most recent cath showed patent LAD stent in May 2012.   She was approved for lap band surgery. She has seen Dr. Hassell Done. There were issues with her insurance covering the surgery but she thinks these will be resolved. Surgery was postponed because of her mother's illness and caring for her.  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  She was seen in June 2018 due to burning in her legs.    Coreg was increased.  She did not tolerate CPAP in the past for OSA.    Since the last visit, she has had some stress.  She was taken to court by her brother for elder neglect.  She ended up with sole guardianship. Her mother has dementia. Her mother had a valve replacement.   She has lost weight, she thinks from the stress.  She had successful eye surgery as well.   Denies : Chest pain. Dizziness. Leg edema. Nitroglycerin use. Orthopnea. Palpitations. Paroxysmal nocturnal dyspnea. Shortness of breath. Syncope.   Weight loss surgery is still on hold. It is difficult to leave her mother so this has ben a limitation on exercise.   She has been stress eating and eating in the middle of the night.     Past Medical History:  Diagnosis Date  . Arthritis   . Asthma   . CHF (congestive heart failure) ([place])   . Coronary artery disease   . Diabetes mellitus   . Diabetic neuropathy ([place])   . GERD (gastroesophageal reflux disease)   . History of hiatal hernia   . Hx of cardiovascular stress test    Lexiscan Myoview (10/15):  Medium area of ischemia in AL and IL distribution,  cannot completely exclude shifting breast attenuation, EF 64%; Abnormal study  . Hyperlipidemia   . Hypertension   . Morbid obesity ([place])   . Sleep apnea    does not wear CPAP    Past Surgical History:  Procedure Laterality Date  . [place]   right  . COLONOSCOPY    . DILATION AND CURETTAGE OF UTERUS    . HAND SURGERY     right  . heart stent  2007  . PARS PLANA VITRECTOMY Left 03/24/2017   Procedure: PARS PLANA VITRECTOMY WITH 25 GAUGE;  Surgeon: Hurman Horn, MD;  Location: [facility];  Service: Ophthalmology;  Laterality: Left;  . UTERINE FIBROID SURGERY  2008     Current Outpatient Medications  Medication Sig Dispense Refill  . albuterol (PROVENTIL HFA;VENTOLIN HFA) 108 (90 Base) MCG/ACT inhaler Inhale 1-2 puffs into the lungs every 6 (six) hours as needed for wheezing or shortness of breath. 1 Inhaler 0  . amLODipine (NORVASC) 10 MG tablet Take 1 tablet (10 mg total) by mouth daily. Please make overdue appt with Dr. Irish Lack before anymore refills. 2nd attempt 15 tablet 0  . aspirin EC 81 MG tablet Take 1 tablet (81 mg total) by mouth daily.  90 tablet 3  . benzonatate (TESSALON) 100 MG capsule Take 1 capsule (100 mg total) by mouth 3 (three) times daily as needed for cough. 21 capsule 0  . BREO ELLIPTA 100-25 MCG/INH AEPB Inhale 1 puff into the lungs daily.    . carvedilol (COREG) 6.25 MG tablet Take 1 tablet (6.25 mg total) by mouth 2 (two) times daily. Please make overdue appt with Dr. Irish Lack before anymore refills. 2nd attempt 30 tablet 0  . clopidogrel (PLAVIX) 75 MG tablet Take 1 tablet (75 mg total) by mouth daily. Please make overdue appt with Dr. Irish Lack before anymore refills. 2nd attempt 15 tablet 0  . Coenzyme Q10 (COQ10) 400 MG CAPS Take 400 mg by mouth daily.    Marland Kitchen dexlansoprazole (DEXILANT) 60 MG capsule Take 1 capsule (60 mg total) by mouth daily. 30 capsule 11  . Dulaglutide (TRULICITY) 0.08 QP/6.1PJ SOPN Inject 0.75 mg into the skin every  Tuesday.    Marland Kitchen FREESTYLE LITE test strip 1 each by Other route as directed.     . furosemide (LASIX) 40 MG tablet Take 2 tablets (80 mg total) by mouth daily. Please make overdue appt with Dr. Irish Lack before anymore refills. 2nd attempt 30 tablet 0  . HUMULIN R 500 UNIT/ML SOLN injection Inject 80-110 Units into the skin 2 (two) times daily with a meal. 110 units in the morning and 80 units in the evening    . lisinopril (PRINIVIL,ZESTRIL) 40 MG tablet Take 1 tablet (40 mg total) by mouth daily. Please make overdue appt with Dr. Irish Lack before anymore refills. 2nd attempt 15 tablet 0  . naproxen (NAPROSYN) 500 MG tablet Take 1 tablet by mouth 2 (two) times daily as needed for mild pain or moderate pain.     . nitroGLYCERIN (NITROSTAT) 0.4 MG SL tablet Place 1 tablet (0.4 mg total) under the tongue every 5 (five) minutes as needed for chest pain (X 3 DOSES FOR CHEST PAIN). 25 tablet 3  . potassium chloride (K-DUR,KLOR-CON) 10 MEQ tablet Take 1 tablet (10 mEq total) by mouth 2 (two) times daily. Please make overdue appt with Dr. Irish Lack before anymore refills. 2nd attempt 30 tablet 0  . rosuvastatin (CRESTOR) 40 MG tablet Take 40 mg by mouth daily.      No current facility-administered medications for this visit.     Allergies:   Bystolic [nebivolol hcl]; Crestor [rosuvastatin]; Sulfa antibiotics; Metformin and related; and Other    Social History:  The patient  reports that she quit smoking about 35 years ago. Her smoking use included cigarettes. She has never used smokeless tobacco. She reports that she does not drink alcohol or use drugs.   Family History:  The patient's family history includes Heart attack in her father; Kidney disease in her father; Stroke in her father, maternal grandfather, and paternal grandfather.    ROS:  Please see the history of present illness.   Otherwise, review of systems are positive for weight loss.   All other systems are reviewed and negative.    PHYSICAL  EXAM: VS:  BP (!) 146/88   Pulse 89   Ht 5\' 2"  (1.575 m)   Wt 291 lb 3.2 oz (132.1 kg)   LMP  (LMP Unknown)   SpO2 97%   BMI 53.26 kg/m  , BMI Body mass index is 53.26 kg/m. GEN: Well nourished, well developed, in no acute distress  HEENT: normal  Neck: no JVD, carotid bruits, or masses Cardiac: RRR; no murmurs, rubs, or gallops,; tr  pretibial edema bilaterally Respiratory:  clear to auscultation bilaterally, normal work of breathing GI: soft, nontender, nondistended, + BS MS: no deformity or atrophy  Skin: warm and dry, no rash Neuro:  Strength and sensation are intact Psych: euthymic mood, full affect   EKG:   The ekg ordered demonstrates NSR, no ST changes in 4/19   Recent Labs: 05/24/2017: ALT 19; B Natriuretic Peptide 18.0; BUN 6; Creatinine, Ser 0.63; Hemoglobin 13.8; Platelets 270; Potassium 3.6; Sodium 136   Lipid Panel    Component Value Date/Time   CHOL 134 10/02/2016 1007   TRIG 66 10/02/2016 1007   HDL 38 (L) 10/02/2016 1007   CHOLHDL 3.5 10/02/2016 1007   CHOLHDL 6 01/24/2014 1029   VLDL 21.4 01/24/2014 1029   LDLCALC 83 10/02/2016 1007     Other studies Reviewed: Additional studies/ records that were reviewed today with results demonstrating: labs reviewed.   ASSESSMENT AND PLAN:  1. CAD: LAD stent, last noted to be patent on 2012 cath. No angina on medical therapy.  Continue aggressive secondary prevention.  2. Obesity: Finding someone to take care of her elderly mother was a boarrier to weight loss surgery.  3. HTN: Borderline control.  COntinue to try to exercise and lose weight.  4. Hyperlipidemia: LDL 136 in 6/19.  Above target. She forgets the medicine.  She admits to noncompliance.   5. DM: 10.6 in July 2019.  She has lost weight since then.  Stress has reduced also.  She has been stress eating.  Decrease intake of foods with added sugar.   Current medicines are reviewed at length with the patient today.  The patient concerns regarding her  medicines were addressed.  The following changes have been made:  No change  Labs/ tests ordered today include:  No orders of the defined types were placed in this encounter.   Recommend 150 minutes/week of aerobic exercise Low fat, low carb, high fiber diet recommended  Disposition:   FU in 1 year   Signed, Larae Grooms, MD  11/29/2017 10:45 AM    [address], [place], [place]  [address] Phone: [phone]; Fax: [phone]

## 2017-11-29 ENCOUNTER — Ambulatory Visit: Payer: 59 | Admitting: Interventional Cardiology

## 2017-11-29 ENCOUNTER — Encounter (INDEPENDENT_AMBULATORY_CARE_PROVIDER_SITE_OTHER): Payer: Self-pay

## 2017-11-29 ENCOUNTER — Encounter: Payer: Self-pay | Admitting: Interventional Cardiology

## 2017-11-29 VITALS — BP 146/88 | HR 89 | Ht 62.0 in | Wt 291.2 lb

## 2017-11-29 DIAGNOSIS — E1159 Type 2 diabetes mellitus with other circulatory complications: Secondary | ICD-10-CM

## 2017-11-29 DIAGNOSIS — I25119 Atherosclerotic heart disease of native coronary artery with unspecified angina pectoris: Secondary | ICD-10-CM

## 2017-11-29 DIAGNOSIS — E782 Mixed hyperlipidemia: Secondary | ICD-10-CM

## 2017-11-29 DIAGNOSIS — I10 Essential (primary) hypertension: Secondary | ICD-10-CM | POA: Diagnosis not present

## 2017-11-29 NOTE — Patient Instructions (Signed)
Medication Instructions:  Your physician recommends that you continue on your current medications as directed. Please refer to the Current Medication list given to you today.  TAKE rosuvastatin (crestor) 40 mg tablet once a day as prescribed  If you need a refill on your cardiac medications before your next appointment, please call your pharmacy.   Lab work: Your physician recommends that you return for a FASTING lipid profile and liver function panel in 2 months   If you have labs (blood work) drawn today and your tests are completely normal, you will receive your results only by: Marland Kitchen MyChart Message (if you have MyChart) OR . A paper copy in the mail If you have any lab test that is abnormal or we need to change your treatment, we will call you to review the results.  Testing/Procedures: None ordered  Follow-Up: At [facility], you and your health needs are our priority.  As part of our continuing mission to provide you with exceptional heart care, we have created designated Provider Care Teams.  These Care Teams include your primary Cardiologist (physician) and Advanced Practice Providers (APPs -  Physician Assistants and Nurse Practitioners) who all work together to provide you with the care you need, when you need it. . You will need a follow up appointment in 9 months.  Please call our office 2 months in advance to schedule this appointment.  You may see Casandra Doffing, MD or one of the following Advanced Practice Providers on your designated Care Team:   . Lyda Jester, PA-C . Dayna Dunn, PA-C . Ermalinda Barrios, PA-C  Any Other Special Instructions Will Be Listed Below (If Applicable).

## 2017-11-30 ENCOUNTER — Other Ambulatory Visit: Payer: Self-pay | Admitting: *Deleted

## 2017-11-30 MED ORDER — ROSUVASTATIN CALCIUM 40 MG PO TABS: 40.0000 mg | ORAL_TABLET | Freq: Every day | ORAL | 3 refills | Status: DC

## 2017-11-30 MED ORDER — DEXLANSOPRAZOLE 60 MG PO CPDR: 60.0000 mg | DELAYED_RELEASE_CAPSULE | Freq: Every day | ORAL | 3 refills | Status: DC

## 2017-11-30 MED ORDER — CARVEDILOL 6.25 MG PO TABS: 6.2500 mg | ORAL_TABLET | Freq: Two times a day (BID) | ORAL | 3 refills | Status: DC

## 2017-11-30 MED ORDER — NITROGLYCERIN 0.4 MG SL SUBL: 0.4000 mg | SUBLINGUAL_TABLET | SUBLINGUAL | 3 refills | Status: DC | PRN

## 2017-11-30 MED ORDER — CLOPIDOGREL BISULFATE 75 MG PO TABS: 75.0000 mg | ORAL_TABLET | Freq: Every day | ORAL | 3 refills | Status: DC

## 2017-11-30 MED ORDER — FUROSEMIDE 40 MG PO TABS: 80.0000 mg | ORAL_TABLET | Freq: Every day | ORAL | 3 refills | Status: DC

## 2017-11-30 MED ORDER — LISINOPRIL 40 MG PO TABS: 40.0000 mg | ORAL_TABLET | Freq: Every day | ORAL | 3 refills | Status: DC

## 2017-11-30 MED ORDER — ASPIRIN EC 81 MG PO TBEC: 81.0000 mg | DELAYED_RELEASE_TABLET | Freq: Every day | ORAL | 3 refills | Status: DC

## 2017-11-30 MED ORDER — AMLODIPINE BESYLATE 10 MG PO TABS: 10.0000 mg | ORAL_TABLET | Freq: Every day | ORAL | 3 refills | Status: DC

## 2017-11-30 MED ORDER — POTASSIUM CHLORIDE CRYS ER 10 MEQ PO TBCR: 10.0000 meq | EXTENDED_RELEASE_TABLET | Freq: Two times a day (BID) | ORAL | 3 refills | Status: DC

## 2017-11-30 NOTE — Telephone Encounter (Signed)
Patient called and was upset as she was seen yesterday for the purpose of having her medications refilled and she has checked with the pharmacy several times and they still do not have the rx's. She went on to say that she wrote on the paper that she was provided at check in that she needed refills as well as ask but they still were not ordered. Okay to refill statin for one yr, last lipid is over 1 yr but pt stated that she is coming in January for labs? Please advise. Thanks, MI

## 2017-11-30 NOTE — Telephone Encounter (Signed)
Called and spoke to patient and apologized for her meds not being sent in at her visit. All cardiac meds were sent to patient's preferred pharmacy. Patient denies any additional concerns at this time and appreciated the call.

## 2018-02-14 ENCOUNTER — Other Ambulatory Visit: Payer: 59 | Admitting: *Deleted

## 2018-02-14 DIAGNOSIS — I25119 Atherosclerotic heart disease of native coronary artery with unspecified angina pectoris: Secondary | ICD-10-CM

## 2018-02-14 DIAGNOSIS — E782 Mixed hyperlipidemia: Secondary | ICD-10-CM

## 2018-02-14 DIAGNOSIS — E1159 Type 2 diabetes mellitus with other circulatory complications: Secondary | ICD-10-CM

## 2018-02-14 DIAGNOSIS — I10 Essential (primary) hypertension: Secondary | ICD-10-CM

## 2018-02-14 LAB — LIPID PANEL
CHOL/HDL RATIO: 3.1 ratio (ref 0.0–4.4)
Cholesterol, Total: 135 mg/dL (ref 100–199)
HDL: 44 mg/dL (ref >39)
LDL CALC: 80 mg/dL (ref 0–99)
TRIGLYCERIDES: 56 mg/dL (ref 0–149)
VLDL Cholesterol Cal: 11 mg/dL (ref 5–40)

## 2018-02-14 LAB — HEPATIC FUNCTION PANEL
ALBUMIN: 4.3 g/dL (ref 3.6–4.8)
ALK PHOS: 114 IU/L (ref 39–117)
ALT: 20 IU/L (ref 0–32)
AST: 18 IU/L (ref 0–40)
BILIRUBIN, DIRECT: 0.11 mg/dL (ref 0.00–0.40)
Bilirubin Total: 0.4 mg/dL (ref 0.0–1.2)
TOTAL PROTEIN: 7.3 g/dL (ref 6.0–8.5)

## 2018-04-26 ENCOUNTER — Other Ambulatory Visit (HOSPITAL_COMMUNITY)
Admission: RE | Admit: 2018-04-26 | Discharge: 2018-04-26 | Disposition: A | Discharge status: Home / Self Care | Payer: 59 | Source: Ambulatory Visit | Attending: Obstetrics and Gynecology | Admitting: Obstetrics and Gynecology

## 2018-04-26 ENCOUNTER — Other Ambulatory Visit: Payer: Self-pay | Admitting: Obstetrics and Gynecology

## 2018-04-26 DIAGNOSIS — Z01419 Encounter for gynecological examination (general) (routine) without abnormal findings: Secondary | ICD-10-CM | POA: Insufficient documentation

## 2018-04-28 LAB — CYTOLOGY - PAP
CHLAMYDIA, DNA PROBE: NEGATIVE
Diagnosis: NEGATIVE
HPV: NOT DETECTED
NEISSERIA GONORRHEA: NEGATIVE

## 2018-12-28 ENCOUNTER — Other Ambulatory Visit: Payer: Self-pay | Admitting: Interventional Cardiology

## 2018-12-30 NOTE — Telephone Encounter (Signed)
Pt's pharmacy is requesting a refill on dexlansoprazole. Would Dr. Irish Lack like to refill this medication? Please address

## 2019-01-12 NOTE — Telephone Encounter (Signed)
Defer to PCP

## 2019-03-02 NOTE — Progress Notes (Signed)
Cardiology Office Note   Date:  03/03/2019   ID:  [name], DOB 1956/01/15, MRN [id number]  PCP:  Carol Ada, MD    No chief complaint on file.  CAD  Wt Readings from Last 3 Encounters:  03/03/19 296 lb 6.4 oz (134.4 kg)  11/29/17 291 lb 3.2 oz (132.1 kg)  05/24/17 298 lb (135.2 kg)       History of Present Illness: [name] is a 64 y.o. female  who has had issues with obesity and CAD. Most recent cath showed patent LAD stent in May 2012.   She was approved for lap band surgery. She has seen Dr. Hassell Done. There were issues with her insurance covering the surgery but she thinks these will be resolved. Surgery was postponed because of her mother's illness and caring for her.  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  She was seen in June 2018 due to burning in her legs.   Coreg was increased.  She did not tolerate CPAP in the past for OSA.   In 2019, she has had some stress.  She was taken to court by her brother for elder neglect.  She ended up with sole guardianship. Her mother has dementia. Her mother had a valve replacement. She  lost weight then, she thinks from the stress.  She had successful eye surgery as well in 2019.   Since the last visit, her mother's dementia is worse.  Now the Mom has a UTI.    Denies : Chest pain. Dizziness. Leg edema. Nitroglycerin use. Orthopnea. Palpitations. Paroxysmal nocturnal dyspnea. Syncope.       Past Medical History:  Diagnosis Date  . Arthritis   . Asthma   . CHF (congestive heart failure) ([place])   . Coronary artery disease   . Diabetes mellitus   . Diabetic neuropathy ([place])   . GERD (gastroesophageal reflux disease)   . History of hiatal hernia   . Hx of cardiovascular stress test    Lexiscan Myoview (10/15):  Medium area of ischemia in AL and IL distribution, cannot completely exclude shifting breast attenuation, EF 64%; Abnormal study  . Hyperlipidemia   . Hypertension   .  Morbid obesity ([place])   . Sleep apnea    does not wear CPAP    Past Surgical History:  Procedure Laterality Date  . [place]   right  . COLONOSCOPY    . DILATION AND CURETTAGE OF UTERUS    . HAND SURGERY     right  . heart stent  2007  . PARS PLANA VITRECTOMY Left 03/24/2017   Procedure: PARS PLANA VITRECTOMY WITH 25 GAUGE;  Surgeon: Hurman Horn, MD;  Location: [facility];  Service: Ophthalmology;  Laterality: Left;  . UTERINE FIBROID SURGERY  2008     Current Outpatient Medications  Medication Sig Dispense Refill  . albuterol (PROVENTIL HFA;VENTOLIN HFA) 108 (90 Base) MCG/ACT inhaler Inhale 1-2 puffs into the lungs every 6 (six) hours as needed for wheezing or shortness of breath. 1 Inhaler 0  . amLODipine (NORVASC) 10 MG tablet Take 1 tablet (10 mg total) by mouth daily. Please make overdue appt with Dr. Irish Lack before anymore refills. 1st attempt 30 tablet 0  . aspirin EC 81 MG tablet Take 1 tablet (81 mg total) by mouth daily. 90 tablet 3  . BREO ELLIPTA 100-25 MCG/INH AEPB Inhale 1 puff into the lungs daily.    . carvedilol (COREG)  6.25 MG tablet Take 1 tablet (6.25 mg total) by mouth 2 (two) times daily. 180 tablet 3  . clopidogrel (PLAVIX) 75 MG tablet Take 1 tablet (75 mg total) by mouth daily. Please make overdue appt with Dr. Irish Lack before anymore refills. 1st attempt 30 tablet 0  . Coenzyme Q10 (COQ10) 400 MG CAPS Take 400 mg by mouth daily.    Marland Kitchen dexlansoprazole (DEXILANT) 60 MG capsule Take 1 capsule (60 mg total) by mouth daily. 90 capsule 3  . Dulaglutide (TRULICITY) A999333 0000000 SOPN Inject 0.75 mg into the skin every Tuesday.    Marland Kitchen FREESTYLE LITE test strip 1 each by Other route as directed.     . furosemide (LASIX) 40 MG tablet Take 2 tablets (80 mg total) by mouth daily. Please make overdue appt with Dr. Irish Lack before anymore refills. 1st attempt 60 tablet 0  . HUMULIN R 500 UNIT/ML SOLN injection Inject 80-110 Units into the skin 2 (two) times  daily with a meal. 110 units in the morning and 80 units in the evening    . lisinopril (ZESTRIL) 40 MG tablet Take 1 tablet (40 mg total) by mouth daily. Please make overdue appt with Dr. Irish Lack before anymore refills. 1st attempt 30 tablet 0  . nitroGLYCERIN (NITROSTAT) 0.4 MG SL tablet Place 1 tablet (0.4 mg total) under the tongue every 5 (five) minutes as needed for chest pain (X 3 DOSES FOR CHEST PAIN). 25 tablet 3  . potassium chloride (KLOR-CON) 10 MEQ tablet Take 1 tablet (10 mEq total) by mouth 2 (two) times daily. Please make overdue appt with Dr. Irish Lack before anymore refills. 1st attempt 60 tablet 0  . rosuvastatin (CRESTOR) 40 MG tablet Take 1 tablet (40 mg total) by mouth daily. 90 tablet 3   No current facility-administered medications for this visit.    Allergies:   Bystolic [nebivolol hcl], Crestor [rosuvastatin], Sulfa antibiotics, Metformin and related, and Other    Social History:  The patient  reports that she quit smoking about 36 years ago. Her smoking use included cigarettes. She has never used smokeless tobacco. She reports that she does not drink alcohol or use drugs.   Family History:  The patient's family history includes Heart attack in her father; Kidney disease in her father; Stroke in her father, maternal grandfather, and paternal grandfather.    ROS:  Please see the history of present illness.   Otherwise, review of systems are positive for LE edema.   All other systems are reviewed and negative.    PHYSICAL EXAM: VS:  BP (!) 178/90   Pulse 88   Ht 5\' 2"  (1.575 m)   Wt 296 lb 6.4 oz (134.4 kg)   LMP  (LMP Unknown)   SpO2 93%   BMI 54.21 kg/m  , BMI Body mass index is 54.21 kg/m. GEN: Well nourished, well developed, in no acute distress  HEENT: normal  Neck: no JVD, carotid bruits, or masses Cardiac: RRR; no murmurs, rubs, or gallops,; 1+ LE edema  Respiratory:  clear to auscultation bilaterally, normal work of breathing GI: soft, nontender,  nondistended, + BS MS: no deformity or atrophy  Skin: warm and dry, no rash Neuro:  Strength and sensation are intact Psych: euthymic mood, full affect   EKG:   The ekg ordered today demonstrates NSR, no ST segment changes   Recent Labs: No results found for requested labs within last 8760 hours.   Lipid Panel    Component Value Date/Time   CHOL 135  02/14/2018 0829   TRIG 56 02/14/2018 0829   HDL 44 02/14/2018 0829   CHOLHDL 3.1 02/14/2018 0829   CHOLHDL 6 01/24/2014 1029   VLDL 21.4 01/24/2014 1029   LDLCALC 80 02/14/2018 0829     Other studies Reviewed: Additional studies/ records that were reviewed today with results demonstrating: labs reviewed.   ASSESSMENT AND PLAN:  1. CAD:  No angina. Continue aggressive secondary prevention.  2. Obesity: Stable.  Weight loss surgery currently on hold. 3. HTN: High today.  She did not take her meds last night. 4. Hyperlipidemia: LDL 145 in Jan 2021.  She forgets the Crestor at night.  Liver and lipids and 3 months. 5. DM: 7.9 A1C, above target. Healthy diet.  Has been higher in the past.     Current medicines are reviewed at length with the patient today.  The patient concerns regarding her medicines were addressed.  The following changes have been made:  No change  Labs/ tests ordered today include:  No orders of the defined types were placed in this encounter.   Recommend 150 minutes/week of aerobic exercise Low fat, low carb, high fiber diet recommended  Medication compliance emphasized  Disposition:   FU in 1 year   Signed, Larae Grooms, MD  03/03/2019 4:28 PM    [facility] [address], [place], [place]  [address] Phone: [phone]; Fax: [phone]

## 2019-03-03 ENCOUNTER — Other Ambulatory Visit: Payer: Self-pay

## 2019-03-03 ENCOUNTER — Encounter: Payer: Self-pay | Admitting: Interventional Cardiology

## 2019-03-03 ENCOUNTER — Ambulatory Visit: Payer: 59 | Admitting: Interventional Cardiology

## 2019-03-03 VITALS — BP 178/90 | HR 88 | Ht 62.0 in | Wt 296.4 lb

## 2019-03-03 DIAGNOSIS — E782 Mixed hyperlipidemia: Secondary | ICD-10-CM

## 2019-03-03 DIAGNOSIS — I10 Essential (primary) hypertension: Secondary | ICD-10-CM

## 2019-03-03 DIAGNOSIS — E1159 Type 2 diabetes mellitus with other circulatory complications: Secondary | ICD-10-CM

## 2019-03-03 DIAGNOSIS — I25119 Atherosclerotic heart disease of native coronary artery with unspecified angina pectoris: Secondary | ICD-10-CM | POA: Diagnosis not present

## 2019-03-03 MED ORDER — CLOPIDOGREL BISULFATE 75 MG PO TABS: 75.0000 mg | ORAL_TABLET | Freq: Every day | ORAL | 3 refills | Status: DC

## 2019-03-03 MED ORDER — CARVEDILOL 6.25 MG PO TABS: 6.2500 mg | ORAL_TABLET | Freq: Two times a day (BID) | ORAL | 3 refills | Status: DC

## 2019-03-03 MED ORDER — POTASSIUM CHLORIDE CRYS ER 10 MEQ PO TBCR: 10.0000 meq | EXTENDED_RELEASE_TABLET | Freq: Two times a day (BID) | ORAL | 3 refills | Status: DC

## 2019-03-03 MED ORDER — ROSUVASTATIN CALCIUM 40 MG PO TABS: 40.0000 mg | ORAL_TABLET | Freq: Every day | ORAL | 3 refills | Status: DC

## 2019-03-03 MED ORDER — LISINOPRIL 40 MG PO TABS: 40.0000 mg | ORAL_TABLET | Freq: Every day | ORAL | 3 refills | Status: DC

## 2019-03-03 MED ORDER — FUROSEMIDE 40 MG PO TABS: 80.0000 mg | ORAL_TABLET | Freq: Every day | ORAL | 3 refills | Status: DC

## 2019-03-03 MED ORDER — AMLODIPINE BESYLATE 10 MG PO TABS: 10.0000 mg | ORAL_TABLET | Freq: Every day | ORAL | 3 refills | Status: DC

## 2019-03-03 MED ORDER — ASPIRIN EC 81 MG PO TBEC: 81.0000 mg | DELAYED_RELEASE_TABLET | Freq: Every day | ORAL | 3 refills | Status: AC

## 2019-03-03 MED ORDER — DEXILANT 60 MG PO CPDR: 60.0000 mg | DELAYED_RELEASE_CAPSULE | Freq: Every day | ORAL | 3 refills | Status: DC

## 2019-03-03 NOTE — Patient Instructions (Signed)
Medication Instructions:  Your physician recommends that you continue on your current medications as directed. Please refer to the Current Medication list given to you today.  *If you need a refill on your cardiac medications before your next appointment, please call your pharmacy*  Lab Work: Your physician recommends that you return for a FASTING lipid profile and liver function panel in 3 months   If you have labs (blood work) drawn today and your tests are completely normal, you will receive your results only by: Marland Kitchen MyChart Message (if you have MyChart) OR . A paper copy in the mail If you have any lab test that is abnormal or we need to change your treatment, we will call you to review the results.  Testing/Procedures: None ordered  Follow-Up: At [facility], you and your health needs are our priority.  As part of our continuing mission to provide you with exceptional heart care, we have created designated Provider Care Teams.  These Care Teams include your primary Cardiologist (physician) and Advanced Practice Providers (APPs -  Physician Assistants and Nurse Practitioners) who all work together to provide you with the care you need, when you need it.  Your next appointment:   12 month(s)  The format for your next appointment:   In Person  Provider:   You may see Larae Grooms, MD or one of the following Advanced Practice Providers on your designated Care Team:    Melina Copa, PA-C  Ermalinda Barrios, PA-C   Other Instructions   Low-Sodium Eating Plan Sodium, which is an element that makes up salt, helps you maintain a healthy balance of fluids in your body. Too much sodium can increase your blood pressure and cause fluid and waste to be held in your body. Your health care provider or dietitian may recommend following this plan if you have high blood pressure (hypertension), kidney disease, liver disease, or heart failure. Eating less sodium can help lower your blood  pressure, reduce swelling, and protect your heart, liver, and kidneys. What are tips for following this plan? General guidelines  Most people on this plan should limit their sodium intake to 1,500-2,000 mg (milligrams) of sodium each day. Reading food labels   The Nutrition Facts label lists the amount of sodium in one serving of the food. If you eat more than one serving, you must multiply the listed amount of sodium by the number of servings.  Choose foods with less than 140 mg of sodium per serving.  Avoid foods with 300 mg of sodium or more per serving. Shopping  Look for lower-sodium products, often labeled as "low-sodium" or "no salt added."  Always check the sodium content even if foods are labeled as "unsalted" or "no salt added".  Buy fresh foods. ? Avoid canned foods and premade or frozen meals. ? Avoid canned, cured, or processed meats  Buy breads that have less than 80 mg of sodium per slice. Cooking  Eat more home-cooked food and less restaurant, buffet, and fast food.  Avoid adding salt when cooking. Use salt-free seasonings or herbs instead of table salt or sea salt. Check with your health care provider or pharmacist before using salt substitutes.  Cook with plant-based oils, such as canola, sunflower, or olive oil. Meal planning  When eating at a restaurant, ask that your food be prepared with less salt or no salt, if possible.  Avoid foods that contain MSG (monosodium glutamate). MSG is sometimes added to [place] food, bouillon, and some canned foods. What  foods are recommended? The items listed may not be a complete list. Talk with your dietitian about what dietary choices are best for you. Grains Low-sodium cereals, including oats, puffed wheat and rice, and shredded wheat. Low-sodium crackers. Unsalted rice. Unsalted pasta. Low-sodium bread. Whole-grain breads and whole-grain pasta. Vegetables Fresh or frozen vegetables. "No salt added" canned vegetables.  "No salt added" tomato sauce and paste. Low-sodium or reduced-sodium tomato and vegetable juice. Fruits Fresh, frozen, or canned fruit. Fruit juice. Meats and other protein foods Fresh or frozen (no salt added) meat, poultry, seafood, and fish. Low-sodium canned tuna and salmon. Unsalted nuts. Dried peas, beans, and lentils without added salt. Unsalted canned beans. Eggs. Unsalted nut butters. Dairy Milk. Soy milk. Cheese that is naturally low in sodium, such as ricotta cheese, fresh mozzarella, or Swiss cheese Low-sodium or reduced-sodium cheese. Cream cheese. Yogurt. Fats and oils Unsalted butter. Unsalted margarine with no trans fat. Vegetable oils such as canola or olive oils. Seasonings and other foods Fresh and dried herbs and spices. Salt-free seasonings. Low-sodium mustard and ketchup. Sodium-free salad dressing. Sodium-free light mayonnaise. Fresh or refrigerated horseradish. Lemon juice. Vinegar. Homemade, reduced-sodium, or low-sodium soups. Unsalted popcorn and pretzels. Low-salt or salt-free chips. What foods are not recommended? The items listed may not be a complete list. Talk with your dietitian about what dietary choices are best for you. Grains Instant hot cereals. Bread stuffing, pancake, and biscuit mixes. Croutons. Seasoned rice or pasta mixes. Noodle soup cups. Boxed or frozen macaroni and cheese. Regular salted crackers. Self-rising flour. Vegetables Sauerkraut, pickled vegetables, and relishes. Olives. [place] fries. Onion rings. Regular canned vegetables (not low-sodium or reduced-sodium). Regular canned tomato sauce and paste (not low-sodium or reduced-sodium). Regular tomato and vegetable juice (not low-sodium or reduced-sodium). Frozen vegetables in sauces. Meats and other protein foods Meat or fish that is salted, canned, smoked, spiced, or pickled. Bacon, ham, sausage, hotdogs, corned beef, chipped beef, packaged lunch meats, salt pork, jerky, pickled herring,  anchovies, regular canned tuna, sardines, salted nuts. Dairy Processed cheese and cheese spreads. Cheese curds. Blue cheese. Feta cheese. String cheese. Regular cottage cheese. Buttermilk. Canned milk. Fats and oils Salted butter. Regular margarine. Ghee. Bacon fat. Seasonings and other foods Onion salt, garlic salt, seasoned salt, table salt, and sea salt. Canned and packaged gravies. Worcestershire sauce. Tartar sauce. Barbecue sauce. Teriyaki sauce. Soy sauce, including reduced-sodium. Steak sauce. Fish sauce. Oyster sauce. Cocktail sauce. Horseradish that you find on the shelf. Regular ketchup and mustard. Meat flavorings and tenderizers. Bouillon cubes. Hot sauce and Tabasco sauce. Premade or packaged marinades. Premade or packaged taco seasonings. Relishes. Regular salad dressings. Salsa. Potato and tortilla chips. Corn chips and puffs. Salted popcorn and pretzels. Canned or dried soups. Pizza. Frozen entrees and pot pies. Summary  Eating less sodium can help lower your blood pressure, reduce swelling, and protect your heart, liver, and kidneys.  Most people on this plan should limit their sodium intake to 1,500-2,000 mg (milligrams) of sodium each day.  Canned, boxed, and frozen foods are high in sodium. Restaurant foods, fast foods, and pizza are also very high in sodium. You also get sodium by adding salt to food.  Try to cook at home, eat more fresh fruits and vegetables, and eat less fast food, canned, processed, or prepared foods. This information is not intended to replace advice given to you by your health care provider. Make sure you discuss any questions you have with your health care provider. Document Revised: 01/08/2017 Document Reviewed: 01/20/2016 Elsevier  Patient Education  [employer].

## 2019-06-01 ENCOUNTER — Other Ambulatory Visit: Payer: 59 | Admitting: *Deleted

## 2019-06-01 ENCOUNTER — Other Ambulatory Visit: Payer: Self-pay

## 2019-06-01 DIAGNOSIS — I25119 Atherosclerotic heart disease of native coronary artery with unspecified angina pectoris: Secondary | ICD-10-CM

## 2019-06-01 DIAGNOSIS — I10 Essential (primary) hypertension: Secondary | ICD-10-CM

## 2019-06-01 DIAGNOSIS — E782 Mixed hyperlipidemia: Secondary | ICD-10-CM

## 2019-06-01 DIAGNOSIS — E1159 Type 2 diabetes mellitus with other circulatory complications: Secondary | ICD-10-CM

## 2019-06-01 LAB — LIPID PANEL
Chol/HDL Ratio: 3.4 ratio (ref 0.0–4.4)
Cholesterol, Total: 129 mg/dL (ref 100–199)
HDL: 38 mg/dL — ABNORMAL LOW (ref >39)
LDL Chol Calc (NIH): 78 mg/dL (ref 0–99)
Triglycerides: 59 mg/dL (ref 0–149)
VLDL Cholesterol Cal: 13 mg/dL (ref 5–40)

## 2019-06-01 LAB — HEPATIC FUNCTION PANEL
ALT: 11 IU/L (ref 0–32)
AST: 13 IU/L (ref 0–40)
Albumin: 4.1 g/dL (ref 3.8–4.8)
Alkaline Phosphatase: 94 IU/L (ref 39–117)
Bilirubin Total: 0.4 mg/dL (ref 0.0–1.2)
Bilirubin, Direct: 0.14 mg/dL (ref 0.00–0.40)
Total Protein: 7.6 g/dL (ref 6.0–8.5)

## 2019-06-03 ENCOUNTER — Other Ambulatory Visit: Payer: Self-pay

## 2019-06-03 ENCOUNTER — Encounter (HOSPITAL_BASED_OUTPATIENT_CLINIC_OR_DEPARTMENT_OTHER): Payer: Self-pay | Admitting: Emergency Medicine

## 2019-06-03 ENCOUNTER — Emergency Department (HOSPITAL_BASED_OUTPATIENT_CLINIC_OR_DEPARTMENT_OTHER)
Admission: EM | Admit: 2019-06-03 | Discharge: 2019-06-03 | Disposition: A | Discharge status: Home / Self Care | Payer: 59 | Attending: Emergency Medicine | Admitting: Emergency Medicine

## 2019-06-03 DIAGNOSIS — R1310 Dysphagia, unspecified: Secondary | ICD-10-CM | POA: Diagnosis not present

## 2019-06-03 DIAGNOSIS — Z794 Long term (current) use of insulin: Secondary | ICD-10-CM | POA: Diagnosis not present

## 2019-06-03 DIAGNOSIS — Z79899 Other long term (current) drug therapy: Secondary | ICD-10-CM | POA: Insufficient documentation

## 2019-06-03 DIAGNOSIS — E1140 Type 2 diabetes mellitus with diabetic neuropathy, unspecified: Secondary | ICD-10-CM | POA: Diagnosis not present

## 2019-06-03 DIAGNOSIS — J45909 Unspecified asthma, uncomplicated: Secondary | ICD-10-CM | POA: Diagnosis not present

## 2019-06-03 DIAGNOSIS — Z7902 Long term (current) use of antithrombotics/antiplatelets: Secondary | ICD-10-CM | POA: Diagnosis not present

## 2019-06-03 DIAGNOSIS — R5383 Other fatigue: Secondary | ICD-10-CM

## 2019-06-03 DIAGNOSIS — N3000 Acute cystitis without hematuria: Secondary | ICD-10-CM

## 2019-06-03 DIAGNOSIS — Z7982 Long term (current) use of aspirin: Secondary | ICD-10-CM | POA: Diagnosis not present

## 2019-06-03 DIAGNOSIS — R110 Nausea: Secondary | ICD-10-CM | POA: Diagnosis not present

## 2019-06-03 DIAGNOSIS — Z87891 Personal history of nicotine dependence: Secondary | ICD-10-CM | POA: Insufficient documentation

## 2019-06-03 DIAGNOSIS — I110 Hypertensive heart disease with heart failure: Secondary | ICD-10-CM | POA: Diagnosis not present

## 2019-06-03 DIAGNOSIS — I509 Heart failure, unspecified: Secondary | ICD-10-CM | POA: Insufficient documentation

## 2019-06-03 LAB — COMPREHENSIVE METABOLIC PANEL
ALT: 17 U/L (ref 0–44)
AST: 17 U/L (ref 15–41)
Albumin: 4.2 g/dL (ref 3.5–5.0)
Alkaline Phosphatase: 77 U/L (ref 38–126)
Anion gap: 11 (ref 5–15)
BUN: 8 mg/dL (ref 8–23)
CO2: 27 mmol/L (ref 22–32)
Calcium: 9.7 mg/dL (ref 8.9–10.3)
Chloride: 101 mmol/L (ref 98–111)
Creatinine, Ser: 0.7 mg/dL (ref 0.44–1.00)
GFR calc Af Amer: >60 mL/min (ref >60)
GFR calc non Af Amer: >60 mL/min (ref >60)
Glucose, Bld: 133 mg/dL — ABNORMAL HIGH (ref 70–99)
Potassium: 3.6 mmol/L (ref 3.5–5.1)
Sodium: 139 mmol/L (ref 135–145)
Total Bilirubin: 0.6 mg/dL (ref 0.3–1.2)
Total Protein: 8.4 g/dL — ABNORMAL HIGH (ref 6.5–8.1)

## 2019-06-03 LAB — CBC WITH DIFFERENTIAL/PLATELET
Abs Immature Granulocytes: 0.02 10*3/uL (ref 0.00–0.07)
Basophils Absolute: 0 10*3/uL (ref 0.0–0.1)
Basophils Relative: 1 %
Eosinophils Absolute: 0.1 10*3/uL (ref 0.0–0.5)
Eosinophils Relative: 1 %
HCT: 41 % (ref 36.0–46.0)
Hemoglobin: 14.4 g/dL (ref 12.0–15.0)
Immature Granulocytes: 0 %
Lymphocytes Relative: 31 %
Lymphs Abs: 2.5 10*3/uL (ref 0.7–4.0)
MCH: 27.6 pg (ref 26.0–34.0)
MCHC: 35.1 g/dL (ref 30.0–36.0)
MCV: 78.7 fL — ABNORMAL LOW (ref 80.0–100.0)
Monocytes Absolute: 0.5 10*3/uL (ref 0.1–1.0)
Monocytes Relative: 6 %
Neutro Abs: 4.9 10*3/uL (ref 1.7–7.7)
Neutrophils Relative %: 61 %
Platelets: 305 10*3/uL (ref 150–400)
RBC: 5.21 MIL/uL — ABNORMAL HIGH (ref 3.87–5.11)
RDW: 14.1 % (ref 11.5–15.5)
WBC: 8 10*3/uL (ref 4.0–10.5)
nRBC: 0 % (ref 0.0–0.2)

## 2019-06-03 LAB — CBG MONITORING, ED: Glucose-Capillary: 128 mg/dL — ABNORMAL HIGH (ref 70–99)

## 2019-06-03 LAB — URINALYSIS, ROUTINE W REFLEX MICROSCOPIC
Bilirubin Urine: NEGATIVE
Glucose, UA: NEGATIVE mg/dL
Hgb urine dipstick: NEGATIVE
Ketones, ur: 15 mg/dL — AB
Nitrite: NEGATIVE
Protein, ur: NEGATIVE mg/dL
Specific Gravity, Urine: 1.025 (ref 1.005–1.030)
pH: 6 (ref 5.0–8.0)

## 2019-06-03 LAB — URINALYSIS, MICROSCOPIC (REFLEX)

## 2019-06-03 MED ORDER — ONDANSETRON 4 MG PO TBDP
4.0000 mg | ORAL_TABLET | Freq: Three times a day (TID) | ORAL | 0 refills | Status: DC | PRN
Start: 2019-06-03 — End: 2019-06-03

## 2019-06-03 MED ORDER — ONDANSETRON 4 MG PO TBDP
4.0000 mg | ORAL_TABLET | Freq: Three times a day (TID) | ORAL | 0 refills | Status: DC | PRN
Start: 2019-06-03 — End: 2020-03-04

## 2019-06-03 MED ORDER — CEPHALEXIN 500 MG PO CAPS
500.0000 mg | ORAL_CAPSULE | Freq: Two times a day (BID) | ORAL | 0 refills | Status: AC
Start: 2019-06-03 — End: 2019-06-10

## 2019-06-03 MED ORDER — ONDANSETRON 4 MG PO TBDP
4.0000 mg | ORAL_TABLET | Freq: Once | ORAL | Status: AC
  Administered 2019-06-03: 10:00:00 4 mg via ORAL
  Filled 2019-06-03: qty 1

## 2019-06-03 MED ORDER — CEPHALEXIN 500 MG PO CAPS
500.0000 mg | ORAL_CAPSULE | Freq: Two times a day (BID) | ORAL | 0 refills | Status: DC
Start: 2019-06-03 — End: 2019-06-03

## 2019-06-03 NOTE — ED Triage Notes (Signed)
Pt presents with multiple complaints x 1 month. Endorses throat tightness like something is obstructing airway, tingling in legs after sitting on commode, mild diarrhea, nausea, general unwell feeling.

## 2019-06-03 NOTE — ED Provider Notes (Signed)
[facility] Provider Note   CSN: [id number] Arrival date & time: 06/03/19  0840     History Chief Complaint  Patient presents with  . Multiple Complaints    [name] is a 64 y.o. female.  HPI      Off and on over the last month has had symptoms Was "out of wack" with DM and htn, but working on getting things back on track now with Dr Dayton Scrape like something in throat will come and go over last month-can't remember exactly how long, but has noticed it more recently  Nausea has been going on for a while but worse over the last week Worse riding in car  Had tingling/legs going to sleep while on commode but no other numbness/weakness/change in speech or vision  This AM woke up at 2AM Started on some anxiety medications with dr  Mild abdominal pain, suprapubic started today, thought maybe had food poisoning  Diarrhea off and on, not severe, had some last night, loose stool not severe  No vomiting, feeling nauseas and gagging Vision clouding up at times, physicians thought it was from sugars  No chest pain or shortness of breath, no syncope  Past Medical History:  Diagnosis Date  . Arthritis   . Asthma   . CHF (congestive heart failure) ([place])   . Coronary artery disease   . Diabetes mellitus   . Diabetic neuropathy ([place])   . GERD (gastroesophageal reflux disease)   . History of hiatal hernia   . Hx of cardiovascular stress test    Lexiscan Myoview (10/15):  Medium area of ischemia in AL and IL distribution, cannot completely exclude shifting breast attenuation, EF 64%; Abnormal study  . Hyperlipidemia   . Hypertension   . Morbid obesity ([place])   . Sleep apnea    does not wear CPAP    Patient Active Problem List   Diagnosis Date Noted  . Coronary atherosclerosis of native coronary artery 07/17/2013  . Mixed hyperlipidemia 07/17/2013  . Essential hypertension, benign 07/17/2013  . Obesity 08/11/2011    Past Surgical  History:  Procedure Laterality Date  . [place]   right  . COLONOSCOPY    . DILATION AND CURETTAGE OF UTERUS    . HAND SURGERY     right  . heart stent  2007  . PARS PLANA VITRECTOMY Left 03/24/2017   Procedure: PARS PLANA VITRECTOMY WITH 25 GAUGE;  Surgeon: Hurman Horn, MD;  Location: [facility];  Service: Ophthalmology;  Laterality: Left;  . UTERINE FIBROID SURGERY  2008     OB History   No obstetric history on file.     Family History  Problem Relation Age of Onset  . Kidney disease Father   . Stroke Father   . Heart attack Father   . Stroke Maternal Grandfather   . Stroke Paternal Grandfather     Social History   Tobacco Use  . Smoking status: Former Smoker    Types: Cigarettes    Quit date: 08/07/1982    Years since quitting: 36.8  . Smokeless tobacco: Never Used  Substance Use Topics  . Alcohol use: No  . Drug use: No    Home Medications Prior to Admission medications   Medication Sig Start Date End Date Taking? Authorizing Provider  albuterol (PROVENTIL HFA;VENTOLIN HFA) 108 (90 Base) MCG/ACT inhaler Inhale 1-2 puffs into the lungs every 6 (six) hours as needed for wheezing or shortness of breath. 05/24/17  Long, Wonda Olds, MD  amLODipine (NORVASC) 10 MG tablet Take 1 tablet (10 mg total) by mouth daily. 03/03/19   Jettie Booze, MD  aspirin EC 81 MG tablet Take 1 tablet (81 mg total) by mouth daily. 03/03/19   Jettie Booze, MD  BREO ELLIPTA 100-25 MCG/INH AEPB Inhale 1 puff into the lungs daily. 09/21/16   [provider]  carvedilol (COREG) 6.25 MG tablet Take 1 tablet (6.25 mg total) by mouth 2 (two) times daily. 03/03/19   Jettie Booze, MD  cephALEXin (KEFLEX) 500 MG capsule Take 1 capsule (500 mg total) by mouth 2 (two) times daily for 7 days. 06/03/19 06/10/19  Gareth Morgan, MD  clopidogrel (PLAVIX) 75 MG tablet Take 1 tablet (75 mg total) by mouth daily. 03/03/19   Jettie Booze, MD  Coenzyme Q10  (COQ10) 400 MG CAPS Take 400 mg by mouth daily.    [provider]  dexlansoprazole (DEXILANT) 60 MG capsule Take 1 capsule (60 mg total) by mouth daily. 03/03/19   Jettie Booze, MD  Dulaglutide (TRULICITY) [id number] [id number] SOPN Inject 0.75 mg into the skin every Tuesday.    [provider]  FREESTYLE LITE test strip 1 each by Other route as directed.  07/08/11   [provider]  furosemide (LASIX) 40 MG tablet Take 2 tablets (80 mg total) by mouth daily. 03/03/19   Jettie Booze, MD  HUMULIN R 500 UNIT/ML SOLN injection Inject 80-110 Units into the skin 2 (two) times daily with a meal. 110 units in the morning and 80 units in the evening 12/25/13   [provider]  lisinopril (ZESTRIL) 40 MG tablet Take 1 tablet (40 mg total) by mouth daily. 03/03/19   Jettie Booze, MD  nitroGLYCERIN (NITROSTAT) 0.4 MG SL tablet Place 1 tablet (0.4 mg total) under the tongue every 5 (five) minutes as needed for chest pain (X 3 DOSES FOR CHEST PAIN). 11/30/17   Jettie Booze, MD  ondansetron (ZOFRAN ODT) 4 MG disintegrating tablet Take 1 tablet (4 mg total) by mouth every 8 (eight) hours as needed for nausea or vomiting. 06/03/19   Gareth Morgan, MD  potassium chloride (KLOR-CON) 10 MEQ tablet Take 1 tablet (10 mEq total) by mouth 2 (two) times daily. 03/03/19   Jettie Booze, MD  rosuvastatin (CRESTOR) 40 MG tablet Take 1 tablet (40 mg total) by mouth daily. 03/03/19   Jettie Booze, MD    Allergies    Bystolic [nebivolol hcl], Crestor [rosuvastatin], Sulfa antibiotics, Metformin and related, and Other  Review of Systems   Review of Systems  Constitutional: Positive for appetite change and fatigue. Negative for fever.  HENT: Positive for congestion (seasonal allergies). Negative for sore throat.   Eyes: Negative for visual disturbance (not new today, has had over month intermittent clouding thought to be due to glucose fluctuations).    Respiratory: Negative for cough (only when brushing teeth or outside with pollen) and shortness of breath.   Cardiovascular: Negative for chest pain and leg swelling.  Gastrointestinal: Positive for abdominal pain, diarrhea and nausea. Negative for vomiting.  Genitourinary: Negative for difficulty urinating and hematuria.  Musculoskeletal: Negative for back pain and neck pain.  Skin: Negative for rash.  Neurological: Negative for syncope, facial asymmetry, weakness, numbness and headaches.    Physical Exam Updated Vital Signs BP (!) 185/98   Pulse 92   Temp 98.1 F (36.7 C)   Resp 18   Ht 5\' 2"  (1.575  m)   Wt 127 kg   LMP  (LMP Unknown)   SpO2 98%   BMI 51.21 kg/m   Physical Exam Vitals and nursing note reviewed.  Constitutional:      General: She is not in acute distress.    Appearance: She is well-developed. She is not diaphoretic.  HENT:     Head: Normocephalic and atraumatic.  Eyes:     Conjunctiva/sclera: Conjunctivae normal.  Cardiovascular:     Rate and Rhythm: Normal rate and regular rhythm.     Heart sounds: Normal heart sounds. No murmur. No friction rub. No gallop.   Pulmonary:     Effort: Pulmonary effort is normal. No respiratory distress.     Breath sounds: Normal breath sounds. No wheezing or rales.  Abdominal:     General: There is no distension.     Palpations: Abdomen is soft.     Tenderness: There is no abdominal tenderness. There is no guarding.  Musculoskeletal:        General: No tenderness.     Cervical back: Normal range of motion.  Skin:    General: Skin is warm and dry.     Findings: No erythema or rash.  Neurological:     Mental Status: She is alert and oriented to person, place, and time.     ED Results / Procedures / Treatments   Labs (all labs ordered are listed, but only abnormal results are displayed) Labs Reviewed  CBC WITH DIFFERENTIAL/PLATELET - Abnormal; Notable for the following components:      Result Value   RBC 5.21  (*)    MCV 78.7 (*)    All other components within normal limits  COMPREHENSIVE METABOLIC PANEL - Abnormal; Notable for the following components:   Glucose, Bld 133 (*)    Total Protein 8.4 (*)    All other components within normal limits  URINALYSIS, ROUTINE W REFLEX MICROSCOPIC - Abnormal; Notable for the following components:   APPearance CLOUDY (*)    Ketones, ur 15 (*)    Leukocytes,Ua MODERATE (*)    All other components within normal limits  URINALYSIS, MICROSCOPIC (REFLEX) - Abnormal; Notable for the following components:   Bacteria, UA FEW (*)    All other components within normal limits  CBG MONITORING, ED - Abnormal; Notable for the following components:   Glucose-Capillary 128 (*)    All other components within normal limits  URINE CULTURE    EKG EKG Interpretation  Date/Time:  Saturday June 03 2019 09:49:35 EDT Ventricular Rate:  86 PR Interval:    QRS Duration: 80 QT Interval:  364 QTC Calculation: 436 R Axis:   8 Text Interpretation: Sinus rhythm No significant change since last tracing Confirmed by Gareth Morgan [phone]) on 06/03/2019 10:09:01 AM   Radiology No results found.  Procedures Procedures (including critical care time)  Medications Ordered in ED Medications  ondansetron (ZOFRAN-ODT) disintegrating tablet 4 mg (4 mg Oral Given 06/03/19 1019)    ED Course  I have reviewed the triage vital signs and the nursing notes.  Pertinent labs & imaging results that were available during my care of the patient were reviewed by me and considered in my medical decision making (see chart for details).    MDM Rules/Calculators/A&P                      64 year old female with a history of asthma, CHF, coronary artery disease, diabetes, hypertension, hyperlipidemia, obesity, who presents with 1 month  of "feeling sick", with nausea, fatigue, intermittent foreign body sensation with swallowing, decreased appetite.   EKG shows normal sinus rhythm without  acute changes, no sign of cardiac arrhythmia today as etiology of symptoms and no chest pain/dyspnea nor exertional component to suggest acute ischemia for symptoms going on over last month.  Regarding difficulty swallowing--normal exam without swelling, with normal palate elevation, and given duration of symptoms, lack of fever, good rom of neck, do not see signs of RPA, epiglottitis, PTA, doubt angioedema.  She is tolerating po.  Recommend outpatient ENT follow up regarding these symptoms of dysphagia.  Regarding fatigue, labs show no significant abnormalities. UA with leukocytes and given the symptoms suspect likely UTI as etiology of symptoms. Given rx for zofran, keflex. Recommend continued outpatient follow up and follow up with ENT for dysphagia. Patient discharged in stable condition with understanding of reasons to return.        Final Clinical Impression(s) / ED Diagnoses Final diagnoses:  Fatigue, unspecified type  Nausea  Acute cystitis without hematuria  Dysphagia, unspecified type    Rx / DC Orders ED Discharge Orders         Ordered    cephALEXin (KEFLEX) 500 MG capsule  2 times daily,   Status:  Discontinued     06/03/19 1053    ondansetron (ZOFRAN ODT) 4 MG disintegrating tablet  Every 8 hours PRN,   Status:  Discontinued     06/03/19 1053    cephALEXin (KEFLEX) 500 MG capsule  2 times daily     06/03/19 1115    ondansetron (ZOFRAN ODT) 4 MG disintegrating tablet  Every 8 hours PRN     06/03/19 1115           Gareth Morgan, MD 06/03/19 2153

## 2019-06-04 ENCOUNTER — Emergency Department (HOSPITAL_COMMUNITY): Payer: 59

## 2019-06-04 ENCOUNTER — Other Ambulatory Visit: Payer: Self-pay

## 2019-06-04 ENCOUNTER — Encounter (HOSPITAL_COMMUNITY): Payer: Self-pay | Admitting: Emergency Medicine

## 2019-06-04 ENCOUNTER — Emergency Department (HOSPITAL_COMMUNITY)
Admission: EM | Admit: 2019-06-04 | Discharge: 2019-06-04 | Disposition: A | Discharge status: Home / Self Care | Payer: 59 | Attending: Emergency Medicine | Admitting: Emergency Medicine

## 2019-06-04 DIAGNOSIS — N83202 Unspecified ovarian cyst, left side: Secondary | ICD-10-CM | POA: Diagnosis not present

## 2019-06-04 DIAGNOSIS — R110 Nausea: Secondary | ICD-10-CM | POA: Insufficient documentation

## 2019-06-04 DIAGNOSIS — N281 Cyst of kidney, acquired: Secondary | ICD-10-CM | POA: Insufficient documentation

## 2019-06-04 DIAGNOSIS — R1013 Epigastric pain: Secondary | ICD-10-CM | POA: Insufficient documentation

## 2019-06-04 DIAGNOSIS — N3000 Acute cystitis without hematuria: Secondary | ICD-10-CM

## 2019-06-04 DIAGNOSIS — R1030 Lower abdominal pain, unspecified: Secondary | ICD-10-CM

## 2019-06-04 LAB — CBC
HCT: 41 % (ref 36.0–46.0)
Hemoglobin: 14.2 g/dL (ref 12.0–15.0)
MCH: 27.7 pg (ref 26.0–34.0)
MCHC: 34.6 g/dL (ref 30.0–36.0)
MCV: 80.1 fL (ref 80.0–100.0)
Platelets: 300 10*3/uL (ref 150–400)
RBC: 5.12 MIL/uL — ABNORMAL HIGH (ref 3.87–5.11)
RDW: 13.7 % (ref 11.5–15.5)
WBC: 7.9 10*3/uL (ref 4.0–10.5)
nRBC: 0 % (ref 0.0–0.2)

## 2019-06-04 LAB — URINALYSIS, ROUTINE W REFLEX MICROSCOPIC
Bilirubin Urine: NEGATIVE
Glucose, UA: NEGATIVE mg/dL
Hgb urine dipstick: NEGATIVE
Ketones, ur: NEGATIVE mg/dL
Leukocytes,Ua: NEGATIVE
Nitrite: NEGATIVE
Protein, ur: NEGATIVE mg/dL
Specific Gravity, Urine: >1.046 — ABNORMAL HIGH (ref 1.005–1.030)
pH: 6 (ref 5.0–8.0)

## 2019-06-04 LAB — COMPREHENSIVE METABOLIC PANEL
ALT: 18 U/L (ref 0–44)
AST: 21 U/L (ref 15–41)
Albumin: 3.9 g/dL (ref 3.5–5.0)
Alkaline Phosphatase: 77 U/L (ref 38–126)
Anion gap: 9 (ref 5–15)
BUN: 7 mg/dL — ABNORMAL LOW (ref 8–23)
CO2: 28 mmol/L (ref 22–32)
Calcium: 9.7 mg/dL (ref 8.9–10.3)
Chloride: 99 mmol/L (ref 98–111)
Creatinine, Ser: 0.66 mg/dL (ref 0.44–1.00)
GFR calc Af Amer: >60 mL/min (ref >60)
GFR calc non Af Amer: >60 mL/min (ref >60)
Glucose, Bld: 134 mg/dL — ABNORMAL HIGH (ref 70–99)
Potassium: 3.4 mmol/L — ABNORMAL LOW (ref 3.5–5.1)
Sodium: 136 mmol/L (ref 135–145)
Total Bilirubin: 0.4 mg/dL (ref 0.3–1.2)
Total Protein: 8 g/dL (ref 6.5–8.1)

## 2019-06-04 LAB — URINE CULTURE

## 2019-06-04 LAB — CBG MONITORING, ED: Glucose-Capillary: 83 mg/dL (ref 70–99)

## 2019-06-04 LAB — LIPASE, BLOOD: Lipase: 24 U/L (ref 11–51)

## 2019-06-04 IMAGING — CT CT ABD-PELV W/ CM
2 of 5 series · 16 of 46 positions shown, 18 images · IV contrast (Omni 300)
Comparison: None.

CLINICAL DATA: 64-year-old female with abdominal pain.

EXAM:
CT ABDOMEN AND PELVIS WITH CONTRAST
TECHNIQUE: Multidetector CT imaging of the abdomen and pelvis was performed
using the standard protocol following bolus administration of
intravenous contrast.
CONTRAST:  100mL OMNIPAQUE IOHEXOL 300 MG/ML  SOLN

[Series 3: a/p w/ 5mm · axial · 0.98mm/px · z∈[+645,+1090]mm · 13 of 101 slices shown, 15 images]
[im 6/101  soft-tissue]
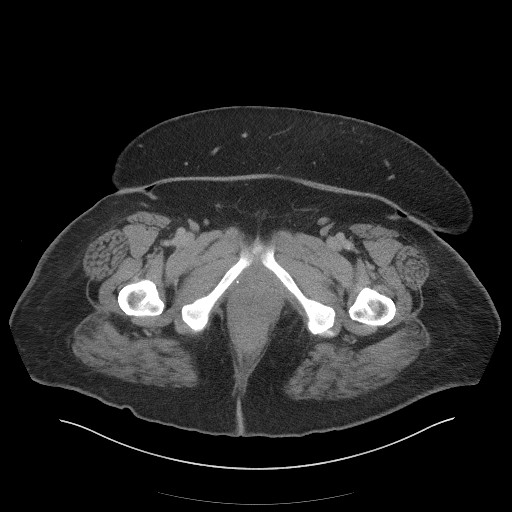
[im 6/101  bone]
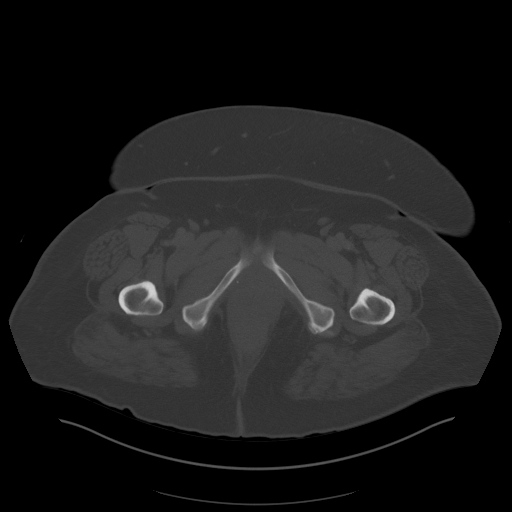
[im 12/101  soft-tissue]
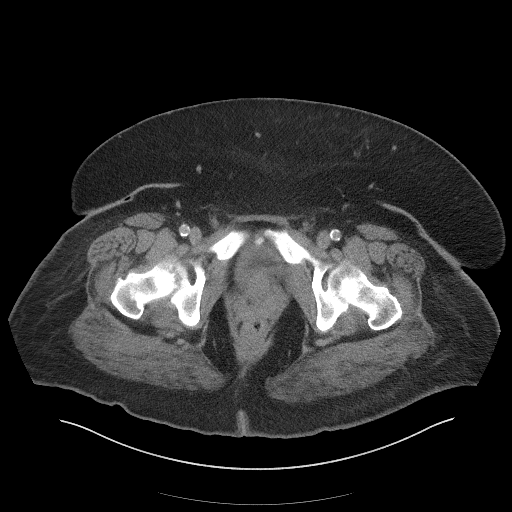
[im 23/101  soft-tissue]
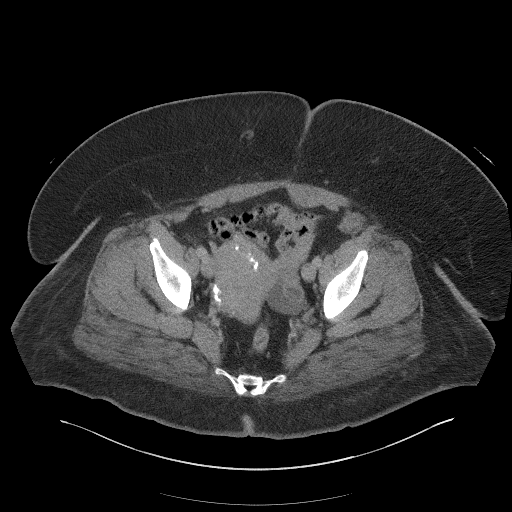
[im 28/101  soft-tissue]
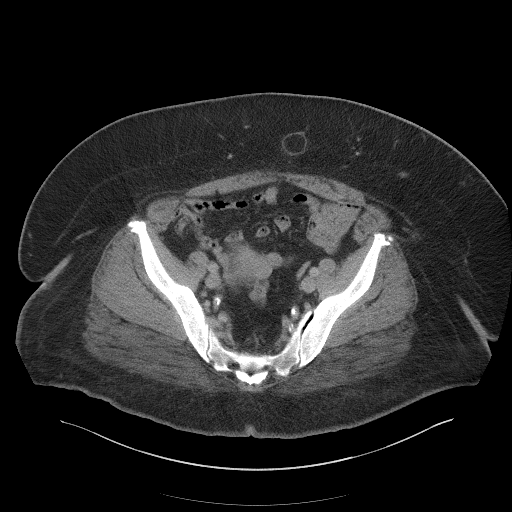
[im 34/101  soft-tissue]
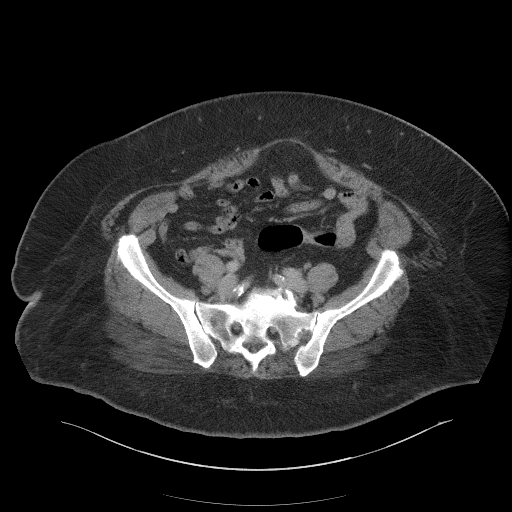
[im 45/101  soft-tissue]
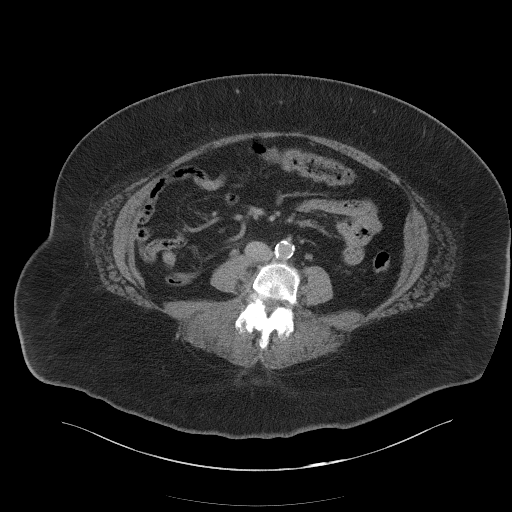
[im 51/101  soft-tissue]
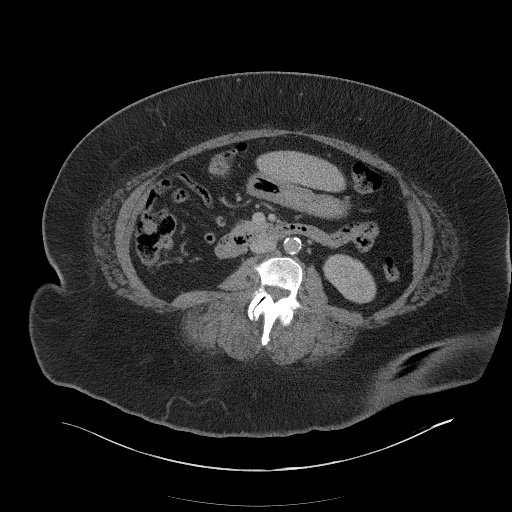
[im 56/101  soft-tissue]
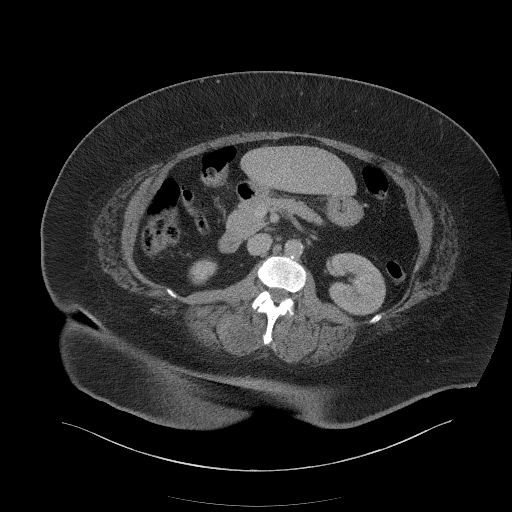
[im 67/101  soft-tissue]
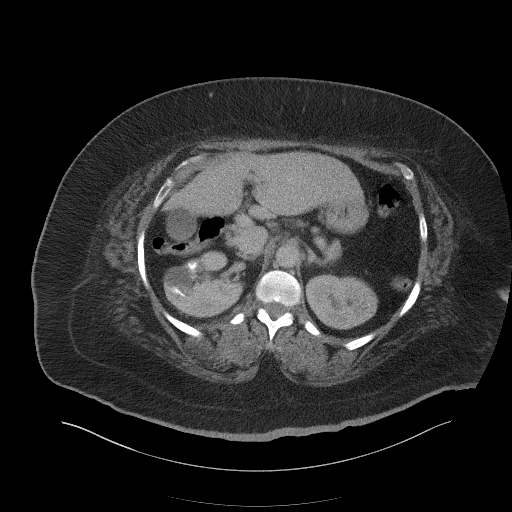
[im 67/101  bone]
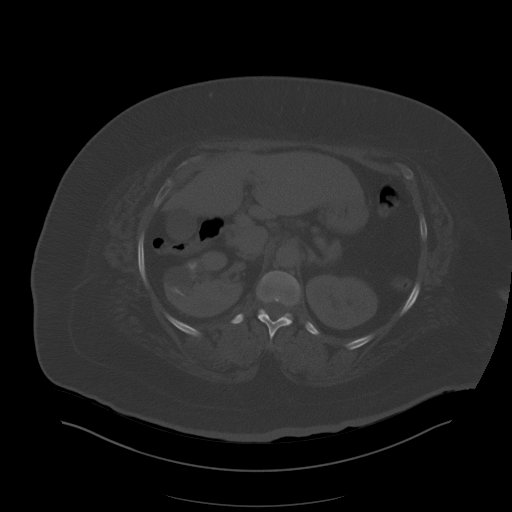
[im 73/101  soft-tissue]
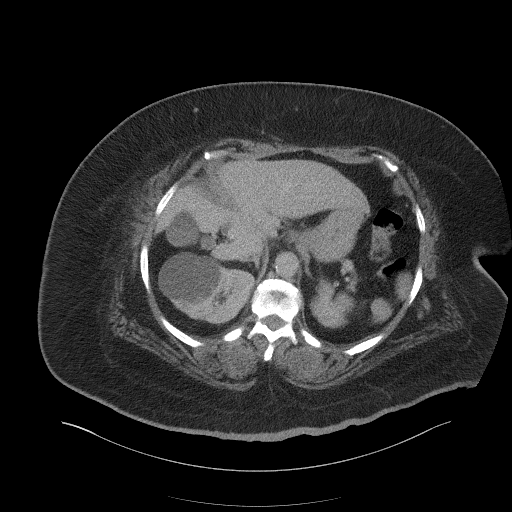
[im 78/101  soft-tissue]
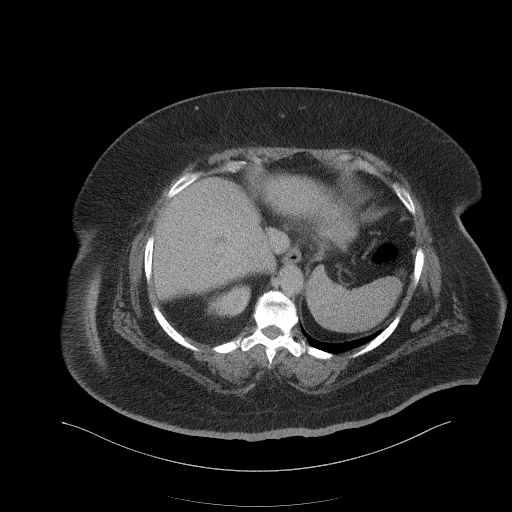
[im 89/101  soft-tissue]
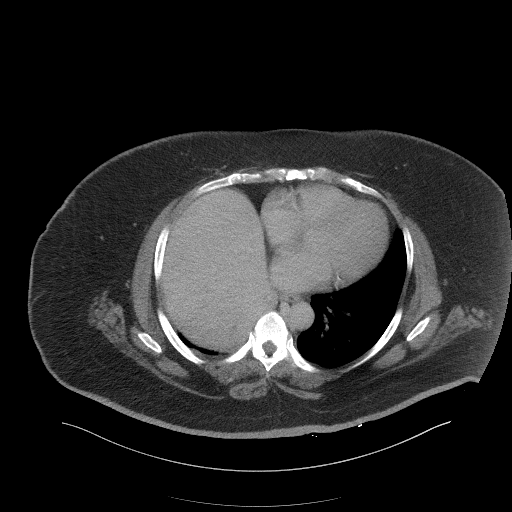
[im 95/101  soft-tissue]
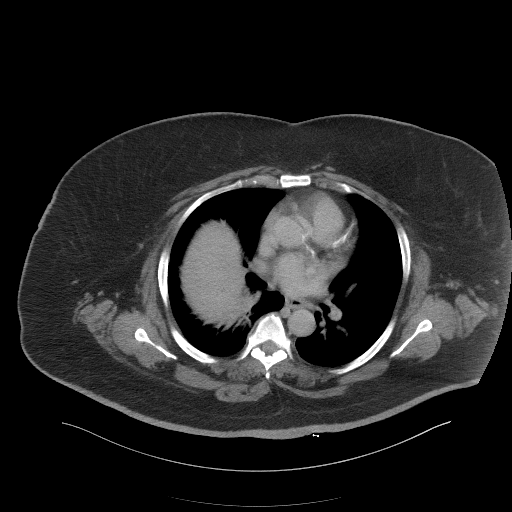

[Series 6: a/p w/ cor · coronal · 0.91mm/px · 3 of 156 slices shown]
[im 52/156  soft-tissue]
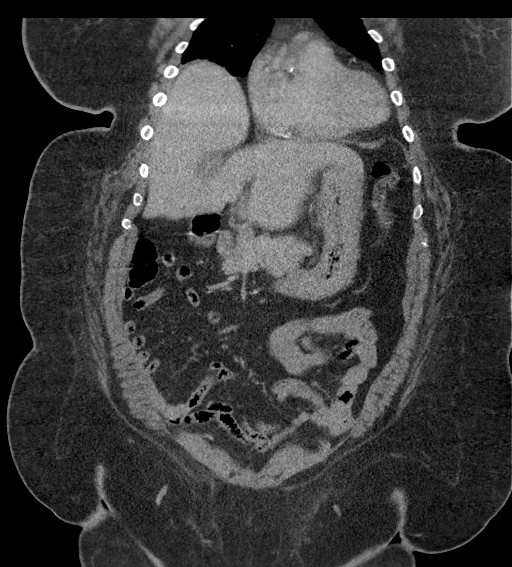
[im 69/156  soft-tissue]
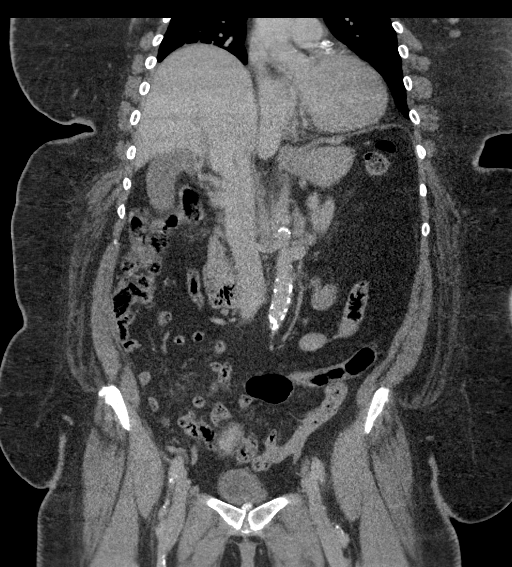
[im 87/156  soft-tissue]
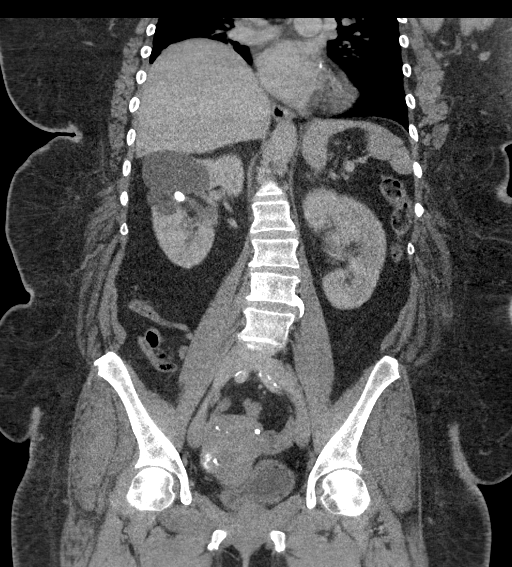

[16 of 46 positions shown; findings below may reference images not displayed]

FINDINGS: Lower chest: There is mild eventration of the right hemidiaphragm
with right lung base atelectasis. 3 vessel coronary vascular
calcification noted.

No intra-abdominal free air or free fluid.

Hepatobiliary: The liver is unremarkable. No intrahepatic biliary
ductal dilatation. Probable small gallbladder sludge. No calcified
gallstone or pericholecystic fluid.

Pancreas: Unremarkable. No pancreatic ductal dilatation or
surrounding inflammatory changes.

Spleen: Normal in size without focal abnormality.

Adrenals/Urinary Tract: The adrenal glands are unremarkable. There
is a lobulated and probable septated right renal upper pole cyst
measuring up to 6 x 6 cm. There is a 1 x 2 cm right renal interpolar
stone versus parenchymal calcification. There is probable cluster of
stone inferior to the right renal cyst. There is no hydronephrosis.
The left kidney is unremarkable. There is symmetric enhancement and
excretion of contrast by both kidneys. The visualized ureters appear
unremarkable. The urinary bladder is collapsed.

Stomach/Bowel: There is no bowel obstruction or active inflammation.
The appendix is normal.

Vascular/Lymphatic: Advanced aortoiliac atherosclerotic disease. The
IVC is unremarkable. No portal venous gas. There is no adenopathy.

Reproductive: The uterus is anteverted. Multiple partially calcified
fibroids noted. There is a 3.6 cm cystic structure in the left
ovary. The right ovary is unremarkable.

Other: Small fat containing umbilical hernia.

Musculoskeletal: Degenerative changes of the spine. No acute osseous
pathology.
IMPRESSION: 1. No acute intra-abdominal or pelvic pathology. No bowel
obstruction. Normal appendix.
2. Right renal complex/septated cyst. Further evaluation with
ultrasound on a nonemergent/outpatient basis recommended.
3. Nonobstructing right renal calculi versus parenchymal
calcification/scarring. No hydronephrosis.
4. A 3.6 cm left ovarian cystic structure. Further evaluation with
pelvic ultrasound on a nonemergent/outpatient basis recommended.
5. Aortic Atherosclerosis ([G0]-[G0]).

## 2019-06-04 MED ORDER — POLYETHYLENE GLYCOL 3350 17 GM/SCOOP PO POWD
17.0000 g | Freq: Every day | ORAL | 0 refills | Status: DC
Start: 2019-06-04 — End: 2020-03-04

## 2019-06-04 MED ORDER — POLYETHYLENE GLYCOL 3350 17 GM/SCOOP PO POWD
17.0000 g | Freq: Every day | ORAL | 0 refills | Status: DC
Start: 2019-06-04 — End: 2019-06-04

## 2019-06-04 MED ORDER — SODIUM CHLORIDE 0.9% FLUSH: 3.0000 mL | Freq: Once | INTRAVENOUS | Status: DC

## 2019-06-04 MED ORDER — NITROFURANTOIN MONOHYD MACRO 100 MG PO CAPS
100.0000 mg | ORAL_CAPSULE | Freq: Two times a day (BID) | ORAL | 0 refills | Status: DC
Start: 2019-06-04 — End: 2019-06-04

## 2019-06-04 MED ORDER — SODIUM CHLORIDE 0.9 % IV BOLUS
1000.0000 mL | Freq: Once | INTRAVENOUS | Status: AC
  Administered 2019-06-04: 1000 mL via INTRAVENOUS

## 2019-06-04 MED ORDER — IOHEXOL 300 MG/ML  SOLN
100.0000 mL | Freq: Once | INTRAMUSCULAR | Status: AC | PRN
  Administered 2019-06-04: 19:00:00 100 mL via INTRAVENOUS

## 2019-06-04 MED ORDER — NITROFURANTOIN MONOHYD MACRO 100 MG PO CAPS
100.0000 mg | ORAL_CAPSULE | Freq: Two times a day (BID) | ORAL | 0 refills | Status: DC
Start: 2019-06-04 — End: 2020-03-04

## 2019-06-04 NOTE — ED Triage Notes (Signed)
C/o nausea, vomiting, lower abd pain, constipation, and frequent urination since yesterday.  Pt states she has been having multiple problems over the last month.  Seen at [facility] yesterday.

## 2019-06-04 NOTE — Discharge Instructions (Signed)
Your lab work continues to look good.  There are no big differences from yesterday.  Your CT scan today has 2 findings which will need to be followed up by your primary care doctor.  You have a cyst on your right kidney. You also have what appears to be a cyst in the area of the left ovary.  You will likely need ultrasound of these areas to look at the more closely.  Please take the antibiotics for the urine infection diagnosed yesterday.  I wrote a prescription for Macrobid today to take instead of the cephalexin you were prescribed yesterday.  You may use MiraLAX prescribed for constipation and Zofran which was given to you yesterday for nausea and vomiting.  Please continue to drink plenty fluids.

## 2019-06-04 NOTE — ED Provider Notes (Signed)
[facility] EMERGENCY DEPARTMENT Provider Note   CSN: [id number] Arrival date & time: 06/04/19  1346     History Chief Complaint  Patient presents with  . Abdominal Pain    [name] is a 64 y.o. female.  Patient with history of diabetes, CHF, GERD (reported hiatal hernia), no previous abdominal surgeries --presents to the emergency department with worsening abdominal pain, constipation, urinary frequency, and dizziness.  Patient was seen at [facility] yesterday morning with multiple complaints.  She had a work-up which was largely reassuring.  She had a UA which showed some signs of infection and patient was started on Keflex.  She has taken 2 doses of this but it upset her stomach and she does not feel that she can tolerate it.  She had a urine culture sent which grew out multiple species.  Patient's main complaint today is that she feels more sick today.  She has more significant pain in the epigastrium as well as the lower abdomen.  She has nausea without any vomiting.  She states that she is constipated and is not passing gas.  No chest pain or shortness of breath.  She has some shaking chills last night after she was awoken from sleep with epigastric pain which she attributes to the antibiotic.  She denies having any fevers.  She states that she has felt generally unwell since receiving her second dose of the Moderna Covid vaccine earlier in the month.        Past Medical History:  Diagnosis Date  . Arthritis   . Asthma   . CHF (congestive heart failure) ([place])   . Coronary artery disease   . Diabetes mellitus   . Diabetic neuropathy ([place])   . GERD (gastroesophageal reflux disease)   . History of hiatal hernia   . Hx of cardiovascular stress test    Lexiscan Myoview (10/15):  Medium area of ischemia in AL and IL distribution, cannot completely exclude shifting breast attenuation, EF 64%; Abnormal study  . Hyperlipidemia   . Hypertension     . Morbid obesity ([place])   . Sleep apnea    does not wear CPAP    Patient Active Problem List   Diagnosis Date Noted  . Coronary atherosclerosis of native coronary artery 07/17/2013  . Mixed hyperlipidemia 07/17/2013  . Essential hypertension, benign 07/17/2013  . Obesity 08/11/2011    Past Surgical History:  Procedure Laterality Date  . [place]   right  . COLONOSCOPY    . DILATION AND CURETTAGE OF UTERUS    . HAND SURGERY     right  . heart stent  2007  . PARS PLANA VITRECTOMY Left 03/24/2017   Procedure: PARS PLANA VITRECTOMY WITH 25 GAUGE;  Surgeon: Hurman Horn, MD;  Location: [facility];  Service: Ophthalmology;  Laterality: Left;  . UTERINE FIBROID SURGERY  2008     OB History   No obstetric history on file.     Family History  Problem Relation Age of Onset  . Kidney disease Father   . Stroke Father   . Heart attack Father   . Stroke Maternal Grandfather   . Stroke Paternal Grandfather     Social History   Tobacco Use  . Smoking status: Former Smoker    Types: Cigarettes    Quit date: 08/07/1982    Years since quitting: 36.8  . Smokeless tobacco: Never Used  Substance Use Topics  . Alcohol use:  No  . Drug use: No    Home Medications Prior to Admission medications   Medication Sig Start Date End Date Taking? Authorizing Provider  albuterol (PROVENTIL HFA;VENTOLIN HFA) 108 (90 Base) MCG/ACT inhaler Inhale 1-2 puffs into the lungs every 6 (six) hours as needed for wheezing or shortness of breath. 05/24/17   Long, Wonda Olds, MD  amLODipine (NORVASC) 10 MG tablet Take 1 tablet (10 mg total) by mouth daily. 03/03/19   Jettie Booze, MD  aspirin EC 81 MG tablet Take 1 tablet (81 mg total) by mouth daily. 03/03/19   Jettie Booze, MD  BREO ELLIPTA 100-25 MCG/INH AEPB Inhale 1 puff into the lungs daily. 09/21/16   [provider]  carvedilol (COREG) 6.25 MG tablet Take 1 tablet (6.25 mg total) by mouth 2 (two) times daily.  03/03/19   Jettie Booze, MD  cephALEXin (KEFLEX) 500 MG capsule Take 1 capsule (500 mg total) by mouth 2 (two) times daily for 7 days. 06/03/19 06/10/19  Gareth Morgan, MD  clopidogrel (PLAVIX) 75 MG tablet Take 1 tablet (75 mg total) by mouth daily. 03/03/19   Jettie Booze, MD  Coenzyme Q10 (COQ10) 400 MG CAPS Take 400 mg by mouth daily.    [provider]  dexlansoprazole (DEXILANT) 60 MG capsule Take 1 capsule (60 mg total) by mouth daily. 03/03/19   Jettie Booze, MD  Dulaglutide (TRULICITY) [id number] [id number] SOPN Inject 0.75 mg into the skin every Tuesday.    [provider]  FREESTYLE LITE test strip 1 each by Other route as directed.  07/08/11   [provider]  furosemide (LASIX) 40 MG tablet Take 2 tablets (80 mg total) by mouth daily. 03/03/19   Jettie Booze, MD  HUMULIN R 500 UNIT/ML SOLN injection Inject 80-110 Units into the skin 2 (two) times daily with a meal. 110 units in the morning and 80 units in the evening 12/25/13   [provider]  lisinopril (ZESTRIL) 40 MG tablet Take 1 tablet (40 mg total) by mouth daily. 03/03/19   Jettie Booze, MD  nitroGLYCERIN (NITROSTAT) 0.4 MG SL tablet Place 1 tablet (0.4 mg total) under the tongue every 5 (five) minutes as needed for chest pain (X 3 DOSES FOR CHEST PAIN). 11/30/17   Jettie Booze, MD  ondansetron (ZOFRAN ODT) 4 MG disintegrating tablet Take 1 tablet (4 mg total) by mouth every 8 (eight) hours as needed for nausea or vomiting. 06/03/19   Gareth Morgan, MD  potassium chloride (KLOR-CON) 10 MEQ tablet Take 1 tablet (10 mEq total) by mouth 2 (two) times daily. 03/03/19   Jettie Booze, MD  rosuvastatin (CRESTOR) 40 MG tablet Take 1 tablet (40 mg total) by mouth daily. 03/03/19   Jettie Booze, MD    Allergies    Bystolic [nebivolol hcl], Crestor [rosuvastatin], Sulfa antibiotics, Metformin and related, and Other  Review of Systems   Review of  Systems  Constitutional: Positive for chills. Negative for fever.  HENT: Positive for trouble swallowing (gagging with pills). Negative for rhinorrhea and sore throat.   Eyes: Negative for redness.  Respiratory: Negative for cough and shortness of breath.   Cardiovascular: Negative for chest pain.  Gastrointestinal: Positive for abdominal pain, constipation and nausea. Negative for diarrhea and vomiting.  Genitourinary: Positive for frequency. Negative for decreased urine volume and dysuria.  Musculoskeletal: Negative for myalgias.  Skin: Negative for rash.  Neurological: Positive for dizziness (lightheadedness) and light-headedness. Negative for headaches.  Physical Exam Updated Vital Signs BP (!) 180/70 (BP Location: Left Arm)   Pulse 93   Temp 98.5 F (36.9 C) (Oral)   Resp 16   LMP  (LMP Unknown)   SpO2 96%   Physical Exam Vitals and nursing note reviewed.  Constitutional:      Appearance: She is well-developed.  HENT:     Head: Normocephalic and atraumatic.  Eyes:     General:        Right eye: No discharge.        Left eye: No discharge.     Conjunctiva/sclera: Conjunctivae normal.  Cardiovascular:     Rate and Rhythm: Normal rate and regular rhythm.     Heart sounds: Normal heart sounds.  Pulmonary:     Effort: Pulmonary effort is normal.     Breath sounds: Normal breath sounds.  Abdominal:     Palpations: Abdomen is soft.     Tenderness: There is abdominal tenderness in the epigastric area and suprapubic area. There is no guarding or rebound.  Musculoskeletal:     Cervical back: Normal range of motion and neck supple.  Skin:    General: Skin is warm and dry.  Neurological:     Mental Status: She is alert.  Psychiatric:        Mood and Affect: Mood is anxious.     ED Results / Procedures / Treatments   Labs (all labs ordered are listed, but only abnormal results are displayed) Labs Reviewed  COMPREHENSIVE METABOLIC PANEL - Abnormal; Notable for the  following components:      Result Value   Potassium 3.4 (*)    Glucose, Bld 134 (*)    BUN 7 (*)    All other components within normal limits  CBC - Abnormal; Notable for the following components:   RBC 5.12 (*)    All other components within normal limits  URINE CULTURE  LIPASE, BLOOD  URINALYSIS, ROUTINE W REFLEX MICROSCOPIC  CBG MONITORING, ED    EKG None  Radiology CT ABDOMEN PELVIS W CONTRAST  Result Date: 06/04/2019 CLINICAL DATA:  64 year old female with abdominal pain. EXAM: CT ABDOMEN AND PELVIS WITH CONTRAST TECHNIQUE: Multidetector CT imaging of the abdomen and pelvis was performed using the standard protocol following bolus administration of intravenous contrast. CONTRAST:  171mL OMNIPAQUE IOHEXOL 300 MG/ML  SOLN COMPARISON:  None. FINDINGS: Lower chest: There is mild eventration of the right hemidiaphragm with right lung base atelectasis. 3 vessel coronary vascular calcification noted. No intra-abdominal free air or free fluid. Hepatobiliary: The liver is unremarkable. No intrahepatic biliary ductal dilatation. Probable small gallbladder sludge. No calcified gallstone or pericholecystic fluid. Pancreas: Unremarkable. No pancreatic ductal dilatation or surrounding inflammatory changes. Spleen: Normal in size without focal abnormality. Adrenals/Urinary Tract: The adrenal glands are unremarkable. There is a lobulated and probable septated right renal upper pole cyst measuring up to 6 x 6 cm. There is a 1 x 2 cm right renal interpolar stone versus parenchymal calcification. There is probable cluster of stone inferior to the right renal cyst. There is no hydronephrosis. The left kidney is unremarkable. There is symmetric enhancement and excretion of contrast by both kidneys. The visualized ureters appear unremarkable. The urinary bladder is collapsed. Stomach/Bowel: There is no bowel obstruction or active inflammation. The appendix is normal. Vascular/Lymphatic: Advanced aortoiliac  atherosclerotic disease. The IVC is unremarkable. No portal venous gas. There is no adenopathy. Reproductive: The uterus is anteverted. Multiple partially calcified fibroids noted. There is a 3.6 cm  cystic structure in the left ovary. The right ovary is unremarkable. Other: Small fat containing umbilical hernia. Musculoskeletal: Degenerative changes of the spine. No acute osseous pathology. IMPRESSION: 1. No acute intra-abdominal or pelvic pathology. No bowel obstruction. Normal appendix. 2. Right renal complex/septated cyst. Further evaluation with ultrasound on a nonemergent/outpatient basis recommended. 3. Nonobstructing right renal calculi versus parenchymal calcification/scarring. No hydronephrosis. 4. A 3.6 cm left ovarian cystic structure. Further evaluation with pelvic ultrasound on a nonemergent/outpatient basis recommended. 5. Aortic Atherosclerosis (ICD10-I70.0). Electronically Signed   By: Anner Crete M.D.   On: 06/04/2019 19:40    Procedures Procedures (including critical care time)  Medications Ordered in ED Medications  sodium chloride flush (NS) 0.9 % injection 3 mL (has no administration in time range)  sodium chloride 0.9 % bolus 1,000 mL (1,000 mLs Intravenous Bolus from Bag 06/04/19 1946)  iohexol (OMNIPAQUE) 300 MG/ML solution 100 mL (100 mLs Intravenous Contrast Given 06/04/19 1919)    ED Course  I have reviewed the triage vital signs and the nursing notes.  Pertinent labs & imaging results that were available during my care of the patient were reviewed by me and considered in my medical decision making (see chart for details).  Patient seen and examined.  Reviewed work-up and plan from yesterday. Reviewed EKG which was normal.  Patient's main complaint is worsening of abdominal pain as stated yesterday.  She does not have any signs of peritonitis on exam and lab work-up remains reassuring, however given her reported worsening and repeat emergency department visit discussed  obtaining CT imaging with the patient, its limitations and advantages.  She would like to proceed.  Vital signs reviewed and are as follows: BP (!) 180/70 (BP Location: Left Arm)   Pulse 93   Temp 98.5 F (36.9 C) (Oral)   Resp 16   LMP  (LMP Unknown)   SpO2 96%   CT imaging reviewed personally.  Patient with no life-threatening or emergent findings.  She does have a right renal cyst and a left ovarian cyst which will need to be followed-up as an outpatient, likely with ultrasonography.  As far as symptom control at this time, will change antibiotic from Keflex to Macrobid to see if she tolerates this better.  Will give MiraLAX for constipation.  Patient encouraged to continue using Zofran for nausea and to maintain good fluid intake.  She states that she will call her primary care doctor tomorrow for follow-up.  The patient was urged to return to the Emergency Department immediately with worsening of current symptoms, worsening abdominal pain, persistent vomiting, blood noted in stools, fever, or any other concerns. The patient verbalized understanding.    8:54 PM patient rechecked.  Blood sugar in the 80s.  She is drinking Coke in the room.  Will finish IV fluids and collect UA.  Patient will be discharged when she provides a urine sample.  Rona Ravens PA-C updated on plan and will help facilitate discharge.    MDM Rules/Calculators/A&P                      Patient with ongoing abdominal pain as well as multiple other nonacute symptoms.  Vital signs here show hypertension without other concerns.  Her lab work-up is very reassuring.  CT ordered tonight given persistent symptoms with findings as above.  No acute or life-threatening issues identified.  Patient will need primary care follow-up as notated above.    Final Clinical Impression(s) / ED Diagnoses Final diagnoses:  Lower abdominal pain  Acute cystitis without hematuria  Nausea  Renal cyst  Cyst of left ovary    Rx / DC  Orders ED Discharge Orders         Ordered    nitrofurantoin, macrocrystal-monohydrate, (MACROBID) 100 MG capsule  2 times daily     06/04/19 2048    polyethylene glycol powder (GLYCOLAX/MIRALAX) 17 GM/SCOOP powder  Daily     06/04/19 2053           Carlisle Cater, PA-C 06/04/19 2054    Hayden Rasmussen, MD 06/04/19 2201

## 2019-06-04 NOTE — ED Notes (Signed)
Pt. Successfully passed fluid challenge.

## 2019-06-04 NOTE — ED Notes (Signed)
Provided pt with water for fluid challenge

## 2019-06-06 LAB — URINE CULTURE: Culture: 70000 — AB

## 2019-06-07 ENCOUNTER — Telehealth: Payer: Self-pay

## 2019-06-07 NOTE — Progress Notes (Signed)
ED Antimicrobial Stewardship Positive Culture Follow Up   [name] is an 65 y.o. female who presented to [facility] on 06/04/2019 with a chief complaint of abdominal pain, constipation, urinary frequency, dizziness, chills, and thought to have a UTI.  Chief Complaint  Patient presents with  . Abdominal Pain    Recent Results (from the past 720 hour(s))  Urine culture     Status: Abnormal   Collection Time: 06/03/19  9:32 AM   Specimen: Urine, Random  Result Value Ref Range Status   Specimen Description   Final    URINE, RANDOM Performed at [facility], [address] [place], [place] [address]    Special Requests   Final    NONE Performed at [facility], [address] [place], [place] [address]    Culture MULTIPLE SPECIES PRESENT, SUGGEST RECOLLECTION (A)  Final   Report Status 06/04/2019 FINAL  Final  Urine Culture     Status: Abnormal   Collection Time: 06/04/19 10:19 PM   Specimen: Urine, Random  Result Value Ref Range Status   Specimen Description URINE, RANDOM  Final   Special Requests   Final    NONE Performed at [facility], [address], [place], [place] [address]    Culture 70,000 COLONIES/mL [facility] MORGANII (A)  Final   Report Status 06/06/2019 FINAL  Final   Organism ID, Bacteria MORGANELLA MORGANII (A)  Final      Susceptibility   Morganella morganii - MIC*    AMPICILLIN >=32 RESISTANT Resistant     CEFAZOLIN >=64 RESISTANT Resistant     CEFTRIAXONE <=1 SENSITIVE Sensitive     CIPROFLOXACIN <=0.25 SENSITIVE Sensitive     GENTAMICIN <=1 SENSITIVE Sensitive     IMIPENEM 2 SENSITIVE Sensitive     NITROFURANTOIN RESISTANT Resistant     TRIMETH/SULFA <=20 SENSITIVE Sensitive     AMPICILLIN/SULBACTAM >=32 RESISTANT Resistant     PIP/TAZO <=4 SENSITIVE Sensitive     * 70,000 COLONIES/mL MORGANELLA MORGANII    [x]  Treated with nitrofurantoin, organism resistant to prescribed antimicrobial  New antibiotic  prescription: levofloxacin 250 mg tablets (quantity of 3 tablets). Take 1 tablet by mouth daily for 3 days   ED Provider: Rod Can, PharmD PGY1 Acute Care Pharmacy Resident 06/07/2019, 9:14 AM Monday - Friday phone -  [phone] Saturday - Sunday phone - [phone]

## 2019-06-07 NOTE — Telephone Encounter (Signed)
Post ED Visit - Positive Culture Follow-up: Unsuccessful Patient Follow-up  Culture assessed and recommendations reviewed by:  []  Elenor Quinones, Pharm.D. []  Heide Guile, Pharm.D., BCPS AQ-ID []  Parks Neptune, Pharm.D., BCPS []  Alycia Rossetti, Pharm.D., BCPS []  Emma, [place].D., BCPS, AAHIVP []  Legrand Como, Pharm.D., BCPS, AAHIVP []  Wynell Balloon, PharmD []  Vincenza Hews, PharmD, BCPS Kushul Hillery Aldo Pharm D Positive urine culture Myrtie Hawk Levoquin 250 mg daily x 3 days []  Patient discharged without antimicrobial prescription and treatment is now indicated [x]  Organism is resistant to prescribed ED discharge antimicrobial []  Patient with positive blood cultures   Unable to contact patient after 3 attempts, letter will be sent to address on file  Genia Del 06/07/2019, 9:20 AM

## 2019-07-19 ENCOUNTER — Encounter: Payer: 59 | Attending: Internal Medicine | Admitting: Registered"

## 2019-07-19 ENCOUNTER — Other Ambulatory Visit: Payer: Self-pay

## 2019-07-19 ENCOUNTER — Encounter: Payer: Self-pay | Admitting: Registered"

## 2019-07-19 DIAGNOSIS — E119 Type 2 diabetes mellitus without complications: Secondary | ICD-10-CM | POA: Diagnosis present

## 2019-07-19 NOTE — Patient Instructions (Signed)
Goals:  Follow Diabetes Meal Plan as instructed  Eat 3 meals and 2 snacks, every 3-5 hrs  Aim to eat breakfast within 1-2 hours of waking up  Aim to have 1/2 plate non-starchy vegetables + 1/4 plate starch/grain + 1/4 plate protein for lunch and dinner  Limit carbohydrate intake to 30-45 grams carbohydrate/meal  Limit carbohydrate intake to 15-30 grams carbohydrate/snack  Add lean protein foods to meals/snacks  Monitor glucose levels as instructed by your doctor  Aim for 30 mins of physical activity daily  Bring food record and glucose log to your next nutrition visit

## 2019-07-19 NOTE — Progress Notes (Signed)
Diabetes Self-Management Education  Visit Type:  First/Initial  Appt. Start Time: 9:47 Appt. End Time: 11:16  07/19/2019  [name], identified by name and date of birth, is a 64 y.o. female with a diagnosis of Diabetes: Type 2.   ASSESSMENT  Pt expectations: knowing what to eat and when to eat  Pt arrives with friend. Per referral, recent A1c was 7.8 (05/2019). States her numbers have been all over the place. States it dropped this morning at 4 am to 52. States ate a KIND bar, granola bar, banana, and consumed 1/2 c coconut milk to help bring BS up. States about 2 hours later it was 199. Reports normally her numbers are up. Was checking BS 3x/day: FBS (200-266), after work (240), and at bedtime (240-260). States she was told to check 2x/day and will follow suit.   States she lives to watch her diabetes and that's not a life.  States she has been having low BS since March 2021. Also in March, pt's mom (whom she was living with) became sick and went to the hospital. Mom did not return home. Passed away in 2019/06/28. Pt states she moved in with sister in March 2021, after mom went to hospital, because she couldn't see well. States she just moved back home, now living along, at the end of 2019-06-28. States she noticed she would wake up shaking. States she was caretaking for rmom and wouldn't take care of self.   States its a job to eat vegetables, protein, and carbohydrates with each meal. States food is not a big deal to her. States breakfast is a challenge to her. States she can cook but has lost all of that. Reports she is used to being the one to take care of everyone else.   Went to counseling prior to mom passing away for about 1 year. Was seeing weekly. Stopped in April. Plans to get reconnected.    There were no vitals taken for this visit. There is no height or weight on file to calculate BMI.   Diabetes Self-Management Education - 07/19/19 0957      Initial Visit   What type  of meal plan do you follow?  low sodium      Health Coping   How would you rate your overall health?  Fair      Psychosocial Assessment   Patient Belief/Attitude about Diabetes  Motivated to manage diabetes   defeated/burnout   Self-care barriers  Impaired vision    Self-management support  Friends    Patient Concerns  Nutrition/Meal planning    Special Needs  None    Preferred Learning Style  No preference indicated    Learning Readiness  Ready      Complications   Last HgB A1C per patient/outside source  7.8 %    How often do you check your blood sugar?  3-4 times/day    Fasting Blood glucose range (mg/dL)  >200    Postprandial Blood glucose range (mg/dL)  >200    Number of hypoglycemic episodes per month  4    Can you tell when your blood sugar is low?  Yes    What do you do if your blood sugar is low?  granola bars, fruit, cocounut milk or    Number of hyperglycemic episodes per week  0    Have you had a dilated eye exam in the past 12 months?  Yes    Have you had a dental exam in  the past 12 months?  No    Are you checking your feet?  Yes    How many days per week are you checking your feet?  3      Dietary Intake   Breakfast  6 grapes + waffle + PB + cocunut milk + 20 mini shredded wheat or cup of applesauce + 2 KIND bars + 6 grapes    Lunch  mac and cheese (2 oz) + 3 oz baked pork chop + 1/2 c green beans    Dinner  Contractor - 1 chicken leg + 1/2 c cream potatoes + 1/2 c rice pudding + diet mountain dew    Snack (evening)  1 c vegetable soup + 6 Ritz crackers    Beverage(s)  diet mountain dew, water (2.5*16 oz; 40 oz)      Exercise   Exercise Type  Light (walking / raking leaves)    How many days per week to you exercise?  6    How many minutes per day do you exercise?  60    Total minutes per week of exercise  360      Patient Education   Previous Diabetes Education  Yes (please comment)   years ago   Disease state   Definition of diabetes, type 1 and 2, and  the diagnosis of diabetes;Factors that contribute to the development of diabetes    Nutrition management   Role of diet in the treatment of diabetes and the relationship between the three main macronutrients and blood glucose level;Food label reading, portion sizes and measuring food.;Reviewed blood glucose goals for pre and post meals and how to evaluate the patients' food intake on their blood glucose level.;Meal options for control of blood glucose level and chronic complications.    Physical activity and exercise   Role of exercise on diabetes management, blood pressure control and cardiac health.    Monitoring  Purpose and frequency of SMBG.;Taught/discussed recording of test results and interpretation of SMBG.;Interpreting lab values - A1C, lipid, urine microalbumina.;Identified appropriate SMBG and/or A1C goals.    Acute complications  Taught treatment of hypoglycemia - the 15 rule.;Discussed and identified patients' treatment of hyperglycemia.    Chronic complications  Relationship between chronic complications and blood glucose control;Lipid levels, blood glucose control and heart disease;Identified and discussed with patient  current chronic complications;Reviewed with patient heart disease, higher risk of, and prevention    Psychosocial adjustment  Role of stress on diabetes      Individualized Goals (developed by patient)   Physical Activity  Exercise 5-7 days per week;30 minutes per day    Medications  take my medication as prescribed    Monitoring   test my blood glucose as discussed    Reducing Risk  increase portions of nuts and seeds;treat hypoglycemia with 15 grams of carbs if blood glucose less than 76m/dL;increase portions of healthy fats;examine blood glucose patterns      Post-Education Assessment   Patient understands the diabetes disease and treatment process.  Demonstrates understanding / competency    Patient understands incorporating nutritional management into lifestyle.   Demonstrates understanding / competency    Patient undertands incorporating physical activity into lifestyle.  Demonstrates understanding / competency    Patient understands using medications safely.  Demonstrates understanding / competency    Patient understands monitoring blood glucose, interpreting and using results  Needs Review    Patient understands prevention, detection, and treatment of acute complications.  Demonstrates understanding / competency  Patient understands prevention, detection, and treatment of chronic complications.  Demonstrates understanding / competency    Patient understands how to develop strategies to address psychosocial issues.  Demonstrates understanding / competency    Patient understands how to develop strategies to promote health/change behavior.  Needs Review      Outcomes   Program Status  Not Completed       Learning Objective:  Patient will have a greater understanding of diabetes self-management. Patient education plan is to attend individual and/or group sessions per assessed needs and concerns.   Plan:   Patient Instructions  Goals:  Follow Diabetes Meal Plan as instructed  Eat 3 meals and 2 snacks, every 3-5 hrs  Aim to eat breakfast within 1-2 hours of waking up  Aim to have 1/2 plate non-starchy vegetables + 1/4 plate starch/grain + 1/4 plate protein for lunch and dinner  Limit carbohydrate intake to 30-45 grams carbohydrate/meal  Limit carbohydrate intake to 15-30 grams carbohydrate/snack  Add lean protein foods to meals/snacks  Monitor glucose levels as instructed by your doctor  Aim for 30 mins of physical activity daily  Bring food record and glucose log to your next nutrition visit     Expected Outcomes:  Demonstrated interest in learning. Expect positive outcomes  Education material provided: ADA - How to Thrive: A Guide for Your Journey with Diabetes  If problems or questions, patient to contact team via:  Phone  and Email  Future DSME appointment: - 4-6 wks

## 2019-08-23 ENCOUNTER — Ambulatory Visit: Payer: 59 | Admitting: Registered"

## 2020-01-11 ENCOUNTER — Ambulatory Visit
Admission: EM | Admit: 2020-01-11 | Discharge: 2020-01-11 | Disposition: A | Discharge status: Home / Self Care | Payer: 59 | Attending: Emergency Medicine | Admitting: Emergency Medicine

## 2020-01-11 ENCOUNTER — Other Ambulatory Visit: Payer: Self-pay

## 2020-01-11 DIAGNOSIS — Z1152 Encounter for screening for COVID-19: Secondary | ICD-10-CM

## 2020-01-11 DIAGNOSIS — R0789 Other chest pain: Secondary | ICD-10-CM

## 2020-01-11 DIAGNOSIS — J4521 Mild intermittent asthma with (acute) exacerbation: Secondary | ICD-10-CM

## 2020-01-11 DIAGNOSIS — R059 Cough, unspecified: Secondary | ICD-10-CM | POA: Diagnosis not present

## 2020-01-11 DIAGNOSIS — R062 Wheezing: Secondary | ICD-10-CM

## 2020-01-11 MED ORDER — PREDNISONE 20 MG PO TABS: 20.0000 mg | ORAL_TABLET | Freq: Two times a day (BID) | ORAL | 0 refills | Status: AC

## 2020-01-11 MED ORDER — DEXAMETHASONE SODIUM PHOSPHATE 10 MG/ML IJ SOLN
10.0000 mg | Freq: Once | INTRAMUSCULAR | Status: AC
  Administered 2020-01-11: 10 mg via INTRAMUSCULAR

## 2020-01-11 NOTE — ED Provider Notes (Signed)
[facility]   [id number] 01/11/20 Arrival Time: 5701   CC: COVID symptoms  SUBJECTIVE: History from: patient.  [name] is a 64 y.o. female who presents with sore throat and cough x 4 days ago, now having chest tightness and wheezing x 1-2 days.  Hx significant for asthma.  Reports similar symptoms in the past with asthma flare.  Reports relatives with similar symptoms, but denies sick exposure to COVID, flu or strep.  Has tried OTC medications without relief.  Symptoms are made worse with activity.  Denies fever, chills, sinus pain, SOB, chest pain, nausea, changes in bowel or bladder habits.    ROS: As per HPI.  All other pertinent ROS negative.     Past Medical History:  Diagnosis Date  . Arthritis   . Asthma   . CHF (congestive heart failure) ([place])   . Coronary artery disease   . Diabetes mellitus   . Diabetic neuropathy ([place])   . GERD (gastroesophageal reflux disease)   . History of hiatal hernia   . Hx of cardiovascular stress test    Lexiscan Myoview (10/15):  Medium area of ischemia in AL and IL distribution, cannot completely exclude shifting breast attenuation, EF 64%; Abnormal study  . Hyperlipidemia   . Hypertension   . Morbid obesity ([place])   . Sleep apnea    does not wear CPAP   Past Surgical History:  Procedure Laterality Date  . [place]   right  . COLONOSCOPY    . DILATION AND CURETTAGE OF UTERUS    . HAND SURGERY     right  . heart stent  2007  . PARS PLANA VITRECTOMY Left 03/24/2017   Procedure: PARS PLANA VITRECTOMY WITH 25 GAUGE;  Surgeon: Hurman Horn, MD;  Location: [facility];  Service: Ophthalmology;  Laterality: Left;  . UTERINE FIBROID SURGERY  2008   Allergies  Allergen Reactions  . Bystolic [Nebivolol Hcl] Palpitations  . Crestor [Rosuvastatin] Other (See Comments)    Couldn't tolerate Crestor 40 mg dose   . Sulfa Antibiotics Anaphylaxis  . Metformin And Related Diarrhea  . Other     Ok [place] anything  that has this in it makes her wheeze   No current facility-administered medications on file prior to encounter.   Current Outpatient Medications on File Prior to Encounter  Medication Sig Dispense Refill  . albuterol (PROVENTIL HFA;VENTOLIN HFA) 108 (90 Base) MCG/ACT inhaler Inhale 1-2 puffs into the lungs every 6 (six) hours as needed for wheezing or shortness of breath. 1 Inhaler 0  . amLODipine (NORVASC) 10 MG tablet Take 1 tablet (10 mg total) by mouth daily. 90 tablet 3  . aspirin EC 81 MG tablet Take 1 tablet (81 mg total) by mouth daily. 90 tablet 3  . BREO ELLIPTA 100-25 MCG/INH AEPB Inhale 1 puff into the lungs daily.    . carvedilol (COREG) 6.25 MG tablet Take 1 tablet (6.25 mg total) by mouth 2 (two) times daily. 180 tablet 3  . clopidogrel (PLAVIX) 75 MG tablet Take 1 tablet (75 mg total) by mouth daily. 90 tablet 3  . Coenzyme Q10 (COQ10) 400 MG CAPS Take 400 mg by mouth daily.    Marland Kitchen dexlansoprazole (DEXILANT) 60 MG capsule Take 1 capsule (60 mg total) by mouth daily. 90 capsule 3  . Dulaglutide (TRULICITY) 7.79 TJ/0.3ES SOPN Inject 0.75 mg into the skin every Tuesday.    Marland Kitchen FREESTYLE LITE test strip 1 each by Other route as directed.     Marland Kitchen  furosemide (LASIX) 40 MG tablet Take 2 tablets (80 mg total) by mouth daily. 180 tablet 3  . HUMULIN R 500 UNIT/ML SOLN injection Inject 80-110 Units into the skin 2 (two) times daily with a meal. 110 units in the morning and 80 units in the evening    . lisinopril (ZESTRIL) 40 MG tablet Take 1 tablet (40 mg total) by mouth daily. 90 tablet 3  . nitrofurantoin, macrocrystal-monohydrate, (MACROBID) 100 MG capsule Take 1 capsule (100 mg total) by mouth 2 (two) times daily. 10 capsule 0  . nitroGLYCERIN (NITROSTAT) 0.4 MG SL tablet Place 1 tablet (0.4 mg total) under the tongue every 5 (five) minutes as needed for chest pain (X 3 DOSES FOR CHEST PAIN). 25 tablet 3  . ondansetron (ZOFRAN ODT) 4 MG disintegrating tablet Take 1 tablet (4 mg total) by  mouth every 8 (eight) hours as needed for nausea or vomiting. 20 tablet 0  . polyethylene glycol powder (GLYCOLAX/MIRALAX) 17 GM/SCOOP powder Take 17 g by mouth daily. 255 g 0  . potassium chloride (KLOR-CON) 10 MEQ tablet Take 1 tablet (10 mEq total) by mouth 2 (two) times daily. 180 tablet 3  . rosuvastatin (CRESTOR) 40 MG tablet Take 1 tablet (40 mg total) by mouth daily. 90 tablet 3   Social History   Socioeconomic History  . Marital status: Single    Spouse name: Not on file  . Number of children: Not on file  . Years of education: Not on file  . Highest education level: Not on file  Occupational History  . Not on file  Tobacco Use  . Smoking status: Former Smoker    Types: Cigarettes    Quit date: 08/07/1982    Years since quitting: 37.4  . Smokeless tobacco: Never Used  Vaping Use  . Vaping Use: Never used  Substance and Sexual Activity  . Alcohol use: No  . Drug use: No  . Sexual activity: Not on file  Other Topics Concern  . Not on file  Social History Narrative  . Not on file   Social Determinants of Health   Financial Resource Strain:   . Difficulty of Paying Living Expenses: Not on file  Food Insecurity:   . Worried About Charity fundraiser in the Last Year: Not on file  . Ran Out of Food in the Last Year: Not on file  Transportation Needs:   . Lack of Transportation (Medical): Not on file  . Lack of Transportation (Non-Medical): Not on file  Physical Activity:   . Days of Exercise per Week: Not on file  . Minutes of Exercise per Session: Not on file  Stress:   . Feeling of Stress : Not on file  Social Connections:   . Frequency of Communication with Friends and Family: Not on file  . Frequency of Social Gatherings with Friends and Family: Not on file  . Attends Religious Services: Not on file  . Active Member of Clubs or Organizations: Not on file  . Attends Archivist Meetings: Not on file  . Marital Status: Not on file  Intimate Partner  Violence:   . Fear of Current or Ex-Partner: Not on file  . Emotionally Abused: Not on file  . Physically Abused: Not on file  . Sexually Abused: Not on file   Family History  Problem Relation Age of Onset  . Kidney disease Father   . Stroke Father   . Heart attack Father   . Stroke Maternal Grandfather   .  Stroke Paternal Grandfather   . Asthma Other   . Hypertension Other   . Diabetes Other     OBJECTIVE:  Vitals:   01/11/20 1734  BP: (!) 165/87  Pulse: 80  Resp: 20  Temp: 98.3 F (36.8 C)  SpO2: 94%     General appearance: alert; mildly fatigued appearin, nontoxic; speaking in full sentences and tolerating own secretions HEENT: NCAT; Ears: EACs clear, TMs pearly gray; Eyes: PERRL.  EOM grossly intact.Nose: nares patent without rhinorrhea, Throat: oropharynx clear, tonsils non erythematous or enlarged, uvula midline  Neck: supple without LAD Lungs: unlabored respirations, symmetrical air entry; cough: absent; no respiratory distress; CTAB Heart: regular rate and rhythm.  Skin: warm and dry Psychological: alert and cooperative; normal mood and affect   ASSESSMENT & PLAN:  1. Encounter for screening for COVID-19   2. Cough   3. Chest tightness   4. Wheezing     Meds ordered this encounter  Medications  . predniSONE (DELTASONE) 20 MG tablet    Sig: Take 1 tablet (20 mg total) by mouth 2 (two) times daily with a meal for 5 days.    Dispense:  10 tablet    Refill:  0    Order Specific Question:   Supervising Provider    Answer:   Raylene Everts [[id number]]  . dexamethasone (DECADRON) injection 10 mg   Will cover for asthma flare secondary to cold symptoms Steroid shot given in office Prednisone prescribed. Take as directed and to completion Declines covid test at this time. Would like to go to pharmacy to have a rapid test Get plenty of rest and push fluids Use OTC zyrtec for nasal congestion, runny nose, and/or sore throat Use OTC flonase for nasal  congestion and runny nose Use medications daily for symptom relief Use OTC medications like ibuprofen or tylenol as needed fever or pain Call or go to the ED if you have any new or worsening symptoms such as fever, worsening cough, shortness of breath, chest tightness, chest pain, turning blue, changes in mental status, etc...   Reviewed expectations re: course of current medical issues. Questions answered. Outlined signs and symptoms indicating need for more acute intervention. Patient verbalized understanding. After Visit Summary given.         Lestine Box, PA-C 01/11/20 1830

## 2020-01-11 NOTE — Discharge Instructions (Signed)
Steroid shot given in office Prednisone prescribed. Take as directed and to completion Declines covid test at this time. Would like to go to pharmacy to have a rapid test Get plenty of rest and push fluids Use OTC zyrtec for nasal congestion, runny nose, and/or sore throat Use OTC flonase for nasal congestion and runny nose Use medications daily for symptom relief Use OTC medications like ibuprofen or tylenol as needed fever or pain Call or go to the ED if you have any new or worsening symptoms such as fever, worsening cough, shortness of breath, chest tightness, chest pain, turning blue, changes in mental status, etc..Marland Kitchen

## 2020-01-11 NOTE — ED Triage Notes (Signed)
Pt states she had uri symptoms and then developed sob and asthma flare
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
## 2020-03-04 ENCOUNTER — Other Ambulatory Visit: Payer: Self-pay | Admitting: Interventional Cardiology

## 2020-03-04 ENCOUNTER — Encounter: Payer: Self-pay | Admitting: Interventional Cardiology

## 2020-03-04 ENCOUNTER — Ambulatory Visit: Payer: 59 | Admitting: Interventional Cardiology

## 2020-03-04 ENCOUNTER — Other Ambulatory Visit: Payer: Self-pay

## 2020-03-04 VITALS — BP 150/92 | HR 88 | Ht 62.0 in | Wt 294.0 lb

## 2020-03-04 DIAGNOSIS — I10 Essential (primary) hypertension: Secondary | ICD-10-CM | POA: Diagnosis not present

## 2020-03-04 DIAGNOSIS — I25119 Atherosclerotic heart disease of native coronary artery with unspecified angina pectoris: Secondary | ICD-10-CM | POA: Diagnosis not present

## 2020-03-04 DIAGNOSIS — E782 Mixed hyperlipidemia: Secondary | ICD-10-CM

## 2020-03-04 DIAGNOSIS — E1159 Type 2 diabetes mellitus with other circulatory complications: Secondary | ICD-10-CM

## 2020-03-04 NOTE — Patient Instructions (Signed)
Medication Instructions:  Your physician has recommended you make the following change in your medication:  START:   lasix 80mg  in the morning      Potassium 20mg  in the morning *If you need a refill on your cardiac medications before your next appointment, please call your pharmacy*   Lab Work: BMET and BNP in one week  If you have labs (blood work) drawn today and your tests are completely normal, you will receive your results only by: Marland Kitchen MyChart Message (if you have MyChart) OR . A paper copy in the mail If you have any lab test that is abnormal or we need to change your treatment, we will call you to review the results.   Testing/Procedures: NONE   Follow-Up: At [facility], you and your health needs are our priority.  As part of our continuing mission to provide you with exceptional heart care, we have created designated Provider Care Teams.  These Care Teams include your primary Cardiologist (physician) and Advanced Practice Providers (APPs -  Physician Assistants and Nurse Practitioners) who all work together to provide you with the care you need, when you need it.  We recommend signing up for the patient portal called "MyChart".  Sign up information is provided on this After Visit Summary.  MyChart is used to connect with patients for Virtual Visits (Telemedicine).  Patients are able to view lab/test results, encounter notes, upcoming appointments, etc.  Non-urgent messages can be sent to your provider as well.   To learn more about what you can do with MyChart, go to [email or web address].    Your next appointment:   6 month(s)  The format for your next appointment:   In Person  Provider:   You may see Larae Grooms, MD or one of the following Advanced Practice Providers on your designated Care Team:    Melina Copa, PA-C  Ermalinda Barrios, PA-C

## 2020-03-11 ENCOUNTER — Other Ambulatory Visit: Payer: 59 | Admitting: *Deleted

## 2020-03-11 ENCOUNTER — Other Ambulatory Visit: Payer: Self-pay

## 2020-03-11 DIAGNOSIS — I10 Essential (primary) hypertension: Secondary | ICD-10-CM

## 2020-03-11 DIAGNOSIS — E782 Mixed hyperlipidemia: Secondary | ICD-10-CM

## 2020-03-11 DIAGNOSIS — E1159 Type 2 diabetes mellitus with other circulatory complications: Secondary | ICD-10-CM

## 2020-03-11 DIAGNOSIS — I25119 Atherosclerotic heart disease of native coronary artery with unspecified angina pectoris: Secondary | ICD-10-CM

## 2020-03-12 LAB — BASIC METABOLIC PANEL
BUN/Creatinine Ratio: 17 (ref 12–28)
BUN: 12 mg/dL (ref 8–27)
CO2: 26 mmol/L (ref 20–29)
Calcium: 9.8 mg/dL (ref 8.7–10.3)
Chloride: 98 mmol/L (ref 96–106)
Creatinine, Ser: 0.71 mg/dL (ref 0.57–1.00)
GFR calc Af Amer: 105 mL/min/{1.73_m2} (ref >59)
GFR calc non Af Amer: 91 mL/min/{1.73_m2} (ref >59)
Glucose: 202 mg/dL — ABNORMAL HIGH (ref 65–99)
Potassium: 4.1 mmol/L (ref 3.5–5.2)
Sodium: 138 mmol/L (ref 134–144)

## 2020-03-12 LAB — PRO B NATRIURETIC PEPTIDE: NT-Pro BNP: 28 pg/mL (ref 0–287)

## 2020-05-12 ENCOUNTER — Ambulatory Visit
Admission: EM | Admit: 2020-05-12 | Discharge: 2020-05-12 | Disposition: A | Discharge status: Home / Self Care | Payer: 59 | Attending: Family Medicine | Admitting: Family Medicine

## 2020-05-12 ENCOUNTER — Other Ambulatory Visit: Payer: Self-pay

## 2020-05-12 ENCOUNTER — Encounter: Payer: Self-pay | Admitting: Emergency Medicine

## 2020-05-12 DIAGNOSIS — I10 Essential (primary) hypertension: Secondary | ICD-10-CM | POA: Insufficient documentation

## 2020-05-12 DIAGNOSIS — K529 Noninfective gastroenteritis and colitis, unspecified: Secondary | ICD-10-CM | POA: Diagnosis present

## 2020-05-12 LAB — POCT URINALYSIS DIP (MANUAL ENTRY)
Bilirubin, UA: NEGATIVE
Glucose, UA: NEGATIVE mg/dL
Ketones, POC UA: NEGATIVE mg/dL
Nitrite, UA: NEGATIVE
Protein Ur, POC: NEGATIVE mg/dL
Spec Grav, UA: 1.01 (ref 1.010–1.025)
Urobilinogen, UA: 1 E.U./dL
pH, UA: 6.5 (ref 5.0–8.0)

## 2020-05-12 MED ORDER — ONDANSETRON 8 MG PO TBDP: 8.0000 mg | ORAL_TABLET | Freq: Three times a day (TID) | ORAL | 0 refills | Status: DC | PRN

## 2020-05-12 NOTE — ED Triage Notes (Signed)
Thinks she may have food poison.  States she is no longer having diarrhea or vomiting, states she just feels bad

## 2020-05-12 NOTE — ED Provider Notes (Signed)
[facility]    CSN: [id number] Arrival date & time: 05/12/20  1320      History   Chief Complaint No chief complaint on file.   HPI [name] is a 65 y.o. female.   HPI  Medical history significant for CHF, hypertension, diabetes, severe obesity Patient presents with two days of watery stools, nausea, vomiting.  Recently ate out a restaurant thought she may have acquired food poison. Uncertain if anyone else became ill from eating at the same place. Unaware of sick contacts.  She did not eat any dinner last night however was able to tolerate small breakfast at a restaurant this morning. Unrelated patient's blood pressure is elevated on arrival today.  She reports she has not taken her blood pressure medicine in over the last few days due to GI symptoms.  She endorses having plenty of medications at home just had not taken them due to not feeling well.  Denies any symptoms of dizziness, shortness of breath, or generalized weakness.   Past Medical History:  Diagnosis Date  . Arthritis   . Asthma   . CHF (congestive heart failure) ([place])   . Coronary artery disease   . Diabetes mellitus   . Diabetic neuropathy ([place])   . GERD (gastroesophageal reflux disease)   . History of hiatal hernia   . Hx of cardiovascular stress test    Lexiscan Myoview (10/15):  Medium area of ischemia in AL and IL distribution, cannot completely exclude shifting breast attenuation, EF 64%; Abnormal study  . Hyperlipidemia   . Hypertension   . Morbid obesity ([place])   . Sleep apnea    does not wear CPAP    Patient Active Problem List   Diagnosis Date Noted  . Coronary atherosclerosis of native coronary artery 07/17/2013  . Mixed hyperlipidemia 07/17/2013  . Essential hypertension, benign 07/17/2013  . Obesity 08/11/2011    Past Surgical History:  Procedure Laterality Date  . [place]   right  . COLONOSCOPY    . DILATION AND CURETTAGE OF UTERUS    . HAND  SURGERY     right  . heart stent  2007  . PARS PLANA VITRECTOMY Left 03/24/2017   Procedure: PARS PLANA VITRECTOMY WITH 25 GAUGE;  Surgeon: Hurman Horn, MD;  Location: [facility];  Service: Ophthalmology;  Laterality: Left;  . UTERINE FIBROID SURGERY  2008    OB History   No obstetric history on file.      Home Medications    Prior to Admission medications   Medication Sig Start Date End Date Taking? Authorizing Provider  ondansetron (ZOFRAN-ODT) 8 MG disintegrating tablet Take 1 tablet (8 mg total) by mouth every 8 (eight) hours as needed for nausea or vomiting. 05/12/20  Yes Scot Jun, FNP  albuterol (PROVENTIL HFA;VENTOLIN HFA) 108 (90 Base) MCG/ACT inhaler Inhale 1-2 puffs into the lungs every 6 (six) hours as needed for wheezing or shortness of breath. 05/24/17   Long, Wonda Olds, MD  amLODipine (NORVASC) 10 MG tablet Take 1 tablet by mouth once daily 03/04/20   Jettie Booze, MD  aspirin EC 81 MG tablet Take 1 tablet (81 mg total) by mouth daily. 03/03/19   Jettie Booze, MD  BREO ELLIPTA 100-25 MCG/INH AEPB Inhale 1 puff into the lungs daily. 09/21/16   [provider]  carvedilol (COREG) 6.25 MG tablet Take 1 tablet by mouth twice daily 03/04/20   Jettie Booze, MD  clopidogrel (  PLAVIX) 75 MG tablet Take 1 tablet by mouth once daily 03/04/20   Jettie Booze, MD  dexlansoprazole (DEXILANT) 60 MG capsule Take 1 capsule (60 mg total) by mouth daily. 03/03/19   Jettie Booze, MD  Dulaglutide 0.75 MG/0.5ML SOPN Inject 0.75 mg into the skin every Tuesday.    [provider]  FREESTYLE LITE test strip 1 each by Other route as directed.  07/08/11   [provider]  furosemide (LASIX) 40 MG tablet Take 2 tablets by mouth once daily 03/04/20   Jettie Booze, MD  HUMULIN R 500 UNIT/ML SOLN injection Inject 80-110 Units into the skin 2 (two) times daily with a meal. 110 units in the morning and 80 units in the evening 12/25/13    [provider]  lisinopril (ZESTRIL) 40 MG tablet Take 1 tablet by mouth once daily 03/04/20   Jettie Booze, MD  nitroGLYCERIN (NITROSTAT) 0.4 MG SL tablet Place 1 tablet (0.4 mg total) under the tongue every 5 (five) minutes as needed for chest pain (X 3 DOSES FOR CHEST PAIN). 11/30/17   Jettie Booze, MD  potassium chloride (KLOR-CON) 10 MEQ tablet Take 2 tablets (20 mEq total) by mouth daily. 03/04/20   Jettie Booze, MD  rosuvastatin (CRESTOR) 40 MG tablet Take 1 tablet by mouth once daily 03/04/20   Jettie Booze, MD    Family History Family History  Problem Relation Age of Onset  . Kidney disease Father   . Stroke Father   . Heart attack Father   . Stroke Maternal Grandfather   . Stroke Paternal Grandfather   . Asthma Other   . Hypertension Other   . Diabetes Other     Social History Social History   Tobacco Use  . Smoking status: Former Smoker    Types: Cigarettes    Quit date: 08/07/1982    Years since quitting: 37.7  . Smokeless tobacco: Never Used  Vaping Use  . Vaping Use: Never used  Substance Use Topics  . Alcohol use: No  . Drug use: No     Allergies   Bystolic [nebivolol hcl], Crestor [rosuvastatin], Sulfa antibiotics, Metformin and related, and Other   Review of Systems Review of Systems Pertinent negatives listed in HPI Physical Exam Triage Vital Signs ED Triage Vitals  Enc Vitals Group     BP 05/12/20 1508 (S) (!) 210/111     Pulse Rate 05/12/20 1508 88     Resp 05/12/20 1508 18     Temp 05/12/20 1508 97.6 F (36.4 C)     Temp Source 05/12/20 1508 Oral     SpO2 05/12/20 1508 93 %     Weight --      Height --      Head Circumference --      Peak Flow --      Pain Score 05/12/20 1510 5     Pain Loc --      Pain Edu? --      Excl. in [place]? --    No data found.  Updated Vital Signs BP (S) (!) 210/111 (BP Location: Right Arm) Comment: States she has not taken her BP medication  Pulse 88   Temp 97.6 F  (36.4 C) (Oral)   Resp 18   LMP  (LMP Unknown)   SpO2 93%   Visual Acuity Right Eye Distance:   Left Eye Distance:   Bilateral Distance:    Right Eye Near:   Left Eye  Near:    Bilateral Near:     Physical Exam Constitutional:      General: She is not in acute distress.    Appearance: She is obese. She is not toxic-appearing.     Comments: Chronically ill appearing   HENT:     Head: Normocephalic.  Cardiovascular:     Rate and Rhythm: Normal rate and regular rhythm.  Pulmonary:     Comments: Generalized diminished lung sounds however clear to auscultation bilaterally Abdominal:     General: There is distension.     Palpations: Abdomen is soft.     Comments: Hyperactive bowel sounds . No palpable tenderness x4 quadrants  Skin:    General: Skin is warm and dry.     Capillary Refill: Capillary refill takes less than 2 seconds.  Neurological:     General: No focal deficit present.     Mental Status: She is oriented to person, place, and time.     Motor: No weakness.     Gait: Gait normal.  Psychiatric:        Mood and Affect: Mood normal.        Behavior: Behavior normal.        Thought Content: Thought content normal.        Judgment: Judgment normal.      UC Treatments / Results  Labs (all labs ordered are listed, but only abnormal results are displayed) Labs Reviewed  POCT URINALYSIS DIP (MANUAL ENTRY) - Abnormal; Notable for the following components:      Result Value   Blood, UA trace-intact (*)    Leukocytes, UA Trace (*)    All other components within normal limits  URINE CULTURE    EKG   Radiology No results found.  Procedures Procedures (including critical care time)  Medications Ordered in UC Medications - No data to display  Initial Impression / Assessment and Plan / UC Course  I have reviewed the triage vital signs and the nursing notes.  Pertinent labs & imaging results that were available during my care of the patient were reviewed  by me and considered in my medical decision making (see chart for details).     Patient presents today with gastroenteritis symptoms however was able to tolerate a bland meal earlier today.  She is not actively vomiting but endorses some nausea.  Of main concern patient has not taken her blood pressure medicine over the last few days and blood pressure on arrival is significantly elevated.  Patient reports having plenty of her medication at home and patient suffers from CHF and is on several blood pressure lowering medications.  Advised patient to take medication as soon as she is arrives home given that she is currently not nauseous.  She has been given a dose of Zofran here in clinic which she tolerated.  Discharging home with Zofran and strict ER precautions if symptoms worsen or do not improve.  UA showed a trace of leuks therefore will culture urine to rule out an underlying UTI as a source of symptoms today. Urine specific gravity is normal indicating patient is adequately hydrated.  Patient verbalized understanding agreement with plan today. Final Clinical Impressions(s) / UC Diagnoses   Final diagnoses:  Accelerated hypertension  Gastroenteritis     Discharge Instructions     Take your blood pressure medication once you arrive home. Increase food intake as tolerated. Follow-up with your primary doctor if symptoms have not improved by tomorrow or if blood pressure remains >160/100.  I am prescribing Zofran for you take at home for nausea. Go to the emergency department if symptoms worsen.    ED Prescriptions    Medication Sig Dispense Auth. Provider   ondansetron (ZOFRAN-ODT) 8 MG disintegrating tablet Take 1 tablet (8 mg total) by mouth every 8 (eight) hours as needed for nausea or vomiting. 30 tablet Scot Jun, FNP     PDMP not reviewed this encounter.   Scot Jun, [place] 05/15/20 [phone]

## 2020-05-12 NOTE — Discharge Instructions (Addendum)
Take your blood pressure medication once you arrive home. Increase food intake as tolerated. Follow-up with your primary doctor if symptoms have not improved by tomorrow or if blood pressure remains >160/100. I am prescribing Zofran for you take at home for nausea. Go to the emergency department if symptoms worsen.

## 2020-05-14 LAB — URINE CULTURE

## 2020-05-20 ENCOUNTER — Other Ambulatory Visit: Payer: Self-pay | Admitting: Family Medicine

## 2020-05-20 ENCOUNTER — Ambulatory Visit
Admission: RE | Admit: 2020-05-20 | Discharge: 2020-05-20 | Disposition: A | Discharge status: Home / Self Care | Payer: 59 | Source: Ambulatory Visit | Attending: Family Medicine | Admitting: Family Medicine

## 2020-05-20 ENCOUNTER — Telehealth: Payer: Self-pay | Admitting: Interventional Cardiology

## 2020-05-20 DIAGNOSIS — R109 Unspecified abdominal pain: Secondary | ICD-10-CM

## 2020-05-20 IMAGING — CT CT ABD-PELV W/ CM
2 of 5 series · 14 of 46 positions shown, 16 images · IV contrast (iopamidol)
Comparison: [DATE]

CLINICAL DATA: Right-sided abdominal pain for 1 week, recent food
poisoning

EXAM:
CT ABDOMEN AND PELVIS WITH CONTRAST
TECHNIQUE: Multidetector CT imaging of the abdomen and pelvis was performed
using the standard protocol following bolus administration of
intravenous contrast.
Creatinine was obtained on site at [HOSPITAL] at [REDACTED].
Results: Creatinine 0.7 mg/dL.
CONTRAST:  100mL [PU] IOPAMIDOL ([PU]) INJECTION 61%

[Series 2: abd pelvis 5.00 br40 s3 axial · axial · 0.83mm/px · z∈[+1291,+1771]mm · 11 of 110 slices shown, 13 images]
[im 7/110  soft-tissue]
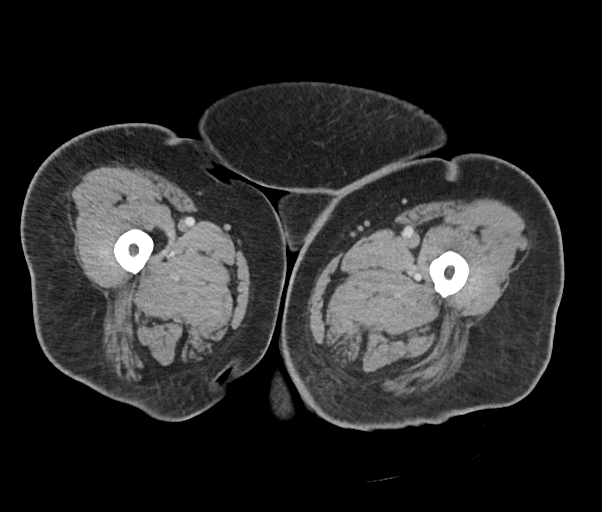
[im 7/110  bone]
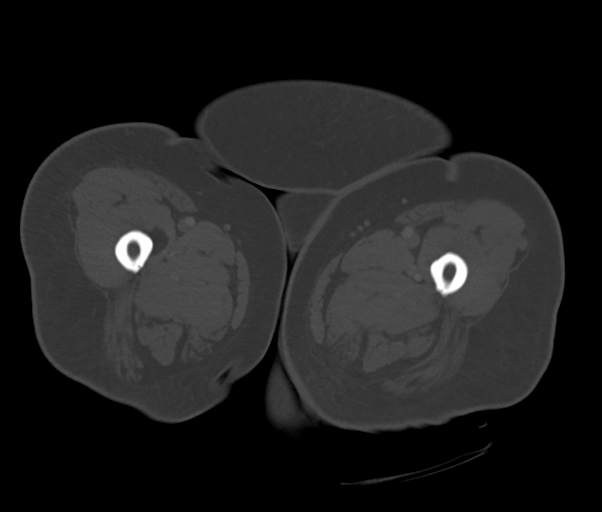
[im 20/110  soft-tissue]
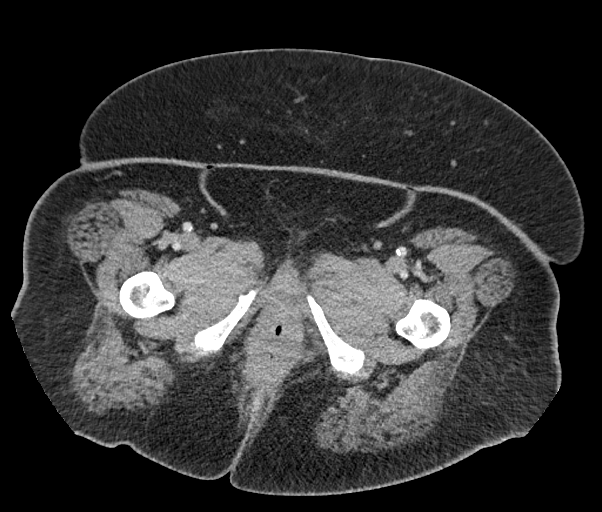
[im 26/110  soft-tissue]
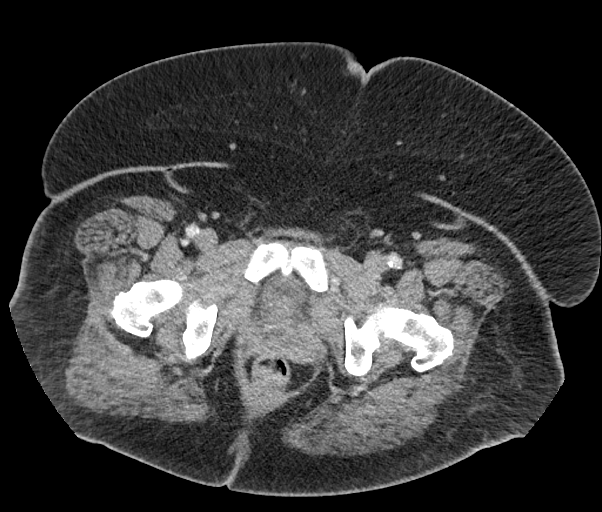
[im 39/110  soft-tissue]
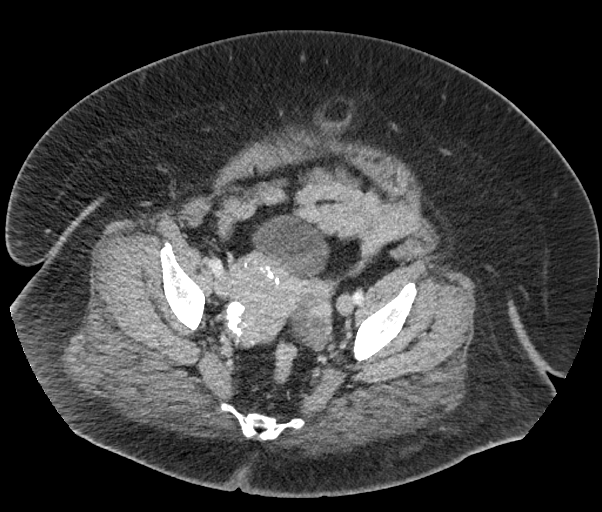
[im 45/110  soft-tissue]
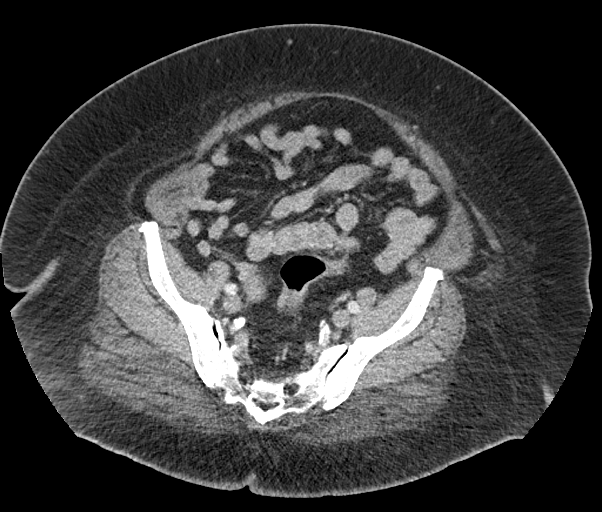
[im 58/110  soft-tissue]
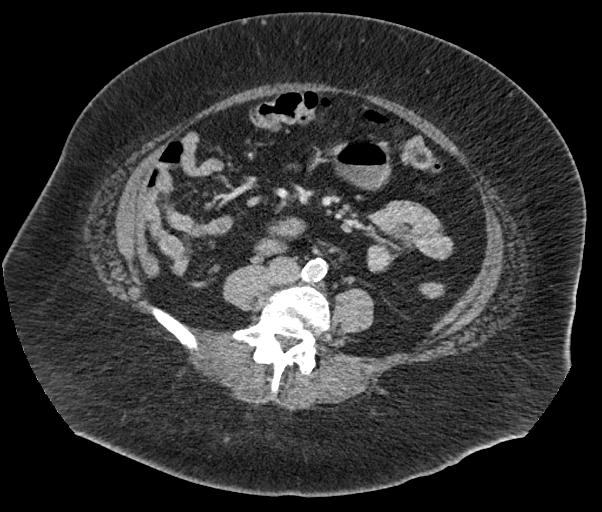
[im 65/110  soft-tissue]
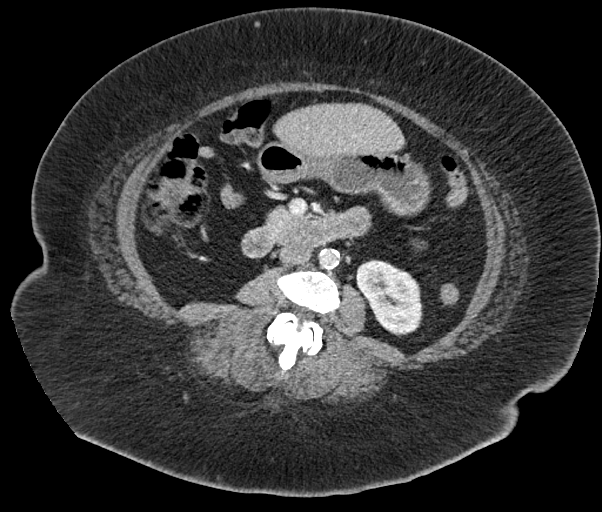
[im 71/110  soft-tissue]
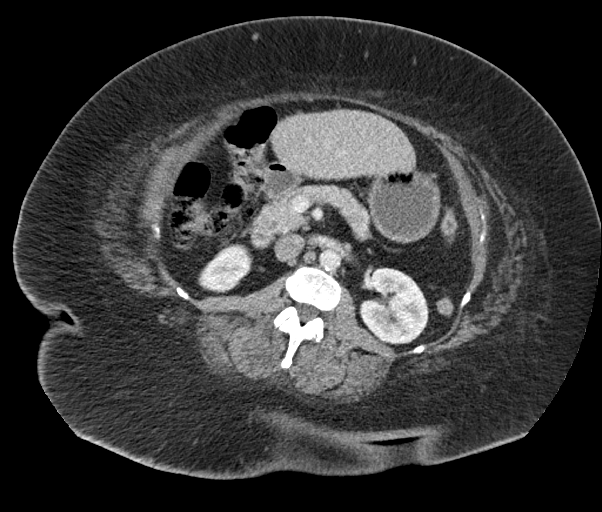
[im 84/110  soft-tissue]
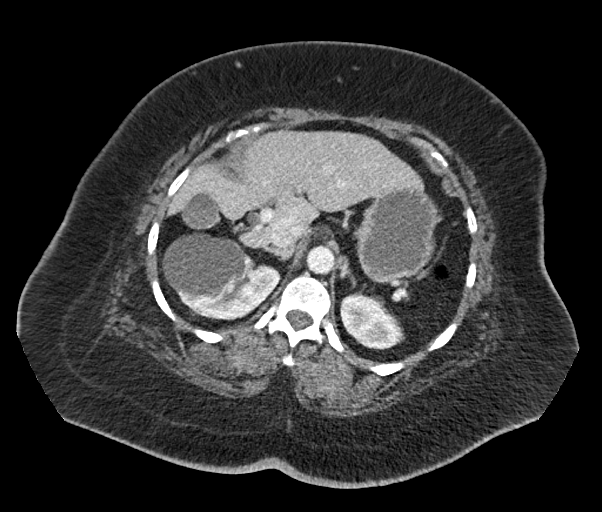
[im 84/110  bone]
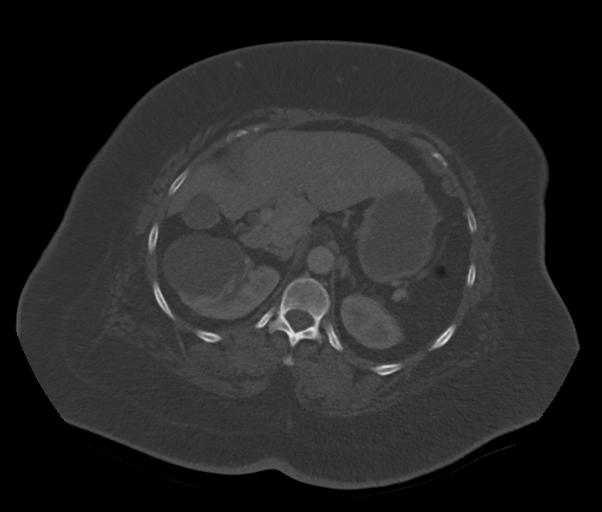
[im 90/110  soft-tissue]
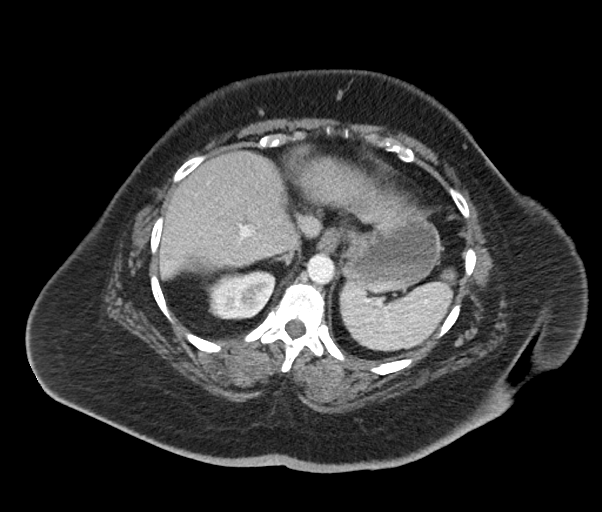
[im 103/110  soft-tissue]
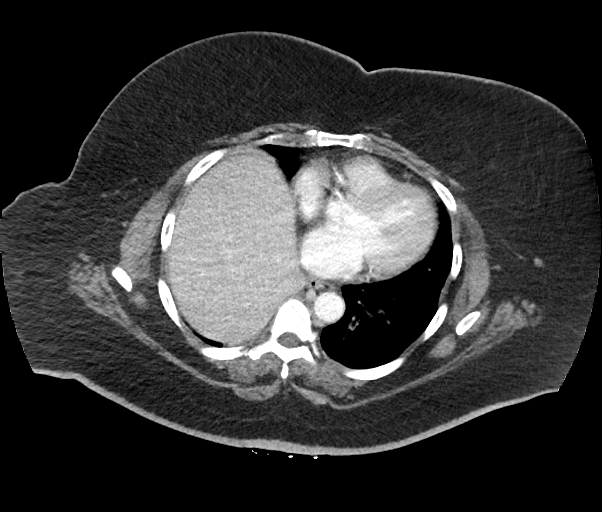

[Series 6: abd pelvis 2.00 br40 s3 cor · coronal · 0.98mm/px · 3 of 208 slices shown]
[im 70/208  soft-tissue]
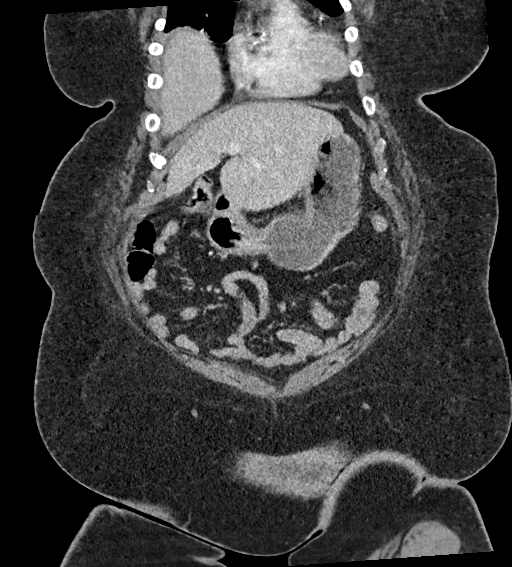
[im 93/208  soft-tissue]
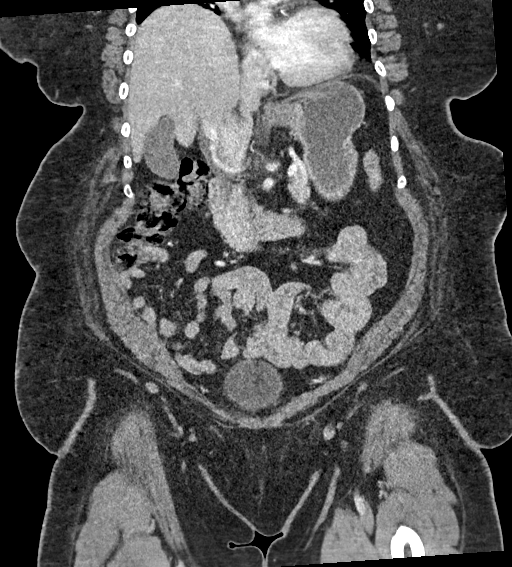
[im 116/208  soft-tissue]
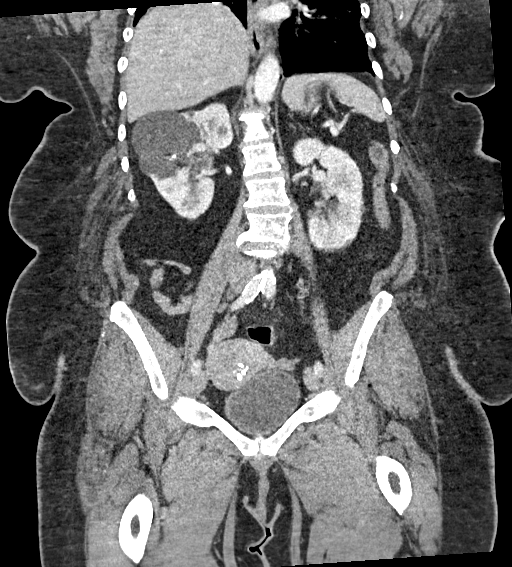

[14 of 46 positions shown; findings below may reference images not displayed]

FINDINGS: Lower chest: No acute pleural or parenchymal lung disease.

Hepatobiliary: No focal liver abnormality is seen. No gallstones,
gallbladder wall thickening, or biliary dilatation.

Pancreas: Unremarkable. No pancreatic ductal dilatation or
surrounding inflammatory changes.

Spleen: Normal in size without focal abnormality.

Adrenals/Urinary Tract: A mildly complex right renal cyst is again
identified, measuring approximately 6.9 x 5.5 cm on today's study,
previously measuring 6.1 x 5.0 cm. Single thin septation is again
identified. No mural thickening or nodularity. Calcifications within
the mid right kidney measuring 18 mm in 16 mm are not appreciably
changed, and likely reflect caliceal calculi.

The left kidney enhances normally.  No obstructive uropathy.

The bladder and adrenals are grossly normal.

Stomach/Bowel: No bowel obstruction or ileus. Normal appendix right
lower quadrant. No bowel wall thickening or inflammatory change.

Vascular/Lymphatic: Aortic atherosclerosis. No enlarged abdominal or
pelvic lymph nodes.

Reproductive: Calcified uterine fibroids are again identified. No
adnexal masses.

Other: No free fluid or free gas.  No abdominal wall hernia.

Musculoskeletal: No acute or destructive bony lesions. Reconstructed
images demonstrate no additional findings.
IMPRESSION: 1. Minimally complex right renal cyst with single thin septation.
Slight increased in size since prior study. Dedicated renal MRI is
recommended if not performed in the interim.
2. Calcifications interpolar region right kidney, likely
representing nonobstructing caliceal calculi.
3. Otherwise no acute intra-abdominal or intrapelvic process.
4.  Aortic Atherosclerosis ([PU]-[PU]).

## 2020-05-20 MED ORDER — IOPAMIDOL (ISOVUE-300) INJECTION 61%
100.0000 mL | Freq: Once | INTRAVENOUS | Status: AC | PRN
  Administered 2020-05-20: 100 mL via INTRAVENOUS

## 2020-05-20 NOTE — Telephone Encounter (Signed)
Patient is requesting a written letter, highlighting her heart issues for her to remain in her home. She states she has to turn in the letter within the next 10 days.

## 2020-05-20 NOTE — Telephone Encounter (Signed)
I spoke with patient.  She is requesting letter indicating why she is seen by Dr Irish Lack.  Letter should include she has handicap sticker due to shortness of breath.  Should also include list of her medications prescribed by Dr Irish Lack. Patient would like to pick up letter when ready

## 2020-05-21 ENCOUNTER — Encounter: Payer: Self-pay | Admitting: *Deleted

## 2020-05-21 NOTE — Telephone Encounter (Signed)
Yes. She is seen for CAD, (prior stent placed), and other cardiac risk factors.   JV

## 2020-05-21 NOTE — Telephone Encounter (Signed)
Patient notified letter is ready.  She will stop by office to pick up.  Letter placed at front desk

## 2020-05-28 ENCOUNTER — Other Ambulatory Visit: Payer: Self-pay

## 2020-05-28 ENCOUNTER — Encounter: Payer: Self-pay | Admitting: Emergency Medicine

## 2020-05-28 ENCOUNTER — Ambulatory Visit
Admission: EM | Admit: 2020-05-28 | Discharge: 2020-05-28 | Disposition: A | Discharge status: Home / Self Care | Payer: 59 | Attending: Family Medicine | Admitting: Family Medicine

## 2020-05-28 ENCOUNTER — Ambulatory Visit (INDEPENDENT_AMBULATORY_CARE_PROVIDER_SITE_OTHER): Payer: 59

## 2020-05-28 DIAGNOSIS — R059 Cough, unspecified: Secondary | ICD-10-CM

## 2020-05-28 DIAGNOSIS — R0602 Shortness of breath: Secondary | ICD-10-CM | POA: Diagnosis not present

## 2020-05-28 DIAGNOSIS — J209 Acute bronchitis, unspecified: Secondary | ICD-10-CM

## 2020-05-28 IMAGING — DX DG CHEST 2V
3 series · 3 of 3 positions shown · non-contrast
Comparison: [DATE].

CLINICAL DATA: Cough, shortness of breath.

EXAM:
CHEST - 2 VIEW

[chest pa (1 of 2)]
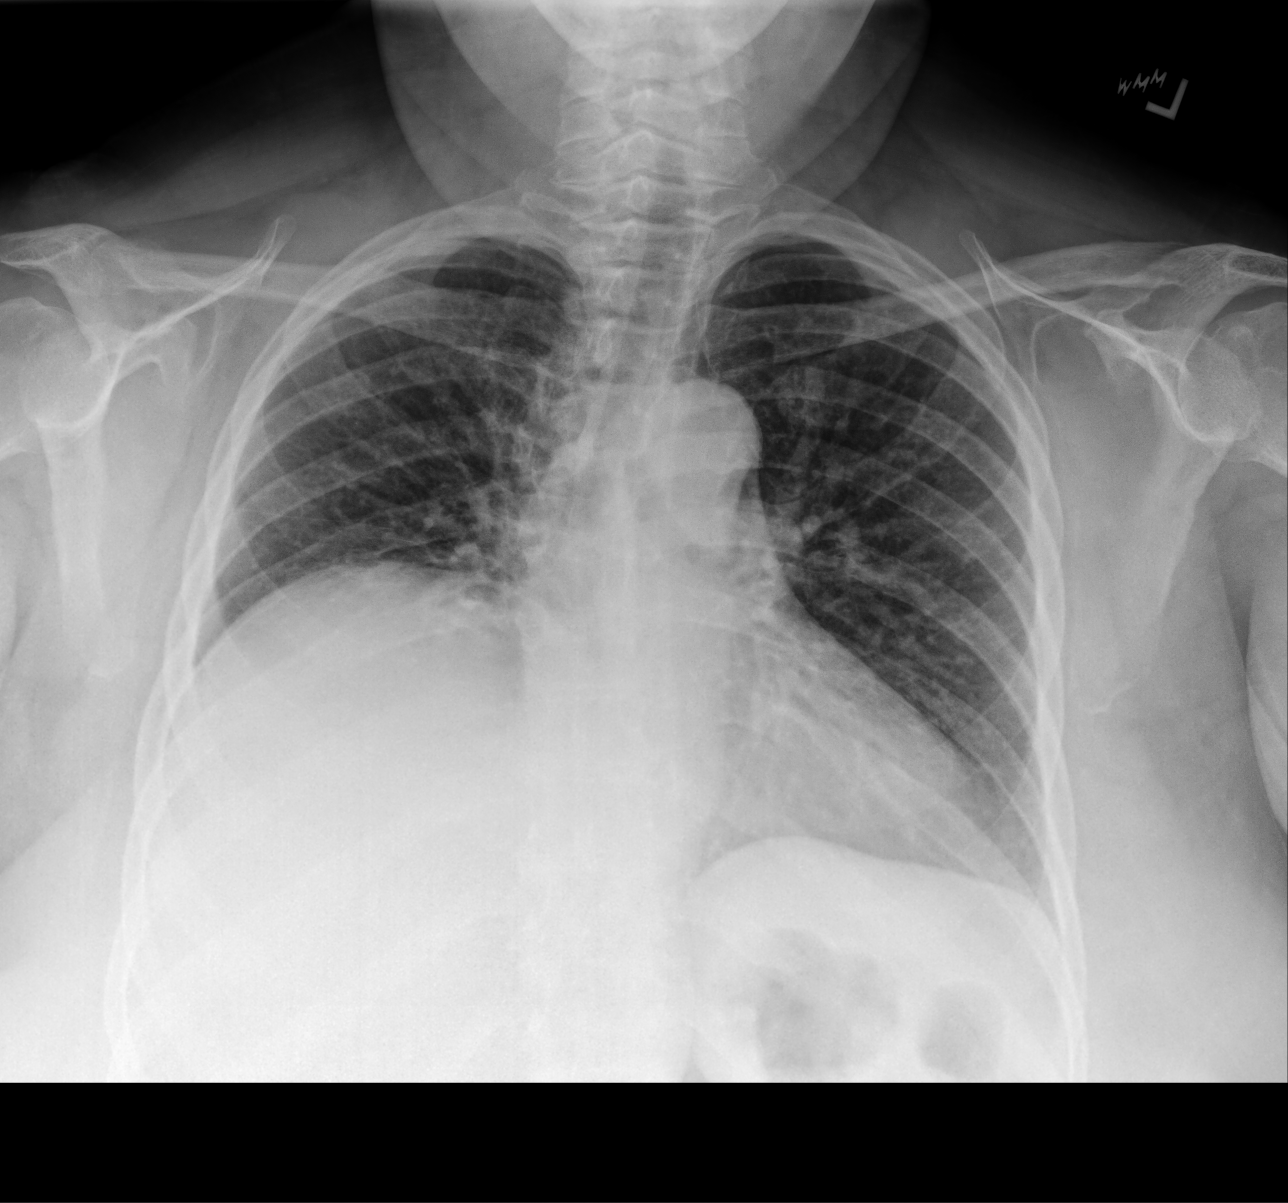

[chest pa (2 of 2)]
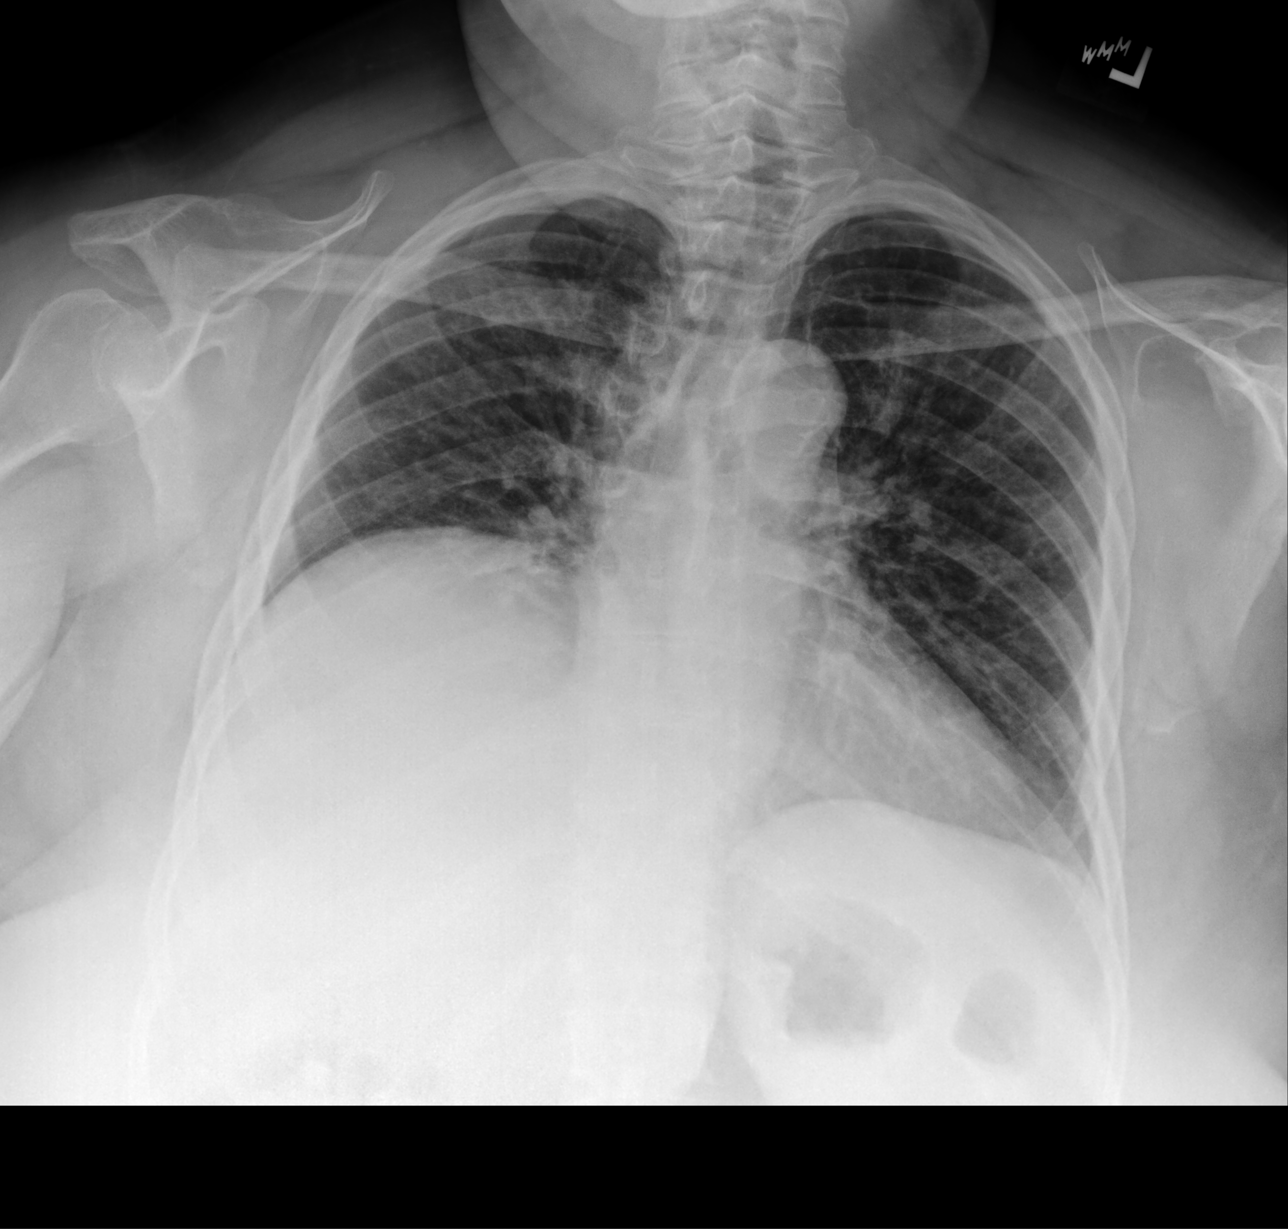

[chest lat]
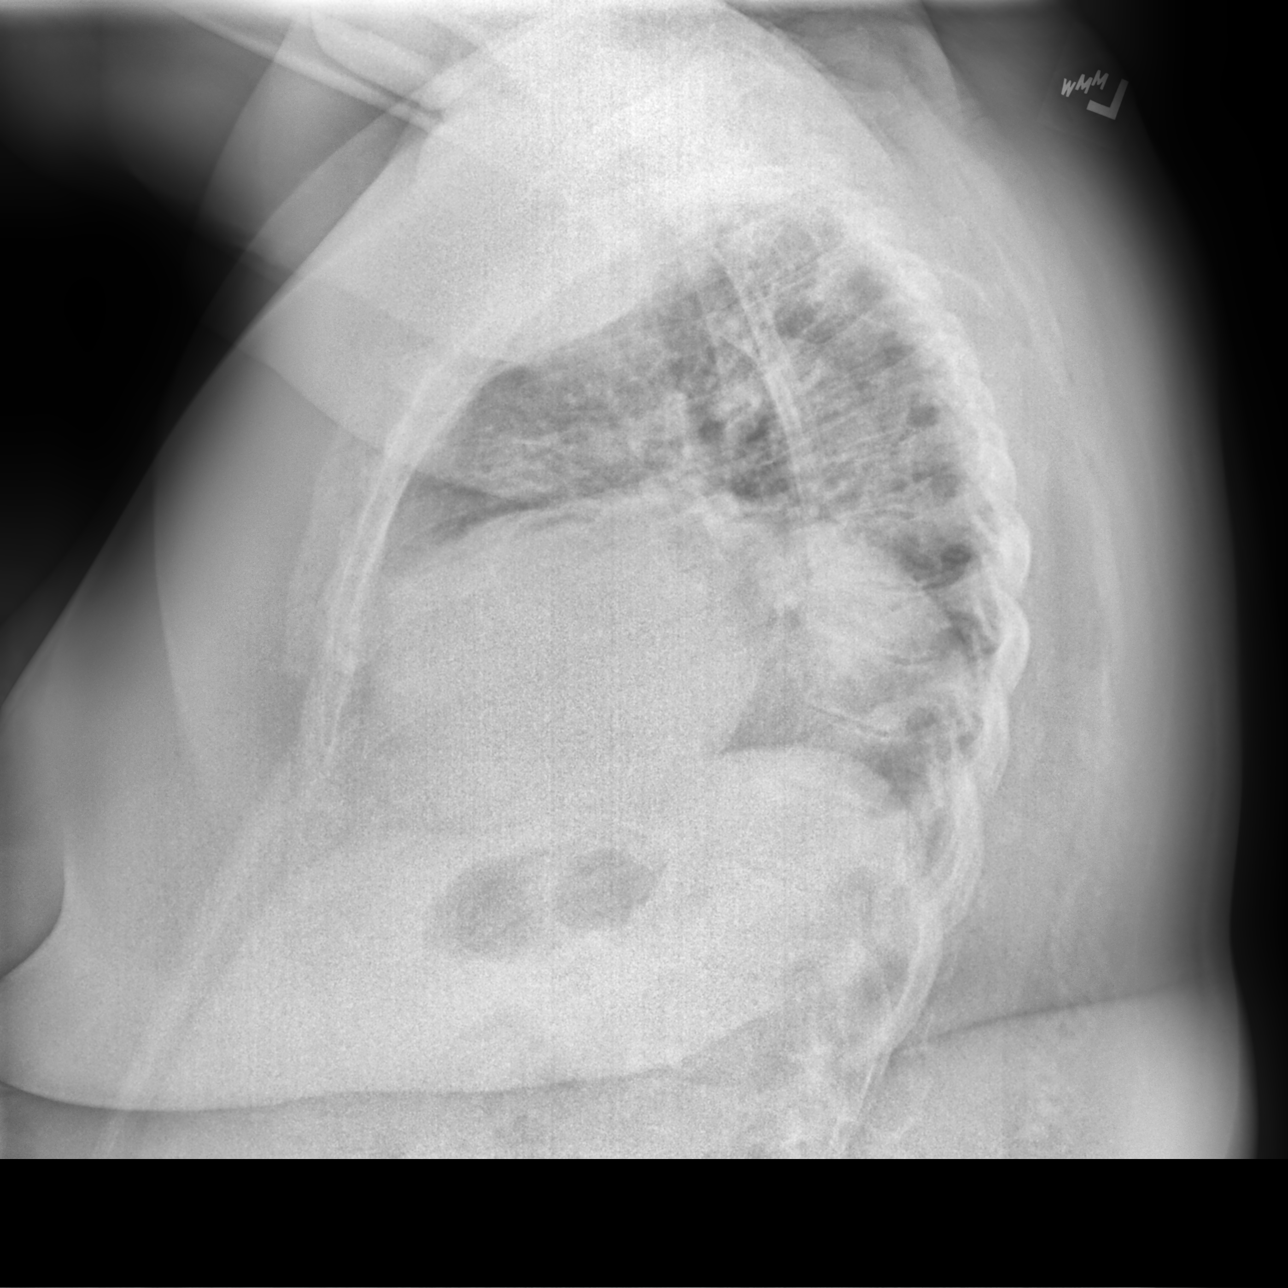

[3 of 3 positions shown; findings below may reference images not displayed]

FINDINGS: The heart size and mediastinal contours are within normal limits.
Both lungs are clear. No pneumothorax or pleural effusion is noted.
Stable elevated right hemidiaphragm is noted. The visualized
skeletal structures are unremarkable.
IMPRESSION: No active cardiopulmonary disease.

## 2020-05-28 MED ORDER — ALBUTEROL SULFATE (2.5 MG/3ML) 0.083% IN NEBU: 2.5000 mg | INHALATION_SOLUTION | Freq: Four times a day (QID) | RESPIRATORY_TRACT | 12 refills | Status: DC | PRN

## 2020-05-28 MED ORDER — GUAIFENESIN-CODEINE 100-10 MG/5ML PO SYRP: 5.0000 mL | ORAL_SOLUTION | Freq: Three times a day (TID) | ORAL | 0 refills | Status: DC | PRN

## 2020-05-28 MED ORDER — PREDNISONE 10 MG (21) PO TBPK: ORAL_TABLET | Freq: Every day | ORAL | 0 refills | Status: AC

## 2020-05-28 MED ORDER — METHYLPREDNISOLONE SODIUM SUCC 125 MG IJ SOLR
125.0000 mg | Freq: Once | INTRAMUSCULAR | Status: AC
  Administered 2020-05-28: 125 mg via INTRAMUSCULAR

## 2020-05-28 NOTE — ED Triage Notes (Signed)
Productive cough since Thursday.  States she is having some shortness of breath and wheezing today.

## 2020-05-28 NOTE — Discharge Instructions (Addendum)
Chest xray is negative for pneumonia today  I have sent in a prednisone taper for you to take for 6 days. 6 tablets on day one, 5 tablets on day two, 4 tablets on day three, 3 tablets on day four, 2 tablets on day five, and 1 tablet on day six.  I have sent in albuterol for nebulizer for you to use one treatment every 4 hours as needed for cough, shortness of breath, wheezing  I have sent in cough syrup for you to take. This medication can make you sleepy. Do not drive while taking this medication.  Your COVID and Influenza tests are pending.  You should self quarantine until the test results are back.    Take Tylenol or ibuprofen as needed for fever or discomfort.  Rest and keep yourself hydrated.    Follow-up with your primary care provider if your symptoms are not improving.

## 2020-05-28 NOTE — ED Provider Notes (Signed)
[place]   [id number] 05/28/20 Arrival Time: 47   CC: COVID symptoms  SUBJECTIVE: History from: patient.  [name] is a 65 y.o. female who presents with productive cough, SOB, fatigue, chest tightness that has gradually worsened over the last week. Has hx hypertension, asthma, CHF, CAD, type 2 DM, GERD, obesity, hyperlipidemia. Denies sick exposure to COVID, flu or strep. Denies recent travel. Has used her inhaler for cough and SOB with mild relief. Symptoms are worsened with activity. Reports previous symptoms in the past. Denies fever, chills, sinus pain, rhinorrhea, sore throat, nausea, changes in bowel or bladder habits.    ROS: As per HPI.  All other pertinent ROS negative.     Past Medical History:  Diagnosis Date  . Arthritis   . Asthma   . CHF (congestive heart failure) ([place])   . Coronary artery disease   . Diabetes mellitus   . Diabetic neuropathy ([place])   . GERD (gastroesophageal reflux disease)   . History of hiatal hernia   . Hx of cardiovascular stress test    Lexiscan Myoview (10/15):  Medium area of ischemia in AL and IL distribution, cannot completely exclude shifting breast attenuation, EF 64%; Abnormal study  . Hyperlipidemia   . Hypertension   . Morbid obesity ([place])   . Sleep apnea    does not wear CPAP   Past Surgical History:  Procedure Laterality Date  . [place]   right  . COLONOSCOPY    . DILATION AND CURETTAGE OF UTERUS    . HAND SURGERY     right  . heart stent  2007  . PARS PLANA VITRECTOMY Left 03/24/2017   Procedure: PARS PLANA VITRECTOMY WITH 25 GAUGE;  Surgeon: Hurman Horn, MD;  Location: [facility];  Service: Ophthalmology;  Laterality: Left;  . UTERINE FIBROID SURGERY  2008   Allergies  Allergen Reactions  . Bystolic [Nebivolol Hcl] Palpitations  . Crestor [Rosuvastatin] Other (See Comments)    Couldn't tolerate Crestor 40 mg dose   . Sulfa Antibiotics Anaphylaxis  . Metformin And Related  Diarrhea  . Other     Ok [place] anything that has this in it makes her wheeze   No current facility-administered medications on file prior to encounter.   Current Outpatient Medications on File Prior to Encounter  Medication Sig Dispense Refill  . albuterol (PROVENTIL HFA;VENTOLIN HFA) 108 (90 Base) MCG/ACT inhaler Inhale 1-2 puffs into the lungs every 6 (six) hours as needed for wheezing or shortness of breath. 1 Inhaler 0  . amLODipine (NORVASC) 10 MG tablet Take 1 tablet by mouth once daily 90 tablet 1  . aspirin EC 81 MG tablet Take 1 tablet (81 mg total) by mouth daily. 90 tablet 3  . BREO ELLIPTA 100-25 MCG/INH AEPB Inhale 1 puff into the lungs daily.    . carvedilol (COREG) 6.25 MG tablet Take 1 tablet by mouth twice daily 180 tablet 1  . clopidogrel (PLAVIX) 75 MG tablet Take 1 tablet by mouth once daily 90 tablet 1  . dexlansoprazole (DEXILANT) 60 MG capsule Take 1 capsule (60 mg total) by mouth daily. 90 capsule 3  . Dulaglutide 0.75 MG/0.5ML SOPN Inject 0.75 mg into the skin every Tuesday.    Marland Kitchen FREESTYLE LITE test strip 1 each by Other route as directed.     . furosemide (LASIX) 40 MG tablet Take 2 tablets by mouth once daily 180 tablet 1  . HUMULIN R 500 UNIT/ML SOLN injection Inject  80-110 Units into the skin 2 (two) times daily with a meal. 110 units in the morning and 80 units in the evening    . lisinopril (ZESTRIL) 40 MG tablet Take 1 tablet by mouth once daily 90 tablet 1  . nitroGLYCERIN (NITROSTAT) 0.4 MG SL tablet Place 1 tablet (0.4 mg total) under the tongue every 5 (five) minutes as needed for chest pain (X 3 DOSES FOR CHEST PAIN). 25 tablet 3  . ondansetron (ZOFRAN-ODT) 8 MG disintegrating tablet Take 1 tablet (8 mg total) by mouth every 8 (eight) hours as needed for nausea or vomiting. 30 tablet 0  . potassium chloride (KLOR-CON) 10 MEQ tablet Take 2 tablets (20 mEq total) by mouth daily. 120 tablet 1  . rosuvastatin (CRESTOR) 40 MG tablet Take 1 tablet by mouth once daily  90 tablet 1   Social History   Socioeconomic History  . Marital status: Single    Spouse name: Not on file  . Number of children: Not on file  . Years of education: Not on file  . Highest education level: Not on file  Occupational History  . Not on file  Tobacco Use  . Smoking status: Former Smoker    Types: Cigarettes    Quit date: 08/07/1982    Years since quitting: 37.8  . Smokeless tobacco: Never Used  Vaping Use  . Vaping Use: Never used  Substance and Sexual Activity  . Alcohol use: No  . Drug use: No  . Sexual activity: Not on file  Other Topics Concern  . Not on file  Social History Narrative  . Not on file   Social Determinants of Health   Financial Resource Strain: Not on file  Food Insecurity: Not on file  Transportation Needs: Not on file  Physical Activity: Not on file  Stress: Not on file  Social Connections: Not on file  Intimate Partner Violence: Not on file   Family History  Problem Relation Age of Onset  . Kidney disease Father   . Stroke Father   . Heart attack Father   . Stroke Maternal Grandfather   . Stroke Paternal Grandfather   . Asthma Other   . Hypertension Other   . Diabetes Other     OBJECTIVE:  Vitals:   05/28/20 1617 05/28/20 1619  BP:  (!) 157/94  Pulse: 85 89  Resp: 20   Temp:  99 F (37.2 C)  TempSrc:  Oral  SpO2: 95%      General appearance: alert; appears fatigued, but nontoxic; speaking in full sentences and tolerating own secretions HEENT: NCAT; Ears: EACs clear, TMs pearly gray; Eyes: PERRL.  EOM grossly intact. Sinuses: nontender; Nose: nares patent with clear rhinorrhea, Throat: oropharynx erythematous, cobblestoning present, tonsils non erythematous or enlarged, uvula midline  Neck: supple without LAD Lungs: unlabored respirations, symmetrical air entry; cough: moderate; no respiratory distress; diffuse wheezing throughout bilateral lung fields, diminished lung sounds to bilateral bases Heart: regular rate and  rhythm.  Radial pulses 2+ symmetrical bilaterally Skin: warm and dry Psychological: alert and cooperative; normal mood and affect  LABS:  No results found for this or any previous visit (from the past 24 hour(s)).   ASSESSMENT & PLAN:  1. Acute bronchitis, unspecified organism     Meds ordered this encounter  Medications  . methylPREDNISolone sodium succinate (SOLU-MEDROL) 125 mg/2 mL injection 125 mg  . predniSONE (STERAPRED UNI-PAK 21 TAB) 10 MG (21) TBPK tablet    Sig: Take by mouth daily for 6  days. Take 6 tablets on day 1, 5 tablets on day 2, 4 tablets on day 3, 3 tablets on day 4, 2 tablets on day 5, 1 tablet on day 6    Dispense:  21 tablet    Refill:  0    Order Specific Question:   Supervising Provider    Answer:   Chase Picket [id number]  . guaiFENesin-codeine (ROBITUSSIN AC) 100-10 MG/5ML syrup    Sig: Take 5 mLs by mouth 3 (three) times daily as needed for cough.    Dispense:  120 mL    Refill:  0    Order Specific Question:   Supervising Provider    Answer:   Chase Picket [id number]  . albuterol (PROVENTIL) (2.5 MG/3ML) 0.083% nebulizer solution    Sig: Take 3 mLs (2.5 mg total) by nebulization every 6 (six) hours as needed for wheezing or shortness of breath.    Dispense:  75 mL    Refill:  12    Order Specific Question:   Supervising Provider    Answer:   Chase Picket [[id number]]   Solumedrol 125mg  IM in office today Prescribed albuterol for nebulizer Prescribed steroid taper Prescribed cheratussin Sedation precautions given Continue supportive care at home Get plenty of rest and push fluids Use OTC zyrtec for nasal congestion, runny nose, and/or sore throat Use OTC flonase for nasal congestion and runny nose Use medications daily for symptom relief Use OTC medications like ibuprofen or tylenol as needed fever or pain Call or go to the ED if you have any new or worsening symptoms such as fever, worsening cough, shortness of breath, chest  tightness, chest pain, turning blue, changes in mental status.  Reviewed expectations re: course of current medical issues. Questions answered. Outlined signs and symptoms indicating need for more acute intervention. Patient verbalized understanding. After Visit Summary given.         Faustino Congress, NP 05/28/20 1708

## 2020-11-27 ENCOUNTER — Emergency Department (HOSPITAL_COMMUNITY): Payer: 59

## 2020-11-27 ENCOUNTER — Encounter (HOSPITAL_COMMUNITY): Payer: Self-pay | Admitting: Emergency Medicine

## 2020-11-27 ENCOUNTER — Observation Stay (HOSPITAL_COMMUNITY)
Admission: EM | Admit: 2020-11-27 | Discharge: 2020-11-29 | Disposition: A | Discharge status: Home / Self Care | Payer: 59 | Attending: Internal Medicine | Admitting: Internal Medicine

## 2020-11-27 DIAGNOSIS — Z79899 Other long term (current) drug therapy: Secondary | ICD-10-CM | POA: Diagnosis not present

## 2020-11-27 DIAGNOSIS — E669 Obesity, unspecified: Secondary | ICD-10-CM | POA: Diagnosis present

## 2020-11-27 DIAGNOSIS — E876 Hypokalemia: Secondary | ICD-10-CM | POA: Diagnosis not present

## 2020-11-27 DIAGNOSIS — I160 Hypertensive urgency: Secondary | ICD-10-CM | POA: Insufficient documentation

## 2020-11-27 DIAGNOSIS — Z7902 Long term (current) use of antithrombotics/antiplatelets: Secondary | ICD-10-CM | POA: Insufficient documentation

## 2020-11-27 DIAGNOSIS — R55 Syncope and collapse: Secondary | ICD-10-CM | POA: Insufficient documentation

## 2020-11-27 DIAGNOSIS — I4891 Unspecified atrial fibrillation: Secondary | ICD-10-CM

## 2020-11-27 DIAGNOSIS — K219 Gastro-esophageal reflux disease without esophagitis: Secondary | ICD-10-CM

## 2020-11-27 DIAGNOSIS — I509 Heart failure, unspecified: Secondary | ICD-10-CM | POA: Diagnosis not present

## 2020-11-27 DIAGNOSIS — Z20822 Contact with and (suspected) exposure to covid-19: Secondary | ICD-10-CM | POA: Insufficient documentation

## 2020-11-27 DIAGNOSIS — Z794 Long term (current) use of insulin: Secondary | ICD-10-CM | POA: Diagnosis not present

## 2020-11-27 DIAGNOSIS — E782 Mixed hyperlipidemia: Secondary | ICD-10-CM | POA: Diagnosis present

## 2020-11-27 DIAGNOSIS — Z87891 Personal history of nicotine dependence: Secondary | ICD-10-CM | POA: Insufficient documentation

## 2020-11-27 DIAGNOSIS — I480 Paroxysmal atrial fibrillation: Secondary | ICD-10-CM | POA: Diagnosis not present

## 2020-11-27 DIAGNOSIS — E1165 Type 2 diabetes mellitus with hyperglycemia: Secondary | ICD-10-CM | POA: Diagnosis not present

## 2020-11-27 DIAGNOSIS — I110 Hypertensive heart disease with heart failure: Secondary | ICD-10-CM | POA: Diagnosis not present

## 2020-11-27 DIAGNOSIS — I2510 Atherosclerotic heart disease of native coronary artery without angina pectoris: Secondary | ICD-10-CM | POA: Diagnosis not present

## 2020-11-27 DIAGNOSIS — J45909 Unspecified asthma, uncomplicated: Secondary | ICD-10-CM | POA: Diagnosis not present

## 2020-11-27 DIAGNOSIS — I10 Essential (primary) hypertension: Secondary | ICD-10-CM | POA: Diagnosis present

## 2020-11-27 DIAGNOSIS — I639 Cerebral infarction, unspecified: Secondary | ICD-10-CM

## 2020-11-27 DIAGNOSIS — R110 Nausea: Secondary | ICD-10-CM

## 2020-11-27 HISTORY — DX: Cerebral infarction, unspecified: I63.9

## 2020-11-27 LAB — CBC
HCT: 41.5 % (ref 36.0–46.0)
Hemoglobin: 14.5 g/dL (ref 12.0–15.0)
MCH: 27.9 pg (ref 26.0–34.0)
MCHC: 34.9 g/dL (ref 30.0–36.0)
MCV: 79.8 fL — ABNORMAL LOW (ref 80.0–100.0)
Platelets: 311 10*3/uL (ref 150–400)
RBC: 5.2 MIL/uL — ABNORMAL HIGH (ref 3.87–5.11)
RDW: 13.7 % (ref 11.5–15.5)
WBC: 8.9 10*3/uL (ref 4.0–10.5)
nRBC: 0 % (ref 0.0–0.2)

## 2020-11-27 LAB — CBG MONITORING, ED: Glucose-Capillary: 139 mg/dL — ABNORMAL HIGH (ref 70–99)

## 2020-11-27 LAB — BASIC METABOLIC PANEL
Anion gap: 11 (ref 5–15)
BUN: 8 mg/dL (ref 8–23)
CO2: 26 mmol/L (ref 22–32)
Calcium: 8.9 mg/dL (ref 8.9–10.3)
Chloride: 99 mmol/L (ref 98–111)
Creatinine, Ser: 0.62 mg/dL (ref 0.44–1.00)
GFR, Estimated: >60 mL/min (ref >60)
Glucose, Bld: 168 mg/dL — ABNORMAL HIGH (ref 70–99)
Potassium: 2.8 mmol/L — ABNORMAL LOW (ref 3.5–5.1)
Sodium: 136 mmol/L (ref 135–145)

## 2020-11-27 LAB — PROTIME-INR
INR: 0.9 (ref 0.8–1.2)
Prothrombin Time: 12.5 seconds (ref 11.4–15.2)

## 2020-11-27 LAB — TROPONIN I (HIGH SENSITIVITY): Troponin I (High Sensitivity): 3 ng/L (ref <18)

## 2020-11-27 IMAGING — DX DG CHEST 1V PORT
1 series · 1 of 1 positions shown · non-contrast
Comparison: [DATE]

CLINICAL DATA: Recent syncopal episode, initial encounter

EXAM:
PORTABLE CHEST 1 VIEW

[chest ap grid]
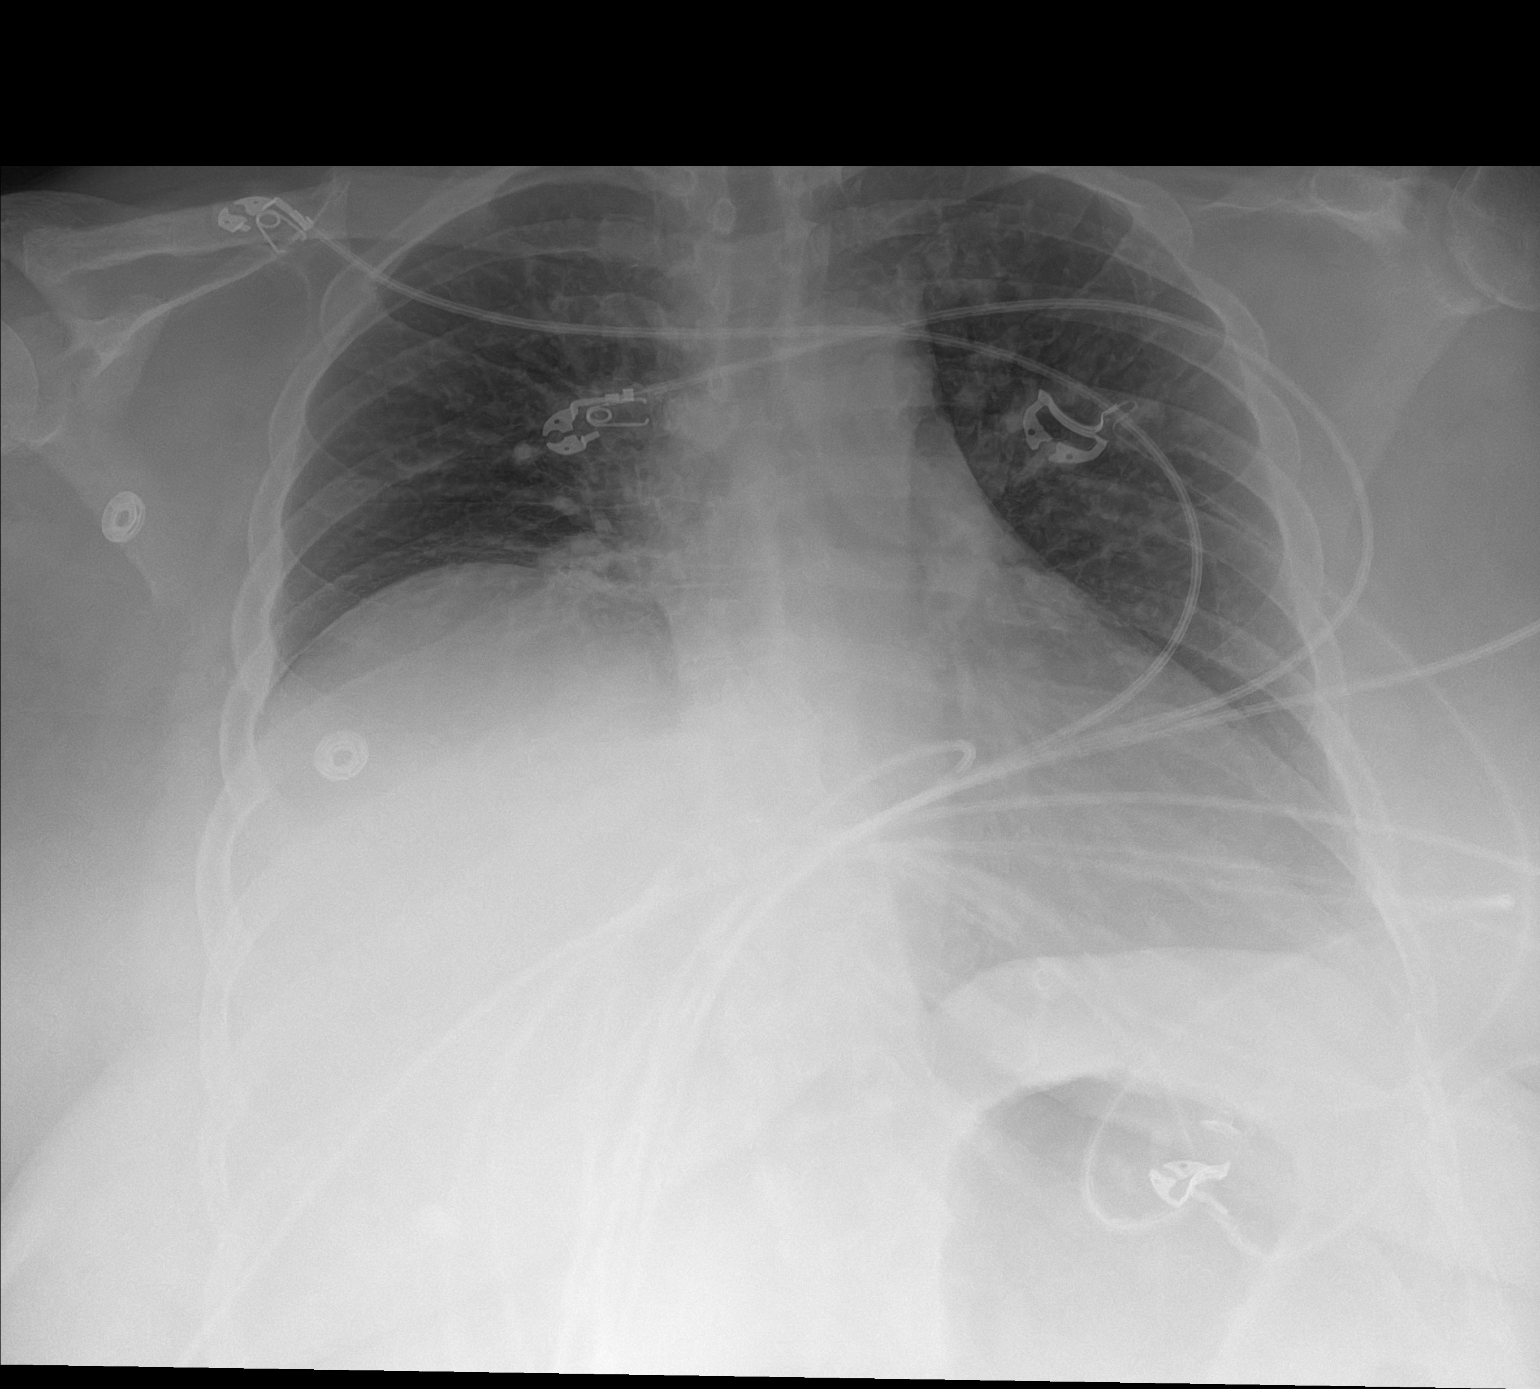

[1 of 1 positions shown; findings below may reference images not displayed]

FINDINGS: Cardiac shadow is stable. Elevation of the right hemidiaphragm is
again seen. The lungs are well aerated bilaterally. No acute bony
abnormality is noted.
IMPRESSION: No acute abnormality seen.

## 2020-11-27 IMAGING — CT CT HEAD W/O CM
3 series · 16 of 47 positions shown, 19 images · non-contrast
Comparison: None.

CLINICAL DATA: Left facial numbness and syncopal episodes with
altered mental status

EXAM:
CT HEAD WITHOUT CONTRAST
TECHNIQUE: Contiguous axial images were obtained from the base of the skull
through the vertex without intravenous contrast.

[Series 2: head w o · axial · 0.42mm/px · z∈[+12,+137]mm · 10 of 31 slices shown, 13 images]
[im 3/31  brain]
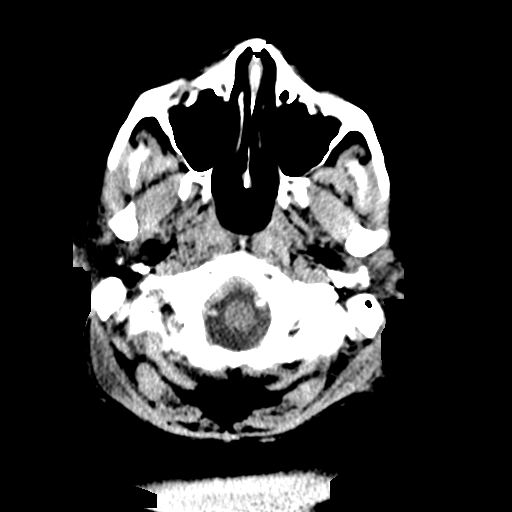
[im 3/31  bone]
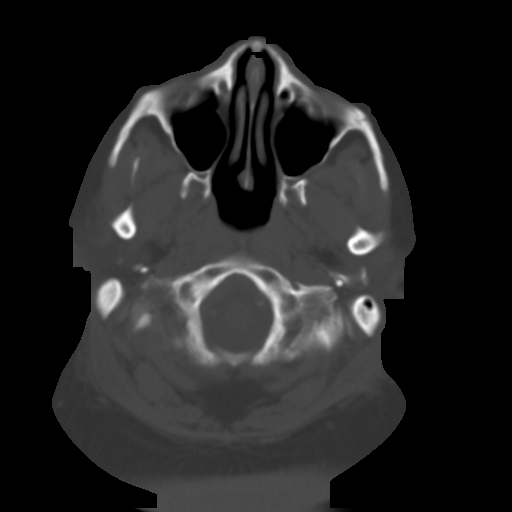
[im 6/31  brain]
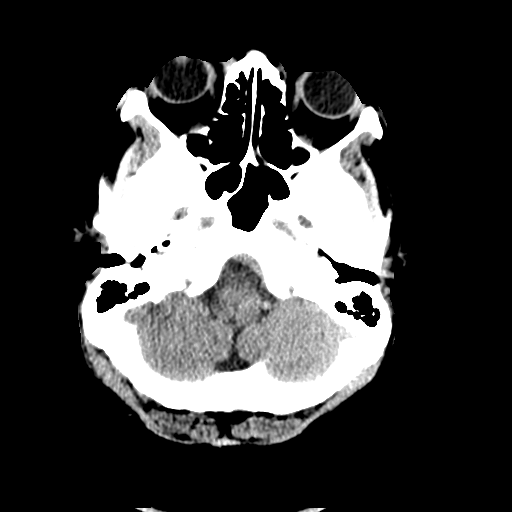
[im 9/31  brain]
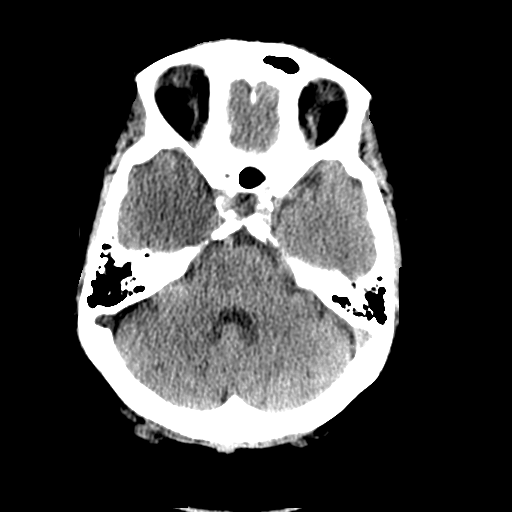
[im 11/31  brain]
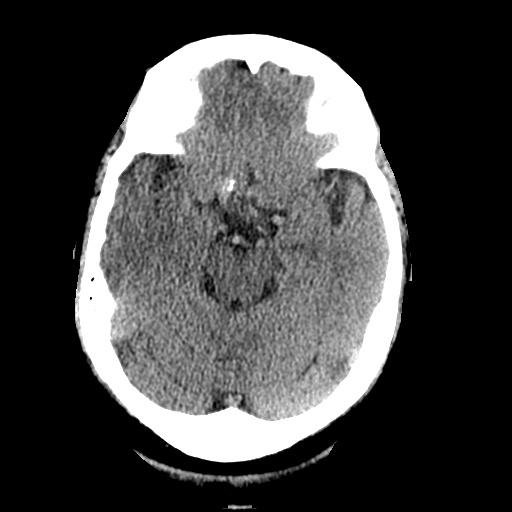
[im 14/31  brain]
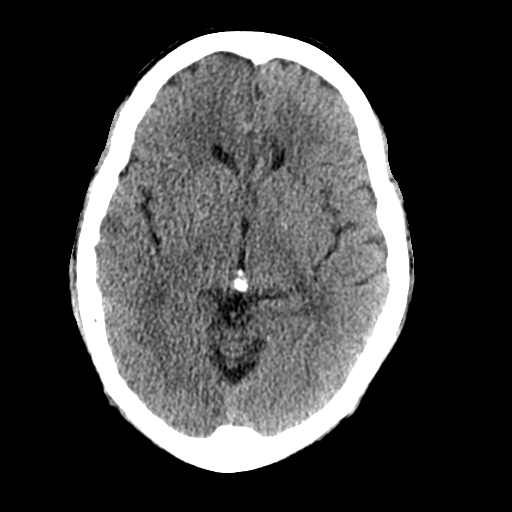
[im 14/31  bone]
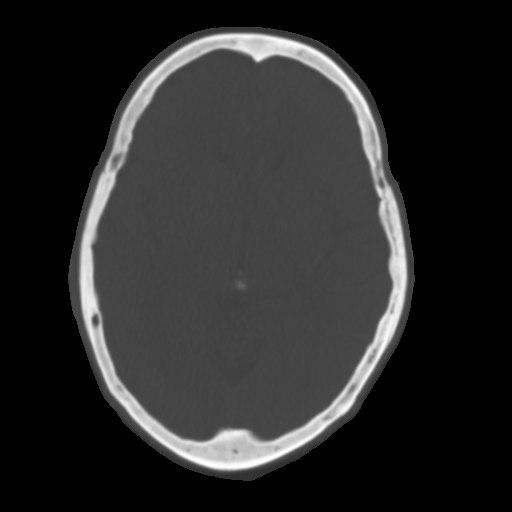
[im 17/31  brain]
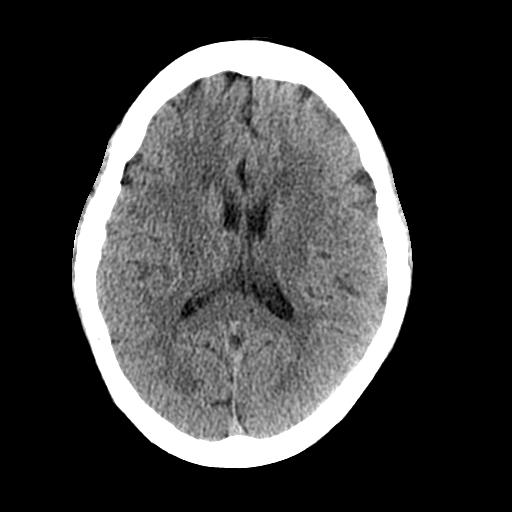
[im 20/31  brain]
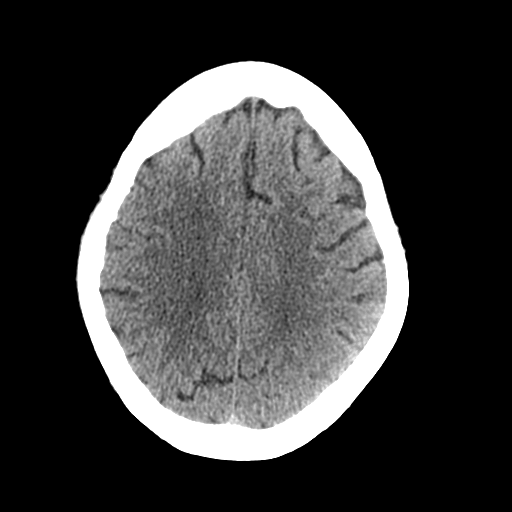
[im 23/31  brain]
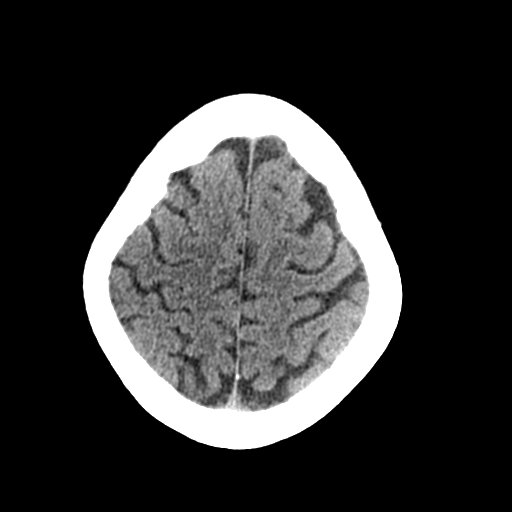
[im 25/31  brain]
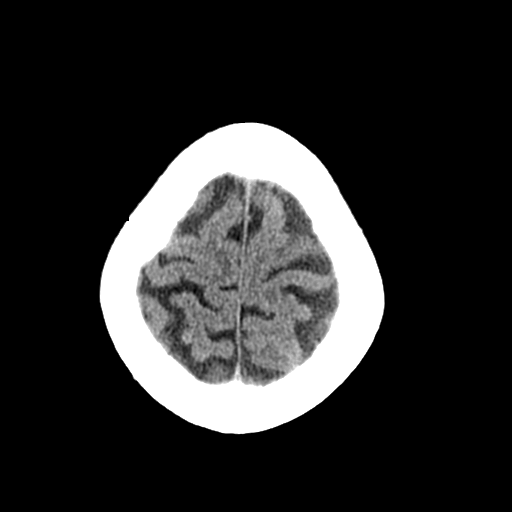
[im 25/31  bone]
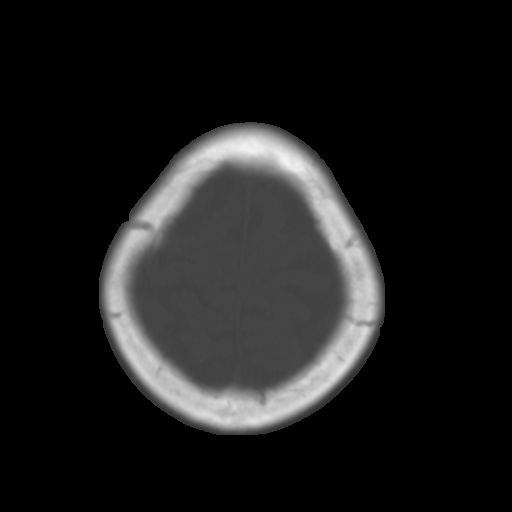
[im 28/31  brain]
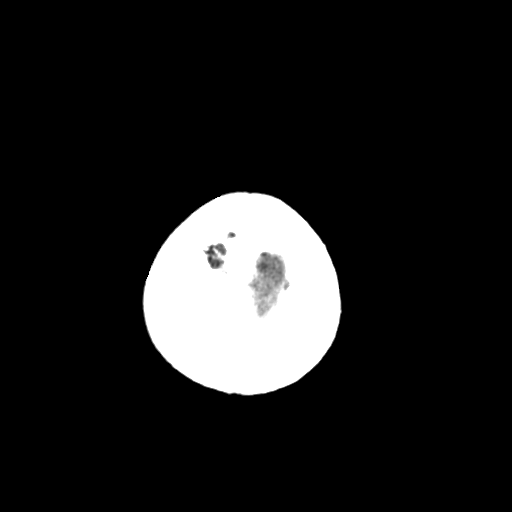

[Series 4: coronal soft · coronal · 0.32mm/px · 3 of 65 slices shown]
[im 22/65  brain]
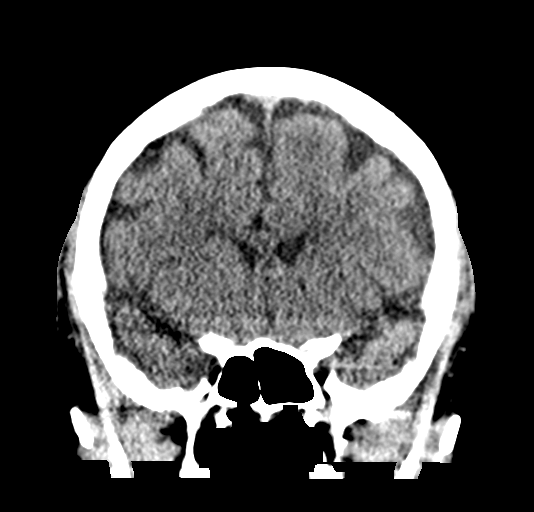
[im 29/65  brain]
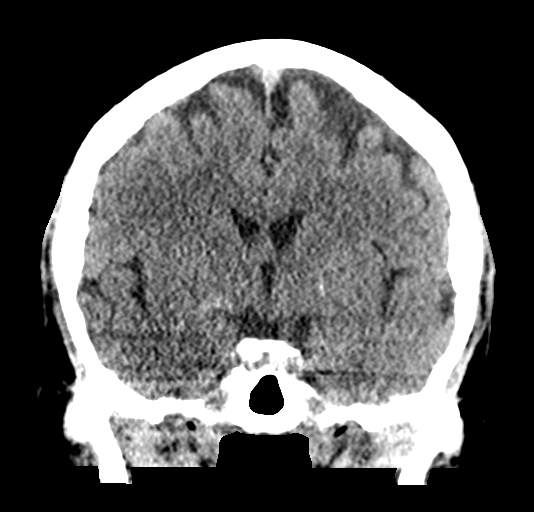
[im 36/65  brain]
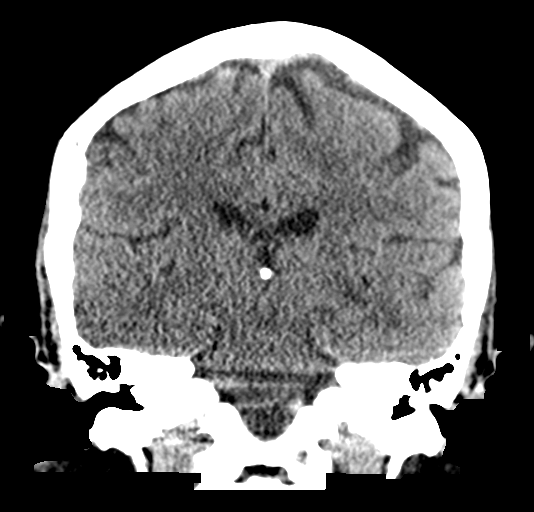

[Series 5: sagittal soft · sagittal · 0.31mm/px · 3 of 55 slices shown]
[im 19/55  brain]
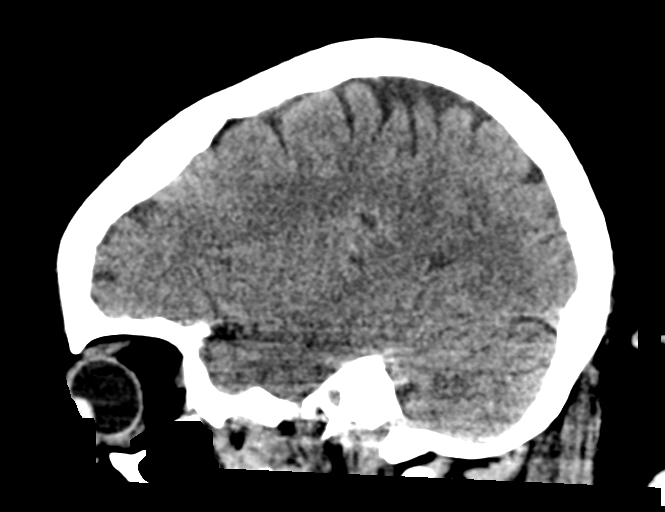
[im 28/55  brain]
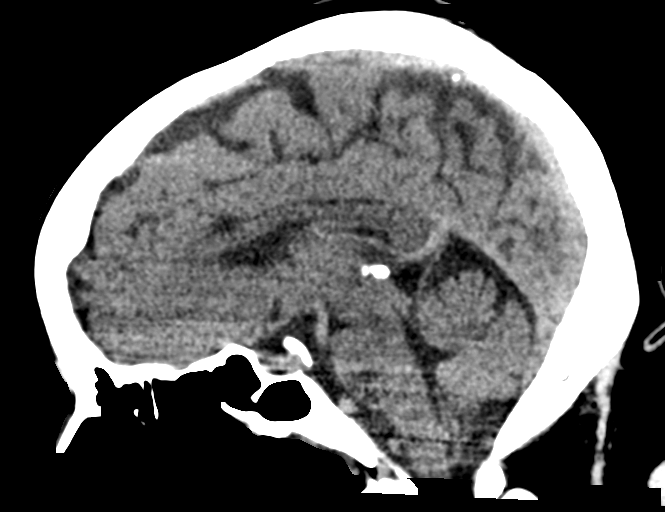
[im 37/55  brain]
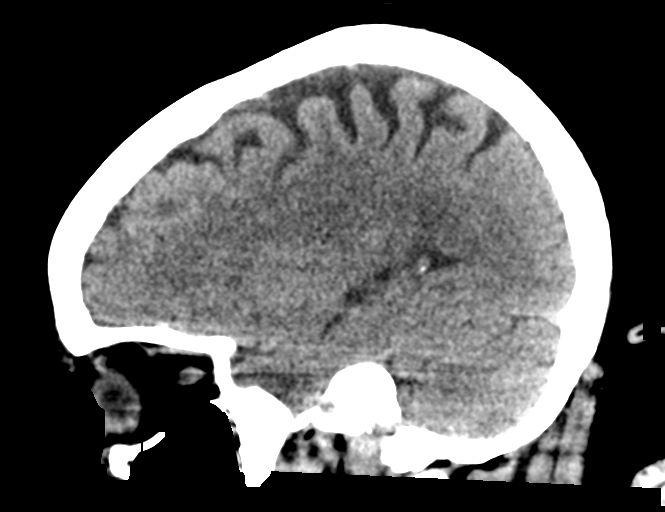

[16 of 47 positions shown; findings below may reference images not displayed]

FINDINGS: Brain: No evidence of acute infarction, hemorrhage, hydrocephalus,
extra-axial collection or mass lesion/mass effect.

Vascular: No hyperdense vessel or unexpected calcification.

Skull: Normal. Negative for fracture or focal lesion.

Sinuses/Orbits: No acute finding.

Other: None.
IMPRESSION: No acute intracranial abnormality noted.

## 2020-11-27 MED ORDER — POTASSIUM CHLORIDE 10 MEQ/100ML IV SOLN
10.0000 meq | INTRAVENOUS | Status: AC
  Administered 2020-11-28 (×3): 10 meq via INTRAVENOUS
  Filled 2020-11-27 (×3): qty 100

## 2020-11-27 MED ORDER — ONDANSETRON HCL 4 MG/2ML IJ SOLN
4.0000 mg | Freq: Once | INTRAMUSCULAR | Status: AC
  Administered 2020-11-28: 4 mg via INTRAVENOUS
  Filled 2020-11-27: qty 2

## 2020-11-27 MED ORDER — MAGNESIUM SULFATE 2 GM/50ML IV SOLN
2.0000 g | Freq: Once | INTRAVENOUS | Status: AC
  Administered 2020-11-28: 2 g via INTRAVENOUS
  Filled 2020-11-27: qty 50

## 2020-11-27 MED ORDER — DILTIAZEM HCL-DEXTROSE 125-5 MG/125ML-% IV SOLN (PREMIX)
5.0000 mg/h | INTRAVENOUS | Status: DC
Start: 2020-11-27 — End: 2020-11-28
  Administered 2020-11-27: 5 mg/h via INTRAVENOUS
  Administered 2020-11-28: 12.5 mg/h via INTRAVENOUS
  Filled 2020-11-27 (×2): qty 125

## 2020-11-27 MED ORDER — POTASSIUM CHLORIDE CRYS ER 20 MEQ PO TBCR
40.0000 meq | EXTENDED_RELEASE_TABLET | Freq: Once | ORAL | Status: AC
  Administered 2020-11-28: 40 meq via ORAL
  Filled 2020-11-27: qty 2

## 2020-11-27 MED ORDER — ONDANSETRON 4 MG PO TBDP
4.0000 mg | ORAL_TABLET | Freq: Once | ORAL | Status: AC
  Administered 2020-11-27: 4 mg via ORAL
  Filled 2020-11-27: qty 1

## 2020-11-27 MED ORDER — DILTIAZEM LOAD VIA INFUSION
10.0000 mg | Freq: Once | INTRAVENOUS | Status: AC
  Administered 2020-11-27: 10 mg via INTRAVENOUS
  Filled 2020-11-27: qty 10

## 2020-11-27 NOTE — ED Provider Notes (Signed)
[facility] EMERGENCY DEPARTMENT Provider Note   CSN: [id number] Arrival date & time: 11/27/20  2026     History Chief Complaint  Patient presents with   Hypertension    [name] is a 65 y.o. female.   Hypertension   This patient is a very pleasant 65 year old female presenting from home because of a complaint of hypertension.  The patient reports to me that she has had a headache for couple of days, she noticed today while she was at her psychiatrist office that in addition to the headache she was having a tingling on the left side of her face that went in the periorbital region, the perioral region and across the nasal bridge but mostly on the left side of the face.  She also noted some numbness on the right side of her face.  She had no weakness or numbness of the arms of the legs.  As an aside the patient states that she feels occasional palpitations which are very short-lived and are not currently present.  She has no fevers or chills, no nausea vomiting or diarrhea, she has not started on any new medications recently.  She has not been coughing.  She has no prior history of stroke.  She has not taken any medications for this and has not been evaluated by a medical provider prior to my exam for these complaints.  She is not currently having any symptoms at all except for a mild headache  Past Medical History:  Diagnosis Date   Arthritis    Asthma    CHF (congestive heart failure) (HCC)    Coronary artery disease    Diabetes mellitus    Diabetic neuropathy (HCC)    GERD (gastroesophageal reflux disease)    History of hiatal hernia    Hx of cardiovascular stress test    Lexiscan Myoview (10/15):  Medium area of ischemia in AL and IL distribution, cannot completely exclude shifting breast attenuation, EF 64%; Abnormal study   Hyperlipidemia    Hypertension    Morbid obesity ([place])    Sleep apnea    does not wear CPAP    Patient Active Problem List   Diagnosis  Date Noted   Coronary atherosclerosis of native coronary artery 07/17/2013   Mixed hyperlipidemia 07/17/2013   Essential hypertension, benign 07/17/2013   Obesity 08/11/2011    Past Surgical History:  Procedure Laterality Date   [place]   right   COLONOSCOPY     DILATION AND CURETTAGE OF UTERUS     HAND SURGERY     right   heart stent  2007   PARS PLANA VITRECTOMY Left 03/24/2017   Procedure: PARS PLANA VITRECTOMY WITH 25 GAUGE;  Surgeon: Hurman Horn, MD;  Location: [place];  Service: Ophthalmology;  Laterality: Left;   UTERINE FIBROID SURGERY  2008     OB History   No obstetric history on file.     Family History  Problem Relation Age of Onset   Kidney disease Father    Stroke Father    Heart attack Father    Stroke Maternal Grandfather    Stroke Paternal Grandfather    Asthma Other    Hypertension Other    Diabetes Other     Social History   Tobacco Use   Smoking status: Former    Types: Cigarettes    Quit date: 08/07/1982    Years since quitting: 38.3   Smokeless tobacco: Never  Vaping Use  Vaping Use: Never used  Substance Use Topics   Alcohol use: No   Drug use: No    Home Medications Prior to Admission medications   Medication Sig Start Date End Date Taking? Authorizing Provider  albuterol (PROVENTIL HFA;VENTOLIN HFA) 108 (90 Base) MCG/ACT inhaler Inhale 1-2 puffs into the lungs every 6 (six) hours as needed for wheezing or shortness of breath. 05/24/17  Yes Long, Wonda Olds, MD  albuterol (PROVENTIL) (2.5 MG/3ML) 0.083% nebulizer solution Take 3 mLs (2.5 mg total) by nebulization every 6 (six) hours as needed for wheezing or shortness of breath. 05/28/20  Yes Faustino Congress, NP  ALPRAZolam Duanne Moron) 0.5 MG tablet Take 0.5 mg by mouth 2 (two) times daily as needed. 11/21/20  Yes [provider]  amLODipine (NORVASC) 10 MG tablet Take 1 tablet by mouth once daily 03/04/20  Yes Jettie Booze, MD  aspirin EC 81 MG  tablet Take 1 tablet (81 mg total) by mouth daily. 03/03/19  Yes Varanasi, Charlann Lange, MD  BREO ELLIPTA 100-25 MCG/INH AEPB Inhale 1 puff into the lungs daily. 09/21/16  Yes [provider]  carvedilol (COREG) 6.25 MG tablet Take 1 tablet by mouth twice daily 03/04/20  Yes Jettie Booze, MD  dexlansoprazole (DEXILANT) 60 MG capsule Take 1 capsule (60 mg total) by mouth daily. 03/03/19  Yes Jettie Booze, MD  escitalopram (LEXAPRO) 5 MG tablet Take 5 mg by mouth daily. 11/22/20  Yes [provider]  furosemide (LASIX) 40 MG tablet Take 2 tablets by mouth once daily 03/04/20  Yes Jettie Booze, MD  HUMULIN R 500 UNIT/ML SOLN injection Inject 80-110 Units into the skin 2 (two) times daily with a meal. 80 units in the morning and 40 every evening. Sliding Scale. 12/25/13  Yes [provider]  lisinopril (ZESTRIL) 40 MG tablet Take 1 tablet by mouth once daily 03/04/20  Yes Jettie Booze, MD  nitroGLYCERIN (NITROSTAT) 0.4 MG SL tablet Place 1 tablet (0.4 mg total) under the tongue every 5 (five) minutes as needed for chest pain (X 3 DOSES FOR CHEST PAIN). 11/30/17  Yes Jettie Booze, MD  potassium chloride (KLOR-CON) 10 MEQ tablet Take 2 tablets (20 mEq total) by mouth daily. Patient taking differently: Take 20 mEq by mouth 2 (two) times daily. 03/04/20  Yes Jettie Booze, MD  rosuvastatin (CRESTOR) 40 MG tablet Take 1 tablet by mouth once daily 03/04/20  Yes Jettie Booze, MD  TRULICITY 1.5 DU/2.0UR SOPN Inject 1.5 mg into the skin every 7 (seven) days. 09/30/20  Yes [provider]  benzonatate (TESSALON) 200 MG capsule Take 400 mg by mouth every 8 (eight) hours as needed. Patient not taking: No sig reported 06/10/20   [provider]  clopidogrel (PLAVIX) 75 MG tablet Take 1 tablet by mouth once daily Patient not taking: Reported on 11/27/2020 03/04/20   Jettie Booze, MD  Dulaglutide 0.75 MG/0.5ML SOPN Inject  0.75 mg into the skin every Tuesday.    [provider]  FREESTYLE LITE test strip 1 each by Other route as directed.  07/08/11   [provider]  guaiFENesin-codeine (ROBITUSSIN AC) 100-10 MG/5ML syrup Take 5 mLs by mouth 3 (three) times daily as needed for cough. Patient not taking: Reported on 11/27/2020 05/28/20   Faustino Congress, NP  HUMULIN R U-500 KWIKPEN 500 UNIT/ML KwikPen Inject into the skin. Patient not taking: Reported on 11/27/2020 09/30/20   [provider]  HYDROcodone-acetaminophen (NORCO/VICODIN) 5-325 MG tablet Take  1 tablet by mouth every 4 (four) hours as needed. Patient not taking: Reported on 11/27/2020 08/21/20   [provider]  ondansetron (ZOFRAN-ODT) 8 MG disintegrating tablet Take 1 tablet (8 mg total) by mouth every 8 (eight) hours as needed for nausea or vomiting. 05/12/20   Scot Jun, FNP    Allergies    Bystolic [nebivolol hcl], Crestor [rosuvastatin], Sulfa antibiotics, Metformin and related, and Other  Review of Systems   Review of Systems  All other systems reviewed and are negative.  Physical Exam Updated Vital Signs BP (!) 142/95   Pulse (!) 128   Temp 98.1 F (36.7 C) (Oral)   Resp 18   Ht 1.575 m (5\' 2" )   Wt 127 kg   LMP  (LMP Unknown)   SpO2 93%   BMI 51.21 kg/m   Physical Exam Vitals and nursing note reviewed.  Constitutional:      General: She is not in acute distress.    Appearance: She is well-developed.  HENT:     Head: Normocephalic and atraumatic.     Mouth/Throat:     Pharynx: No oropharyngeal exudate.  Eyes:     General: No scleral icterus.       Right eye: No discharge.        Left eye: No discharge.     Conjunctiva/sclera: Conjunctivae normal.     Pupils: Pupils are equal, round, and reactive to light.  Neck:     Thyroid: No thyromegaly.     Vascular: No JVD.  Cardiovascular:     Rate and Rhythm: Normal rate and regular rhythm.     Heart sounds: Normal heart sounds. No  murmur heard.   No friction rub. No gallop.  Pulmonary:     Effort: Pulmonary effort is normal. No respiratory distress.     Breath sounds: Normal breath sounds. No wheezing or rales.  Abdominal:     General: Bowel sounds are normal. There is no distension.     Palpations: Abdomen is soft. There is no mass.     Tenderness: There is no abdominal tenderness.  Musculoskeletal:        General: No tenderness. Normal range of motion.     Cervical back: Normal range of motion and neck supple.  Lymphadenopathy:     Cervical: No cervical adenopathy.  Skin:    General: Skin is warm and dry.     Findings: No erythema or rash.  Neurological:     Mental Status: She is alert.     Coordination: Coordination normal.     Comments: Speech is clear, cranial nerves III through XII are intact, memory is intact, strength is normal in all 4 extremities including grips, sensation is intact to light touch and pinprick in all 4 extremities. Coordination as tested by finger-nose-finger is normal, no limb ataxia. Normal gait, normal reflexes at the patellar tendons bilaterally  Psychiatric:        Behavior: Behavior normal.    ED Results / Procedures / Treatments   Labs (all labs ordered are listed, but only abnormal results are displayed) Labs Reviewed  CBC - Abnormal; Notable for the following components:      Result Value   RBC 5.20 (*)    MCV 79.8 (*)    All other components within normal limits  CBG MONITORING, ED - Abnormal; Notable for the following components:   Glucose-Capillary 139 (*)    All other components within normal limits  BASIC METABOLIC PANEL  PROTIME-INR  TROPONIN I (HIGH SENSITIVITY)    EKG EKG Interpretation  Date/Time:  Wednesday November 27 2020 21:26:50 EDT Ventricular Rate:  91 PR Interval:  153 QRS Duration: 98 QT Interval:  451 QTC Calculation: 494 R Axis:   3 Text Interpretation: Sinus rhythm Supraventricular bigeminy Low voltage, precordial leads Borderline  prolonged QT interval Confirmed by Noemi Chapel [phone]) on 11/27/2020 10:51:13 PM        EKG Interpretation  Date/Time:  Wednesday November 27 2020 21:36:46 EDT Ventricular Rate:  108 PR Interval:  153 QRS Duration: 101 QT Interval:  364 QTC Calculation: 488 R Axis:   13 Text Interpretation: Atrial fibrillation Low voltage, precordial leads Borderline repolarization abnormality Borderline prolonged QT interval this is new onset afib Confirmed by Noemi Chapel [phone]) on 11/27/2020 11:00:41 PM          Radiology CT Head Wo Contrast  Result Date: 11/27/2020 CLINICAL DATA:  Left facial numbness and syncopal episodes with altered mental status EXAM: CT HEAD WITHOUT CONTRAST TECHNIQUE: Contiguous axial images were obtained from the base of the skull through the vertex without intravenous contrast. COMPARISON:  None. FINDINGS: Brain: No evidence of acute infarction, hemorrhage, hydrocephalus, extra-axial collection or mass lesion/mass effect. Vascular: No hyperdense vessel or unexpected calcification. Skull: Normal. Negative for fracture or focal lesion. Sinuses/Orbits: No acute finding. Other: None. IMPRESSION: No acute intracranial abnormality noted. Electronically Signed   By: Inez Catalina M.D.   On: 11/27/2020 22:42    Procedures .Critical Care Performed by: Noemi Chapel, MD Authorized by: Noemi Chapel, MD   Critical care provider statement:    Critical care time (minutes):  45   Critical care time was exclusive of:  Separately billable procedures and treating other patients   Critical care was necessary to treat or prevent imminent or life-threatening deterioration of the following conditions:  Cardiac failure and circulatory failure   Critical care was time spent personally by me on the following activities:  Development of treatment plan with patient or surrogate, discussions with consultants, evaluation of patient's response to treatment, examination of patient, obtaining  history from patient or surrogate, review of old charts, re-evaluation of patient's condition, pulse oximetry, ordering and review of radiographic studies, ordering and review of laboratory studies and ordering and performing treatments and interventions   Care discussed with: admitting provider     Medications Ordered in ED Medications  diltiazem (CARDIZEM) 1 mg/mL load via infusion 10 mg (has no administration in time range)    And  diltiazem (CARDIZEM) 125 mg in dextrose 5% 125 mL (1 mg/mL) infusion (has no administration in time range)    ED Course  I have reviewed the triage vital signs and the nursing notes.  Pertinent labs & imaging results that were available during my care of the patient were reviewed by me and considered in my medical decision making (see chart for details).    MDM Rules/Calculators/A&P                           The patient is in fact hypertensive measured at 192/91 on arrival.  Her exam is completely normal including finger-nose-finger coordination no pronator drift normal strength normal cranial nerves III through XII.  Diffusely normal sensation and mental status.  Answering questions appropriately.  Cardiac exam is unremarkable.  We will check for electrolytes make sure she is not anemic and check a CT scan of the brain.  The patient is agreeable to the plan.  She states that she lost her mother about a year and a half ago and is dealing with complex grief syndrome, this is why she was at the psychiatrist.  May just be related to anxiety but will rule out organic pathology first  Over the course of the patient's stay she initially stated that she was feeling better however as phlebotomy came in to draw her blood she was feeling nauseated and lightheaded and had a syncopal episode in front of several nurses a phlebotomist and the patient's family member during which time she was totally unconscious had urinary incontinence and came back around within 2 minutes back  to her normal mental status.  There was no seizure activity seen, I witnessed the patient when she was totally unresponsive and saw no tonic-clonic activity.  There was no tongue biting.  Since that time the patient has been in atrial fibrillation with rapid ventricular rate.  I did not see a pathological ventricular rhythm on the monitor during the syncopal episode.  The patient's recheck EKG that was performed at 9:36 PM does not fact confirm atrial fibrillation with rapid ventricular rate, she is currently between 130 and 150 bpm and requiring Cardizem with a Cardizem drip.  She is not currently anticoagulated but does take an antiplatelet agent in the form of clopidogrel.  The patient will be given Cardizem with a drip, she will be admitted to the hospital however at this time there is no laboratory data available as it is still pending.  Thankfully the CT scan shows no acute findings of stroke.  The patient is not having any chest pain but is actively nauseated.  Zofran will be given  At change of shift, care signed out to Dr. Sedonia Small oncoming emergency department physician to follow-up results and disposition accordingly anticipate admission  Final Clinical Impression(s) / ED Diagnoses Final diagnoses:  Primary hypertension  Atrial fibrillation with rapid ventricular response ([place])  Syncope, unspecified syncope type       Noemi Chapel, MD 11/27/20 2304

## 2020-11-27 NOTE — ED Triage Notes (Signed)
Pt c/o hypertension that she noticed today when her vision became blurry. Pt took 1 nitro that then gave her a headache.

## 2020-11-27 NOTE — ED Notes (Signed)
Pt. Had a syncopal episode. Dr. At bedside.

## 2020-11-27 NOTE — ED Notes (Signed)
Attempted to call [name] [phone]. Phone went to voicemail.

## 2020-11-27 NOTE — ED Provider Notes (Signed)
  Provider Note MRN:  [id number]  Arrival date & time: 12/03/20    ED Course and Medical Decision Making  Assumed care from Dr. Sabra Heck at shift change.  Paresthesias to the face earlier today prompting ED visit.  Syncopal episode here in the emergency department and patient is now in A. fib with RVR, requiring diltiazem drip.  Awaiting labs, anticipating admission.  .Critical Care Performed by: Maudie Flakes, MD Authorized by: Maudie Flakes, MD   Critical care provider statement:    Critical care time (minutes):  32   Critical care time was exclusive of:  Separately billable procedures and treating other patients   Critical care was necessary to treat or prevent imminent or life-threatening deterioration of the following conditions: A. fib with RVR.   Critical care was time spent personally by me on the following activities:  Examination of patient, evaluation of patient's response to treatment, discussions with consultants, ordering and review of laboratory studies and ordering and review of radiographic studies  Final Clinical Impressions(s) / ED Diagnoses     ICD-10-CM   1. Primary hypertension  I10     2. Atrial fibrillation with rapid ventricular response (HCC)  I48.91     3. Syncope, unspecified syncope type  R55     4. Syncope  R55 US Carotid Bilateral    US Carotid Bilateral    5. Hyperglycemia due to diabetes mellitus (HCC)  E11.65 Diet Carb Modified    6. Syncope and collapse  R55 Ambulatory referral to Neurology         Discharge Instructions   None     Barth Kirks. Sedonia Small, [place] [email or web address]    Maudie Flakes, MD 12/03/20 [phone]

## 2020-11-28 ENCOUNTER — Observation Stay (HOSPITAL_BASED_OUTPATIENT_CLINIC_OR_DEPARTMENT_OTHER): Payer: 59

## 2020-11-28 ENCOUNTER — Observation Stay (HOSPITAL_COMMUNITY): Payer: 59

## 2020-11-28 DIAGNOSIS — R110 Nausea: Secondary | ICD-10-CM

## 2020-11-28 DIAGNOSIS — E876 Hypokalemia: Secondary | ICD-10-CM

## 2020-11-28 DIAGNOSIS — I10 Essential (primary) hypertension: Secondary | ICD-10-CM | POA: Diagnosis not present

## 2020-11-28 DIAGNOSIS — I160 Hypertensive urgency: Secondary | ICD-10-CM

## 2020-11-28 DIAGNOSIS — J45909 Unspecified asthma, uncomplicated: Secondary | ICD-10-CM

## 2020-11-28 DIAGNOSIS — E782 Mixed hyperlipidemia: Secondary | ICD-10-CM

## 2020-11-28 DIAGNOSIS — K219 Gastro-esophageal reflux disease without esophagitis: Secondary | ICD-10-CM

## 2020-11-28 DIAGNOSIS — R55 Syncope and collapse: Secondary | ICD-10-CM

## 2020-11-28 DIAGNOSIS — J4520 Mild intermittent asthma, uncomplicated: Secondary | ICD-10-CM | POA: Diagnosis not present

## 2020-11-28 DIAGNOSIS — E1165 Type 2 diabetes mellitus with hyperglycemia: Secondary | ICD-10-CM

## 2020-11-28 DIAGNOSIS — I480 Paroxysmal atrial fibrillation: Secondary | ICD-10-CM | POA: Diagnosis present

## 2020-11-28 LAB — RESP PANEL BY RT-PCR (FLU A&B, COVID) ARPGX2
Influenza A by PCR: NEGATIVE
Influenza B by PCR: NEGATIVE
SARS Coronavirus 2 by RT PCR: NEGATIVE

## 2020-11-28 LAB — COMPREHENSIVE METABOLIC PANEL
ALT: 13 U/L (ref 0–44)
AST: 15 U/L (ref 15–41)
Albumin: 3.7 g/dL (ref 3.5–5.0)
Alkaline Phosphatase: 90 U/L (ref 38–126)
Anion gap: 8 (ref 5–15)
BUN: 11 mg/dL (ref 8–23)
CO2: 26 mmol/L (ref 22–32)
Calcium: 8.8 mg/dL — ABNORMAL LOW (ref 8.9–10.3)
Chloride: 102 mmol/L (ref 98–111)
Creatinine, Ser: 0.75 mg/dL (ref 0.44–1.00)
GFR, Estimated: >60 mL/min (ref >60)
Glucose, Bld: 212 mg/dL — ABNORMAL HIGH (ref 70–99)
Potassium: 3.5 mmol/L (ref 3.5–5.1)
Sodium: 136 mmol/L (ref 135–145)
Total Bilirubin: 0.8 mg/dL (ref 0.3–1.2)
Total Protein: 7.7 g/dL (ref 6.5–8.1)

## 2020-11-28 LAB — ECHOCARDIOGRAM COMPLETE
AR max vel: 2.39 cm2
AV Area VTI: 2.16 cm2
AV Area mean vel: 2.31 cm2
AV Mean grad: 5 mmHg
AV Peak grad: 10.1 mmHg
Ao pk vel: 1.59 m/s
Area-P 1/2: 3.23 cm2
Calc EF: 63.6 %
Height: 62 in
MV VTI: 2.35 cm2
S' Lateral: 3 cm
Single Plane A2C EF: 55.4 %
Single Plane A4C EF: 70.7 %
Weight: 4480 oz

## 2020-11-28 LAB — CBC
HCT: 40.3 % (ref 36.0–46.0)
Hemoglobin: 14.1 g/dL (ref 12.0–15.0)
MCH: 28.1 pg (ref 26.0–34.0)
MCHC: 35 g/dL (ref 30.0–36.0)
MCV: 80.3 fL (ref 80.0–100.0)
Platelets: 321 10*3/uL (ref 150–400)
RBC: 5.02 MIL/uL (ref 3.87–5.11)
RDW: 14 % (ref 11.5–15.5)
WBC: 11.6 10*3/uL — ABNORMAL HIGH (ref 4.0–10.5)
nRBC: 0 % (ref 0.0–0.2)

## 2020-11-28 LAB — PROTIME-INR
INR: 1 (ref 0.8–1.2)
Prothrombin Time: 13.1 seconds (ref 11.4–15.2)

## 2020-11-28 LAB — CBG MONITORING, ED
Glucose-Capillary: 228 mg/dL — ABNORMAL HIGH (ref 70–99)
Glucose-Capillary: 215 mg/dL — ABNORMAL HIGH (ref 70–99)
Glucose-Capillary: 224 mg/dL — ABNORMAL HIGH (ref 70–99)
Glucose-Capillary: 151 mg/dL — ABNORMAL HIGH (ref 70–99)

## 2020-11-28 LAB — MAGNESIUM: Magnesium: 2.1 mg/dL (ref 1.7–2.4)

## 2020-11-28 LAB — HIV ANTIBODY (ROUTINE TESTING W REFLEX): HIV Screen 4th Generation wRfx: NONREACTIVE

## 2020-11-28 LAB — TSH: TSH: 3.797 u[IU]/mL (ref 0.350–4.500)

## 2020-11-28 LAB — HEMOGLOBIN A1C
Hgb A1c MFr Bld: 9 % — ABNORMAL HIGH (ref 4.8–5.6)
Mean Plasma Glucose: 212 mg/dL

## 2020-11-28 LAB — PHOSPHORUS: Phosphorus: 3.2 mg/dL (ref 2.5–4.6)

## 2020-11-28 LAB — APTT: aPTT: 25 seconds (ref 24–36)

## 2020-11-28 IMAGING — US US CAROTID DUPLEX BILAT
1 series · 13 of 24 positions shown · non-contrast
Comparison: None.

CLINICAL DATA: Hypertension, syncope, hyperlipidemia

EXAM:
BILATERAL CAROTID DUPLEX ULTRASOUND
TECHNIQUE: Gray scale imaging, color Doppler and duplex ultrasound were
performed of bilateral carotid and vertebral arteries in the neck.

[Series 1: us carotid bilateral · 13 of 69 slices shown]
[im 1/69]
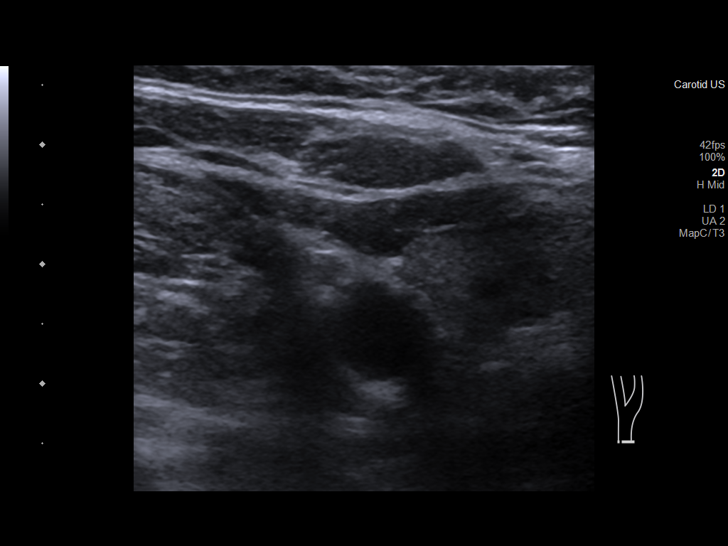
[im 6/69]
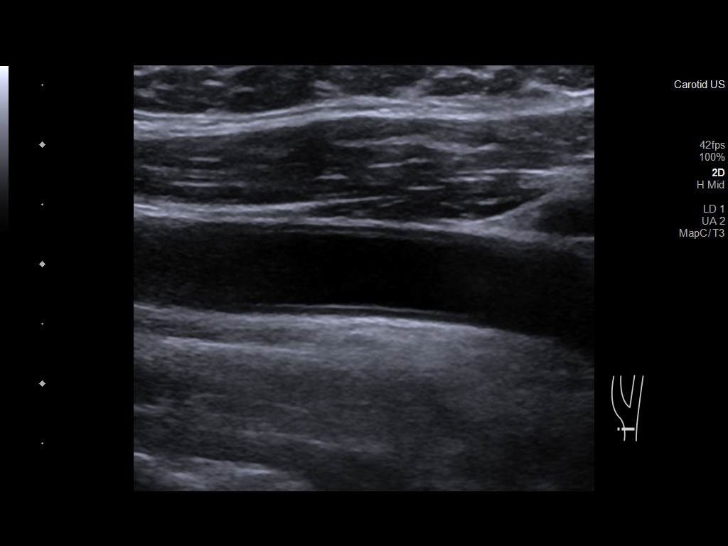
[im 12/69]
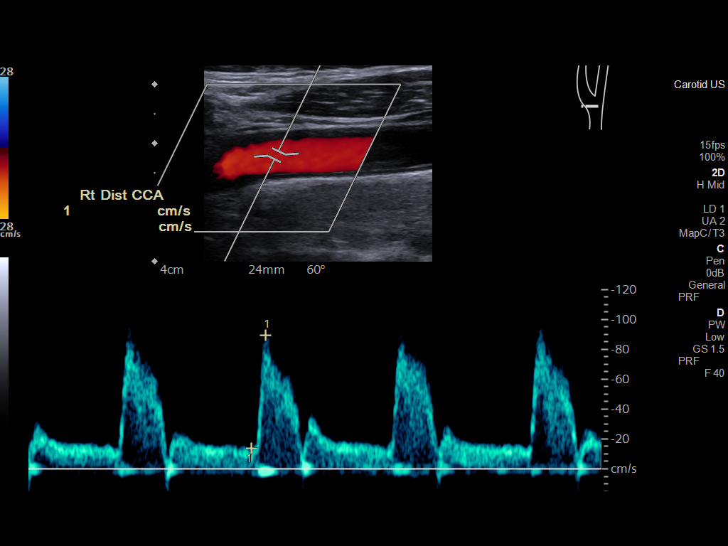
[im 18/69]
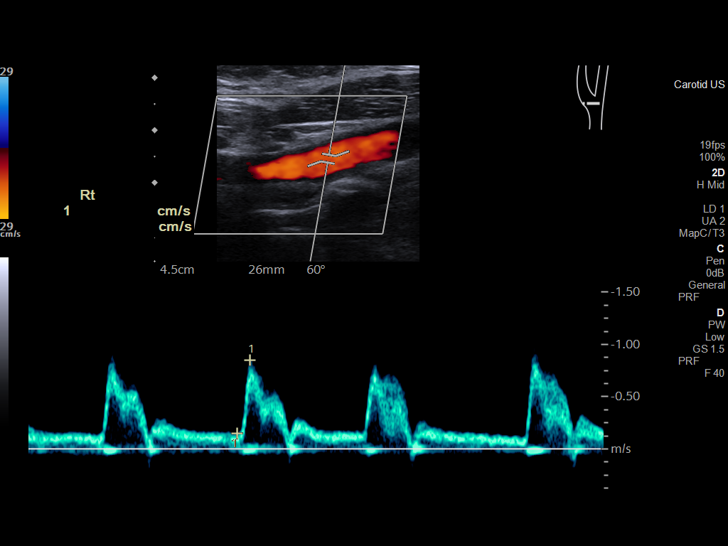
[im 24/69]
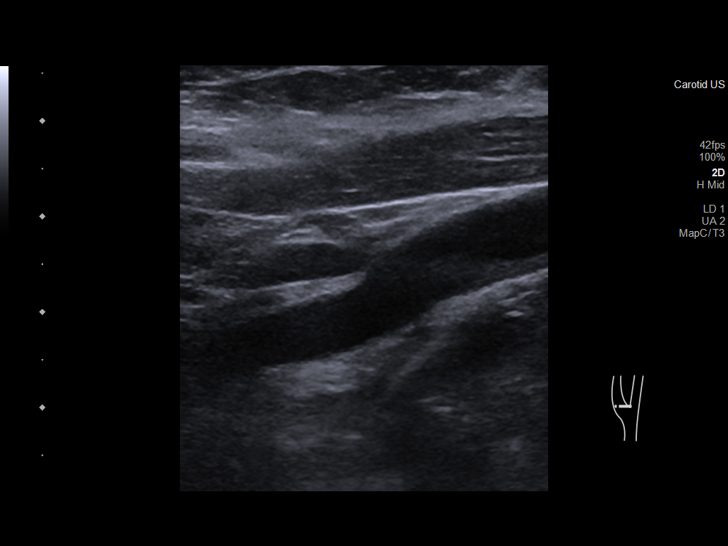
[im 30/69]
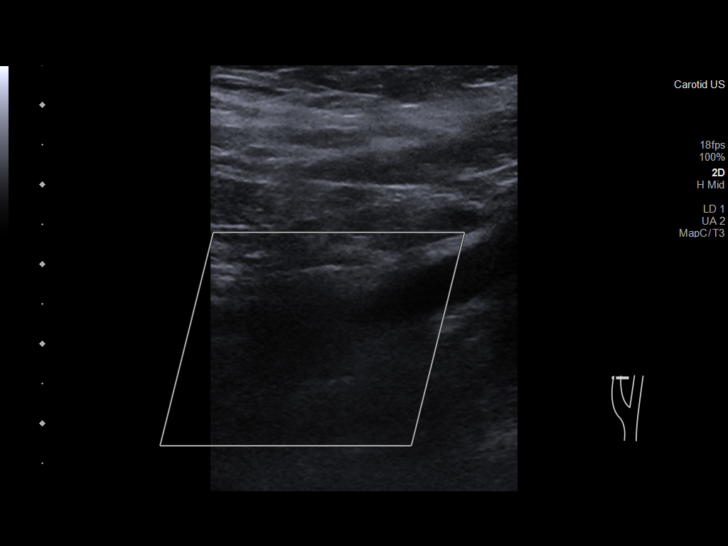
[im 36/69]
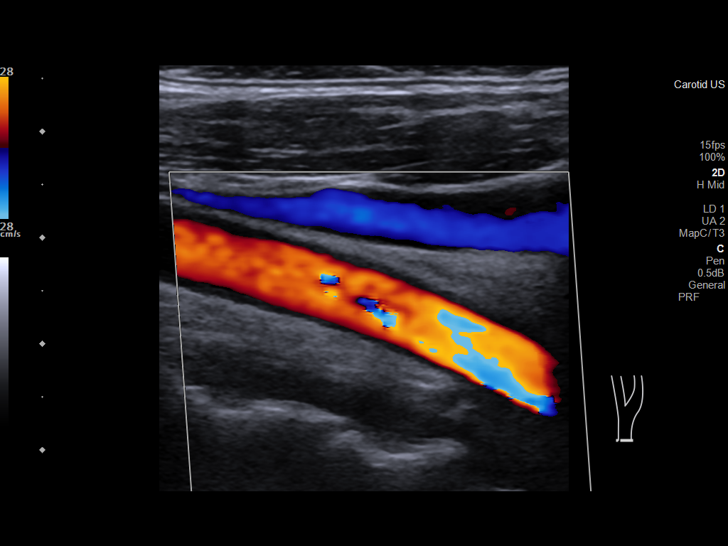
[im 39/69]
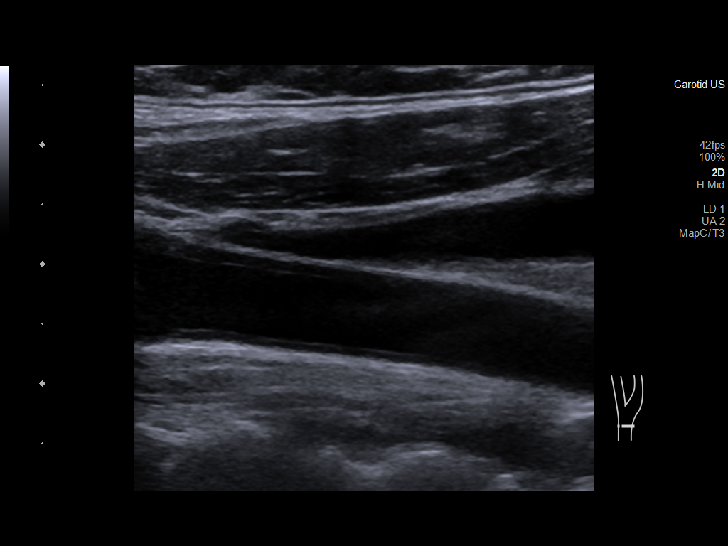
[im 45/69]
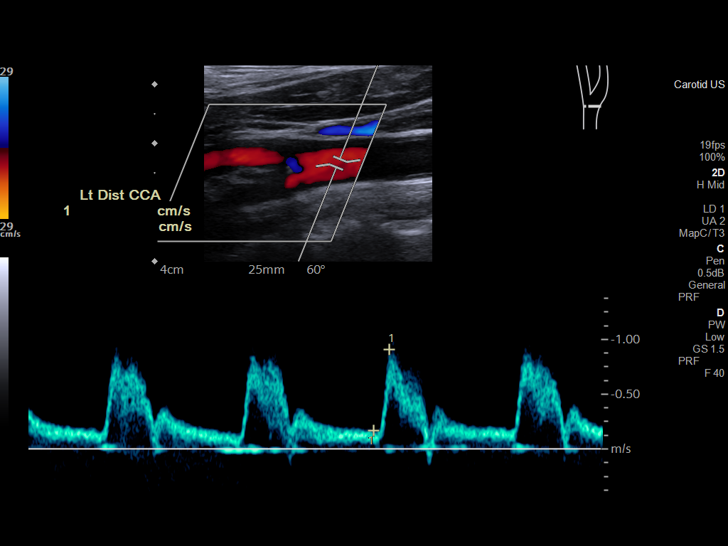
[im 51/69]
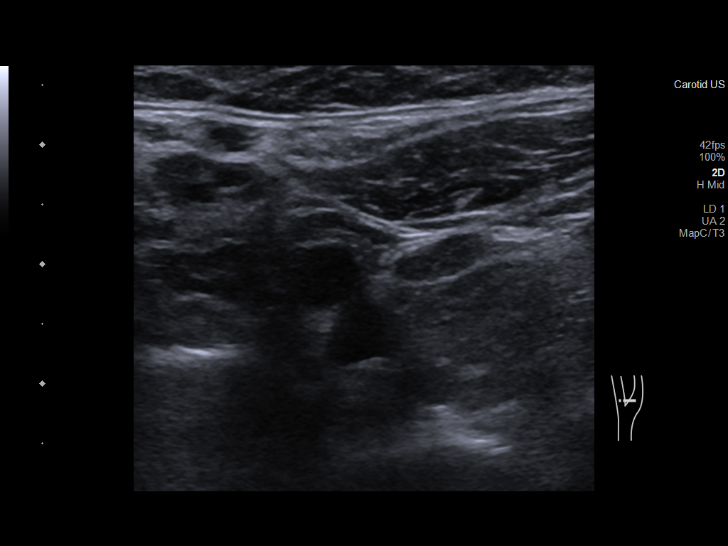
[im 57/69]
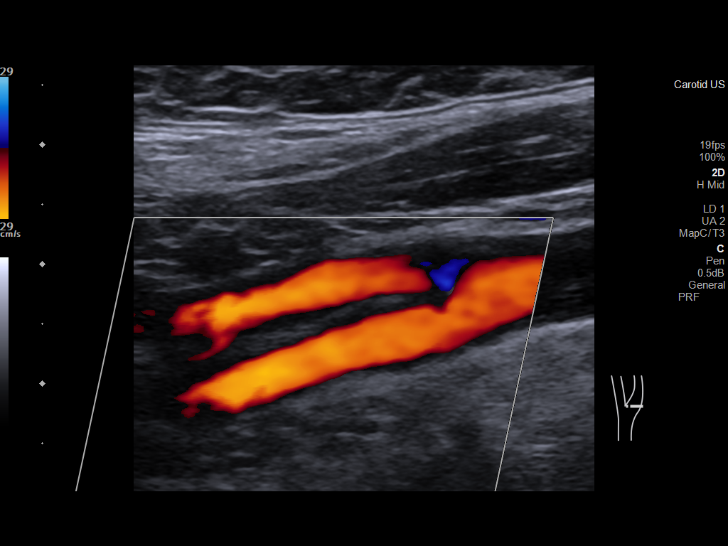
[im 63/69]
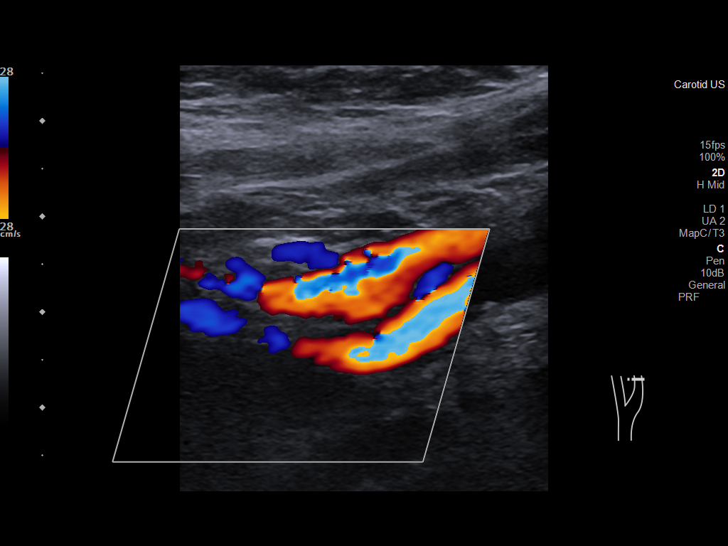
[im 69/69]
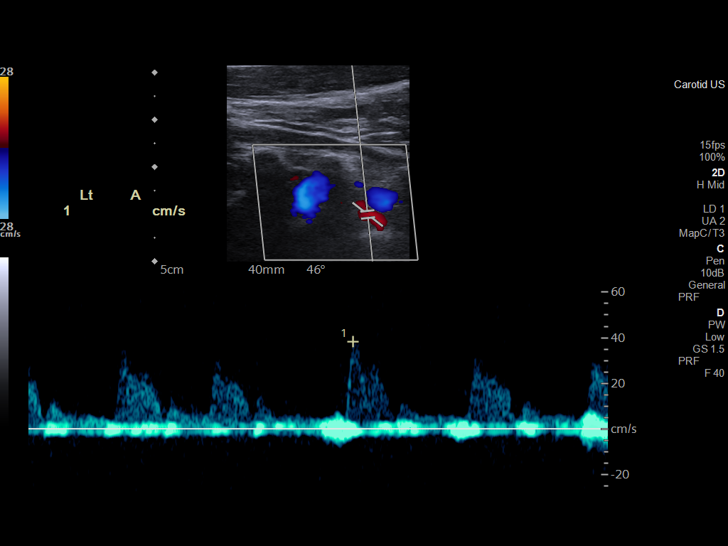

[13 of 24 positions shown; findings below may reference images not displayed]

FINDINGS: Criteria: Quantification of carotid stenosis is based on velocity
parameters that correlate the residual internal carotid diameter
with NASCET-based stenosis levels, using the diameter of the distal
internal carotid lumen as the denominator for stenosis measurement.

The following velocity measurements were obtained:

RIGHT

ICA: 109/29 cm/sec

CCA: 109/12 cm/sec

SYSTOLIC ICA/CCA RATIO:

ECA: 112 cm/sec

LEFT

ICA: 88/13 cm/sec

CCA: 121/14 cm/sec

SYSTOLIC ICA/CCA RATIO:

ECA: 119 cm/sec

RIGHT CAROTID ARTERY: Intimal thickening and minor atherosclerotic
change. Negative for stenosis, velocity elevation, turbulent flow.
Degree of narrowing less than 50% by ultrasound criteria.

RIGHT VERTEBRAL ARTERY:  Normal antegrade flow

LEFT CAROTID ARTERY: Intimal thickening and trace hypoechoic plaque
formation. Negative for stenosis, velocity elevation, turbulent
flow. Degree of narrowing also less than 50% by ultrasound criteria.

LEFT VERTEBRAL ARTERY:  Normal antegrade flow
IMPRESSION: Mild bilateral carotid atherosclerosis. Negative for stenosis.
Degree of narrowing less than 50% bilaterally by ultrasound
criteria.

Patent antegrade vertebral flow bilaterally

## 2020-11-28 IMAGING — MR MR HEAD W/O CM
11 of 12 series · 41 of 48 positions shown · non-contrast
Comparison: CT head [DATE]

CLINICAL DATA: TIA.  Dizziness and headache.

EXAM:
MRI HEAD WITHOUT CONTRAST
TECHNIQUE: Multiplanar, multiecho pulse sequences of the brain and surrounding
structures were obtained without intravenous contrast.

[Series 5: DWI · axial · 4.0mm · 0.88mm/px · z∈[-57,+81]mm · 5 of 36 slices shown (1 of 6)]
[im 1/36]
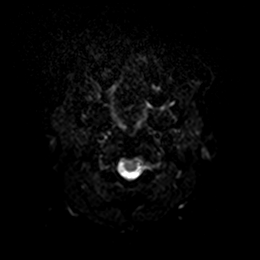
[im 9/36]
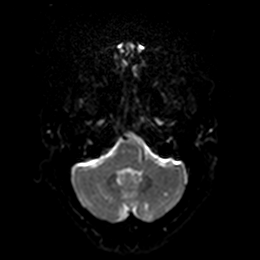
[im 18/36]
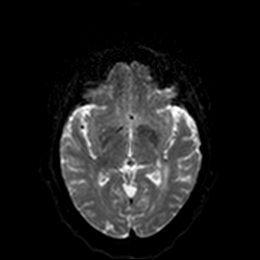
[im 27/36]
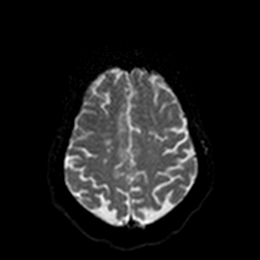
[im 36/36]
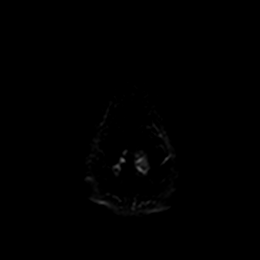

[Series 5: DWI · axial · 4.0mm · 0.88mm/px · z∈[-57,+81]mm · 5 of 36 slices shown (2 of 6)]
[im 1/36]
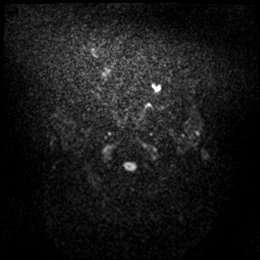
[im 9/36]
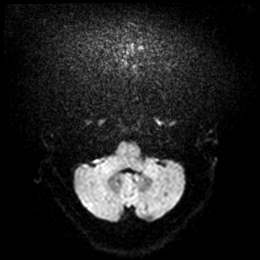
[im 18/36]
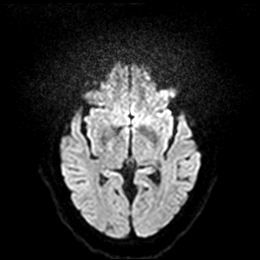
[im 27/36]
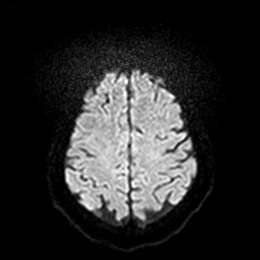
[im 36/36]
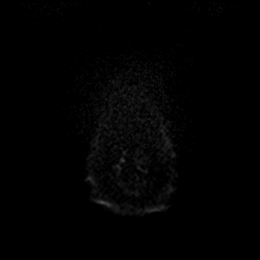

[Series 6: DWI · axial · 4.0mm · 0.88mm/px · z∈[-57,+81]mm · 4 of 36 slices shown (3 of 6)]
[im 1/36]
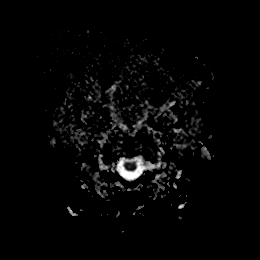
[im 12/36]
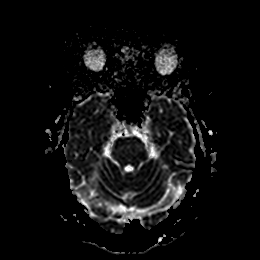
[im 24/36]
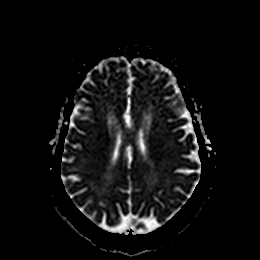
[im 36/36]
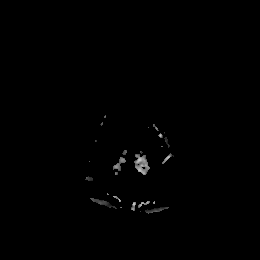

[Series 7: DWI · coronal · 5.0mm · 0.88mm/px · 4 of 29 slices shown (4 of 6)]
[im 1/29]
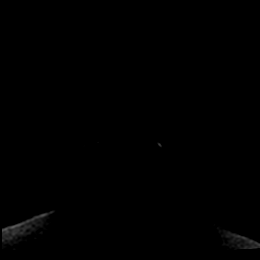
[im 10/29]
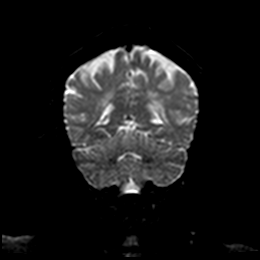
[im 19/29]
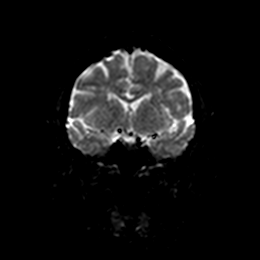
[im 29/29]
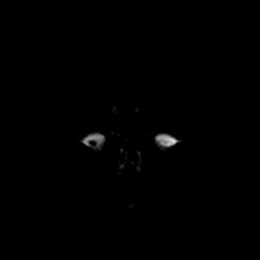

[Series 7: DWI · coronal · 5.0mm · 0.88mm/px · 4 of 29 slices shown (5 of 6)]
[im 1/29]
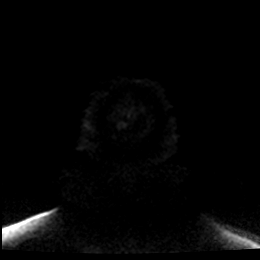
[im 10/29]
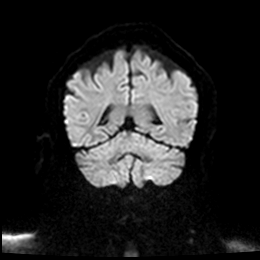
[im 19/29]
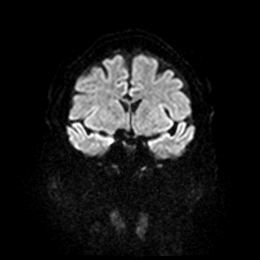
[im 29/29]
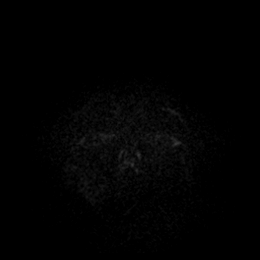

[Series 8: DWI · coronal · 5.0mm · 0.88mm/px · 4 of 29 slices shown (6 of 6)]
[im 1/29]
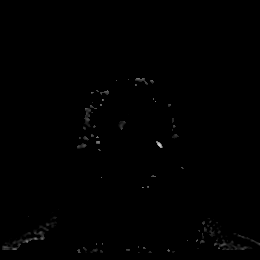
[im 10/29]
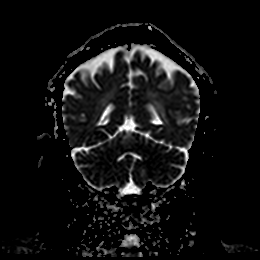
[im 19/29]
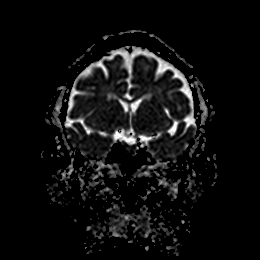
[im 29/29]
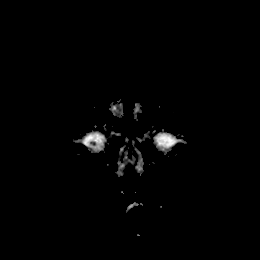

[Series 9: T1 · sagittal · 5.0mm · 0.98mm/px · 3 of 23 slices shown]
[im 1/23]
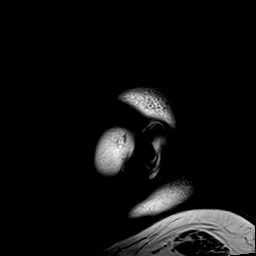
[im 12/23]
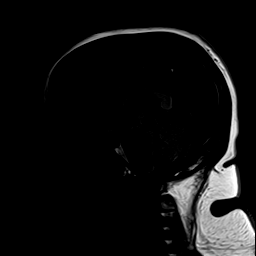
[im 23/23]
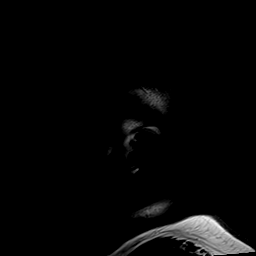

[Series 10: T2 · axial · 5.0mm · 0.72mm/px · z∈[-56,+76]mm · 2 of 20 slices shown (1 of 2)]
[im 1/20]
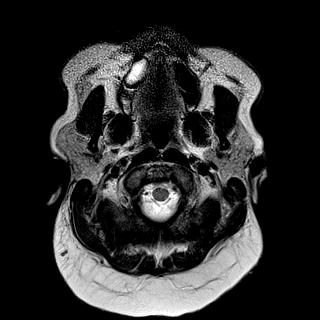
[im 20/20]
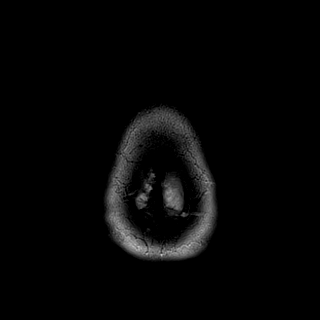

[Series 11: ax hemo · axial · 5.0mm · 0.86mm/px · z∈[-59,+84]mm · 3 of 25 slices shown]
[im 1/25]
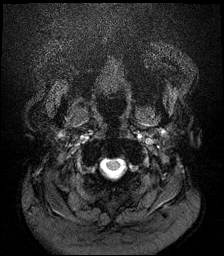
[im 13/25]
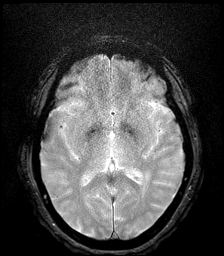
[im 25/25]
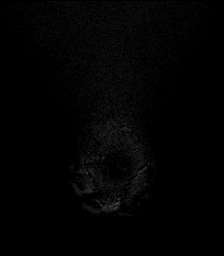

[Series 12: FLAIR · axial · 4.0mm · 0.43mm/px · z∈[-52,+83]mm · 4 of 35 slices shown]
[im 1/35]
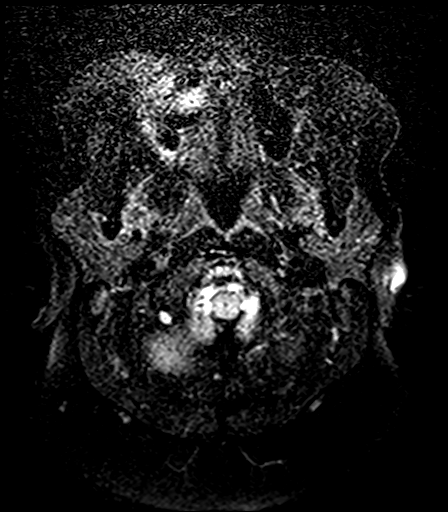
[im 12/35]
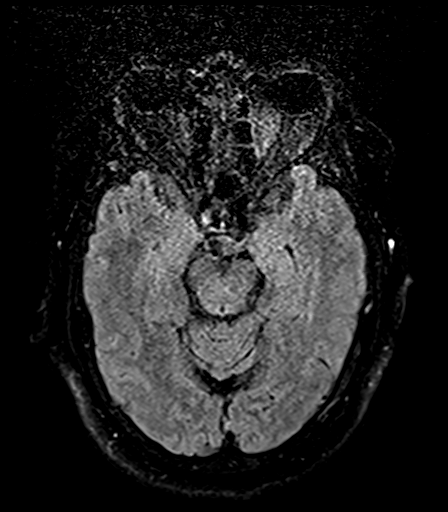
[im 23/35]
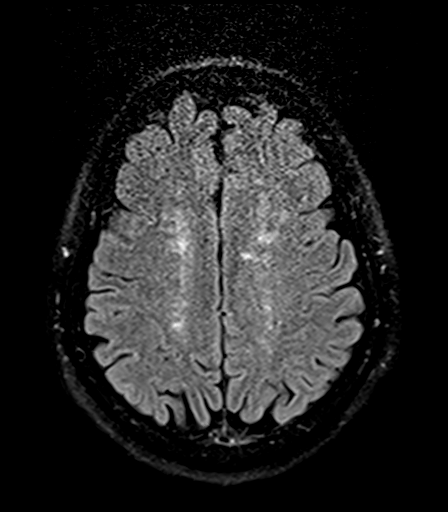
[im 35/35]
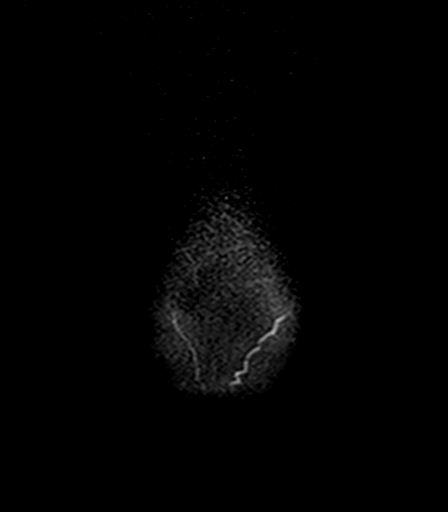

[Series 14: T2 · coronal · 5.0mm · 0.72mm/px · 3 of 28 slices shown (2 of 2)]
[im 1/28]
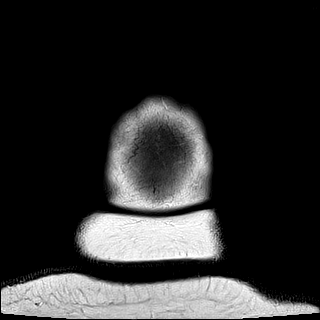
[im 14/28]
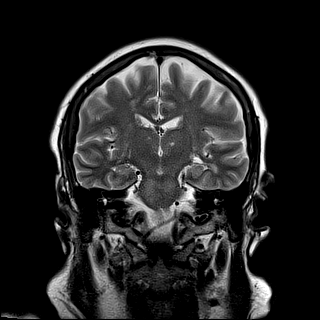
[im 28/28]
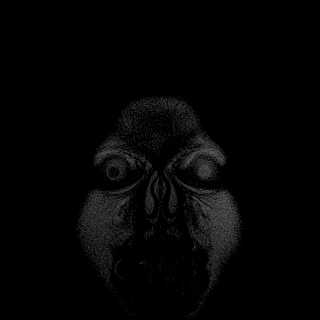

[41 of 48 positions shown; findings below may reference images not displayed]

FINDINGS: Brain: 3 mm area of restricted diffusion left occipital lobe
compatible with acute infarct. No other acute infarct

Moderate chronic ischemic change. Chronic microvascular ischemic
change in the white matter. Chronic infarcts in the basal ganglia
and cerebellum bilaterally. Chronic ischemic change in the pons
bilaterally.

Vascular: Normal arterial flow voids at the skull base.

Skull and upper cervical spine: Negative

Sinuses/Orbits: Mild mucosal edema paranasal sinuses. Left cataract
extraction

Other: None
IMPRESSION: 3 mm acute infarct left occipital lobe

Moderate chronic ischemic changes.

## 2020-11-28 MED ORDER — ALBUTEROL SULFATE HFA 108 (90 BASE) MCG/ACT IN AERS: 1.0000 | INHALATION_SPRAY | Freq: Four times a day (QID) | RESPIRATORY_TRACT | Status: DC | PRN

## 2020-11-28 MED ORDER — ACETAMINOPHEN 325 MG PO TABS: 650.0000 mg | ORAL_TABLET | Freq: Four times a day (QID) | ORAL | Status: DC | PRN

## 2020-11-28 MED ORDER — PANTOPRAZOLE SODIUM 40 MG PO TBEC
40.0000 mg | DELAYED_RELEASE_TABLET | Freq: Every day | ORAL | Status: DC
  Administered 2020-11-28 – 2020-11-29 (×2): 40 mg via ORAL
  Filled 2020-11-28 (×2): qty 1

## 2020-11-28 MED ORDER — FLUTICASONE FUROATE-VILANTEROL 100-25 MCG/ACT IN AEPB
1.0000 | INHALATION_SPRAY | Freq: Every day | RESPIRATORY_TRACT | Status: DC
  Administered 2020-11-28 – 2020-11-29 (×2): 1 via RESPIRATORY_TRACT
  Filled 2020-11-28: qty 28

## 2020-11-28 MED ORDER — ACETAMINOPHEN 650 MG RE SUPP: 650.0000 mg | Freq: Four times a day (QID) | RECTAL | Status: DC | PRN

## 2020-11-28 MED ORDER — ENOXAPARIN SODIUM 150 MG/ML IJ SOSY
130.0000 mg | PREFILLED_SYRINGE | Freq: Two times a day (BID) | INTRAMUSCULAR | Status: DC
  Administered 2020-11-28 (×2): 130 mg via SUBCUTANEOUS
  Filled 2020-11-28 (×9): qty 1

## 2020-11-28 MED ORDER — ROSUVASTATIN CALCIUM 20 MG PO TABS
40.0000 mg | ORAL_TABLET | Freq: Every day | ORAL | Status: DC
  Administered 2020-11-28 – 2020-11-29 (×2): 40 mg via ORAL
  Filled 2020-11-28 (×2): qty 2

## 2020-11-28 MED ORDER — INSULIN GLARGINE-YFGN 100 UNIT/ML ~~LOC~~ SOLN
10.0000 [IU] | Freq: Every day | SUBCUTANEOUS | Status: DC
  Administered 2020-11-28: 10 [IU] via SUBCUTANEOUS
  Filled 2020-11-28 (×4): qty 0.1

## 2020-11-28 MED ORDER — CARVEDILOL 3.125 MG PO TABS
6.2500 mg | ORAL_TABLET | Freq: Two times a day (BID) | ORAL | Status: DC
  Administered 2020-11-28 – 2020-11-29 (×3): 6.25 mg via ORAL
  Filled 2020-11-28 (×3): qty 2

## 2020-11-28 MED ORDER — CHLORHEXIDINE GLUCONATE CLOTH 2 % EX PADS
6.0000 | MEDICATED_PAD | Freq: Every day | CUTANEOUS | Status: DC
  Administered 2020-11-29: 6 via TOPICAL

## 2020-11-28 MED ORDER — ASPIRIN EC 81 MG PO TBEC
81.0000 mg | DELAYED_RELEASE_TABLET | Freq: Every day | ORAL | Status: DC
  Administered 2020-11-28 – 2020-11-29 (×2): 81 mg via ORAL
  Filled 2020-11-28 (×2): qty 1

## 2020-11-28 MED ORDER — INSULIN ASPART 100 UNIT/ML IJ SOLN
0.0000 [IU] | Freq: Every day | INTRAMUSCULAR | Status: DC
Start: 2020-11-28 — End: 2020-11-29

## 2020-11-28 MED ORDER — ALBUTEROL SULFATE (2.5 MG/3ML) 0.083% IN NEBU: 2.5000 mg | INHALATION_SOLUTION | Freq: Four times a day (QID) | RESPIRATORY_TRACT | Status: DC | PRN

## 2020-11-28 MED ORDER — POTASSIUM CHLORIDE ER 10 MEQ PO TBCR
20.0000 meq | EXTENDED_RELEASE_TABLET | Freq: Every day | ORAL | Status: DC
  Administered 2020-11-28 – 2020-11-29 (×2): 20 meq via ORAL
  Filled 2020-11-28 (×7): qty 2

## 2020-11-28 MED ORDER — PROCHLORPERAZINE EDISYLATE 10 MG/2ML IJ SOLN
10.0000 mg | Freq: Four times a day (QID) | INTRAMUSCULAR | Status: DC | PRN
  Administered 2020-11-28: 10 mg via INTRAVENOUS
  Filled 2020-11-28: qty 2

## 2020-11-28 MED ORDER — INSULIN ASPART 100 UNIT/ML IJ SOLN
0.0000 [IU] | Freq: Three times a day (TID) | INTRAMUSCULAR | Status: DC
  Administered 2020-11-28 (×3): 7 [IU] via SUBCUTANEOUS
  Administered 2020-11-29: 4 [IU] via SUBCUTANEOUS
  Administered 2020-11-29: 7 [IU] via SUBCUTANEOUS
  Filled 2020-11-28 (×3): qty 1

## 2020-11-28 NOTE — Progress Notes (Signed)
ANTICOAGULATION CONSULT NOTE - Initial Consult  Pharmacy Consult for Lovenox Indication: atrial fibrillation  Allergies  Allergen Reactions   Bystolic [Nebivolol Hcl] Palpitations   Crestor [Rosuvastatin] Other (See Comments)    Couldn't tolerate Crestor 40 mg dose    Sulfa Antibiotics Anaphylaxis   Metformin And Related Diarrhea   Other     Ok Edwards anything that has this in it makes her wheeze    Patient Measurements: Height: 5\' 2"  (157.5 cm) Weight: 127 kg (280 lb) IBW/kg (Calculated) : 50.1  Vital Signs: Temp: 98.1 F (36.7 C) (10/19 2035) Temp Source: Oral (10/19 2035) BP: 92/82 (10/20 0257) Pulse Rate: 62 (10/20 0257)  Labs: Recent Labs    11/27/20 2249  HGB 14.5  HCT 41.5  PLT 311  LABPROT 12.5  INR 0.9  CREATININE 0.62  TROPONINIHS 3    Estimated Creatinine Clearance: 90.7 mL/min (by C-G formula based on SCr of 0.62 mg/dL).   Medical History: Past Medical History:  Diagnosis Date   Arthritis    Asthma    CHF (congestive heart failure) ([place])    Coronary artery disease    Diabetes mellitus    Diabetic neuropathy (HCC)    GERD (gastroesophageal reflux disease)    History of hiatal hernia    Hx of cardiovascular stress test    Lexiscan Myoview (10/15):  Medium area of ischemia in AL and IL distribution, cannot completely exclude shifting breast attenuation, EF 64%; Abnormal study   Hyperlipidemia    Hypertension    Morbid obesity ([place])    Sleep apnea    does not wear CPAP    Assessment: 65 y/o F with hypertension found to be in new onset afib. Starting Lovenox. CBC/renal function good. PTA meds reviewed.   Goal of Therapy: Monitor platelets by anticoagulation protocol: Yes   Plan:  Lovenox 1 mg/kg subcutaneous q12h Daily CBC  Monitor for bleeding  Narda Bonds, PharmD, BCPS Clinical Pharmacist Phone: [phone]

## 2020-11-28 NOTE — Progress Notes (Signed)
Admitted early morning hours by nighttime hospitalist.  Patient seen and examined.  History reviewed.  Interviewed patient and additional test and consults were done.  65 year old with history of hypertension, coronary artery disease status post stents, type 2 diabetes on insulin, GERD and morbid obesity was having headache for the last few days and 1 episode of syncope.  She was visiting psychiatrist office and felt tingling of the left side of the face and around the left eye, mouth and nasal bridge.  Also complains of occasional palpitations.  In the emergency room blood pressure 192/91 on arrival otherwise normal.  Chest x-ray normal.  Head CT without contrast no acute intracranial abnormality.  Had episode of syncope while phlebotomist went to draw her blood.  Had urinary incontinence but no postictal confusion or seizure.  At the time of episode, noted to be in A. fib with RVR.  Started on Cardizem infusion with conversion to sinus rhythm and admitted to the hospital.  Paroxysmal A. fib: New diagnosis.  Seen by cardiology.  Already rate controlled.  Resumed carvedilol.  Heparin converted to Lovenox injection.  Currently in sinus rhythm.  2D echocardiogram pending.  Syncope and collapse/left-sided facial tingling: Currently no acute neurological deficit.  Initial CT head was nondiagnostic. Repeat MRI shows small 3 mm acute infarct left occipital lobe.  Possibly contributing to acute symptoms. -We will treat as acute ischemic stroke. -Already on anticoagulation with Lovenox with plan to change to oral anticoagulation.  Already on aspirin. Patient on Crestor 40 mg daily for coronary artery disease, will check lipid panel to evaluate response. Patient on insulin, will check A1c. PT OT and speech.  However patient is already mobilizing in the ER. Carotid duplex is without significant obstruction.  2D echocardiogram pending.  Charge: Same-day admission.  No charge visit.

## 2020-11-28 NOTE — ED Notes (Signed)
Pt called out complaining of nausea and feeling clammy. Pt bp taken manually and was 105/63 with a MAP of 72. Hospitalist made aware and to given orders.

## 2020-11-28 NOTE — Progress Notes (Signed)
*  PRELIMINARY RESULTS* Echocardiogram 2D Echocardiogram has been performed.  [name] 11/28/2020, 11:40 AM

## 2020-11-28 NOTE — Consult Note (Addendum)
Cardiology Consultation:   Patient ID: [name] MRN: [id number]; DOB: September 10, 1955  Admit date: 11/27/2020 Date of Consult: 11/28/2020  PCP:  Carol Ada, MD   [facility] Providers Cardiologist:  Larae Grooms, MD   {  Patient Profile:   [name] is a 65 y.o. female with a hx of CAD (s/p LAD stent and patent by cath in 2012, NST in 2015 showing possible ischemia but most likely breast-tissue attenuation and medical management was recommended), HTN, HLD, Type 2 DM and obesity who is being seen 11/28/2020 for the evaluation of new-onset atrial fibrillation at the request of Dr. Sloan Leiter.  History of Present Illness:   [name] was last examined by Dr. Irish Lack in 02/2020 and denied any recent chest pain or palpitations at that time. She was continued on her current medications including ASA 81mg  daily, Amlodipine 10 mg daily, Coreg 6.25 mg twice daily, Plavix 75 mg daily, Lasix 80 mg daily, Lisinopril 40 mg daily and Crestor 40 mg daily.  She presented to [facility] ED during the evening hours of 11/27/2020 for evaluation of elevated BP and a headache. She also reported tingling along the left side of her face while at her therapist appointment. Symptoms lasted for approximately 20 minutes then resolved. While she was undergoing work-up in the ED, she had an episode of unresponsiveness which was witnessed by the nurse and ED provider. This lasted for approximately 2 minutes with associated urinary incontinence but she did not have any tonic-clonic activity. She was initially in normal sinus rhythm but did have atrial fibrillation with RVR following this episode. She was started on IV Cardizem and his rates were elevated into the 150's.   Initial labs show WBC 8.9, Hgb 14.5, platelets 311, Na+ 136, K+ 2.8 and creatinine 0.62. COVID pending. Initial Hs Troponin negative at 3. CXR showed no acute findings.  CT Head with no acute intracranial abnormalities. Initial EKG  showed normal sinus rhythm, heart rate 91 with PAC's and repeat tracing showed atrial fibrillation with heart rate 108. She did convert back to NSR around 0445 and has been in NSR since with HR in the 60's to 80's.   She has been continued on IV Cardizem and full dose Lovenox. In talking with the patient this morning, she reports having intermittent presyncopal episodes in the past but never fully lost consciousness. She did not feel any chest pain or palpitations with her arrhythmia last night. Feels back to baseline this AM and denies any recurrent paraesthesias along her face. She did not experience any slurred speech, facial droop or extremity weakness yesterday when her initial episode occurred.   Past Medical History:  Diagnosis Date   Arthritis    Asthma    CHF (congestive heart failure) (HCC)    Coronary artery disease    Diabetes mellitus    Diabetic neuropathy (HCC)    GERD (gastroesophageal reflux disease)    History of hiatal hernia    Hx of cardiovascular stress test    Lexiscan Myoview (10/15):  Medium area of ischemia in AL and IL distribution, cannot completely exclude shifting breast attenuation, EF 64%; Abnormal study   Hyperlipidemia    Hypertension    Morbid obesity ([place])    Sleep apnea    does not wear CPAP    Past Surgical History:  Procedure Laterality Date   ACHILLES TENDON REPAIR  1995   right   COLONOSCOPY     DILATION AND CURETTAGE OF UTERUS  HAND SURGERY     right   heart stent  2007   PARS PLANA VITRECTOMY Left 03/24/2017   Procedure: PARS PLANA VITRECTOMY WITH 25 GAUGE;  Surgeon: Hurman Horn, MD;  Location: [facility];  Service: Ophthalmology;  Laterality: Left;   UTERINE FIBROID SURGERY  2008     Home Medications:  Prior to Admission medications   Medication Sig Start Date End Date Taking? Authorizing Provider  albuterol (PROVENTIL HFA;VENTOLIN HFA) 108 (90 Base) MCG/ACT inhaler Inhale 1-2 puffs into the lungs every 6 (six) hours as needed for  wheezing or shortness of breath. 05/24/17  Yes Long, Wonda Olds, MD  albuterol (PROVENTIL) (2.5 MG/3ML) 0.083% nebulizer solution Take 3 mLs (2.5 mg total) by nebulization every 6 (six) hours as needed for wheezing or shortness of breath. 05/28/20  Yes Faustino Congress, NP  ALPRAZolam Duanne Moron) 0.5 MG tablet Take 0.5 mg by mouth 2 (two) times daily as needed. 11/21/20  Yes [provider]  amLODipine (NORVASC) 10 MG tablet Take 1 tablet by mouth once daily 03/04/20  Yes Jettie Booze, MD  aspirin EC 81 MG tablet Take 1 tablet (81 mg total) by mouth daily. 03/03/19  Yes Varanasi, Charlann Lange, MD  BREO ELLIPTA 100-25 MCG/INH AEPB Inhale 1 puff into the lungs daily. 09/21/16  Yes [provider]  carvedilol (COREG) 6.25 MG tablet Take 1 tablet by mouth twice daily 03/04/20  Yes Jettie Booze, MD  dexlansoprazole (DEXILANT) 60 MG capsule Take 1 capsule (60 mg total) by mouth daily. 03/03/19  Yes Jettie Booze, MD  escitalopram (LEXAPRO) 5 MG tablet Take 5 mg by mouth daily. 11/22/20  Yes [provider]  furosemide (LASIX) 40 MG tablet Take 2 tablets by mouth once daily 03/04/20  Yes Jettie Booze, MD  HUMULIN R 500 UNIT/ML SOLN injection Inject 80-110 Units into the skin 2 (two) times daily with a meal. 80 units in the morning and 40 every evening. Sliding Scale. 12/25/13  Yes [provider]  lisinopril (ZESTRIL) 40 MG tablet Take 1 tablet by mouth once daily 03/04/20  Yes Jettie Booze, MD  nitroGLYCERIN (NITROSTAT) 0.4 MG SL tablet Place 1 tablet (0.4 mg total) under the tongue every 5 (five) minutes as needed for chest pain (X 3 DOSES FOR CHEST PAIN). 11/30/17  Yes Jettie Booze, MD  potassium chloride (KLOR-CON) 10 MEQ tablet Take 2 tablets (20 mEq total) by mouth daily. Patient taking differently: Take 20 mEq by mouth 2 (two) times daily. 03/04/20  Yes Jettie Booze, MD  rosuvastatin (CRESTOR) 40 MG tablet Take 1 tablet by  mouth once daily 03/04/20  Yes Jettie Booze, MD  TRULICITY 1.5 DJ/4.9FW SOPN Inject 1.5 mg into the skin every 7 (seven) days. 09/30/20  Yes [provider]  benzonatate (TESSALON) 200 MG capsule Take 400 mg by mouth every 8 (eight) hours as needed. Patient not taking: No sig reported 06/10/20   [provider]  clopidogrel (PLAVIX) 75 MG tablet Take 1 tablet by mouth once daily Patient not taking: Reported on 11/27/2020 03/04/20   Jettie Booze, MD  Dulaglutide 0.75 MG/0.5ML SOPN Inject 0.75 mg into the skin every Tuesday.    [provider]  FREESTYLE LITE test strip 1 each by Other route as directed.  07/08/11   [provider]  guaiFENesin-codeine (ROBITUSSIN AC) 100-10 MG/5ML syrup Take 5 mLs by mouth 3 (three) times daily as needed for cough. Patient not taking: Reported on 11/27/2020 05/28/20  Faustino Congress, NP  HUMULIN R U-500 KWIKPEN 500 UNIT/ML KwikPen Inject into the skin. Patient not taking: Reported on 11/27/2020 09/30/20   [provider]  HYDROcodone-acetaminophen (NORCO/VICODIN) 5-325 MG tablet Take 1 tablet by mouth every 4 (four) hours as needed. Patient not taking: Reported on 11/27/2020 08/21/20   [provider]  ondansetron (ZOFRAN-ODT) 8 MG disintegrating tablet Take 1 tablet (8 mg total) by mouth every 8 (eight) hours as needed for nausea or vomiting. 05/12/20   Scot Jun, FNP    Inpatient Medications: Scheduled Meds:  aspirin EC  81 mg Oral Daily   carvedilol  6.25 mg Oral BID   enoxaparin (LOVENOX) injection  130 mg Subcutaneous Q12H   fluticasone furoate-vilanterol  1 puff Inhalation Daily   insulin aspart  0-20 Units Subcutaneous TID WC   insulin aspart  0-5 Units Subcutaneous QHS   insulin glargine-yfgn  10 Units Subcutaneous QHS   pantoprazole  40 mg Oral Daily   potassium chloride  20 mEq Oral Daily   rosuvastatin  40 mg Oral Daily   Continuous Infusions:   PRN  Meds: acetaminophen **OR** acetaminophen, albuterol, albuterol, prochlorperazine  Allergies:    Allergies  Allergen Reactions   Bystolic [Nebivolol Hcl] Palpitations   Crestor [Rosuvastatin] Other (See Comments)    Couldn't tolerate Crestor 40 mg dose    Sulfa Antibiotics Anaphylaxis   Metformin And Related Diarrhea   Other     Ok Edwards anything that has this in it makes her wheeze    Social History:   Social History   Socioeconomic History   Marital status: Single    Spouse name: Not on file   Number of children: Not on file   Years of education: Not on file   Highest education level: Not on file  Occupational History   Not on file  Tobacco Use   Smoking status: Former    Types: Cigarettes    Quit date: 08/07/1982    Years since quitting: 38.3   Smokeless tobacco: Never  Vaping Use   Vaping Use: Never used  Substance and Sexual Activity   Alcohol use: No   Drug use: No   Sexual activity: Not on file  Other Topics Concern   Not on file  Social History Narrative   Not on file   Social Determinants of Health   Financial Resource Strain: Not on file  Food Insecurity: Not on file  Transportation Needs: Not on file  Physical Activity: Not on file  Stress: Not on file  Social Connections: Not on file  Intimate Partner Violence: Not on file    Family History:    Family History  Problem Relation Age of Onset   Kidney disease Father    Stroke Father    Heart attack Father    Stroke Maternal Grandfather    Stroke Paternal Grandfather    Asthma Other    Hypertension Other    Diabetes Other      ROS:  Please see the history of present illness.   All other ROS reviewed and negative.     Physical Exam/Data:   Vitals:   11/28/20 0720 11/28/20 0730 11/28/20 0740 11/28/20 0750  BP: (!) 111/50 (!) 119/54 133/60 (!) 109/98  Pulse: 62 65 65 68  Resp: 17 19 16 15   Temp:      TempSrc:      SpO2: 97% 97% 98% 94%  Weight:      Height:  Intake/Output  Summary (Last 24 hours) at 11/28/2020 0901 Last data filed at 11/28/2020 0313 Gross per 24 hour  Intake 150 ml  Output --  Net 150 ml   Last 3 Weights 11/27/2020 03/04/2020 06/03/2019  Weight (lbs) 280 lb 294 lb 280 lb  Weight (kg) 127.007 kg 133.358 kg 127.007 kg     Body mass index is 51.21 kg/m.  General: Pleasant obese female appearing in no acute distress. HEENT: normal Neck: no JVD Vascular: No carotid bruits; Distal pulses 2+ bilaterally Cardiac:  normal S1, S2; RRR; no murmur. Lungs:  clear to auscultation bilaterally, no wheezing, rhonchi or rales  Abd: soft, nontender, no hepatomegaly  Ext: no pitting edema Musculoskeletal:  No deformities, BUE and BLE strength normal and equal Skin: warm and dry  Neuro:  CNs 2-12 intact, no focal abnormalities noted Psych:  Normal affect   EKG:  The EKG was personally reviewed and demonstrates: Initial EKG showed normal sinus rhythm, heart rate 91 with PAC's and repeat tracing showed atrial fibrillation with heart rate 108.  Telemetry:  Telemetry was personally reviewed and demonstrates: NSR, HR in 60's to 80's this AM since 0445.  Relevant CV Studies:  NST: 11/2013 QPS Raw Data Images: Soft tissue (diaprhagm,  Breast, bowel activity) surround heart.   Stress Images:  Large areas of thinning in the anteroseptal wall (mid, distal), inferolateral wall (base, mid), anterolateal wall (base, mid) and apex.  Normal perfusion elsewhere.   Rest Images:  Improvement in the inferolateal, anterolateral distributions.  Anteroseptal defect persists.   Subtraction (SDS):  Borderline by quantitation.  Transient Ischemic Dilatation (Normal <1.22):  1.05 Lung/Heart Ratio (Normal <0.45):  0.24   Quantitative Gated Spect Images QGS EDV:  106 ml QGS ESV:  38 ml   Impression Exercise Capacity:  Lexiscan with no exercise. BP Response:  Normal blood pressure response. Clinical Symptoms:  Mild chest pain/dyspnea. ECG Impression:  No significant ST  segment change suggestive of ischemia. Comparison with Prior Nuclear Study: Scan from 2008 reported normal.     Overall Impression:  Myoview with evidence of medium area of ischemia in the anterolateral and inferolateral distributions, cannot completely exclude some shifting soft tissue attenuation (breast)  ANteroseptal defect consistent with probable soft tissue attenuation (breast).  Abnormal study.     LV Ejection Fraction: 64%.  LV Wall Motion:  NL LV Function; NL Wall Motion     Laboratory Data:  High Sensitivity Troponin:   Recent Labs  Lab 11/27/20 2249  TROPONINIHS 3     Chemistry Recent Labs  Lab 11/27/20 2249 11/28/20 0335  NA 136 136  K 2.8* 3.5  CL 99 102  CO2 26 26  GLUCOSE 168* 212*  BUN 8 11  CREATININE 0.62 0.75  CALCIUM 8.9 8.8*  MG  --  2.1  GFRNONAA >60 >60  ANIONGAP 11 8    Recent Labs  Lab 11/28/20 0335  PROT 7.7  ALBUMIN 3.7  AST 15  ALT 13  ALKPHOS 90  BILITOT 0.8   Lipids No results for input(s): CHOL, TRIG, HDL, LABVLDL, LDLCALC, CHOLHDL in the last 168 hours.  Hematology Recent Labs  Lab 11/27/20 2249 11/28/20 0335  WBC 8.9 11.6*  RBC 5.20* 5.02  HGB 14.5 14.1  HCT 41.5 40.3  MCV 79.8* 80.3  MCH 27.9 28.1  MCHC 34.9 35.0  RDW 13.7 14.0  PLT 311 321   Thyroid No results for input(s): TSH, FREET4 in the last 168 hours.  BNPNo results for input(s): BNP, PROBNP  in the last 168 hours.  DDimer No results for input(s): DDIMER in the last 168 hours.   Radiology/Studies:  CT Head Wo Contrast  Result Date: 11/27/2020 CLINICAL DATA:  Left facial numbness and syncopal episodes with altered mental status EXAM: CT HEAD WITHOUT CONTRAST TECHNIQUE: Contiguous axial images were obtained from the base of the skull through the vertex without intravenous contrast. COMPARISON:  None. FINDINGS: Brain: No evidence of acute infarction, hemorrhage, hydrocephalus, extra-axial collection or mass lesion/mass effect. Vascular: No hyperdense vessel or  unexpected calcification. Skull: Normal. Negative for fracture or focal lesion. Sinuses/Orbits: No acute finding. Other: None. IMPRESSION: No acute intracranial abnormality noted. Electronically Signed   By: Inez Catalina M.D.   On: 11/27/2020 22:42   DG Chest Port 1 View  Result Date: 11/28/2020 CLINICAL DATA:  Recent syncopal episode, initial encounter EXAM: PORTABLE CHEST 1 VIEW COMPARISON:  05/28/2020 FINDINGS: Cardiac shadow is stable. Elevation of the right hemidiaphragm is again seen. The lungs are well aerated bilaterally. No acute bony abnormality is noted. IMPRESSION: No acute abnormality seen. Electronically Signed   By: Inez Catalina M.D.   On: 11/28/2020 00:06     Assessment and Plan:   1. New-onset Atrial Fibrillation - She was in normal sinus rhythm upon presentation but had a syncopal episode while in the ED and was found to be in atrial fibrillation with RVR afterwards. She did convert back to normal sinus rhythm around 0445 today and has maintained normal sinus rhythm since. - She was hypokalemic upon arrival with K+ at 2.8 but this was appropriately replaced and at 3.5 on repeat check. Mg WNL. Will add on TSH to prior labs. Echocardiogram pending.   - Given that she is now in normal sinus rhythm and rates remain well controlled, will stop IV Cardizem and switch back to her PTA Coreg 6.25mg  BID.  - Her CHA2DS2-VASc Score is 4. Currently on full-dose Lovenox but would anticipate switching to Eliquis 5mg  BID once determined she will not require further testing this admission.   2. Syncope - She had a syncopal episode while in the ED with associated urinary incontinence but no seizure-like activity was witnessed. She feels back to baseline this morning. An echocardiogram and carotid dopplers have already been ordered by the admitting team.  - Given her facial paresthesias on admission, will defer further neurologic testing such as a Brain MRI to the admitting team. CT Head was negative  on admission. - If her work-up is unrevealing this admission, could consider a 14-day Zio patch at discharge as she reports intermittent presyncopal episodes prior to admission which could be occurring when she is having atrial fibrillation with RVR as she did not experience any associated chest pain or palpitations with this arrhythmia last night.  3. CAD - She is s/p LAD stent and patent by cath in 2012 and NST in 2015 showing possible ischemia but most likely breast-tissue attenuation and medical management was recommended. - She reports her breathing has been stable and denies any recent chest pain. Troponin was negative on admission and EKG is without acute ischemic changes. - Listed as being on ASA and Plavix at her last visit but the [facility] mentions she was not taking Plavix prior to admission. Given the need for anticoagulation, would anticipate stopping this anyway. Continue BB and statin therapy.   4. HTN - She did have some hypotension while on IV Cardizem but now resolved and BP at 137/72 on most recent check. Will stop IV Cardizem as  outlined above and restart PTA Coreg 6.25mg  BID. Was also on Amlodipine 10mg  daily and Lisinopril 40mg  daily as an outpatient so would follow BP trend prior to resuming.   5. HLD - She has been continued on PTA Crestor 40mg  daily.   Risk Assessment/Risk Scores:    CHA2DS2-VASc Score = 4   This indicates a 4.8% annual risk of stroke. The patient's score is based upon: CHF History: 0 HTN History: 1 Diabetes History: 1 Stroke History: 0 Vascular Disease History: 1 Age Score: 0 Gender Score: 1      For questions or updates, please contact [facility] Please consult [email or web address] for contact info under    Signed, Erma Heritage, PA-C  11/28/2020 9:01 AM    Attending note:  Patient seen and examined.  Case discussed with Ms. Ahmed Prima PA-C, I agree with her above findings.  We are consulted secondary to an episode of newly documented  atrial fibrillation that has resolved with intravenous Cardizem.  CHA2DS2-VASc score is 4.  Patient actually presented to the hospital reporting headache and left facial paresthesia, blood pressure elevated.  She had an episode of unresponsiveness in the ER (not on monitor) associated with urinary incontinence but no obvious seizure-like activity.  It was after this that she was found to be in atrial fibrillation when placed on the monitor.  She does have a history of CAD with previous LAD stent intervention but no prior cardiac arrhythmias.  No recent chest pain.  Patient she appears comfortable.  Afebrile, heart rate in the 70s in sinus rhythm by telemetry which I personally reviewed.  Systolic blood pressure recently ranging 130-150.  Lungs are clear.  Cardiac exam with RRR and 2/6 systolic murmur.  Pertinent lab work includes potassium 3.5, BUN 11, creatinine 0.75, AST 15, ALT 13, WBC 11.6, hemoglobin 14.1, platelets 321, TSH 3.79.  Head CT reports no acute abnormality.  Chest x-ray reports no acute process..  I personally reviewed her ECG from October 20 showing atrial fibrillation.  Patient being admitted to the hospitalist service for further assessment.  We will obtain echocardiogram and plan to switch from Lovenox to Eliquis for stroke prophylaxis.  Resume Coreg at previous outpatient dose.  We will consider getting brain MRI to exclude acute event not picked up by CT.  It may also be worth considering an outpatient cardiac monitor at discharge to assess rhythm frequency and exclude posttermination pauses.  Satira Sark, M.D., F.A.C.C.

## 2020-11-28 NOTE — ED Notes (Signed)
Patient ambulated to the bathroom unassisted.

## 2020-11-28 NOTE — ED Notes (Signed)
Pt ambulated to the bathroom unassisted.  

## 2020-11-28 NOTE — ED Notes (Signed)
Pt transported to MRI 

## 2020-11-28 NOTE — H&P (Signed)
History and Physical  [name] ZOX:[id number] DOB: 1955/07/09 DOA: 11/27/2020  Referring physician: Maudie Flakes, MD  PCP: Carol Ada, MD  Patient coming from: Home  Chief Complaint: Hypertension  HPI: [name] is a 65 y.o. female with medical history significant for hypertension, asthma CAD s/p stent placement, type 2 diabetes mellitus, GERD and morbid obesity who presents to the emergency department due to elevated blood pressure.  Patient complained of having headache for a few days and that while [place] the post office [place], she states that she must have blacked out and quickly recovered, but she did not take this to be significant.  While at psychiatrist office today (due to complex grief syndrome), she felt some tingling on the left side of face, around the left eye, mouth and nasal bridge.  She also complained of occasional palpitations.  EMS was activated and nitroglycerin was given en route to the hospital and she complained of developing headache after taking the nitroglycerin.  She denies fever, chills, nausea, vomiting, chest pain, shortness of breath.  ED Course:  In the emergency department, she was tachycardic and BP was 192/91 on arrival to the ED.  Other vital signs were within normal range.  Work-up in the ED shows MCV 79.8, hypokalemia, hyperglycemia, troponin x1 was negative. Chest x-ray showed no acute abnormality CT of head without contrast showed no acute intracranial abnormality Patient complained of nausea and lightheadedness which eventually resulted in a witnessed syncopal episode when the phlebotomist went to draw her blood.  She had urinary incontinence during the syncope and regained consciousness within 2 minutes without any postictal state.  No seizure activity was noted.  Patient, however went into atrial fibrillation with RVR after recovering, she was started on IV Cardizem drip, IV Zofran was given, potassium was replenished.   Hospitalist was asked to admit patient for further evaluation and management.  Review of Systems: Review of systems as noted in the HPI. All other systems reviewed and are negative.   Past Medical History:  Diagnosis Date   Arthritis    Asthma    CHF (congestive heart failure) (HCC)    Coronary artery disease    Diabetes mellitus    Diabetic neuropathy (HCC)    GERD (gastroesophageal reflux disease)    History of hiatal hernia    Hx of cardiovascular stress test    Lexiscan Myoview (10/15):  Medium area of ischemia in AL and IL distribution, cannot completely exclude shifting breast attenuation, EF 64%; Abnormal study   Hyperlipidemia    Hypertension    Morbid obesity ([place])    Sleep apnea    does not wear CPAP   Past Surgical History:  Procedure Laterality Date   [place]   right   COLONOSCOPY     DILATION AND CURETTAGE OF UTERUS     HAND SURGERY     right   heart stent  2007   PARS PLANA VITRECTOMY Left 03/24/2017   Procedure: PARS PLANA VITRECTOMY WITH 25 GAUGE;  Surgeon: Hurman Horn, MD;  Location: [place];  Service: Ophthalmology;  Laterality: Left;   UTERINE FIBROID SURGERY  2008    Social History:  reports that she quit smoking about 38 years ago. Her smoking use included cigarettes. She has never used smokeless tobacco. She reports that she does not drink alcohol and does not use drugs.   Allergies  Allergen Reactions   Bystolic [Nebivolol Hcl] Palpitations   Crestor [Rosuvastatin] Other (  See Comments)    Couldn't tolerate Crestor 40 mg dose    Sulfa Antibiotics Anaphylaxis   Metformin And Related Diarrhea   Other     Ok Edwards anything that has this in it makes her wheeze    Family History  Problem Relation Age of Onset   Kidney disease Father    Stroke Father    Heart attack Father    Stroke Maternal Grandfather    Stroke Paternal Grandfather    Asthma Other    Hypertension Other    Diabetes Other      Prior to Admission  medications   Medication Sig Start Date End Date Taking? Authorizing Provider  albuterol (PROVENTIL HFA;VENTOLIN HFA) 108 (90 Base) MCG/ACT inhaler Inhale 1-2 puffs into the lungs every 6 (six) hours as needed for wheezing or shortness of breath. 05/24/17  Yes Long, Wonda Olds, MD  albuterol (PROVENTIL) (2.5 MG/3ML) 0.083% nebulizer solution Take 3 mLs (2.5 mg total) by nebulization every 6 (six) hours as needed for wheezing or shortness of breath. 05/28/20  Yes Faustino Congress, NP  ALPRAZolam Duanne Moron) 0.5 MG tablet Take 0.5 mg by mouth 2 (two) times daily as needed. 11/21/20  Yes [provider]  amLODipine (NORVASC) 10 MG tablet Take 1 tablet by mouth once daily 03/04/20  Yes Jettie Booze, MD  aspirin EC 81 MG tablet Take 1 tablet (81 mg total) by mouth daily. 03/03/19  Yes Varanasi, Charlann Lange, MD  BREO ELLIPTA 100-25 MCG/INH AEPB Inhale 1 puff into the lungs daily. 09/21/16  Yes [provider]  carvedilol (COREG) 6.25 MG tablet Take 1 tablet by mouth twice daily 03/04/20  Yes Jettie Booze, MD  dexlansoprazole (DEXILANT) 60 MG capsule Take 1 capsule (60 mg total) by mouth daily. 03/03/19  Yes Jettie Booze, MD  escitalopram (LEXAPRO) 5 MG tablet Take 5 mg by mouth daily. 11/22/20  Yes [provider]  furosemide (LASIX) 40 MG tablet Take 2 tablets by mouth once daily 03/04/20  Yes Jettie Booze, MD  HUMULIN R 500 UNIT/ML SOLN injection Inject 80-110 Units into the skin 2 (two) times daily with a meal. 80 units in the morning and 40 every evening. Sliding Scale. 12/25/13  Yes [provider]  lisinopril (ZESTRIL) 40 MG tablet Take 1 tablet by mouth once daily 03/04/20  Yes Jettie Booze, MD  nitroGLYCERIN (NITROSTAT) 0.4 MG SL tablet Place 1 tablet (0.4 mg total) under the tongue every 5 (five) minutes as needed for chest pain (X 3 DOSES FOR CHEST PAIN). 11/30/17  Yes Jettie Booze, MD  potassium chloride (KLOR-CON) 10 MEQ  tablet Take 2 tablets (20 mEq total) by mouth daily. Patient taking differently: Take 20 mEq by mouth 2 (two) times daily. 03/04/20  Yes Jettie Booze, MD  rosuvastatin (CRESTOR) 40 MG tablet Take 1 tablet by mouth once daily 03/04/20  Yes Jettie Booze, MD  TRULICITY 1.5 TF/5.7DU SOPN Inject 1.5 mg into the skin every 7 (seven) days. 09/30/20  Yes [provider]  benzonatate (TESSALON) 200 MG capsule Take 400 mg by mouth every 8 (eight) hours as needed. Patient not taking: No sig reported 06/10/20   [provider]  clopidogrel (PLAVIX) 75 MG tablet Take 1 tablet by mouth once daily Patient not taking: Reported on 11/27/2020 03/04/20   Jettie Booze, MD  Dulaglutide 0.75 MG/0.5ML SOPN Inject 0.75 mg into the skin every Tuesday.    [provider]  FREESTYLE LITE test strip 1  each by Other route as directed.  07/08/11   [provider]  guaiFENesin-codeine (ROBITUSSIN AC) 100-10 MG/5ML syrup Take 5 mLs by mouth 3 (three) times daily as needed for cough. Patient not taking: Reported on 11/27/2020 05/28/20   Faustino Congress, NP  HUMULIN R U-500 KWIKPEN 500 UNIT/ML KwikPen Inject into the skin. Patient not taking: Reported on 11/27/2020 09/30/20   [provider]  HYDROcodone-acetaminophen (NORCO/VICODIN) 5-325 MG tablet Take 1 tablet by mouth every 4 (four) hours as needed. Patient not taking: Reported on 11/27/2020 08/21/20   [provider]  ondansetron (ZOFRAN-ODT) 8 MG disintegrating tablet Take 1 tablet (8 mg total) by mouth every 8 (eight) hours as needed for nausea or vomiting. 05/12/20   Scot Jun, FNP    Physical Exam: BP 127/65   Pulse 97   Temp 98.1 F (36.7 C) (Oral)   Resp 18   Ht 5\' 2"  (1.575 m)   Wt 127 kg   LMP  (LMP Unknown)   SpO2 96%   BMI 51.21 kg/m   General: 65 y.o. year-old obese female well developed well nourished in no acute distress.  Alert and oriented x3. HEENT: NCAT, EOMI Neck:  Supple, trachea medial Cardiovascular: Tachycardia.  Irregular rate and rhythm with no rubs or gallops.  No thyromegaly or JVD noted.  No lower extremity edema. 2/4 pulses in all 4 extremities. Respiratory: Clear to auscultation with no wheezes or rales. Good inspiratory effort. Abdomen: Soft, nontender nondistended with normal bowel sounds x4 quadrants. Muskuloskeletal: No cyanosis, clubbing or edema noted bilaterally Neuro: CN II-XII intact, strength 5/5 x 4, sensation, reflexes intact Skin: No ulcerative lesions noted or rashes Psychiatry: Judgement and insight appear normal. Mood is appropriate for condition and setting          Labs on Admission:  Basic Metabolic Panel: Recent Labs  Lab 11/27/20 2249  NA 136  K 2.8*  CL 99  CO2 26  GLUCOSE 168*  BUN 8  CREATININE 0.62  CALCIUM 8.9   Liver Function Tests: No results for input(s): AST, ALT, ALKPHOS, BILITOT, PROT, ALBUMIN in the last 168 hours. No results for input(s): LIPASE, AMYLASE in the last 168 hours. No results for input(s): AMMONIA in the last 168 hours. CBC: Recent Labs  Lab 11/27/20 2249  WBC 8.9  HGB 14.5  HCT 41.5  MCV 79.8*  PLT 311   Cardiac Enzymes: No results for input(s): CKTOTAL, CKMB, CKMBINDEX, TROPONINI in the last 168 hours.  BNP (last 3 results) No results for input(s): BNP in the last 8760 hours.  ProBNP (last 3 results) Recent Labs    03/11/20 0737  PROBNP 28    CBG: Recent Labs  Lab 11/27/20 2136  GLUCAP 139*    Radiological Exams on Admission: CT Head Wo Contrast  Result Date: 11/27/2020 CLINICAL DATA:  Left facial numbness and syncopal episodes with altered mental status EXAM: CT HEAD WITHOUT CONTRAST TECHNIQUE: Contiguous axial images were obtained from the base of the skull through the vertex without intravenous contrast. COMPARISON:  None. FINDINGS: Brain: No evidence of acute infarction, hemorrhage, hydrocephalus, extra-axial collection or mass lesion/mass effect.  Vascular: No hyperdense vessel or unexpected calcification. Skull: Normal. Negative for fracture or focal lesion. Sinuses/Orbits: No acute finding. Other: None. IMPRESSION: No acute intracranial abnormality noted. Electronically Signed   By: Inez Catalina M.D.   On: 11/27/2020 22:42   DG Chest Port 1 View  Result Date: 11/28/2020 CLINICAL DATA:  Recent syncopal episode, initial encounter EXAM: PORTABLE  CHEST 1 VIEW COMPARISON:  05/28/2020 FINDINGS: Cardiac shadow is stable. Elevation of the right hemidiaphragm is again seen. The lungs are well aerated bilaterally. No acute bony abnormality is noted. IMPRESSION: No acute abnormality seen. Electronically Signed   By: Inez Catalina M.D.   On: 11/28/2020 00:06    EKG: I independently viewed the EKG done and my findings are as followed: Atrial fibrillation with RVR  Assessment/Plan Present on Admission:  Paroxysmal atrial fibrillation with RVR ([place])  Obesity  Essential hypertension, benign  Mixed hyperlipidemia  Coronary atherosclerosis of native coronary artery  Principal Problem:   Paroxysmal atrial fibrillation with RVR (HCC) Active Problems:   Obesity   Coronary atherosclerosis of native coronary artery   Mixed hyperlipidemia   Essential hypertension, benign   Hypertensive urgency   Syncope and collapse   Nausea   Hypokalemia   Hyperglycemia due to diabetes mellitus (HCC)   GERD (gastroesophageal reflux disease)   Asthma  Paroxysmal atrial fibrillation with RVR CHADs-VASc score = 4 points with a stroke risk of 4.8 %/year She was started on IV Cardizem drip, we shall continue same at this time, therapeutic Lovenox was started with plan to transition to oral rate control and DOAC when the HR is better controlled. Consider cardiology consult  Syncope and collapse Continue telemetry and watch for arrhythmias Troponins x 1 - 23 ; patient denies chest pain.  EKG showed A. fib with RVR Echocardiogram will be done to rule out significant  aortic stenosis or other outflow obstruction, and also to evaluate EF and to rule out segmental/Regional wall motion abnormalities.  Carotid artery Dopplers will be done to rule out hemodynamically significant stenosis  Hypertensive urgency-resolved Essential hypertension Continue IV Cardizem drip and transition to home meds when Cardizem drip is discontinued  Nausea Continue Compazine as needed  Hypokalemia K+ is 2.8 K+ will be replenished Please monitor for AM K+ for further replenishmemnt  Hyperglycemia secondary to type 2 diabetes mellitus Continue insulin sliding scale and hypoglycemic protocol Continue insulin glargine 10 units daily and adjust dose accordingly  Asthma Continue albuterol inhaler/nebulizer as needed Continue Breo Ellipta  GERD Continue Protonix  Mixed hyperlipidemia Continue Crestor  CAD s/p stent placement Continue aspirin and Crestor  Obesity class III (BMI 51.21) Patient will be counseled on diet and lifestyle modification more stable Patient will need to follow-up with outpatient PCP for weight loss program  DVT prophylaxis: Lovenox  Code Status: Full code  Family Communication: None at bedside  Disposition Plan:  Patient is from:                        home Anticipated DC to:                   SNF or family members home Anticipated DC date:               2-3 days Anticipated DC barriers:          Patient requires inpatient management due to paroxysmal A. fib with RVR and syncope requiring further work-up  Consults called: None  Admission status: Observation    Bernadette Hoit MD [facility]  11/28/2020, 2:47 AM

## 2020-11-29 DIAGNOSIS — I480 Paroxysmal atrial fibrillation: Secondary | ICD-10-CM | POA: Diagnosis not present

## 2020-11-29 LAB — BASIC METABOLIC PANEL
Anion gap: 8 (ref 5–15)
BUN: 13 mg/dL (ref 8–23)
CO2: 29 mmol/L (ref 22–32)
Calcium: 9.1 mg/dL (ref 8.9–10.3)
Chloride: 99 mmol/L (ref 98–111)
Creatinine, Ser: 1.02 mg/dL — ABNORMAL HIGH (ref 0.44–1.00)
GFR, Estimated: >60 mL/min (ref >60)
Glucose, Bld: 221 mg/dL — ABNORMAL HIGH (ref 70–99)
Potassium: 3.7 mmol/L (ref 3.5–5.1)
Sodium: 136 mmol/L (ref 135–145)

## 2020-11-29 LAB — LIPID PANEL
Cholesterol: 169 mg/dL (ref 0–200)
HDL: 33 mg/dL — ABNORMAL LOW (ref >40)
LDL Cholesterol: 119 mg/dL — ABNORMAL HIGH (ref 0–99)
Total CHOL/HDL Ratio: 5.1 RATIO
Triglycerides: 83 mg/dL (ref <150)
VLDL: 17 mg/dL (ref 0–40)

## 2020-11-29 LAB — GLUCOSE, CAPILLARY
Glucose-Capillary: 208 mg/dL — ABNORMAL HIGH (ref 70–99)
Glucose-Capillary: 185 mg/dL — ABNORMAL HIGH (ref 70–99)

## 2020-11-29 LAB — MAGNESIUM: Magnesium: 1.8 mg/dL (ref 1.7–2.4)

## 2020-11-29 MED ORDER — APIXABAN 5 MG PO TABS: 5.0000 mg | ORAL_TABLET | Freq: Two times a day (BID) | ORAL | 0 refills | Status: DC

## 2020-11-29 MED ORDER — APIXABAN 5 MG PO TABS
5.0000 mg | ORAL_TABLET | Freq: Two times a day (BID) | ORAL | Status: DC
  Administered 2020-11-29: 5 mg via ORAL
  Filled 2020-11-29: qty 1

## 2020-11-29 MED ORDER — ALPRAZOLAM 0.5 MG PO TABS: 0.5000 mg | ORAL_TABLET | Freq: Two times a day (BID) | ORAL | Status: DC | PRN

## 2020-11-29 NOTE — Evaluation (Addendum)
Physical Therapy Evaluation Patient Details Name: [name] MRN: [id number] DOB: 1955/02/21 Today's Date: 11/29/2020  History of Present Illness  [name] is a 65 y.o. female with medical history significant for hypertension, asthma CAD s/p stent placement, type 2 diabetes mellitus, GERD and morbid obesity who presents to the emergency department due to elevated blood pressure.  Patient complained of having headache for a few days and that [place] post [place], she states that she must have blacked out and quickly recovered, but she did not take this to be significant.  While at psychiatrist office today (due to complex grief syndrome), she felt some tingling on the left side of face, around the left eye, mouth and nasal bridge.  She also complained of occasional palpitations.  EMS was activated and nitroglycerin was given en route to the hospital and she complained of developing headache after taking the nitroglycerin.  She denies fever, chills, nausea, vomiting, chest pain, shortness of breath.   Clinical Impression  Patient functioning at baseline for functional mobility and gait.  Plan:  Patient discharged from physical therapy to care of nursing for ambulation daily as tolerated for length of stay.          Recommendations for follow up therapy are one component of a multi-disciplinary discharge planning process, led by the attending physician.  Recommendations may be updated based on patient status, additional functional criteria and insurance authorization.  Follow Up Recommendations No PT follow up    Equipment Recommendations  None recommended by PT    Recommendations for Other Services       Precautions / Restrictions Precautions Precautions: None Restrictions Weight Bearing Restrictions: No      Mobility  Bed Mobility Overal bed mobility: Modified Independent                  Transfers Overall transfer level: Independent                   Ambulation/Gait Ambulation/Gait assistance: Modified independent (Device/Increase time) Gait Distance (Feet): 100 Feet Assistive device: None Gait Pattern/deviations: WFL(Within Functional Limits) Gait velocity: slightly decreased   General Gait Details: demonstrates good return for ambulation in room and hallways without loss of balance  Stairs            Wheelchair Mobility    Modified Rankin (Stroke Patients Only)       Balance Overall balance assessment: Independent                                           Pertinent Vitals/Pain Pain Assessment: No/denies pain    Home Living Family/patient expects to be discharged to:: Private residence Living Arrangements: Other (Comment) (Pt plans to stay with sister until she feels back to normal.) Available Help at Discharge: Family;Available 24 hours/day Type of Home: House Home Access: Stairs to enter Entrance Stairs-Rails: None Entrance Stairs-Number of Steps: 2 Home Layout: One level Home Equipment: Cane - single point Additional Comments: Pt plans to stay with sister until she feels back to normal. House set up is that of sister.    Prior Function Level of Independence: Independent         Comments: Worked prior to admission.     Hand Dominance   Dominant Hand: Right    Extremity/Trunk Assessment   Upper Extremity Assessment Upper Extremity Assessment: Defer to  OT evaluation    Lower Extremity Assessment Lower Extremity Assessment: Overall WFL for tasks assessed    Cervical / Trunk Assessment Cervical / Trunk Assessment: Normal  Communication   Communication: No difficulties  Cognition Arousal/Alertness: Awake/alert Behavior During Therapy: WFL for tasks assessed/performed Overall Cognitive Status: Within Functional Limits for tasks assessed                                        General Comments      Exercises     Assessment/Plan     PT Assessment Patent does not need any further PT services  PT Problem List         PT Treatment Interventions      PT Goals (Current goals can be found in the Care Plan section)  Acute Rehab PT Goals Patient Stated Goal: return home with family to assist PT Goal Formulation: With patient Time For Goal Achievement: 11/29/20 Potential to Achieve Goals: Good    Frequency     Barriers to discharge        Co-evaluation PT/OT/SLP Co-Evaluation/Treatment: Yes Reason for Co-Treatment: To address functional/ADL transfers PT goals addressed during session: Mobility/safety with mobility;Balance OT goals addressed during session: ADL's and self-care       AM-PAC PT "6 Clicks" Mobility  Outcome Measure Help needed turning from your back to your side while in a flat bed without using bedrails?: None Help needed moving from lying on your back to sitting on the side of a flat bed without using bedrails?: None Help needed moving to and from a bed to a chair (including a wheelchair)?: None Help needed standing up from a chair using your arms (e.g., wheelchair or bedside chair)?: None Help needed to walk in hospital room?: None Help needed climbing 3-5 steps with a railing? : None 6 Click Score: 24    End of Session   Activity Tolerance: Patient tolerated treatment well Patient left: in chair Nurse Communication: Mobility status PT Visit Diagnosis: Unsteadiness on feet (R26.81);Other abnormalities of gait and mobility (R26.89);Muscle weakness (generalized) (M62.81)    Time: 6720-9470 PT Time Calculation (min) (ACUTE ONLY): 13 min   Charges:   PT Evaluation $PT Eval Low Complexity: 1 Low PT Treatments $Therapeutic Activity: 8-22 mins        12:12 PM, 11/29/20 Lonell Grandchild, MPT Physical Therapist with [facility] [phone] office [phone] mobile phone

## 2020-11-29 NOTE — Progress Notes (Signed)
Progress Note  Patient Name: [name] Date of Encounter: 11/29/2020  Primary Cardiologist: Larae Grooms, MD  Subjective   Sitting in bedside chair.  No palpitations or chest pain.  Somewhat weak.  Inpatient Medications    Scheduled Meds:  aspirin EC  81 mg Oral Daily   carvedilol  6.25 mg Oral BID   Chlorhexidine Gluconate Cloth  6 each Topical Daily   enoxaparin (LOVENOX) injection  130 mg Subcutaneous Q12H   fluticasone furoate-vilanterol  1 puff Inhalation Daily   insulin aspart  0-20 Units Subcutaneous TID WC   insulin aspart  0-5 Units Subcutaneous QHS   insulin glargine-yfgn  10 Units Subcutaneous QHS   pantoprazole  40 mg Oral Daily   potassium chloride  20 mEq Oral Daily   rosuvastatin  40 mg Oral Daily    PRN Meds: acetaminophen **OR** acetaminophen, albuterol, albuterol, ALPRAZolam, prochlorperazine   Vital Signs    Vitals:   11/28/20 2347 11/28/20 2349 11/29/20 0746 11/29/20 0822  BP:  (!) 146/87    Pulse:  77    Resp:  10    Temp:  98 F (36.7 C) 98 F (36.7 C)   TempSrc:  Oral Oral   SpO2:  97%  96%  Weight:  130.5 kg    Height: 5\' 2"  (1.575 m)      No intake or output data in the 24 hours ending 11/29/20 0828 Filed Weights   11/27/20 2033 11/28/20 2349  Weight: 127 kg 130.5 kg    Telemetry    Sinus rhythm.  Personally reviewed.  ECG    An ECG dated 11/28/2020 was personally reviewed today and demonstrated:  Atrial fibrillation.  Physical Exam   GEN: No acute distress.   Neck: No JVD. Cardiac: RRR, 2/6 systolic murmur no gallop.  Respiratory: Nonlabored. Clear to auscultation bilaterally. GI: Soft, nontender, bowel sounds present. MS: No edema; No deformity. Neuro:  Nonfocal. Psych: Alert and oriented x 3. Normal affect.  Labs    Chemistry Recent Labs  Lab 11/27/20 2249 11/28/20 0335 11/29/20 0447  NA 136 136 136  K 2.8* 3.5 3.7  CL 99 102 99  CO2 26 26 29   GLUCOSE 168* 212* 221*  BUN 8 11 13   CREATININE  0.62 0.75 1.02*  CALCIUM 8.9 8.8* 9.1  PROT  --  7.7  --   ALBUMIN  --  3.7  --   AST  --  15  --   ALT  --  13  --   ALKPHOS  --  90  --   BILITOT  --  0.8  --   GFRNONAA >60 >60 >60  ANIONGAP 11 8 8      Hematology Recent Labs  Lab 11/27/20 2249 11/28/20 0335  WBC 8.9 11.6*  RBC 5.20* 5.02  HGB 14.5 14.1  HCT 41.5 40.3  MCV 79.8* 80.3  MCH 27.9 28.1  MCHC 34.9 35.0  RDW 13.7 14.0  PLT 311 321    Cardiac Enzymes Recent Labs  Lab 11/27/20 2249  TROPONINIHS 3    Radiology    CT Head Wo Contrast  Result Date: 11/27/2020 CLINICAL DATA:  Left facial numbness and syncopal episodes with altered mental status EXAM: CT HEAD WITHOUT CONTRAST TECHNIQUE: Contiguous axial images were obtained from the base of the skull through the vertex without intravenous contrast. COMPARISON:  None. FINDINGS: Brain: No evidence of acute infarction, hemorrhage, hydrocephalus, extra-axial collection or mass lesion/mass effect. Vascular: No hyperdense vessel or unexpected calcification. Skull:  Normal. Negative for fracture or focal lesion. Sinuses/Orbits: No acute finding. Other: None. IMPRESSION: No acute intracranial abnormality noted. Electronically Signed   By: Inez Catalina M.D.   On: 11/27/2020 22:42   MR BRAIN WO CONTRAST  Result Date: 11/28/2020 CLINICAL DATA:  TIA.  Dizziness and headache. EXAM: MRI HEAD WITHOUT CONTRAST TECHNIQUE: Multiplanar, multiecho pulse sequences of the brain and surrounding structures were obtained without intravenous contrast. COMPARISON:  CT head 11/27/2020 FINDINGS: Brain: 3 mm area of restricted diffusion left occipital lobe compatible with acute infarct. No other acute infarct Moderate chronic ischemic change. Chronic microvascular ischemic change in the white matter. Chronic infarcts in the basal ganglia and cerebellum bilaterally. Chronic ischemic change in the pons bilaterally. Vascular: Normal arterial flow voids at the skull base. Skull and upper cervical  spine: Negative Sinuses/Orbits: Mild mucosal edema paranasal sinuses. Left cataract extraction Other: None IMPRESSION: 3 mm acute infarct left occipital lobe Moderate chronic ischemic changes. Electronically Signed   By: Franchot Gallo M.D.   On: 11/28/2020 12:46   US Carotid Bilateral  Result Date: 11/28/2020 CLINICAL DATA:  Hypertension, syncope, hyperlipidemia EXAM: BILATERAL CAROTID DUPLEX ULTRASOUND TECHNIQUE: Pearline Cables scale imaging, color Doppler and duplex ultrasound were performed of bilateral carotid and vertebral arteries in the neck. COMPARISON:  None. FINDINGS: Criteria: Quantification of carotid stenosis is based on velocity parameters that correlate the residual internal carotid diameter with NASCET-based stenosis levels, using the diameter of the distal internal carotid lumen as the denominator for stenosis measurement. The following velocity measurements were obtained: RIGHT ICA: 109/29 cm/sec CCA: 242/35 cm/sec SYSTOLIC ICA/CCA RATIO:  1.0 ECA: 112 cm/sec LEFT ICA: 88/13 cm/sec CCA: 361/44 cm/sec SYSTOLIC ICA/CCA RATIO:  0.7 ECA: 119 cm/sec RIGHT CAROTID ARTERY: Intimal thickening and minor atherosclerotic change. Negative for stenosis, velocity elevation, turbulent flow. Degree of narrowing less than 50% by ultrasound criteria. RIGHT VERTEBRAL ARTERY:  Normal antegrade flow LEFT CAROTID ARTERY: Intimal thickening and trace hypoechoic plaque formation. Negative for stenosis, velocity elevation, turbulent flow. Degree of narrowing also less than 50% by ultrasound criteria. LEFT VERTEBRAL ARTERY:  Normal antegrade flow IMPRESSION: Mild bilateral carotid atherosclerosis. Negative for stenosis. Degree of narrowing less than 50% bilaterally by ultrasound criteria. Patent antegrade vertebral flow bilaterally Electronically Signed   By: Jerilynn Mages.  Shick M.D.   On: 11/28/2020 11:11   DG Chest Port 1 View  Result Date: 11/28/2020 CLINICAL DATA:  Recent syncopal episode, initial encounter EXAM: PORTABLE CHEST  1 VIEW COMPARISON:  05/28/2020 FINDINGS: Cardiac shadow is stable. Elevation of the right hemidiaphragm is again seen. The lungs are well aerated bilaterally. No acute bony abnormality is noted. IMPRESSION: No acute abnormality seen. Electronically Signed   By: Inez Catalina M.D.   On: 11/28/2020 00:06   ECHOCARDIOGRAM COMPLETE  Result Date: 11/28/2020    ECHOCARDIOGRAM REPORT   Patient Name:   [name] Date of Exam: 11/28/2020 Medical Rec #:  [id number]        Height:       62.0 in Accession #:    [id number]       Weight:       280.0 lb Date of Birth:  1955-05-31         BSA:          2.206 m Patient Age:    8 years         BP:           123/58 mmHg Patient Gender: F  HR:           78 bpm. Exam Location:  [facility] Procedure: 2D Echo, Cardiac Doppler and Color Doppler Indications:    Syncope  History:        Patient has no prior history of Echocardiogram examinations.                 CAD, Arrythmias:Atrial Fibrillation, Signs/Symptoms:Syncope;                 Risk Factors:Dyslipidemia, Hypertension and Diabetes. Has a                 stent which was placed about 15 years ago.  Sonographer:    Wenda Low Referring Phys: [id number] OLADAPO ADEFESO IMPRESSIONS  1. Left ventricular ejection fraction, by estimation, is 60 to 65%. The left ventricle has normal function. The left ventricle has no regional wall motion abnormalities. There is moderate concentric left ventricular hypertrophy. Left ventricular diastolic parameters are consistent with Grade I diastolic dysfunction (impaired relaxation). Elevated left ventricular end-diastolic pressure.  2. RV-RA gradient normal at 27 mmHg suggesting normal RVSP. Right ventricular systolic function is normal. The right ventricular size is normal.  3. The mitral valve is grossly normal. Trivial mitral valve regurgitation.  4. The aortic valve is tricuspid. Aortic valve regurgitation is not visualized. Aortic valve mean gradient measures 5.0 mmHg.  5.  Unable to estimate CVP. Comparison(s): No prior Echocardiogram. FINDINGS  Left Ventricle: Left ventricular ejection fraction, by estimation, is 60 to 65%. The left ventricle has normal function. The left ventricle has no regional wall motion abnormalities. The left ventricular internal cavity size was normal in size. There is  moderate concentric left ventricular hypertrophy. Left ventricular diastolic parameters are consistent with Grade I diastolic dysfunction (impaired relaxation). Elevated left ventricular end-diastolic pressure. Right Ventricle: RV-RA gradient normal at 27 mmHg suggesting normal RVSP. The right ventricular size is normal. No increase in right ventricular wall thickness. Right ventricular systolic function is normal. Left Atrium: Left atrial size was normal in size. Right Atrium: Right atrial size was normal in size. Pericardium: There is no evidence of pericardial effusion. Mitral Valve: The mitral valve is grossly normal. Trivial mitral valve regurgitation. MV peak gradient, 5.3 mmHg. The mean mitral valve gradient is 2.0 mmHg. Tricuspid Valve: The tricuspid valve is grossly normal. Tricuspid valve regurgitation is trivial. Aortic Valve: The aortic valve is tricuspid. There is mild aortic valve annular calcification. Aortic valve regurgitation is not visualized. Aortic valve mean gradient measures 5.0 mmHg. Aortic valve peak gradient measures 10.1 mmHg. Aortic valve area, by VTI measures 2.16 cm. Pulmonic Valve: The pulmonic valve was grossly normal. Pulmonic valve regurgitation is trivial. Aorta: The aortic root is normal in size and structure. Venous: Unable to estimate CVP. The inferior vena cava was not well visualized. IAS/Shunts: No atrial level shunt detected by color flow Doppler.  LEFT VENTRICLE PLAX 2D LVIDd:         4.70 cm     Diastology LVIDs:         3.00 cm     LV e' medial:    3.81 cm/s LV PW:         1.40 cm     LV E/e' medial:  21.6 LV IVS:        1.40 cm     LV e' lateral:    5.98 cm/s LVOT diam:     2.00 cm     LV E/e' lateral: 13.8 LV SV:  79 LV SV Index:   36 LVOT Area:     3.14 cm  LV Volumes (MOD) LV vol d, MOD A2C: 59.9 ml LV vol d, MOD A4C: 89.5 ml LV vol s, MOD A2C: 26.7 ml LV vol s, MOD A4C: 26.2 ml LV SV MOD A2C:     33.2 ml LV SV MOD A4C:     89.5 ml LV SV MOD BP:      49.5 ml RIGHT VENTRICLE RV Basal diam:  3.30 cm RV Mid diam:    2.70 cm RV S prime:     9.90 cm/s TAPSE (M-mode): 2.3 cm LEFT ATRIUM             Index        RIGHT ATRIUM           Index LA diam:        4.70 cm 2.13 cm/m   RA Area:     16.00 cm LA Vol (A2C):   58.2 ml 26.39 ml/m  RA Volume:   46.30 ml  20.99 ml/m LA Vol (A4C):   57.2 ml 25.93 ml/m LA Biplane Vol: 59.4 ml 26.93 ml/m  AORTIC VALVE                     PULMONIC VALVE AV Area (Vmax):    2.39 cm      PV Vmax:       0.91 m/s AV Area (Vmean):   2.31 cm      PV Peak grad:  3.3 mmHg AV Area (VTI):     2.16 cm AV Vmax:           159.00 cm/s AV Vmean:          104.000 cm/s AV VTI:            0.363 m AV Peak Grad:      10.1 mmHg AV Mean Grad:      5.0 mmHg LVOT Vmax:         121.00 cm/s LVOT Vmean:        76.600 cm/s LVOT VTI:          0.250 m LVOT/AV VTI ratio: 0.69 MITRAL VALVE                TRICUSPID VALVE MV Area (PHT): 3.23 cm     TR Peak grad:   27.5 mmHg MV Area VTI:   2.35 cm     TR Vmax:        262.00 cm/s MV Peak grad:  5.3 mmHg MV Mean grad:  2.0 mmHg     SHUNTS MV Vmax:       1.15 m/s     Systemic VTI:  0.25 m MV Vmean:      57.8 cm/s    Systemic Diam: 2.00 cm MV Decel Time: 235 msec MV E velocity: 82.30 cm/s MV A velocity: 112.00 cm/s MV E/A ratio:  0.73 Rozann Lesches MD Electronically signed by Rozann Lesches MD Signature Date/Time: 11/28/2020/11:54:30 AM    Final     Assessment & Plan    1.  Newly documented atrial fibrillation, suspect paroxysmal.  CHA2DS2-VASc score is 6.  Currently maintaining sinus rhythm.  LVEF 60 to 65% with moderate LVH.  Normal left atrial chamber size.  He has been continued on Coreg.  2.   CAD with history of LAD stent intervention, patent at angiography in 2012 and no active angina at this time.  She has been on  aspirin and statin as an outpatient.  3.  Mixed hyperlipidemia, on Crestor.  4.  Brain MRI consistent with acute stroke. 3 mm acute infarct left occipital lobe, otherwise moderate chronic ischemic changes.  Plan to transition from Lovenox to Eliquis 5 mg twice daily.  Would continue low-dose aspirin for now in light of acute stroke.  Continue Coreg and follow telemetry.  We will arrange for her to have a 14-day Zio patch following discharge to further investigate rhythm frequency and also exclude any post-termination pauses in light of her recent syncopal event.  We will also arrange outpatient follow-up.   Signed, Rozann Lesches, MD  11/29/2020, 8:28 AM

## 2020-11-29 NOTE — Discharge Summary (Signed)
Physician Discharge Summary  [name] CBS:[id number] DOB: 1955-08-28 DOA: 11/27/2020  PCP: Carol Ada, MD  Admit date: 11/27/2020 Discharge date: 11/29/2020  Admitted From: Home Disposition: Home  Recommendations for Outpatient Follow-up:  Follow up with PCP in 1-2 weeks Cardiology will schedule follow-up Will send referral to neurology for outpatient follow-up.  Home Health: N/A Equipment/Devices: N/A  Discharge Condition: Stable CODE STATUS: Full code Diet recommendation: Low-salt, low-carb diet  Discharge summary: 65 year old with history of hypertension, coronary artery disease status post stents, type 2 diabetes on insulin, GERD and morbid obesity was having headache for the last few days and 1 episode of syncope.  She was visiting psychiatrist office and felt tingling of the left side of the face and around the left eye, mouth and nasal bridge.  Also complains of occasional palpitations.  In the emergency room blood pressure 192/91 on arrival otherwise normal.  Chest x-ray normal.  Head CT without contrast no acute intracranial abnormality.  Had episode of syncope while phlebotomist went to draw her blood.  Had urinary incontinence but no postictal confusion or seizure.  At the time of episode, noted to be in A. fib with RVR.  Started on Cardizem infusion with conversion to sinus rhythm and admitted to the hospital.  MRI ultimately showed evidence of 3 mm acute left occipital stroke.  She had no remaining neurological deficit after initial syncopal event.  Acute left occipital ischemic stroke: Clinical findings, transient left facial weakness and numbness completely improved. CT head findings, normal. MRI of the brain, 3 mm acute ischemic stroke left occipital lobe, chronic microvascular changes. Carotid Doppler, no hemodynamically significant obstruction. 2D echocardiogram, normal.  No evidence of thrombus.  Ejection fraction normal. Antiplatelet therapy, on aspirin at  home.  Added Eliquis for further protection with new onset of A. fib. LDL 117.  Patient supposed to be on rosuvastatin 40 mg at home, however not taking. Hemoglobin A1c 9.  Not compliant to insulin doses. Therapy recommendations, none.  No deficits.  Back to baseline. Secondary risk factor modification including  Antiplatelet therapy, cardiology recommended to continue aspirin 81 mg daily along with Eliquis 5 mg twice a day due to new onset A. fib.  Currently in sinus rhythm.  Resumed home dose of Coreg.  Cardiology will also schedule Zio patch to monitor frequency/A. fib load along with any evidence of arrhythmias.  Hypertension, blood pressures are well controlled once she is back on her home medications.  Continue.  No indication to modify her blood pressure medications.  Type 2 diabetes on insulin, uncontrolled.  A1c 9.  Patient is on Trulicity 8.4/YKZLD week and Humulin 580 units in the morning and 40 units at evening however she is not taking short acting insulin.  Since patient is not consistently using her insulin doses, was advised to go back on prescribed doses rather than increase the doses.  She will need outpatient follow-up.  Hyperlipidemia, LDL 117.  Not consistently taking rosuvastatin.  No indication to modify therapy rather she will start taking it.  Patient was advised not to drive until seen by outpatient provider.  We will send referral to neurology for follow-up.  Due to significant symptoms, she will stay out of work for 2 weeks. PT/OT recommended no ongoing need for therapies. Extensively discussed and counseled to take her medications.         Discharge Diagnoses:  Principal Problem:   Paroxysmal atrial fibrillation with RVR (HCC) Active Problems:   Obesity   Coronary atherosclerosis of  native coronary artery   Mixed hyperlipidemia   Essential hypertension, benign   Hypertensive urgency   Syncope and collapse   Nausea   Hypokalemia   Hyperglycemia due to  diabetes mellitus (HCC)   GERD (gastroesophageal reflux disease)   Asthma    Discharge Instructions  Discharge Instructions     Ambulatory referral to Neurology   Complete by: As directed    An appointment is requested in approximately: 4 weeks   Diet - low sodium heart healthy   Complete by: As directed    Diet Carb Modified   Complete by: As directed    Increase activity slowly   Complete by: As directed       Allergies as of 11/29/2020       Reactions   Bystolic [nebivolol Hcl] Palpitations   Crestor [rosuvastatin] Other (See Comments)   Couldn't tolerate Crestor 40 mg dose    Sulfa Antibiotics Anaphylaxis   Metformin And Related Diarrhea   Other    Ok Edwards anything that has this in it makes her wheeze        Medication List     STOP taking these medications    benzonatate 200 MG capsule Commonly known as: TESSALON   clopidogrel 75 MG tablet Commonly known as: PLAVIX   guaiFENesin-codeine 100-10 MG/5ML syrup Commonly known as: ROBITUSSIN AC   HYDROcodone-acetaminophen 5-325 MG tablet Commonly known as: NORCO/VICODIN       TAKE these medications    albuterol 108 (90 Base) MCG/ACT inhaler Commonly known as: VENTOLIN HFA Inhale 1-2 puffs into the lungs every 6 (six) hours as needed for wheezing or shortness of breath.   albuterol (2.5 MG/3ML) 0.083% nebulizer solution Commonly known as: PROVENTIL Take 3 mLs (2.5 mg total) by nebulization every 6 (six) hours as needed for wheezing or shortness of breath.   ALPRAZolam 0.5 MG tablet Commonly known as: XANAX Take 0.5 mg by mouth 2 (two) times daily as needed.   amLODipine 10 MG tablet Commonly known as: NORVASC Take 1 tablet by mouth once daily   apixaban 5 MG Tabs tablet Commonly known as: ELIQUIS Take 1 tablet (5 mg total) by mouth 2 (two) times daily.   aspirin EC 81 MG tablet Take 1 tablet (81 mg total) by mouth daily.   Breo Ellipta 100-25 MCG/ACT Aepb Generic drug: fluticasone  furoate-vilanterol Inhale 1 puff into the lungs daily.   carvedilol 6.25 MG tablet Commonly known as: COREG Take 1 tablet by mouth twice daily   Dexilant 60 MG capsule Generic drug: dexlansoprazole Take 1 capsule (60 mg total) by mouth daily.   escitalopram 5 MG tablet Commonly known as: LEXAPRO Take 5 mg by mouth daily.   FREESTYLE LITE test strip Generic drug: glucose blood 1 each by Other route as directed.   furosemide 40 MG tablet Commonly known as: LASIX Take 2 tablets by mouth once daily   HUMULIN R 500 UNIT/ML injection Generic drug: insulin regular human CONCENTRATED Inject 80-110 Units into the skin 2 (two) times daily with a meal. 80 units in the morning and 40 every evening. Sliding Scale. What changed: Another medication with the same name was removed. Continue taking this medication, and follow the directions you see here.   lisinopril 40 MG tablet Commonly known as: ZESTRIL Take 1 tablet by mouth once daily   nitroGLYCERIN 0.4 MG SL tablet Commonly known as: NITROSTAT Place 1 tablet (0.4 mg total) under the tongue every 5 (five) minutes as needed for chest pain (X  3 DOSES FOR CHEST PAIN).   ondansetron 8 MG disintegrating tablet Commonly known as: ZOFRAN-ODT Take 1 tablet (8 mg total) by mouth every 8 (eight) hours as needed for nausea or vomiting.   potassium chloride 10 MEQ tablet Commonly known as: KLOR-CON Take 2 tablets (20 mEq total) by mouth daily. What changed: when to take this   rosuvastatin 40 MG tablet Commonly known as: CRESTOR Take 1 tablet by mouth once daily   Trulicity 1.5 GQ/9.1QX Sopn Generic drug: Dulaglutide Inject 1.5 mg into the skin every 7 (seven) days. What changed: Another medication with the same name was removed. Continue taking this medication, and follow the directions you see here.        Follow-up Information     Carol Ada, MD Follow up in 1 week(s).   Specialty: Family Medicine Contact  information: [address] [place] [address] [phone]         Jettie Booze, MD .   Specialties: Cardiology, Radiology, Interventional Cardiology Contact information: [address] [place] [address] [phone]                Allergies  Allergen Reactions   Bystolic [Nebivolol Hcl] Palpitations   Crestor [Rosuvastatin] Other (See Comments)    Couldn't tolerate Crestor 40 mg dose    Sulfa Antibiotics Anaphylaxis   Metformin And Related Diarrhea   Other     Ok Edwards anything that has this in it makes her wheeze    Consultations: Cardiology   Procedures/Studies: CT Head Wo Contrast  Result Date: 11/27/2020 CLINICAL DATA:  Left facial numbness and syncopal episodes with altered mental status EXAM: CT HEAD WITHOUT CONTRAST TECHNIQUE: Contiguous axial images were obtained from the base of the skull through the vertex without intravenous contrast. COMPARISON:  None. FINDINGS: Brain: No evidence of acute infarction, hemorrhage, hydrocephalus, extra-axial collection or mass lesion/mass effect. Vascular: No hyperdense vessel or unexpected calcification. Skull: Normal. Negative for fracture or focal lesion. Sinuses/Orbits: No acute finding. Other: None. IMPRESSION: No acute intracranial abnormality noted. Electronically Signed   By: Inez Catalina M.D.   On: 11/27/2020 22:42   MR BRAIN WO CONTRAST  Result Date: 11/28/2020 CLINICAL DATA:  TIA.  Dizziness and headache. EXAM: MRI HEAD WITHOUT CONTRAST TECHNIQUE: Multiplanar, multiecho pulse sequences of the brain and surrounding structures were obtained without intravenous contrast. COMPARISON:  CT head 11/27/2020 FINDINGS: Brain: 3 mm area of restricted diffusion left occipital lobe compatible with acute infarct. No other acute infarct Moderate chronic ischemic change. Chronic microvascular ischemic change in the white matter. Chronic infarcts in the basal ganglia and cerebellum  bilaterally. Chronic ischemic change in the pons bilaterally. Vascular: Normal arterial flow voids at the skull base. Skull and upper cervical spine: Negative Sinuses/Orbits: Mild mucosal edema paranasal sinuses. Left cataract extraction Other: None IMPRESSION: 3 mm acute infarct left occipital lobe Moderate chronic ischemic changes. Electronically Signed   By: Franchot Gallo M.D.   On: 11/28/2020 12:46   US Carotid Bilateral  Result Date: 11/28/2020 CLINICAL DATA:  Hypertension, syncope, hyperlipidemia EXAM: BILATERAL CAROTID DUPLEX ULTRASOUND TECHNIQUE: Pearline Cables scale imaging, color Doppler and duplex ultrasound were performed of bilateral carotid and vertebral arteries in the neck. COMPARISON:  None. FINDINGS: Criteria: Quantification of carotid stenosis is based on velocity parameters that correlate the residual internal carotid diameter with NASCET-based stenosis levels, using the diameter of the distal internal carotid lumen as the denominator for stenosis measurement. The following velocity measurements were obtained: RIGHT  ICA: 109/29 cm/sec CCA: 716/96 cm/sec SYSTOLIC ICA/CCA RATIO:  1.0 ECA: 112 cm/sec LEFT ICA: 88/13 cm/sec CCA: 789/38 cm/sec SYSTOLIC ICA/CCA RATIO:  0.7 ECA: 119 cm/sec RIGHT CAROTID ARTERY: Intimal thickening and minor atherosclerotic change. Negative for stenosis, velocity elevation, turbulent flow. Degree of narrowing less than 50% by ultrasound criteria. RIGHT VERTEBRAL ARTERY:  Normal antegrade flow LEFT CAROTID ARTERY: Intimal thickening and trace hypoechoic plaque formation. Negative for stenosis, velocity elevation, turbulent flow. Degree of narrowing also less than 50% by ultrasound criteria. LEFT VERTEBRAL ARTERY:  Normal antegrade flow IMPRESSION: Mild bilateral carotid atherosclerosis. Negative for stenosis. Degree of narrowing less than 50% bilaterally by ultrasound criteria. Patent antegrade vertebral flow bilaterally Electronically Signed   By: Jerilynn Mages.  Shick M.D.   On:  11/28/2020 11:11   DG Chest Port 1 View  Result Date: 11/28/2020 CLINICAL DATA:  Recent syncopal episode, initial encounter EXAM: PORTABLE CHEST 1 VIEW COMPARISON:  05/28/2020 FINDINGS: Cardiac shadow is stable. Elevation of the right hemidiaphragm is again seen. The lungs are well aerated bilaterally. No acute bony abnormality is noted. IMPRESSION: No acute abnormality seen. Electronically Signed   By: Inez Catalina M.D.   On: 11/28/2020 00:06   ECHOCARDIOGRAM COMPLETE  Result Date: 11/28/2020    ECHOCARDIOGRAM REPORT   Patient Name:   CHARRON COULTAS Date of Exam: 11/28/2020 Medical Rec #:  [id number]        Height:       62.0 in Accession #:    [id number]       Weight:       280.0 lb Date of Birth:  [birth year]         BSA:          2.206 m Patient Age:    35 years         BP:           123/58 mmHg Patient Gender: F                HR:           78 bpm. Exam Location:  [facility] Procedure: 2D Echo, Cardiac Doppler and Color Doppler Indications:    Syncope  History:        Patient has no prior history of Echocardiogram examinations.                 CAD, Arrythmias:Atrial Fibrillation, Signs/Symptoms:Syncope;                 Risk Factors:Dyslipidemia, Hypertension and Diabetes. Has a                 stent which was placed about 15 years ago.  Sonographer:    Wenda Low Referring Phys: [id number] OLADAPO ADEFESO IMPRESSIONS  1. Left ventricular ejection fraction, by estimation, is 60 to 65%. The left ventricle has normal function. The left ventricle has no regional wall motion abnormalities. There is moderate concentric left ventricular hypertrophy. Left ventricular diastolic parameters are consistent with Grade I diastolic dysfunction (impaired relaxation). Elevated left ventricular end-diastolic pressure.  2. RV-RA gradient normal at 27 mmHg suggesting normal RVSP. Right ventricular systolic function is normal. The right ventricular size is normal.  3. The mitral valve is grossly normal. Trivial mitral  valve regurgitation.  4. The aortic valve is tricuspid. Aortic valve regurgitation is not visualized. Aortic valve mean gradient measures 5.0 mmHg.  5. Unable to estimate CVP. Comparison(s): No prior Echocardiogram. FINDINGS  Left Ventricle: Left ventricular ejection fraction, by estimation, is  60 to 65%. The left ventricle has normal function. The left ventricle has no regional wall motion abnormalities. The left ventricular internal cavity size was normal in size. There is  moderate concentric left ventricular hypertrophy. Left ventricular diastolic parameters are consistent with Grade I diastolic dysfunction (impaired relaxation). Elevated left ventricular end-diastolic pressure. Right Ventricle: RV-RA gradient normal at 27 mmHg suggesting normal RVSP. The right ventricular size is normal. No increase in right ventricular wall thickness. Right ventricular systolic function is normal. Left Atrium: Left atrial size was normal in size. Right Atrium: Right atrial size was normal in size. Pericardium: There is no evidence of pericardial effusion. Mitral Valve: The mitral valve is grossly normal. Trivial mitral valve regurgitation. MV peak gradient, 5.3 mmHg. The mean mitral valve gradient is 2.0 mmHg. Tricuspid Valve: The tricuspid valve is grossly normal. Tricuspid valve regurgitation is trivial. Aortic Valve: The aortic valve is tricuspid. There is mild aortic valve annular calcification. Aortic valve regurgitation is not visualized. Aortic valve mean gradient measures 5.0 mmHg. Aortic valve peak gradient measures 10.1 mmHg. Aortic valve area, by VTI measures 2.16 cm. Pulmonic Valve: The pulmonic valve was grossly normal. Pulmonic valve regurgitation is trivial. Aorta: The aortic root is normal in size and structure. Venous: Unable to estimate CVP. The inferior vena cava was not well visualized. IAS/Shunts: No atrial level shunt detected by color flow Doppler.  LEFT VENTRICLE PLAX 2D LVIDd:         4.70 cm      Diastology LVIDs:         3.00 cm     LV e' medial:    3.81 cm/s LV PW:         1.40 cm     LV E/e' medial:  21.6 LV IVS:        1.40 cm     LV e' lateral:   5.98 cm/s LVOT diam:     2.00 cm     LV E/e' lateral: 13.8 LV SV:         79 LV SV Index:   36 LVOT Area:     3.14 cm  LV Volumes (MOD) LV vol d, MOD A2C: 59.9 ml LV vol d, MOD A4C: 89.5 ml LV vol s, MOD A2C: 26.7 ml LV vol s, MOD A4C: 26.2 ml LV SV MOD A2C:     33.2 ml LV SV MOD A4C:     89.5 ml LV SV MOD BP:      49.5 ml RIGHT VENTRICLE RV Basal diam:  3.30 cm RV Mid diam:    2.70 cm RV S prime:     9.90 cm/s TAPSE (M-mode): 2.3 cm LEFT ATRIUM             Index        RIGHT ATRIUM           Index LA diam:        4.70 cm 2.13 cm/m   RA Area:     16.00 cm LA Vol (A2C):   58.2 ml 26.39 ml/m  RA Volume:   46.30 ml  20.99 ml/m LA Vol (A4C):   57.2 ml 25.93 ml/m LA Biplane Vol: 59.4 ml 26.93 ml/m  AORTIC VALVE                     PULMONIC VALVE AV Area (Vmax):    2.39 cm      PV Vmax:       0.91 m/s AV Area (  Vmean):   2.31 cm      PV Peak grad:  3.3 mmHg AV Area (VTI):     2.16 cm AV Vmax:           159.00 cm/s AV Vmean:          104.000 cm/s AV VTI:            0.363 m AV Peak Grad:      10.1 mmHg AV Mean Grad:      5.0 mmHg LVOT Vmax:         121.00 cm/s LVOT Vmean:        76.600 cm/s LVOT VTI:          0.250 m LVOT/AV VTI ratio: 0.69 MITRAL VALVE                TRICUSPID VALVE MV Area (PHT): 3.23 cm     TR Peak grad:   27.5 mmHg MV Area VTI:   2.35 cm     TR Vmax:        262.00 cm/s MV Peak grad:  5.3 mmHg MV Mean grad:  2.0 mmHg     SHUNTS MV Vmax:       1.15 m/s     Systemic VTI:  0.25 m MV Vmean:      57.8 cm/s    Systemic Diam: 2.00 cm MV Decel Time: 235 msec MV E velocity: 82.30 cm/s MV A velocity: 112.00 cm/s MV E/A ratio:  0.73 Rozann Lesches MD Electronically signed by Rozann Lesches MD Signature Date/Time: 11/28/2020/11:54:30 AM    Final    (Echo, Carotid, EGD, Colonoscopy, ERCP)    Subjective: Patient seen and examined.  Today she  denies any complaints.  Telemetry shows sinus rhythm and rate controlled.  Denies any recurrent episodes of dizziness, tingling or any other symptoms.  Describes occasional blurry vision.   Discharge Exam: Vitals:   11/29/20 0746 11/29/20 0822  BP:    Pulse:    Resp:    Temp: 98 F (36.7 C)   SpO2:  96%   Vitals:   11/28/20 2347 11/28/20 2349 11/29/20 0746 11/29/20 0822  BP:  (!) 146/87    Pulse:  77    Resp:  10    Temp:  98 F (36.7 C) 98 F (36.7 C)   TempSrc:  Oral Oral   SpO2:  97%  96%  Weight:  130.5 kg    Height: 5\' 2"  (1.575 m)       General: Pt is alert, awake, not in acute distress Walking in the hallway. Cardiovascular: RRR, S1/S2 +, no rubs, no gallops Respiratory: CTA bilaterally, no wheezing, no rhonchi Abdominal: Soft, NT, ND, bowel sounds +, obese and pendulous. Extremities: no edema, no cyanosis    The results of significant diagnostics from this hospitalization (including imaging, microbiology, ancillary and laboratory) are listed below for reference.     Microbiology: Recent Results (from the past 240 hour(s))  Resp Panel by RT-PCR (Flu A&B, Covid) Nasopharyngeal Swab     Status: None   Collection Time: 11/28/20  7:59 PM   Specimen: Nasopharyngeal Swab; Nasopharyngeal(NP) swabs in vial transport medium  Result Value Ref Range Status   SARS Coronavirus 2 by RT PCR NEGATIVE NEGATIVE Final    Comment: (NOTE) SARS-CoV-2 target nucleic acids are NOT DETECTED.  The SARS-CoV-2 RNA is generally detectable in upper respiratory specimens during the acute phase of infection. The lowest concentration of SARS-CoV-2 viral copies this assay can detect is  138 copies/mL. A negative result does not preclude SARS-Cov-2 infection and should not be used as the sole basis for treatment or other patient management decisions. A negative result may occur with  improper specimen collection/handling, submission of specimen other than nasopharyngeal swab, presence of  viral mutation(s) within the areas targeted by this assay, and inadequate number of viral copies(<138 copies/mL). A negative result must be combined with clinical observations, patient history, and epidemiological information. The expected result is Negative.  Fact Sheet for Patients:  EntrepreneurPulse.com.au  Fact Sheet for Healthcare Providers:  IncredibleEmployment.be  This test is no t yet approved or cleared by the [place] FDA and  has been authorized for detection and/or diagnosis of SARS-CoV-2 by FDA under an Emergency Use Authorization (EUA). This EUA will remain  in effect (meaning this test can be used) for the duration of the COVID-19 declaration under Section 564(b)(1) of the Act, 21 U.S.C.section 360bbb-3(b)(1), unless the authorization is terminated  or revoked sooner.       Influenza A by PCR NEGATIVE NEGATIVE Final   Influenza B by PCR NEGATIVE NEGATIVE Final    Comment: (NOTE) The Xpert Xpress SARS-CoV-2/FLU/RSV plus assay is intended as an aid in the diagnosis of influenza from Nasopharyngeal swab specimens and should not be used as a sole basis for treatment. Nasal washings and aspirates are unacceptable for Xpert Xpress SARS-CoV-2/FLU/RSV testing.  Fact Sheet for Patients: EntrepreneurPulse.com.au  Fact Sheet for Healthcare Providers: IncredibleEmployment.be  This test is not yet approved or cleared by the [place] FDA and has been authorized for detection and/or diagnosis of SARS-CoV-2 by FDA under an Emergency Use Authorization (EUA). This EUA will remain in effect (meaning this test can be used) for the duration of the COVID-19 declaration under Section 564(b)(1) of the Act, 21 U.S.C. section 360bbb-3(b)(1), unless the authorization is terminated or revoked.  Performed at [facility], [address], [place], [place] [address]      Labs: BNP (last 3 results) No  results for input(s): BNP in the last 8760 hours. Basic Metabolic Panel: Recent Labs  Lab 11/27/20 2249 11/28/20 0335 11/29/20 0447  NA 136 136 136  K 2.8* 3.5 3.7  CL 99 102 99  CO2 26 26 29   GLUCOSE 168* 212* 221*  BUN 8 11 13   CREATININE 0.62 0.75 1.02*  CALCIUM 8.9 8.8* 9.1  MG  --  2.1 1.8  PHOS  --  3.2  --    Liver Function Tests: Recent Labs  Lab 11/28/20 0335  AST 15  ALT 13  ALKPHOS 90  BILITOT 0.8  PROT 7.7  ALBUMIN 3.7   No results for input(s): LIPASE, AMYLASE in the last 168 hours. No results for input(s): AMMONIA in the last 168 hours. CBC: Recent Labs  Lab 11/27/20 2249 11/28/20 0335  WBC 8.9 11.6*  HGB 14.5 14.1  HCT 41.5 40.3  MCV 79.8* 80.3  PLT 311 321   Cardiac Enzymes: No results for input(s): CKTOTAL, CKMB, CKMBINDEX, TROPONINI in the last 168 hours. BNP: Invalid input(s): POCBNP CBG: Recent Labs  Lab 11/28/20 0832 11/28/20 1241 11/28/20 1751 11/28/20 2142 11/29/20 0742  GLUCAP 228* 215* 224* 151* 208*   D-Dimer No results for input(s): DDIMER in the last 72 hours. Hgb A1c Recent Labs    11/28/20 0336  HGBA1C 9.0*   Lipid Profile Recent Labs    11/29/20 0447  CHOL 169  HDL 33*  LDLCALC 119*  TRIG 83  CHOLHDL 5.1   Thyroid function studies  Recent Labs    11/27/20 2250  TSH 3.797   Anemia work up No results for input(s): VITAMINB12, FOLATE, FERRITIN, TIBC, IRON, RETICCTPCT in the last 72 hours. Urinalysis    Component Value Date/Time   COLORURINE YELLOW 06/04/2019 2219   APPEARANCEUR CLOUDY (A) 06/04/2019 2219   LABSPEC >1.046 (H) 06/04/2019 2219   PHURINE 6.0 06/04/2019 2219   GLUCOSEU NEGATIVE 06/04/2019 2219   HGBUR NEGATIVE 06/04/2019 2219   BILIRUBINUR negative 05/12/2020 1600   KETONESUR negative 05/12/2020 1600   KETONESUR NEGATIVE 06/04/2019 2219   PROTEINUR negative 05/12/2020 1600   PROTEINUR NEGATIVE 06/04/2019 2219   UROBILINOGEN 1.0 05/12/2020 1600   UROBILINOGEN 0.2 10/07/2010 0900    NITRITE Negative 05/12/2020 1600   NITRITE NEGATIVE 06/04/2019 2219   LEUKOCYTESUR Trace (A) 05/12/2020 1600   LEUKOCYTESUR NEGATIVE 06/04/2019 2219   Sepsis Labs Invalid input(s): PROCALCITONIN,  WBC,  LACTICIDVEN Microbiology Recent Results (from the past 240 hour(s))  Resp Panel by RT-PCR (Flu A&B, Covid) Nasopharyngeal Swab     Status: None   Collection Time: 11/28/20  7:59 PM   Specimen: Nasopharyngeal Swab; Nasopharyngeal(NP) swabs in vial transport medium  Result Value Ref Range Status   SARS Coronavirus 2 by RT PCR NEGATIVE NEGATIVE Final    Comment: (NOTE) SARS-CoV-2 target nucleic acids are NOT DETECTED.  The SARS-CoV-2 RNA is generally detectable in upper respiratory specimens during the acute phase of infection. The lowest concentration of SARS-CoV-2 viral copies this assay can detect is 138 copies/mL. A negative result does not preclude SARS-Cov-2 infection and should not be used as the sole basis for treatment or other patient management decisions. A negative result may occur with  improper specimen collection/handling, submission of specimen other than nasopharyngeal swab, presence of viral mutation(s) within the areas targeted by this assay, and inadequate number of viral copies(<138 copies/mL). A negative result must be combined with clinical observations, patient history, and epidemiological information. The expected result is Negative.  Fact Sheet for Patients:  EntrepreneurPulse.com.au  Fact Sheet for Healthcare Providers:  IncredibleEmployment.be  This test is no t yet approved or cleared by the [place] FDA and  has been authorized for detection and/or diagnosis of SARS-CoV-2 by FDA under an Emergency Use Authorization (EUA). This EUA will remain  in effect (meaning this test can be used) for the duration of the COVID-19 declaration under Section 564(b)(1) of the Act, 21 U.S.C.section 360bbb-3(b)(1), unless the  authorization is terminated  or revoked sooner.       Influenza A by PCR NEGATIVE NEGATIVE Final   Influenza B by PCR NEGATIVE NEGATIVE Final    Comment: (NOTE) The Xpert Xpress SARS-CoV-2/FLU/RSV plus assay is intended as an aid in the diagnosis of influenza from Nasopharyngeal swab specimens and should not be used as a sole basis for treatment. Nasal washings and aspirates are unacceptable for Xpert Xpress SARS-CoV-2/FLU/RSV testing.  Fact Sheet for Patients: EntrepreneurPulse.com.au  Fact Sheet for Healthcare Providers: IncredibleEmployment.be  This test is not yet approved or cleared by the [place] FDA and has been authorized for detection and/or diagnosis of SARS-CoV-2 by FDA under an Emergency Use Authorization (EUA). This EUA will remain in effect (meaning this test can be used) for the duration of the COVID-19 declaration under Section 564(b)(1) of the Act, 21 U.S.C. section 360bbb-3(b)(1), unless the authorization is terminated or revoked.  Performed at [facility], [address] [place], [place] [address]      Time coordinating discharge:  40 minutes  SIGNED:  Barb Merino, MD  [facility] 11/29/2020, 10:29 AM

## 2020-11-29 NOTE — Evaluation (Signed)
Occupational Therapy Evaluation Patient Details Name: [name] MRN: [id number] DOB: 03-24-1955 Today's Date: 11/29/2020   History of Present Illness [name] is a 65 y.o. female with medical history significant for hypertension, asthma CAD s/p stent placement, type 2 diabetes mellitus, GERD and morbid obesity who presents to the emergency department due to elevated blood pressure.  Patient complained of having headache for a few days and that while [place] the post office yesterday, she states that she must have blacked out and quickly recovered, but she did not take this to be significant.  While at psychiatrist office today (due to complex grief syndrome), she felt some tingling on the left side of face, around the left eye, mouth and nasal bridge.  She also complained of occasional palpitations.  EMS was activated and nitroglycerin was given en route to the hospital and she complained of developing headache after taking the nitroglycerin.  She denies fever, chills, nausea, vomiting, chest pain, shortness of breath.   Clinical Impression   Pt agreeable to OT evaluation. Pt pleasant presenting near baseline levels for mobility. Pt independently got out of bed and ambulated within the room. Pt demonstrates good B UE strength and ROM. Mild difficulty noted with saccades with pt reporting it was difficult to focus during the assessment. Pt also reported instances of blurred vision recently. Pt would benefit from optometrist evaluation to ensure no residual deficits. Pt is not recommended for further acute OT services and will be discharged to care of nursing staff for remaining length of stay.      Recommendations for follow up therapy are one component of a multi-disciplinary discharge planning process, led by the attending physician.  Recommendations may be updated based on patient status, additional functional criteria and insurance authorization.   Follow Up Recommendations  No OT  follow up    Equipment Recommendations  None recommended by OT    Recommendations for Other Services Other (comment) (Recommend optometrist evaluation to ensure no residual vision deficits from stroke.)     Precautions / Restrictions Precautions Precautions: None Restrictions Weight Bearing Restrictions: No      Mobility Bed Mobility Overal bed mobility: Independent                  Transfers Overall transfer level: Independent                    Balance Overall balance assessment: Independent                                         ADL either performed or assessed with clinical judgement   ADL Overall ADL's : Independent                                             Vision Baseline Vision/History: 1 Wears glasses (reading) Ability to See in Adequate Light: 0 Adequate Patient Visual Report: Other (comment) (Pt reported somewhat blurred vision recently. Pt did not report blurred vision today.) Vision Assessment?: Yes Eye Alignment: Within Functional Limits Tracking/Visual Pursuits: Able to track stimulus in all quads without difficulty Saccades: Other (comment) (Difficulty maintaining gaze while other object moving within field of vision.) Convergence: Within functional limits  Pertinent Vitals/Pain Pain Assessment: No/denies pain     Hand Dominance Right   Extremity/Trunk Assessment Upper Extremity Assessment Upper Extremity Assessment: Overall WFL for tasks assessed   Lower Extremity Assessment Lower Extremity Assessment: Defer to PT evaluation   Cervical / Trunk Assessment Cervical / Trunk Assessment: Normal   Communication Communication Communication: No difficulties   Cognition Arousal/Alertness: Awake/alert Behavior During Therapy: WFL for tasks assessed/performed Overall Cognitive Status: Within Functional Limits for tasks assessed                                                       Home Living Family/patient expects to be discharged to:: Private residence Living Arrangements: Other (Comment) (Pt plans to stay with sister until she feels back to normal.) Available Help at Discharge: Family;Available 24 hours/day Type of Home: House Home Access: Stairs to enter [employer] of Steps: 2 Entrance Stairs-Rails: None Home Layout: One level     Bathroom Shower/Tub: Walk-in shower;Tub/shower unit   Bathroom Toilet: Handicapped height     Home Equipment: Cane - single point   Additional Comments: Pt plans to stay with sister until she feels back to normal. House set up is that of sister.      Prior Functioning/Environment Level of Independence: Independent        Comments: Worked prior to admission.                      OT Goals(Current goals can be found in the care plan section) Acute Rehab OT Goals Patient Stated Goal: live with sister until back to normal OT Goal Formulation: With patient  OT Frequency:     Barriers to D/C:            Co-evaluation PT/OT/SLP Co-Evaluation/Treatment: Yes Reason for Co-Treatment: To address functional/ADL transfers   OT goals addressed during session: ADL's and self-care      AM-PAC OT "6 Clicks" Daily Activity     Outcome Measure Help from another person eating meals?: None Help from another person taking care of personal grooming?: None Help from another person toileting, which includes using toliet, bedpan, or urinal?: None Help from another person bathing (including washing, rinsing, drying)?: None Help from another person to put on and taking off regular upper body clothing?: None Help from another person to put on and taking off regular lower body clothing?: None 6 Click Score: 24   End of Session    Activity Tolerance: Patient tolerated treatment well Patient left: Other (comment) (Walking with PT)  OT Visit Diagnosis: Unsteadiness on feet (R26.81);Other  symptoms and signs involving the nervous system (K80.034)                Time: 9179-1505 OT Time Calculation (min): 14 min Charges:  OT General Charges $OT Visit: 1 Visit OT Evaluation $OT Eval Low Complexity: 1 Low  Melodye Swor OT, MOT  Larey Seat 11/29/2020, 9:30 AM

## 2020-11-30 LAB — MRSA NEXT GEN BY PCR, NASAL: MRSA by PCR Next Gen: NOT DETECTED

## 2020-12-02 ENCOUNTER — Telehealth: Payer: Self-pay | Admitting: *Deleted

## 2020-12-02 ENCOUNTER — Ambulatory Visit: Payer: 59

## 2020-12-02 ENCOUNTER — Telehealth: Payer: Self-pay | Admitting: Physician Assistant

## 2020-12-02 DIAGNOSIS — I480 Paroxysmal atrial fibrillation: Secondary | ICD-10-CM

## 2020-12-02 NOTE — Telephone Encounter (Signed)
-----   Message from [name], [place] sent at 11/29/2020  8:53 AM EDT ----- Regarding: Zio Monitor This patient needs a Zio monitor for 2 weeks for paroxysmal atrial fibrillation and presyncope per Dr. Domenic Polite. Patient of Dr. Irish Lack.   Thanks,  [place]

## 2020-12-02 NOTE — Telephone Encounter (Signed)
Order placed

## 2020-12-02 NOTE — Telephone Encounter (Signed)
Checking percert on the following   14 Day Zio patch

## 2020-12-05 ENCOUNTER — Ambulatory Visit (INDEPENDENT_AMBULATORY_CARE_PROVIDER_SITE_OTHER): Payer: 59

## 2020-12-05 DIAGNOSIS — I480 Paroxysmal atrial fibrillation: Secondary | ICD-10-CM | POA: Diagnosis not present

## 2020-12-19 ENCOUNTER — Other Ambulatory Visit: Payer: Self-pay | Admitting: Interventional Cardiology

## 2020-12-20 ENCOUNTER — Emergency Department (HOSPITAL_COMMUNITY)
Admission: EM | Admit: 2020-12-20 | Discharge: 2020-12-20 | Disposition: A | Discharge status: Home / Self Care | Payer: 59 | Attending: Emergency Medicine | Admitting: Emergency Medicine

## 2020-12-20 ENCOUNTER — Encounter (HOSPITAL_COMMUNITY): Payer: Self-pay | Admitting: *Deleted

## 2020-12-20 ENCOUNTER — Emergency Department (HOSPITAL_COMMUNITY): Payer: 59

## 2020-12-20 ENCOUNTER — Other Ambulatory Visit: Payer: Self-pay

## 2020-12-20 DIAGNOSIS — Z7982 Long term (current) use of aspirin: Secondary | ICD-10-CM | POA: Insufficient documentation

## 2020-12-20 DIAGNOSIS — Z7901 Long term (current) use of anticoagulants: Secondary | ICD-10-CM | POA: Insufficient documentation

## 2020-12-20 DIAGNOSIS — Z87891 Personal history of nicotine dependence: Secondary | ICD-10-CM | POA: Diagnosis not present

## 2020-12-20 DIAGNOSIS — I2510 Atherosclerotic heart disease of native coronary artery without angina pectoris: Secondary | ICD-10-CM | POA: Diagnosis not present

## 2020-12-20 DIAGNOSIS — R29810 Facial weakness: Secondary | ICD-10-CM | POA: Diagnosis present

## 2020-12-20 DIAGNOSIS — E1140 Type 2 diabetes mellitus with diabetic neuropathy, unspecified: Secondary | ICD-10-CM | POA: Diagnosis not present

## 2020-12-20 DIAGNOSIS — Z79899 Other long term (current) drug therapy: Secondary | ICD-10-CM | POA: Diagnosis not present

## 2020-12-20 DIAGNOSIS — I509 Heart failure, unspecified: Secondary | ICD-10-CM | POA: Insufficient documentation

## 2020-12-20 DIAGNOSIS — J45909 Unspecified asthma, uncomplicated: Secondary | ICD-10-CM | POA: Diagnosis not present

## 2020-12-20 DIAGNOSIS — N3000 Acute cystitis without hematuria: Secondary | ICD-10-CM | POA: Insufficient documentation

## 2020-12-20 DIAGNOSIS — I110 Hypertensive heart disease with heart failure: Secondary | ICD-10-CM | POA: Diagnosis not present

## 2020-12-20 DIAGNOSIS — Z7984 Long term (current) use of oral hypoglycemic drugs: Secondary | ICD-10-CM | POA: Diagnosis not present

## 2020-12-20 LAB — CBC WITH DIFFERENTIAL/PLATELET
Abs Immature Granulocytes: 0.01 10*3/uL (ref 0.00–0.07)
Basophils Absolute: 0 10*3/uL (ref 0.0–0.1)
Basophils Relative: 1 %
Eosinophils Absolute: 0.1 10*3/uL (ref 0.0–0.5)
Eosinophils Relative: 2 %
HCT: 40.7 % (ref 36.0–46.0)
Hemoglobin: 14.1 g/dL (ref 12.0–15.0)
Immature Granulocytes: 0 %
Lymphocytes Relative: 41 %
Lymphs Abs: 2.9 10*3/uL (ref 0.7–4.0)
MCH: 27.8 pg (ref 26.0–34.0)
MCHC: 34.6 g/dL (ref 30.0–36.0)
MCV: 80.1 fL (ref 80.0–100.0)
Monocytes Absolute: 0.6 10*3/uL (ref 0.1–1.0)
Monocytes Relative: 9 %
Neutro Abs: 3.4 10*3/uL (ref 1.7–7.7)
Neutrophils Relative %: 47 %
Platelets: 317 10*3/uL (ref 150–400)
RBC: 5.08 MIL/uL (ref 3.87–5.11)
RDW: 13.5 % (ref 11.5–15.5)
WBC: 7.1 10*3/uL (ref 4.0–10.5)
nRBC: 0 % (ref 0.0–0.2)

## 2020-12-20 LAB — COMPREHENSIVE METABOLIC PANEL
ALT: 16 U/L (ref 0–44)
AST: 17 U/L (ref 15–41)
Albumin: 3.9 g/dL (ref 3.5–5.0)
Alkaline Phosphatase: 88 U/L (ref 38–126)
Anion gap: 8 (ref 5–15)
BUN: 9 mg/dL (ref 8–23)
CO2: 28 mmol/L (ref 22–32)
Calcium: 9.6 mg/dL (ref 8.9–10.3)
Chloride: 102 mmol/L (ref 98–111)
Creatinine, Ser: 0.73 mg/dL (ref 0.44–1.00)
GFR, Estimated: >60 mL/min (ref >60)
Glucose, Bld: 72 mg/dL (ref 70–99)
Potassium: 3.8 mmol/L (ref 3.5–5.1)
Sodium: 138 mmol/L (ref 135–145)
Total Bilirubin: 0.6 mg/dL (ref 0.3–1.2)
Total Protein: 7.9 g/dL (ref 6.5–8.1)

## 2020-12-20 LAB — PROTIME-INR
INR: 1.1 (ref 0.8–1.2)
Prothrombin Time: 14.3 seconds (ref 11.4–15.2)

## 2020-12-20 LAB — URINALYSIS, ROUTINE W REFLEX MICROSCOPIC
Bilirubin Urine: NEGATIVE
Glucose, UA: NEGATIVE mg/dL
Hgb urine dipstick: NEGATIVE
Ketones, ur: NEGATIVE mg/dL
Nitrite: NEGATIVE
Protein, ur: 30 mg/dL — AB
Specific Gravity, Urine: 1.018 (ref 1.005–1.030)
pH: 7 (ref 5.0–8.0)

## 2020-12-20 IMAGING — MR MR HEAD W/O CM
9 of 10 series · 39 of 48 positions shown · non-contrast
Comparison: [DATE]

CLINICAL DATA: Transient ischemic attack

EXAM:
MRI HEAD WITHOUT CONTRAST
TECHNIQUE: Multiplanar, multiecho pulse sequences of the brain and surrounding
structures were obtained without intravenous contrast.

[Series 5: DWI · axial · 4.0mm · 0.88mm/px · z∈[-35,+103]mm · 5 of 36 slices shown (1 of 4)]
[im 1/36]
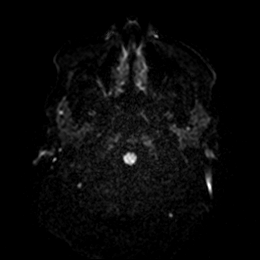
[im 9/36]
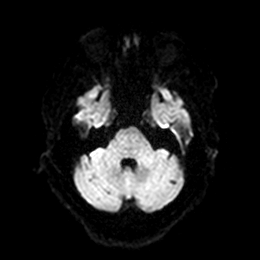
[im 18/36]
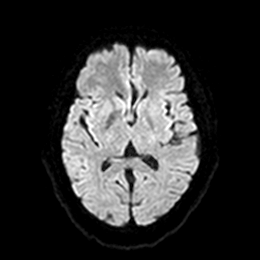
[im 27/36]
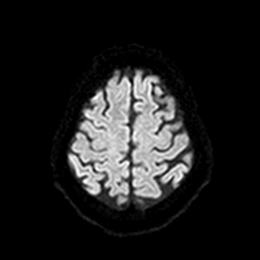
[im 36/36]
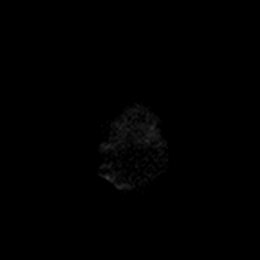

[Series 6: DWI · axial · 4.0mm · 0.88mm/px · z∈[-35,+103]mm · 5 of 36 slices shown (2 of 4)]
[im 1/36]
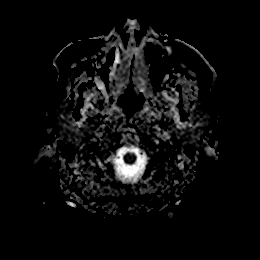
[im 9/36]
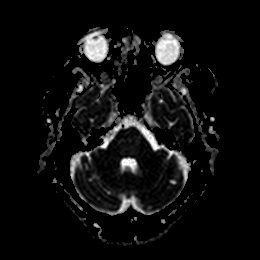
[im 18/36]
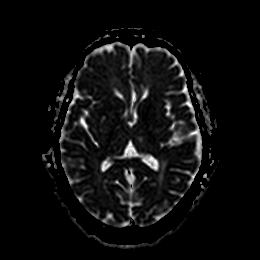
[im 27/36]
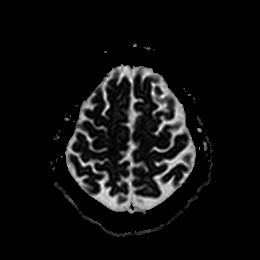
[im 36/36]
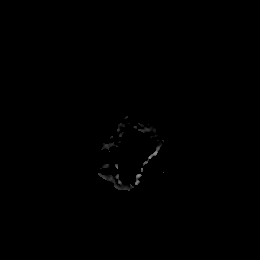

[Series 7: DWI · coronal · 4.0mm · 0.88mm/px · 5 of 32 slices shown (3 of 4)]
[im 1/32]
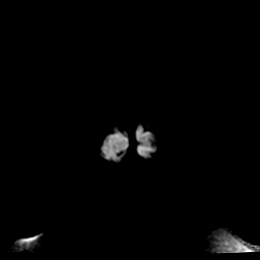
[im 8/32]
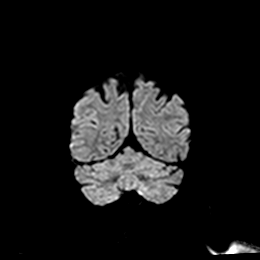
[im 16/32]
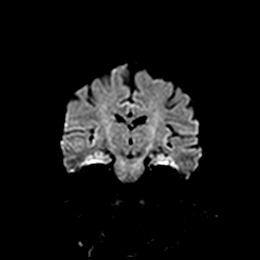
[im 24/32]
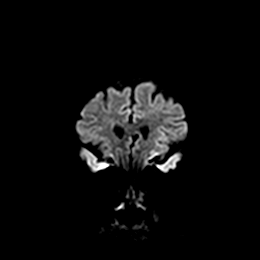
[im 32/32]
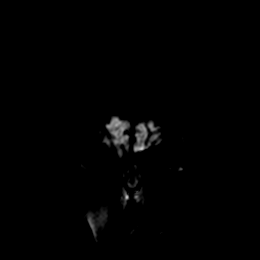

[Series 8: DWI · coronal · 4.0mm · 0.88mm/px · 5 of 32 slices shown (4 of 4)]
[im 1/32]
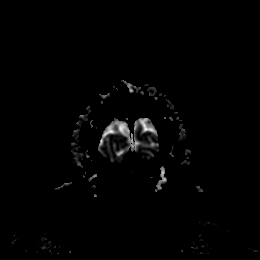
[im 8/32]
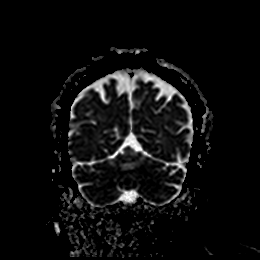
[im 16/32]
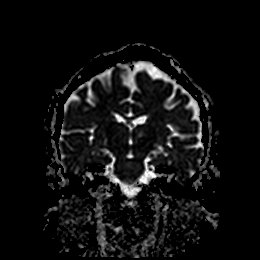
[im 24/32]
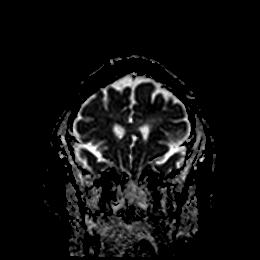
[im 32/32]
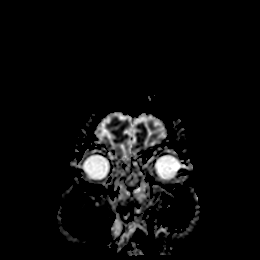

[Series 9: T1 · sagittal · 5.0mm · 0.80mm/px · 3 of 23 slices shown]
[im 1/23]
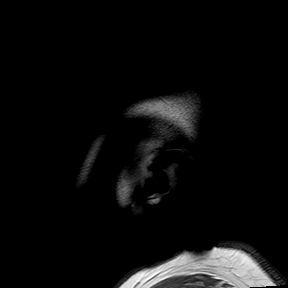
[im 12/23]
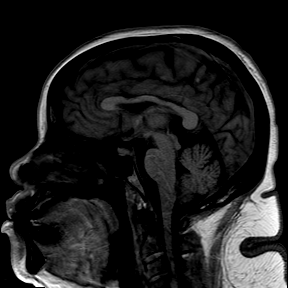
[im 23/23]
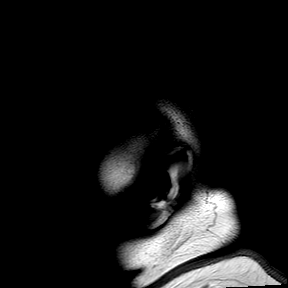

[Series 10: T2 · axial · 5.0mm · 0.72mm/px · z∈[-42,+110]mm · 3 of 23 slices shown (1 of 2)]
[im 1/23]
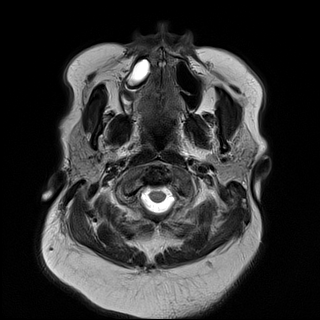
[im 12/23]
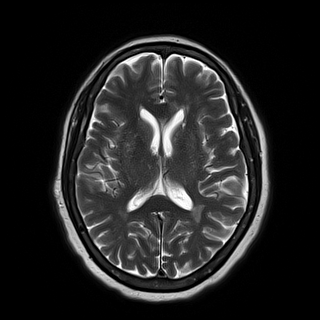
[im 23/23]
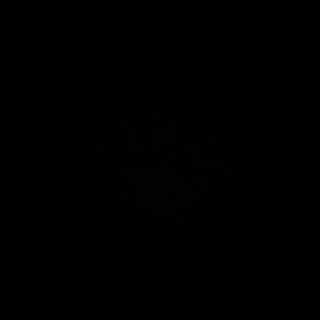

[Series 11: ax hemo · axial · 5.0mm · 0.86mm/px · z∈[-38,+104]mm · 4 of 25 slices shown]
[im 1/25]
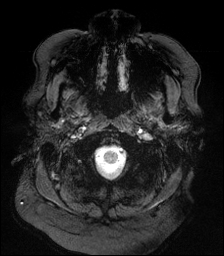
[im 9/25]
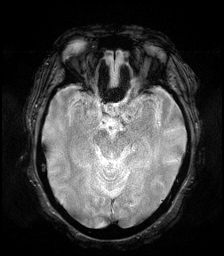
[im 17/25]
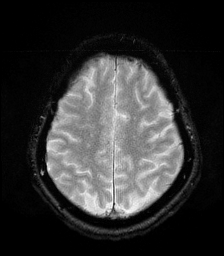
[im 25/25]
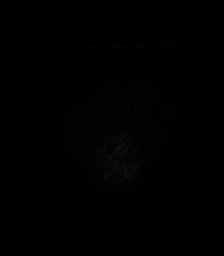

[Series 12: FLAIR · axial · 4.0mm · 0.43mm/px · z∈[-40,+106]mm · 5 of 38 slices shown]
[im 1/38]
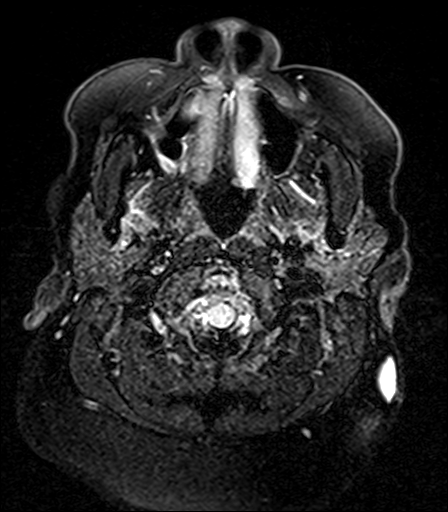
[im 10/38]
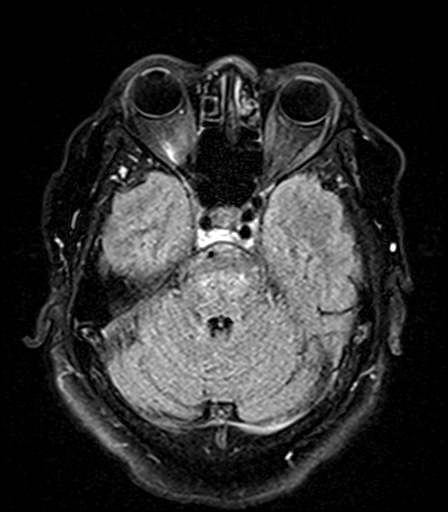
[im 19/38]
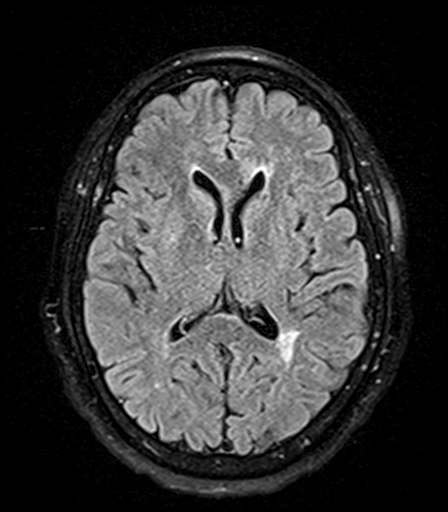
[im 28/38]
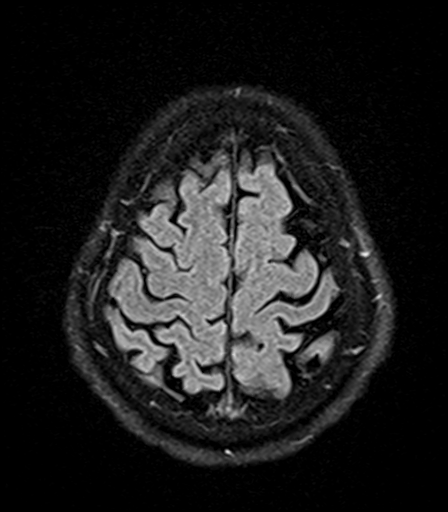
[im 38/38]
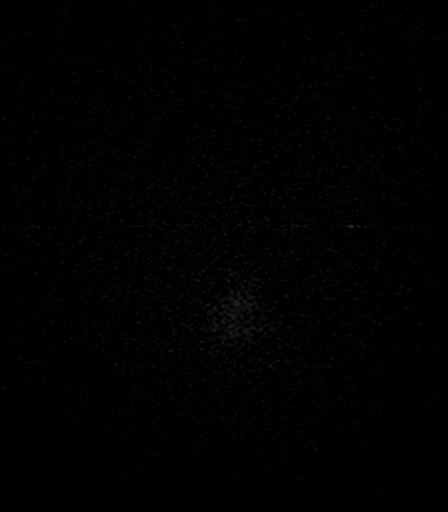

[Series 14: T2 · coronal · 5.0mm · 0.72mm/px · 4 of 28 slices shown (2 of 2)]
[im 1/28]
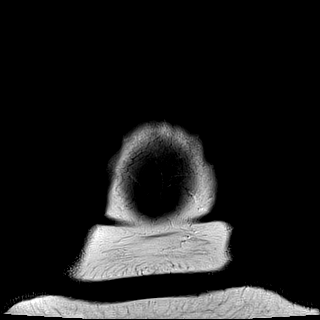
[im 10/28]
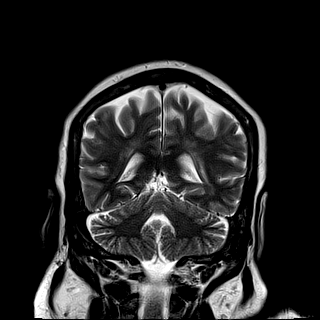
[im 19/28]
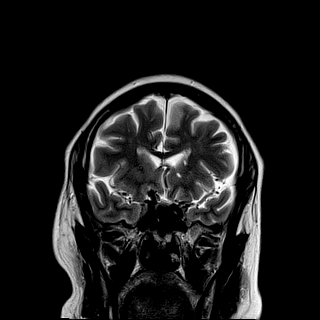
[im 28/28]
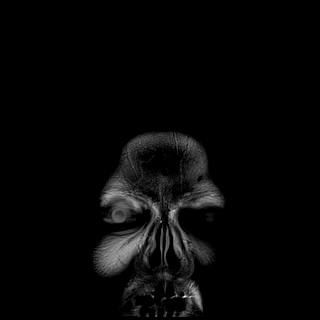

[39 of 48 positions shown; findings below may reference images not displayed]

FINDINGS: Brain: No restricted diffusion to suggest acute infarct. No acute
hemorrhage, mass, mass effect, or midline shift. T2 hyperintense
signal in the periventricular white matter and pons, likely the
sequela of chronic small vessel ischemic disease. Chronic infarcts
in the bilateral basal ganglia, thalami, and cerebellar hemispheres.
No hydrocephalus or extra-axial collection.

Vascular: Normal flow voids.

Skull and upper cervical spine: Normal marrow signal.

Sinuses/Orbits: Mucosal thickening in the right maxillary sinus.
Status post left lens replacement.

Other: The mastoids are well aerated.
IMPRESSION: No acute intracranial process. No evidence of acute infarction or
hemorrhage.

## 2020-12-20 IMAGING — CT CT HEAD W/O CM
3 series · 16 of 47 positions shown, 19 images · non-contrast
Comparison: Same day MRI

CLINICAL DATA: Transient ischemic attack (TIA)

EXAM:
CT HEAD WITHOUT CONTRAST
TECHNIQUE: Contiguous axial images were obtained from the base of the skull
through the vertex without intravenous contrast.

[Series 2: head w o · axial · 0.48mm/px · z∈[+8,+133]mm · 10 of 31 slices shown, 13 images]
[im 3/31  brain]
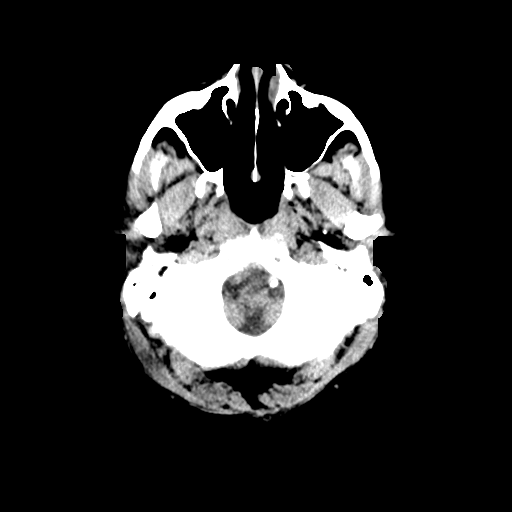
[im 3/31  bone]
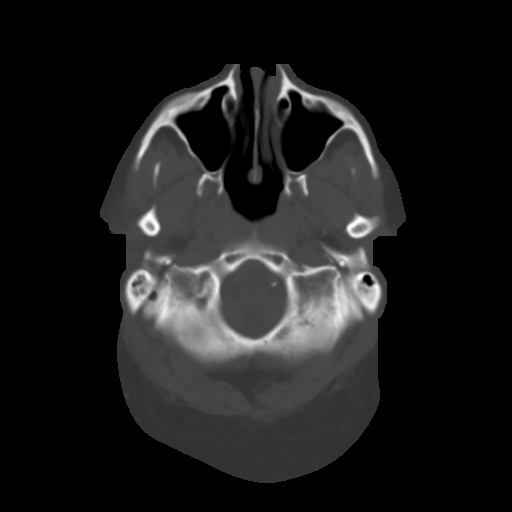
[im 6/31  brain]
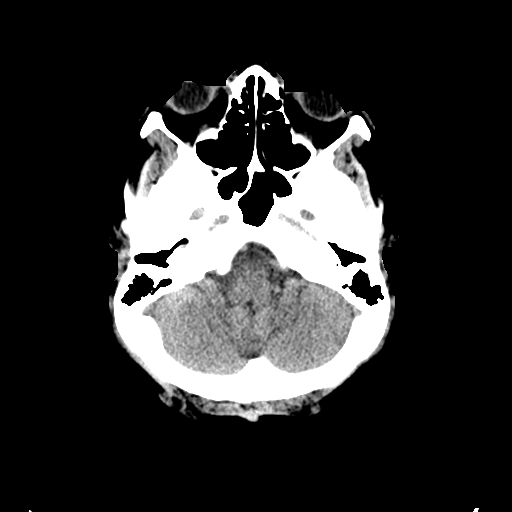
[im 9/31  brain]
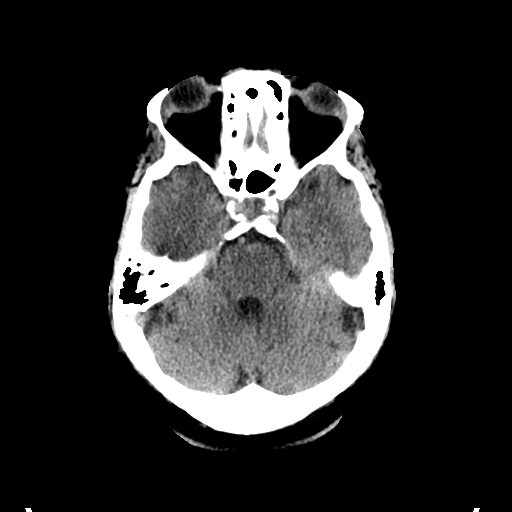
[im 11/31  brain]
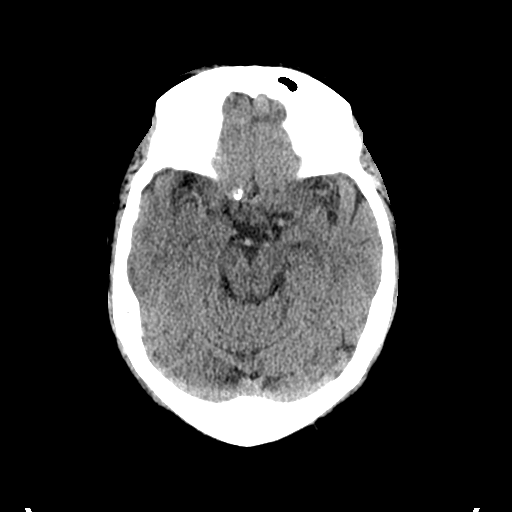
[im 14/31  brain]
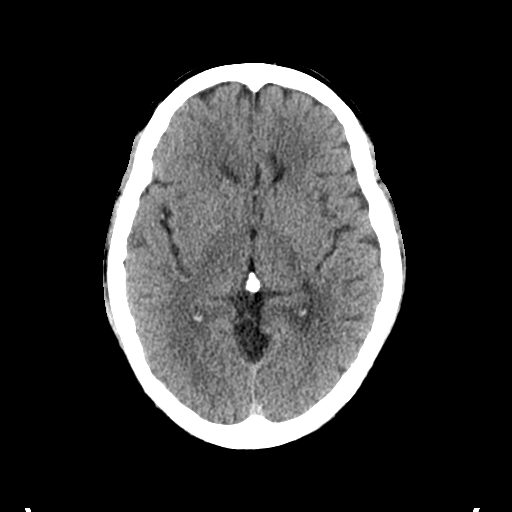
[im 14/31  bone]
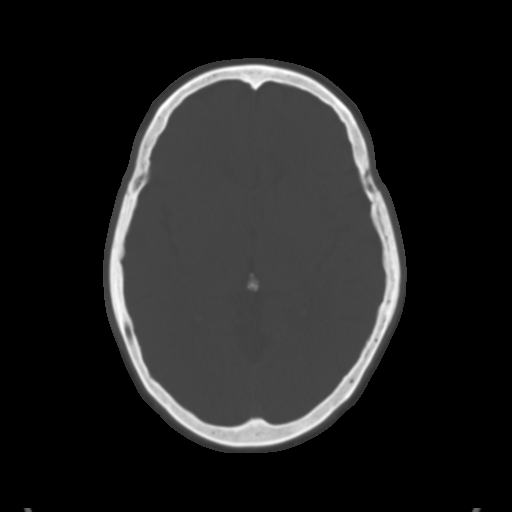
[im 17/31  brain]
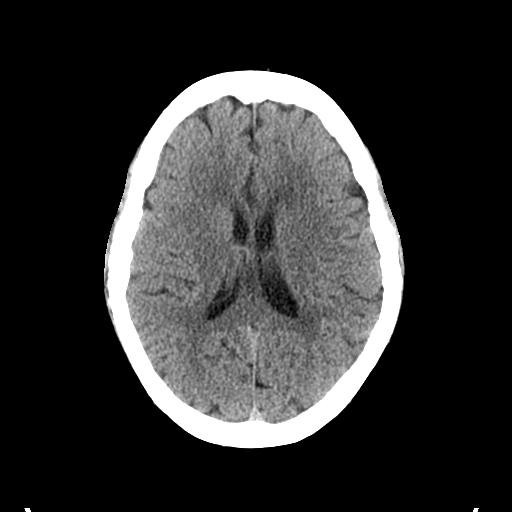
[im 20/31  brain]
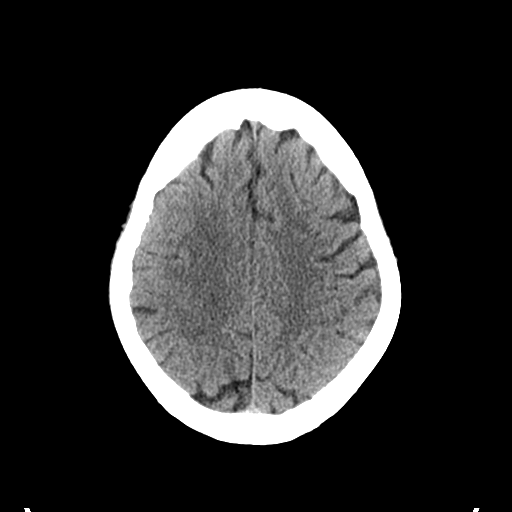
[im 23/31  brain]
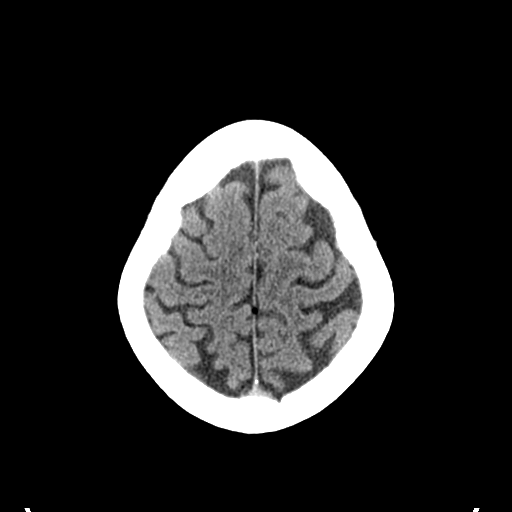
[im 25/31  brain]
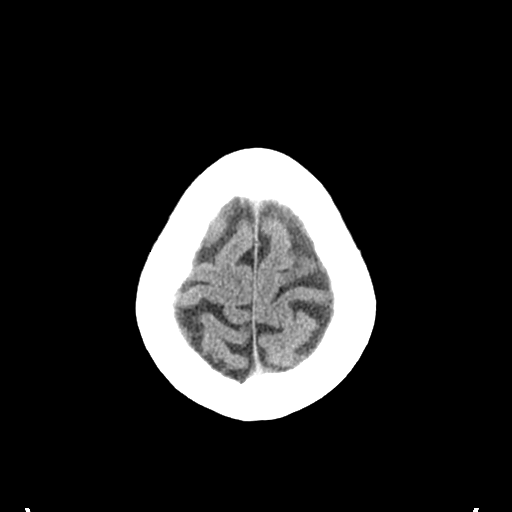
[im 25/31  bone]
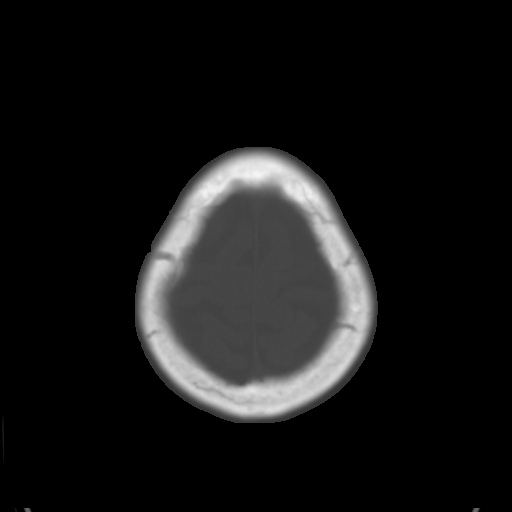
[im 28/31  brain]
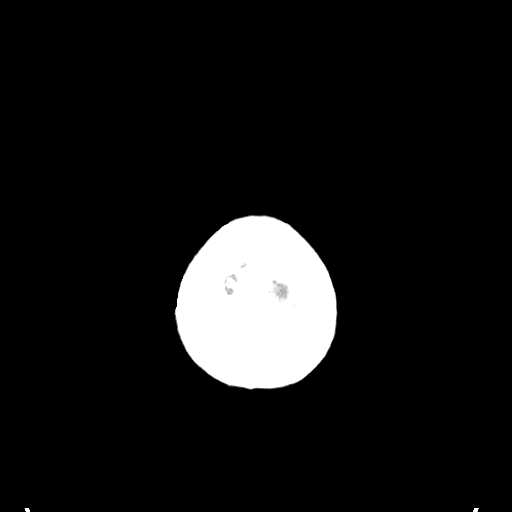

[Series 4: coronal soft · coronal · 0.31mm/px · 3 of 71 slices shown]
[im 24/71  brain]
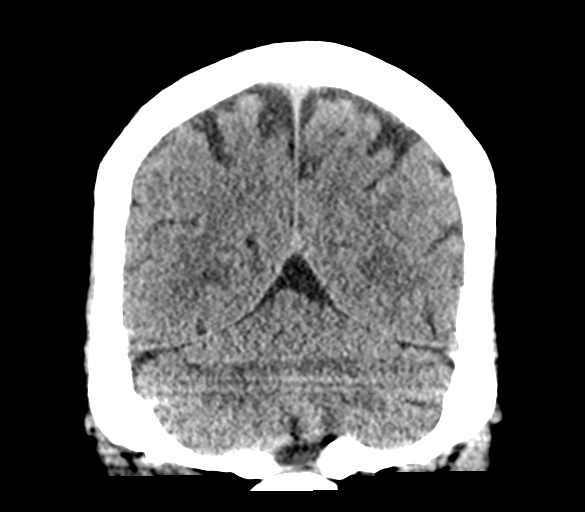
[im 32/71  brain]
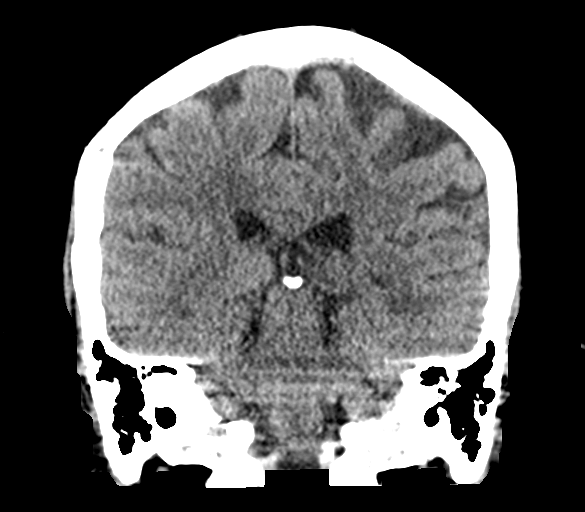
[im 39/71  brain]
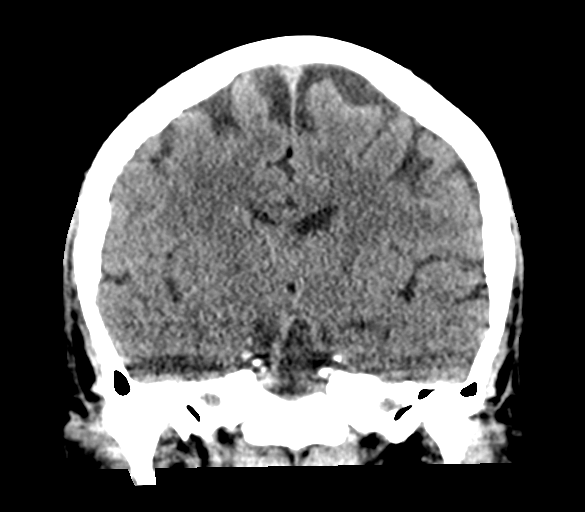

[Series 5: sagittal soft · sagittal · 0.33mm/px · 3 of 60 slices shown]
[im 20/60  brain]
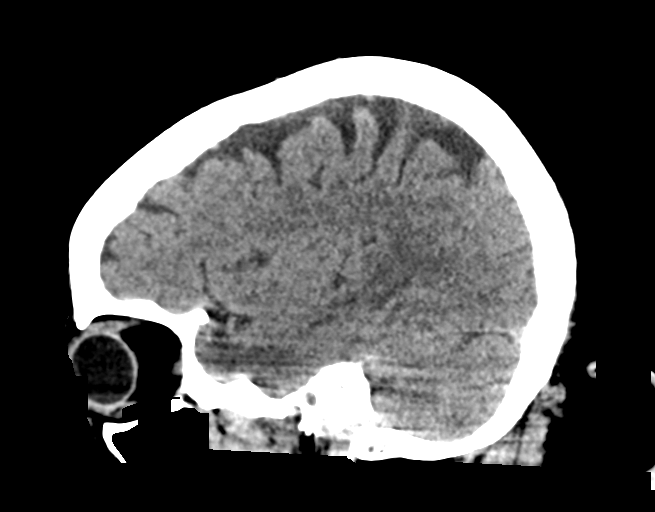
[im 30/60  brain]
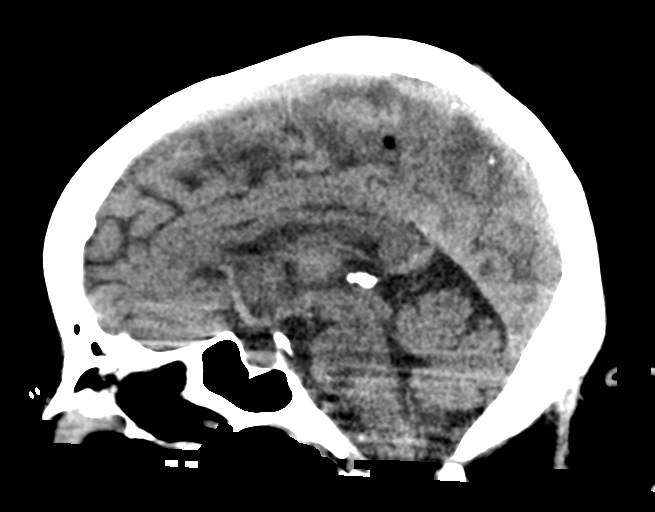
[im 40/60  brain]
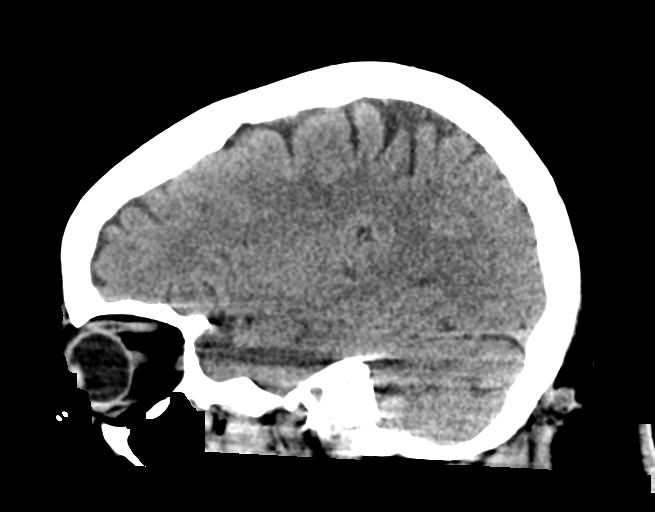

[16 of 47 positions shown; findings below may reference images not displayed]

FINDINGS: Brain: No evidence of acute intracranial hemorrhage or extra-axial
collection.No evidence of mass lesion/concern mass effect.The
ventricles are normal in size.

Vascular: No hyperdense vessel or unexpected calcification.

Skull: Normal. Negative for fracture or focal lesion.

Sinuses/Orbits: Mild right maxillary sinus mucosal thickening.

Other: None.
IMPRESSION: No acute intracranial abnormality.

## 2020-12-20 MED ORDER — LORAZEPAM 2 MG/ML IJ SOLN
1.0000 mg | Freq: Once | INTRAMUSCULAR | Status: AC
  Administered 2020-12-20: 1 mg via INTRAVENOUS
  Filled 2020-12-20: qty 1

## 2020-12-20 MED ORDER — CEPHALEXIN 500 MG PO CAPS: 500.0000 mg | ORAL_CAPSULE | Freq: Two times a day (BID) | ORAL | 0 refills | Status: AC

## 2020-12-20 NOTE — ED Triage Notes (Signed)
States she recently had a stroke and is currently taking 2 blood thinners, states her last known well was 3 days ago. Denies pain.

## 2020-12-20 NOTE — Discharge Instructions (Signed)
Lab work and imaging are all reassuring, please continue with the medications prescribed you for your stroke.  I would like to follow-up with neurology for further evaluation.  Your urine was slightly atypical I have started you on antibiotics please take as prescribed, please follow your PCP for further evaluation.  Come back to the emergency department if you develop chest pain, shortness of breath, severe abdominal pain, uncontrolled nausea, vomiting, diarrhea.

## 2020-12-20 NOTE — ED Provider Notes (Signed)
[facility] EMERGENCY DEPARTMENT Provider Note   CSN: [id number] Arrival date & time: 12/20/20  1547     History No chief complaint on file.   [name] is a 65 y.o. female.  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 fib on Eliquis presents with chief complaint of left-sided facial drooping.  Patient states this started around 2 PM today, last about 20 minutes then resolve on its own.  She notes that she will occasionally have some blurry vision and this has been going on since her stroke, she currently has none of this at this time, she denies any headaches, change in vision, paresthesia or weakness upper /lower extremities.  She denies slurring her words, forgetting things.  She does not endorse chest pain, shortness of breath, feeling lightheaded, dizziness, peripheral edema or generalized weakness.  She does note that she has been having increased urinary frequency,  denies flank pain, stomach pain, dysuria, hematuria, vaginal discharge vaginal bleeding.  She has no other complaints.  Family was at bedside able to validate the story, she states that patient had some left-sided facial droop, this resolved on its own, was not slurring her words, had no difficulty with word finding, no unilateral weakness, no gait disturbance.  She states that she is at her baseline at the moment.  Past Medical History:  Diagnosis Date   Arthritis    Asthma    CHF (congestive heart failure) (HCC)    Coronary artery disease    Diabetes mellitus    Diabetic neuropathy (HCC)    GERD (gastroesophageal reflux disease)    History of hiatal hernia    Hx of cardiovascular stress test    Lexiscan Myoview (10/15):  Medium area of ischemia in AL and IL distribution, cannot completely exclude shifting breast attenuation, EF 64%; Abnormal study   Hyperlipidemia     Hypertension    Morbid obesity ([place])    Sleep apnea    does not wear CPAP    Patient Active Problem List   Diagnosis Date Noted   Paroxysmal atrial fibrillation with RVR ([place]) 11/28/2020   Hypertensive urgency 11/28/2020   Syncope and collapse 11/28/2020   Nausea 11/28/2020   Hypokalemia 11/28/2020   Hyperglycemia due to diabetes mellitus ([place]) 11/28/2020   GERD (gastroesophageal reflux disease) 11/28/2020   Asthma 11/28/2020   Coronary atherosclerosis of native coronary artery 07/17/2013   Mixed hyperlipidemia 07/17/2013   Essential hypertension, benign 07/17/2013   Obesity 08/11/2011    Past Surgical History:  Procedure Laterality Date   [place]   right   COLONOSCOPY     DILATION AND CURETTAGE OF UTERUS     HAND SURGERY     right   heart stent  2007   PARS PLANA VITRECTOMY Left 03/24/2017   Procedure: PARS PLANA VITRECTOMY WITH 25 GAUGE;  Surgeon: Hurman Horn, MD;  Location: [place];  Service: Ophthalmology;  Laterality: Left;   UTERINE FIBROID SURGERY  2008     OB History   No obstetric history on file.     Family History  Problem Relation Age of Onset   Kidney disease Father    Stroke Father    Heart attack Father    Stroke Maternal Grandfather    Stroke Paternal Grandfather    Asthma Other    Hypertension Other    Diabetes Other  Social History   Tobacco Use   Smoking status: Former    Types: Cigarettes    Quit date: 08/07/1982    Years since quitting: 38.3   Smokeless tobacco: Never  Vaping Use   Vaping Use: Never used  Substance Use Topics   Alcohol use: No   Drug use: No    Home Medications Prior to Admission medications   Medication Sig Start Date End Date Taking? Authorizing Provider  cephALEXin (KEFLEX) 500 MG capsule Take 1 capsule (500 mg total) by mouth 2 (two) times daily for 7 days. 12/20/20 12/27/20 Yes Marcello Fennel, PA-C  albuterol (PROVENTIL HFA;VENTOLIN HFA) 108 (90 Base) MCG/ACT inhaler Inhale  1-2 puffs into the lungs every 6 (six) hours as needed for wheezing or shortness of breath. 05/24/17   Long, Wonda Olds, MD  albuterol (PROVENTIL) (2.5 MG/3ML) 0.083% nebulizer solution Take 3 mLs (2.5 mg total) by nebulization every 6 (six) hours as needed for wheezing or shortness of breath. 05/28/20   Faustino Congress, NP  ALPRAZolam Duanne Moron) 0.5 MG tablet Take 0.5 mg by mouth 2 (two) times daily as needed. 11/21/20   [provider]  amLODipine (NORVASC) 10 MG tablet Take 1 tablet by mouth once daily 03/04/20   Jettie Booze, MD  apixaban (ELIQUIS) 5 MG TABS tablet Take 1 tablet (5 mg total) by mouth 2 (two) times daily. 11/29/20 02/27/21  Barb Merino, MD  aspirin EC 81 MG tablet Take 1 tablet (81 mg total) by mouth daily. 03/03/19   Jettie Booze, MD  BREO ELLIPTA 100-25 MCG/INH AEPB Inhale 1 puff into the lungs daily. 09/21/16   [provider]  carvedilol (COREG) 6.25 MG tablet Take 1 tablet by mouth twice daily 12/19/20   Jettie Booze, MD  dexlansoprazole (DEXILANT) 60 MG capsule Take 1 capsule (60 mg total) by mouth daily. 03/03/19   Jettie Booze, MD  escitalopram (LEXAPRO) 5 MG tablet Take 5 mg by mouth daily. 11/22/20   [provider]  FREESTYLE LITE test strip 1 each by Other route as directed.  07/08/11   [provider]  furosemide (LASIX) 40 MG tablet Take 2 tablets by mouth once daily 12/19/20   Jettie Booze, MD  HUMULIN R 500 UNIT/ML SOLN injection Inject 80-110 Units into the skin 2 (two) times daily with a meal. 80 units in the morning and 40 every evening. Sliding Scale. 12/25/13   [provider]  lisinopril (ZESTRIL) 40 MG tablet Take 1 tablet by mouth once daily 03/04/20   Jettie Booze, MD  nitroGLYCERIN (NITROSTAT) 0.4 MG SL tablet Place 1 tablet (0.4 mg total) under the tongue every 5 (five) minutes as needed for chest pain (X 3 DOSES FOR CHEST PAIN). 11/30/17   Jettie Booze, MD   ondansetron (ZOFRAN-ODT) 8 MG disintegrating tablet Take 1 tablet (8 mg total) by mouth every 8 (eight) hours as needed for nausea or vomiting. 05/12/20   Scot Jun, FNP  potassium chloride (KLOR-CON) 10 MEQ tablet Take 2 tablets (20 mEq total) by mouth daily. Patient taking differently: Take 20 mEq by mouth 2 (two) times daily. 03/04/20   Jettie Booze, MD  rosuvastatin (CRESTOR) 40 MG tablet Take 1 tablet by mouth once daily 03/04/20   Jettie Booze, MD  TRULICITY 1.5 UE/4.5WU SOPN Inject 1.5 mg into the skin every 7 (seven) days. 09/30/20   [provider]    Allergies    Bystolic [nebivolol hcl], Crestor [rosuvastatin], Sulfa  antibiotics, Metformin and related, and Other  Review of Systems   Review of Systems  Constitutional:  Negative for chills and fever.  HENT:  Negative for congestion.   Respiratory:  Negative for shortness of breath.   Cardiovascular:  Negative for chest pain.  Gastrointestinal:  Negative for abdominal pain.  Genitourinary:  Positive for frequency. Negative for dyspareunia, dysuria, enuresis and flank pain.  Musculoskeletal:  Negative for back pain.  Skin:  Negative for rash.  Neurological:  Negative for dizziness.  Hematological:  Does not bruise/bleed easily.   Physical Exam Updated Vital Signs BP 130/69 (BP Location: Right Arm)   Pulse 77   Temp 98 F (36.7 C) (Oral)   Resp 17   Ht 5\' 2"  (1.575 m)   Wt 130.5 kg   LMP  (LMP Unknown)   SpO2 95%   BMI 52.62 kg/m   Physical Exam Vitals and nursing note reviewed.  Constitutional:      General: She is not in acute distress.    Appearance: She is not ill-appearing.  HENT:     Head: Normocephalic and atraumatic.     Comments: No deformity to head present, no raccoon eyes or battle sign noted.    Nose: No congestion.  Eyes:     General: No visual field deficit.    Extraocular Movements: Extraocular movements intact.     Conjunctiva/sclera: Conjunctivae normal.      Pupils: Pupils are equal, round, and reactive to light.  Cardiovascular:     Rate and Rhythm: Normal rate and regular rhythm.     Pulses: Normal pulses.     Heart sounds: No murmur heard.   No friction rub. No gallop.  Pulmonary:     Effort: No respiratory distress.     Breath sounds: No wheezing, rhonchi or rales.  Abdominal:     Palpations: Abdomen is soft.     Tenderness: There is no abdominal tenderness. There is no right CVA tenderness or left CVA tenderness.  Musculoskeletal:     Cervical back: No tenderness.     Right lower leg: No edema.     Left lower leg: No edema.     Comments: Patient has full range of motion, 5 of 5 strength, neurovascular intact in the upper lower extremities.  Spine was nontender to palpation.  Skin:    General: Skin is warm and dry.  Neurological:     Mental Status: She is alert.     GCS: GCS eye subscore is 4. GCS verbal subscore is 5. GCS motor subscore is 6.     Cranial Nerves: Cranial nerves 2-12 are intact. No facial asymmetry.     Sensory: Sensation is intact.     Motor: No weakness.     Coordination: Romberg sign negative. Finger-Nose-Finger Test normal.     Comments: Cranial nerves II through XII are grossly intact, no difficult word finding, able to follow two-step commands, no slurring of words, no unilateral weakness present.  Psychiatric:        Mood and Affect: Mood normal.    ED Results / Procedures / Treatments   Labs (all labs ordered are listed, but only abnormal results are displayed) Labs Reviewed  URINALYSIS, ROUTINE W REFLEX MICROSCOPIC - Abnormal; Notable for the following components:      Result Value   APPearance HAZY (*)    Protein, ur 30 (*)    Leukocytes,Ua TRACE (*)    Bacteria, UA FEW (*)    All other components within  normal limits  URINE CULTURE  COMPREHENSIVE METABOLIC PANEL  CBC WITH DIFFERENTIAL/PLATELET  PROTIME-INR    EKG EKG Interpretation  Date/Time:  Friday December 20 2020 17:34:42  EST Ventricular Rate:  64 PR Interval:  152 QRS Duration: 85 QT Interval:  380 QTC Calculation: 392 R Axis:   21 Text Interpretation: Sinus rhythm Low voltage, precordial leads Since last tracing rate slower and now in Sinus rhythm Otherwise no significant change Confirmed by Daleen Bo [phone]) on 12/20/2020 5:39:28 PM  Radiology CT Head Wo Contrast  Result Date: 12/20/2020 CLINICAL DATA:  Transient ischemic attack (TIA) EXAM: CT HEAD WITHOUT CONTRAST TECHNIQUE: Contiguous axial images were obtained from the base of the skull through the vertex without intravenous contrast. COMPARISON:  Same day MRI FINDINGS: Brain: No evidence of acute intracranial hemorrhage or extra-axial collection.No evidence of mass lesion/concern mass effect.The ventricles are normal in size. Vascular: No hyperdense vessel or unexpected calcification. Skull: Normal. Negative for fracture or focal lesion. Sinuses/Orbits: Mild right maxillary sinus mucosal thickening. Other: None. IMPRESSION: No acute intracranial abnormality. Electronically Signed   By: Maurine Simmering M.D.   On: 12/20/2020 18:53   MR BRAIN WO CONTRAST  Result Date: 12/20/2020 CLINICAL DATA:  Transient ischemic attack EXAM: MRI HEAD WITHOUT CONTRAST TECHNIQUE: Multiplanar, multiecho pulse sequences of the brain and surrounding structures were obtained without intravenous contrast. COMPARISON:  11/28/2020 FINDINGS: Brain: No restricted diffusion to suggest acute infarct. No acute hemorrhage, mass, mass effect, or midline shift. T2 hyperintense signal in the periventricular white matter and pons, likely the sequela of chronic small vessel ischemic disease. Chronic infarcts in the bilateral basal ganglia, thalami, and cerebellar hemispheres. No hydrocephalus or extra-axial collection. Vascular: Normal flow voids. Skull and upper cervical spine: Normal marrow signal. Sinuses/Orbits: Mucosal thickening in the right maxillary sinus. Status post left lens  replacement. Other: The mastoids are well aerated. IMPRESSION: No acute intracranial process. No evidence of acute infarction or hemorrhage. Electronically Signed   By: Merilyn Baba M.D.   On: 12/20/2020 18:46    Procedures Procedures   Medications Ordered in ED Medications  LORazepam (ATIVAN) injection 1 mg (1 mg Intravenous Given 12/20/20 1757)    ED Course  I have reviewed the triage vital signs and the nursing notes.  Pertinent labs & imaging results that were available during my care of the patient were reviewed by me and considered in my medical decision making (see chart for details).    MDM Rules/Calculators/A&P                          Initial impression-presents with left facial asymmetry that is since resolved.  She is alert, does not appear acute chest and vital signs reassuring.  There are no focal deficits present on exam, no indication for activation of a code stroke.  Due to her history of strokes in the past will obtain MRI and CT head consult with neurology for further recommendations.  Work-up-CBC is unremarkable, CMP is unremarkable, prothrombin time INR unremarkable, UA shows trace leukocytes, white blood cells, few bacteria.  CT head negative for acute findings.  MRI brain without contrast negative for acute findings.  EKG sinus without signs of ischemia.  Reassessment-patient was updated lab work and imaging, she has no complaints, will continue to monitor.  Will consult with neurology for further recommendations.  Updated patient recommendation from neurology she is agreement this plan is ready for discharge.  Consult-spoke with Dr. Quinn Axe of neurology she feels  since symptoms have resolved, she is properly anticoagulated and on antiplatelet therapy, and had a work-up 3 weeks ago further work-up is not necessary at this time.  She may be discharged to follow-up with neurology.  Rule out-low suspicion for intracranial head bleed or mass as CT imaging is negative  for these findings.  Although suspicion for CVA as she has no focal deficits, CT scan negative for acute findings.  I have low suspicion for carotid or vertebral dissection as presentation atypical etiology.  I have low sent for meningitis as she has no meningeal sign.  Low suspicion for metabolic abnormality as CMP is unremarkable.  I have low suspicion for [place], kidney stone as she has no CVA tenderness, no flank pain, UA is negative for hematuria.  Plan-  Facial droop since resolved-possible she had another TIA, she is currently on dual antiplatelet therapy as well as Eliquis Plavix continue with this.  Follow-up with neurology for further evaluation. Urinary frequency-unclear etiology, UA slightly atypical, will culture urine, start her on antibiotics, follow-up with PCP for further evaluation.  Vital signs have remained stable, no indication for hospital admission.  Patient discussed with attending and they agreed with assessment and plan.  Patient given at home care as well strict return precautions.  Patient verbalized that they understood agreed to said plan.  Final Clinical Impression(s) / ED Diagnoses Final diagnoses:  Facial droop  Acute cystitis without hematuria    Rx / DC Orders ED Discharge Orders          Ordered    cephALEXin (KEFLEX) 500 MG capsule  2 times daily        12/20/20 2000             Marcello Fennel, PA-C 12/20/20 2001    Daleen Bo, MD 12/21/20 [phone]

## 2020-12-20 NOTE — ED Provider Notes (Signed)
  Face-to-face evaluation   History: She presents for evaluation of facial droop which started today.  She states that by the time first responders got there the drooping had improved.  She was recently hospitalized and discharged after an evaluation and treatment for stroke.  She denies fever, chills, cough, shortness of breath.  She has been "urinating constantly."  She denies fever, chills, focal or general weakness.  Physical exam: Obese, alert cooperative.  No dysarthria or aphasia.  No respiratory distress.  She is lucid.  Medical screening examination/treatment/procedure(s) were conducted as a shared visit with non-physician practitioner(s) and myself.  I personally evaluated the patient during the encounter    Daleen Bo, MD 12/21/20 1236

## 2020-12-22 LAB — URINE CULTURE: Culture: <10000 — AB

## 2020-12-28 ENCOUNTER — Ambulatory Visit (INDEPENDENT_AMBULATORY_CARE_PROVIDER_SITE_OTHER): Payer: 59

## 2020-12-28 ENCOUNTER — Other Ambulatory Visit: Payer: Self-pay

## 2020-12-28 ENCOUNTER — Ambulatory Visit
Admission: EM | Admit: 2020-12-28 | Discharge: 2020-12-28 | Disposition: A | Discharge status: Home / Self Care | Payer: 59 | Attending: Physician Assistant | Admitting: Physician Assistant

## 2020-12-28 DIAGNOSIS — R059 Cough, unspecified: Secondary | ICD-10-CM | POA: Diagnosis not present

## 2020-12-28 DIAGNOSIS — Z20822 Contact with and (suspected) exposure to covid-19: Secondary | ICD-10-CM

## 2020-12-28 DIAGNOSIS — J111 Influenza due to unidentified influenza virus with other respiratory manifestations: Secondary | ICD-10-CM | POA: Diagnosis not present

## 2020-12-28 DIAGNOSIS — R509 Fever, unspecified: Secondary | ICD-10-CM

## 2020-12-28 IMAGING — DX DG CHEST 2V
2 series · 2 of 2 positions shown · non-contrast
Comparison: [DATE]

CLINICAL DATA: Cough, fever

EXAM:
CHEST - 2 VIEW

[chest pa]
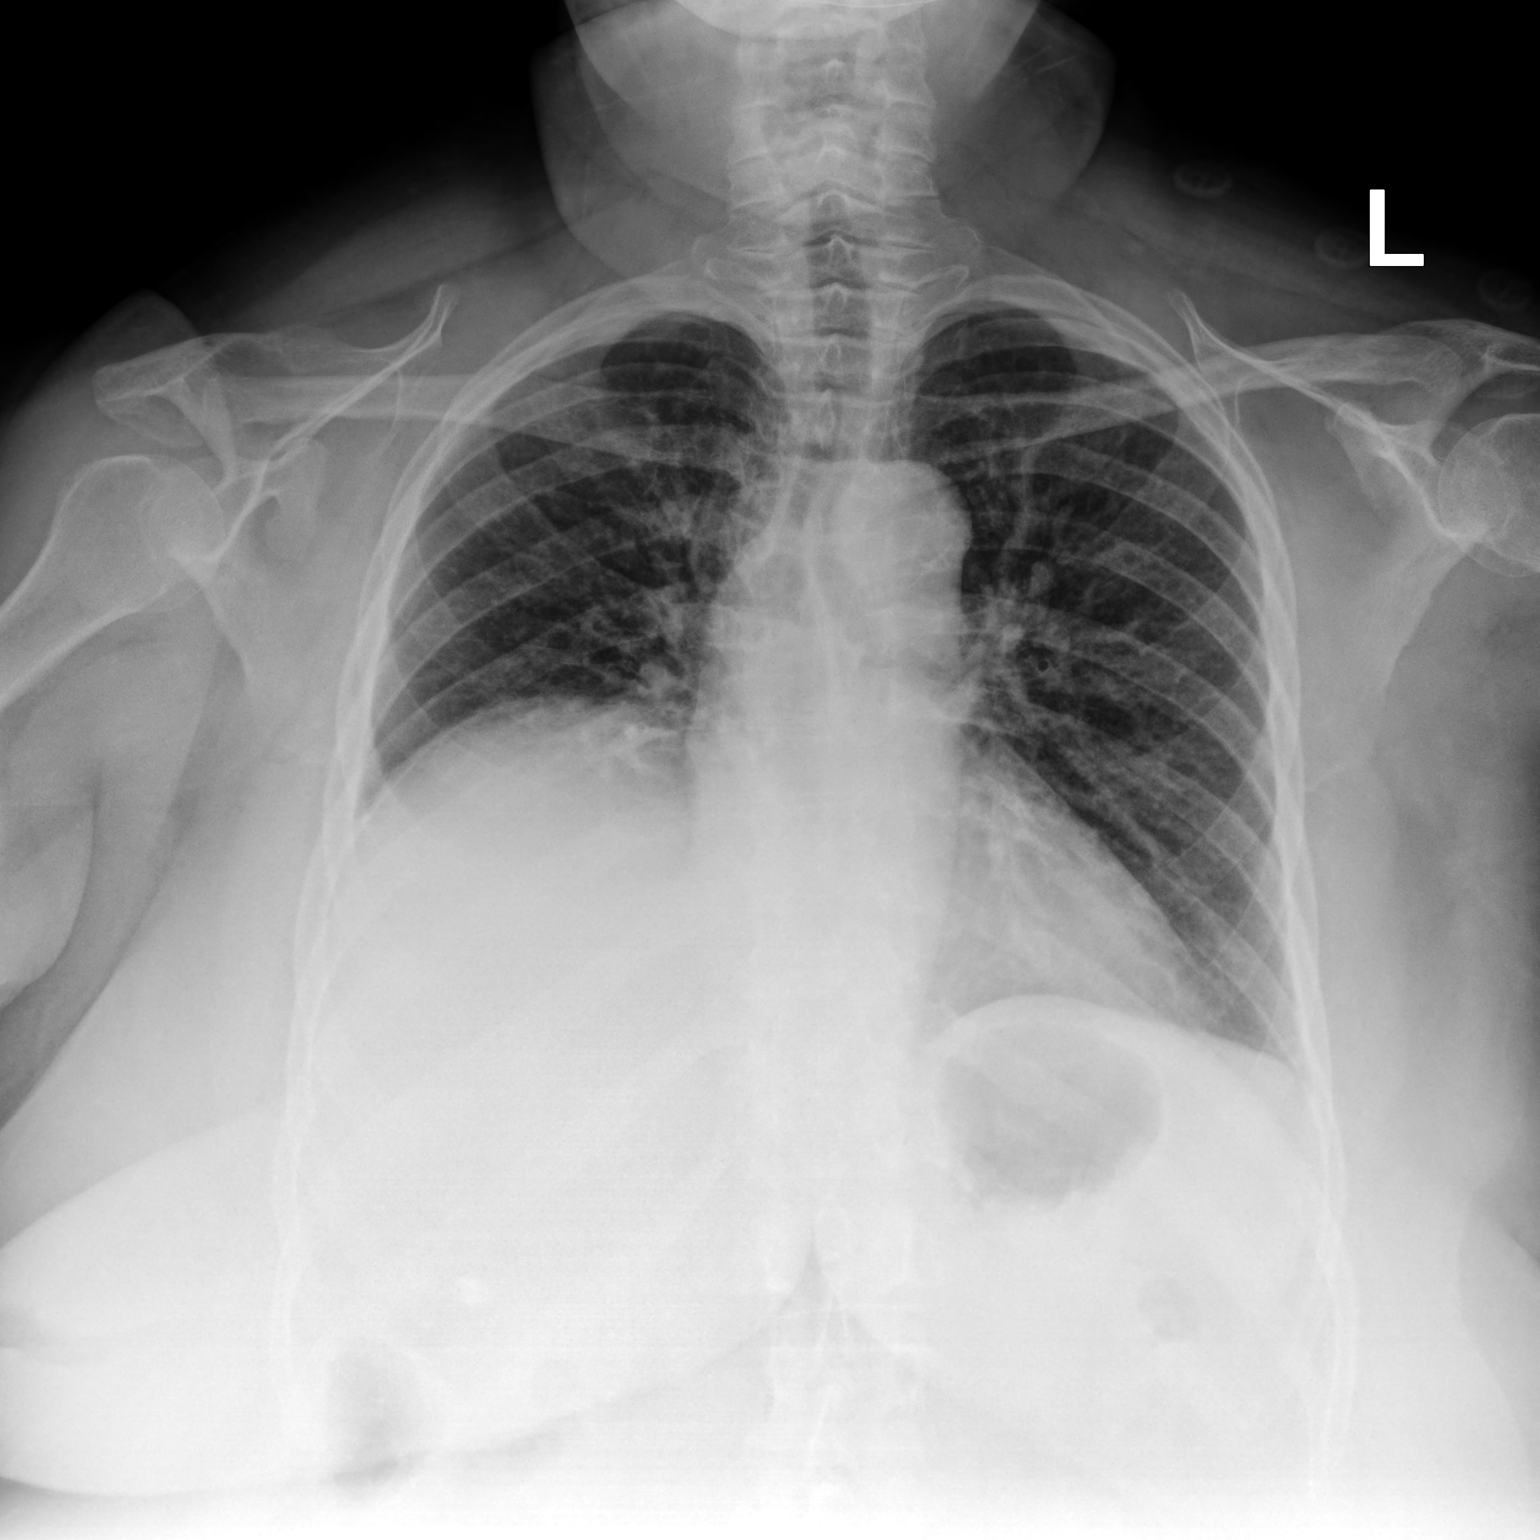

[chest lat]
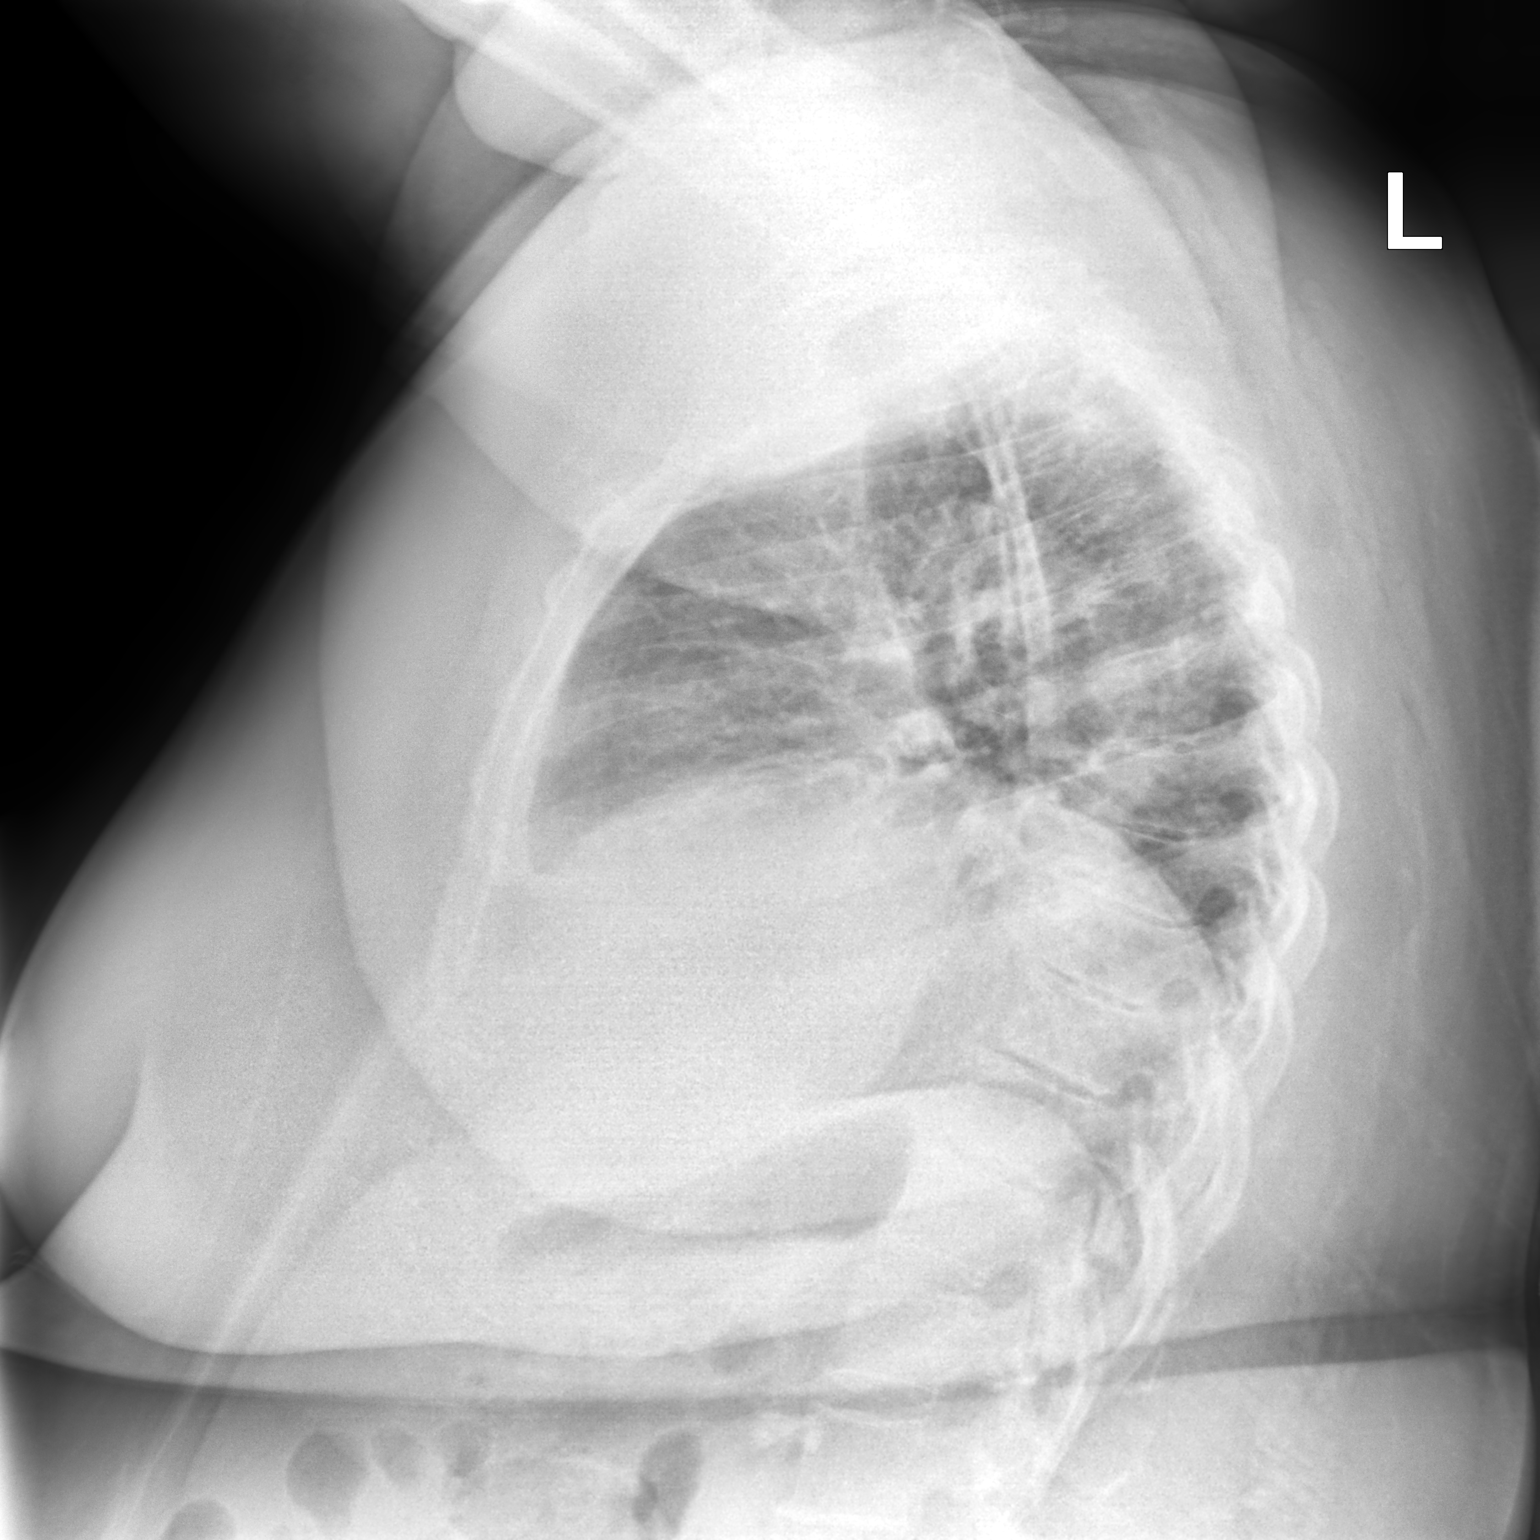

[2 of 2 positions shown; findings below may reference images not displayed]

FINDINGS: Heart size is normal. Atherosclerotic calcification of the aortic
knob. Persistent elevation of the right hemidiaphragm. No focal
airspace consolidation, pleural effusion, or pneumothorax.
IMPRESSION: No active cardiopulmonary disease.

## 2020-12-28 MED ORDER — BENZONATATE 100 MG PO CAPS: 100.0000 mg | ORAL_CAPSULE | Freq: Three times a day (TID) | ORAL | 0 refills | Status: DC | PRN

## 2020-12-28 MED ORDER — OSELTAMIVIR PHOSPHATE 75 MG PO CAPS: 75.0000 mg | ORAL_CAPSULE | Freq: Two times a day (BID) | ORAL | 0 refills | Status: DC

## 2020-12-28 NOTE — ED Triage Notes (Signed)
Patient states she had a headache all night last night. She has sweating, coughing and congestion. She took Mucinex last night and her pearls for coughing. She states she took the pearls and two tylenol this morning. 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  Denies Fever

## 2020-12-28 NOTE — ED Provider Notes (Signed)
[facility]    CSN: [id number] Arrival date & time: 12/28/20  0947      History   Chief Complaint No chief complaint on file.   HPI [name] is a 65 y.o. female.   Pt complains of headache, cough, and sweats that started last night.  Reports she is having back pain and headache today as well.  She has taken mucinex and tessalon pearls with some relief.  She has been taking tylenol for fever.  She reports she is around children who have been sick.  Denies n/v/d, shortness of breath.    Past Medical History:  Diagnosis Date   Arthritis    Asthma    CHF (congestive heart failure) (HCC)    Coronary artery disease    Diabetes mellitus    Diabetic neuropathy (HCC)    GERD (gastroesophageal reflux disease)    History of hiatal hernia    Hx of cardiovascular stress test    Lexiscan Myoview (10/15):  Medium area of ischemia in AL and IL distribution, cannot completely exclude shifting breast attenuation, EF 64%; Abnormal study   Hyperlipidemia    Hypertension    Morbid obesity ([name])    Sleep apnea    does not wear CPAP    Patient Active Problem List   Diagnosis Date Noted   Paroxysmal atrial fibrillation with RVR ([place]) 11/28/2020   Hypertensive urgency 11/28/2020   Syncope and collapse 11/28/2020   Nausea 11/28/2020   Hypokalemia 11/28/2020   Hyperglycemia due to diabetes mellitus ([name]) 11/28/2020   GERD (gastroesophageal reflux disease) 11/28/2020   Asthma 11/28/2020   Coronary atherosclerosis of native coronary artery 07/17/2013   Mixed hyperlipidemia 07/17/2013   Essential hypertension, benign 07/17/2013   Obesity 08/11/2011    Past Surgical History:  Procedure Laterality Date   [place]   right   COLONOSCOPY     DILATION AND CURETTAGE OF UTERUS     HAND SURGERY     right   heart stent  2007   PARS PLANA VITRECTOMY Left 03/24/2017   Procedure: PARS PLANA VITRECTOMY WITH 25 GAUGE;  Surgeon: Hurman Horn, MD;   Location: [facility];  Service: Ophthalmology;  Laterality: Left;   UTERINE FIBROID SURGERY  2008    OB History   No obstetric history on file.      Home Medications    Prior to Admission medications   Medication Sig Start Date End Date Taking? Authorizing Provider  benzonatate (TESSALON) 100 MG capsule Take 1 capsule (100 mg total) by mouth 3 (three) times daily as needed for cough. 12/28/20  Yes Ward, Lenise Arena, PA-C  oseltamivir (TAMIFLU) 75 MG capsule Take 1 capsule (75 mg total) by mouth every 12 (twelve) hours. 12/28/20  Yes Ward, Lenise Arena, PA-C  albuterol (PROVENTIL HFA;VENTOLIN HFA) 108 (90 Base) MCG/ACT inhaler Inhale 1-2 puffs into the lungs every 6 (six) hours as needed for wheezing or shortness of breath. 05/24/17   Long, Wonda Olds, MD  albuterol (PROVENTIL) (2.5 MG/3ML) 0.083% nebulizer solution Take 3 mLs (2.5 mg total) by nebulization every 6 (six) hours as needed for wheezing or shortness of breath. 05/28/20   Faustino Congress, NP  ALPRAZolam Duanne Moron) 0.5 MG tablet Take 0.5 mg by mouth 2 (two) times daily as needed. 11/21/20   [provider]  amLODipine (NORVASC) 10 MG tablet Take 1 tablet by mouth once daily 03/04/20   Jettie Booze, MD  apixaban (ELIQUIS) 5 MG TABS tablet Take  1 tablet (5 mg total) by mouth 2 (two) times daily. 11/29/20 02/27/21  Barb Merino, MD  aspirin EC 81 MG tablet Take 1 tablet (81 mg total) by mouth daily. 03/03/19   Jettie Booze, MD  BREO ELLIPTA 100-25 MCG/INH AEPB Inhale 1 puff into the lungs daily. 09/21/16   [provider]  carvedilol (COREG) 6.25 MG tablet Take 1 tablet by mouth twice daily 12/19/20   Jettie Booze, MD  dexlansoprazole (DEXILANT) 60 MG capsule Take 1 capsule (60 mg total) by mouth daily. 03/03/19   Jettie Booze, MD  escitalopram (LEXAPRO) 5 MG tablet Take 5 mg by mouth daily. 11/22/20   [provider]  FREESTYLE LITE test strip 1 each by Other route as directed.  07/08/11    [provider]  furosemide (LASIX) 40 MG tablet Take 2 tablets by mouth once daily 12/19/20   Jettie Booze, MD  HUMULIN R 500 UNIT/ML SOLN injection Inject 80-110 Units into the skin 2 (two) times daily with a meal. 80 units in the morning and 40 every evening. Sliding Scale. 12/25/13   [provider]  lisinopril (ZESTRIL) 40 MG tablet Take 1 tablet by mouth once daily 03/04/20   Jettie Booze, MD  nitroGLYCERIN (NITROSTAT) 0.4 MG SL tablet Place 1 tablet (0.4 mg total) under the tongue every 5 (five) minutes as needed for chest pain (X 3 DOSES FOR CHEST PAIN). 11/30/17   Jettie Booze, MD  ondansetron (ZOFRAN-ODT) 8 MG disintegrating tablet Take 1 tablet (8 mg total) by mouth every 8 (eight) hours as needed for nausea or vomiting. 05/12/20   Scot Jun, FNP  potassium chloride (KLOR-CON) 10 MEQ tablet Take 2 tablets (20 mEq total) by mouth daily. Patient taking differently: Take 20 mEq by mouth 2 (two) times daily. 03/04/20   Jettie Booze, MD  rosuvastatin (CRESTOR) 40 MG tablet Take 1 tablet by mouth once daily 03/04/20   Jettie Booze, MD  TRULICITY 1.5 IO/9.7DZ SOPN Inject 1.5 mg into the skin every 7 (seven) days. 09/30/20   [provider]    Family History Family History  Problem Relation Age of Onset   Kidney disease Father    Stroke Father    Heart attack Father    Stroke Maternal Grandfather    Stroke Paternal Grandfather    Asthma Other    Hypertension Other    Diabetes Other     Social History Social History   Tobacco Use   Smoking status: Former    Types: Cigarettes    Quit date: 08/07/1982    Years since quitting: 38.4   Smokeless tobacco: Never  Vaping Use   Vaping Use: Never used  Substance Use Topics   Alcohol use: No   Drug use: No     Allergies   Bystolic [nebivolol hcl], Crestor [rosuvastatin], Sulfa antibiotics, Metformin and related, and Other   Review of Systems Review of Systems   Constitutional:  Positive for chills and fever.  HENT:  Positive for congestion. Negative for ear pain and sore throat.   Eyes:  Negative for pain and visual disturbance.  Respiratory:  Positive for cough. Negative for shortness of breath.   Cardiovascular:  Negative for chest pain and palpitations.  Gastrointestinal:  Negative for abdominal pain and vomiting.  Genitourinary:  Negative for dysuria and hematuria.  Musculoskeletal:  Negative for arthralgias and back pain.  Skin:  Negative for color change and rash.  Neurological:  Negative for  seizures and syncope.  All other systems reviewed and are negative.   Physical Exam Triage Vital Signs ED Triage Vitals  Enc Vitals Group     BP 12/28/20 1252 (!) 162/92     Pulse Rate 12/28/20 1252 (!) 107     Resp 12/28/20 1252 18     Temp 12/28/20 1252 100.3 F (37.9 C)     Temp Source 12/28/20 1252 Oral     SpO2 12/28/20 1252 93 %     Weight --      Height --      Head Circumference --      Peak Flow --      Pain Score 12/28/20 1249 8     Pain Loc --      Pain Edu? --      Excl. in [place]? --    No data found.  Updated Vital Signs BP (!) 162/92 (BP Location: Right Arm)   Pulse (!) 107   Temp 100.3 F (37.9 C) (Oral)   Resp 18   LMP  (LMP Unknown)   SpO2 93%   Visual Acuity Right Eye Distance:   Left Eye Distance:   Bilateral Distance:    Right Eye Near:   Left Eye Near:    Bilateral Near:     Physical Exam Vitals and nursing note reviewed.  Constitutional:      General: She is not in acute distress.    Appearance: She is well-developed.  HENT:     Head: Normocephalic and atraumatic.  Eyes:     Conjunctiva/sclera: Conjunctivae normal.  Cardiovascular:     Rate and Rhythm: Normal rate and regular rhythm.     Heart sounds: No murmur heard. Pulmonary:     Effort: Pulmonary effort is normal. No respiratory distress.     Breath sounds: Examination of the right-middle field reveals decreased breath sounds. Examination  of the right-lower field reveals decreased breath sounds. Decreased breath sounds present.  Abdominal:     Palpations: Abdomen is soft.     Tenderness: There is no abdominal tenderness.  Musculoskeletal:        General: No swelling.     Cervical back: Neck supple.  Skin:    General: Skin is warm and dry.     Capillary Refill: Capillary refill takes less than 2 seconds.  Neurological:     Mental Status: She is alert.  Psychiatric:        Mood and Affect: Mood normal.     UC Treatments / Results  Labs (all labs ordered are listed, but only abnormal results are displayed) Labs Reviewed  COVID-19, FLU A+B NAA    EKG   Radiology No results found.  Procedures Procedures (including critical care time)  Medications Ordered in UC Medications - No data to display  Initial Impression / Assessment and Plan / UC Course  I have reviewed the triage vital signs and the nursing notes.  Pertinent labs & imaging results that were available during my care of the patient were reviewed by me and considered in my medical decision making (see chart for details).     Flu, tamiflu prescribed. Chest xray with no acute findings.  Pt stable for discharge, no acute distress.  Return precautions discussed. Tessalon and tamiflu prescribed.  Final Clinical Impressions(s) / UC Diagnoses   Final diagnoses:  Exposure to COVID-19 virus  Influenza with respiratory manifestation     Discharge Instructions      Take tamiflu as prescribed Take tessalon  pearls as needed for cough Can take Mucinex without the decongestant Return if symptoms become worse    ED Prescriptions     Medication Sig Dispense Auth. Provider   oseltamivir (TAMIFLU) 75 MG capsule Take 1 capsule (75 mg total) by mouth every 12 (twelve) hours. 10 capsule Ward, Janett Billow Z, PA-C   benzonatate (TESSALON) 100 MG capsule Take 1 capsule (100 mg total) by mouth 3 (three) times daily as needed for cough. 21 capsule Ward, Lenise Arena,  PA-C      PDMP not reviewed this encounter.   Ward, Lenise Arena, PA-C 12/28/20 1451

## 2020-12-28 NOTE — Discharge Instructions (Signed)
Take tamiflu as prescribed Take tessalon pearls as needed for cough Can take Mucinex without the decongestant Return if symptoms become worse

## 2020-12-29 LAB — COVID-19, FLU A+B NAA
Influenza A, NAA: DETECTED — AB
Influenza B, NAA: NOT DETECTED
SARS-CoV-2, NAA: NOT DETECTED

## 2020-12-30 ENCOUNTER — Ambulatory Visit: Payer: 59 | Admitting: Neurology

## 2020-12-30 ENCOUNTER — Other Ambulatory Visit: Payer: Self-pay

## 2020-12-30 ENCOUNTER — Encounter: Payer: Self-pay | Admitting: Neurology

## 2020-12-30 VITALS — BP 143/80 | HR 73 | Ht 62.0 in | Wt 283.0 lb

## 2020-12-30 DIAGNOSIS — R55 Syncope and collapse: Secondary | ICD-10-CM

## 2020-12-30 DIAGNOSIS — I6389 Other cerebral infarction: Secondary | ICD-10-CM

## 2020-12-30 NOTE — Progress Notes (Signed)
[facility]  PATIENT: [name] DOB: [birth year]  REQUESTING CLINICIAN: Barb Merino, MD HISTORY FROM: Patient and niece  REASON FOR VISIT: Left occipital stroke follow up.    HISTORICAL  CHIEF COMPLAINT:  Chief Complaint  Patient presents with   New Patient (Initial Visit)    Rm 29, with niece, NP Internal referral for syncope and collapse, last episode was 1 week ago, taking eliquis, asa, and plavix      HISTORY OF PRESENT ILLNESS:  This is a 65 year old woman with past medical history of atrial fibrillation, hypertension, hyperlipidemia, obesity, diabetes mellitus type 2, anxiety and asthma who is presenting after recent admission for facial droop and syncope and found to have a 100mm left occipital stroke.  Patient was at her psychiatrist office when she started complaining of tingling of left side of face and around her left eye who directed her to the ED.  In the ED she was noted to be hypertensive in the 683 systolic and reported had a syncopal episode with urinary incontinence when the phlebotomist entered the room.  She was found to be in A. Fib with RVR and had a MRI brain which showed a 3 mm acute left occipital stroke.  She was noted to have no neurological deficit from the left acute stroke.  She had a carotid ultrasound which did not show any high-grade stenosis, her echocardiogram showed normal ventricular function no thrombus noted.  She was started on aspirin and Eliquis.  Her A1c was 9 and she was started on insulin.  Patient report prior to going to the hospital she stopped taking her aspirin and Plavix (she had cardiac stents) but on discharge was also was only put back on aspirin no Plavix.  She is supposed to follow-up with a cardiologist in 2 days.   Since discharge, she has been complaining of blurred vision, nausea but no vomiting. She also reports increase anxiety.    OTHER MEDICAL CONDITIONS: Atrial fibrillation, HTN, HLD, Obesity,  Anxiety DMII, and Asthma    REVIEW OF SYSTEMS: Full 14 system review of systems performed and negative with exception of: as noted in the HPI   ALLERGIES: Allergies  Allergen Reactions   Bystolic [Nebivolol Hcl] Palpitations   Crestor [Rosuvastatin] Other (See Comments)    Couldn't tolerate Crestor 40 mg dose    Sulfa Antibiotics Anaphylaxis   Metformin And Related Diarrhea   Other     Ok [place] anything that has this in it makes her wheeze    HOME MEDICATIONS: Outpatient Medications Prior to Visit  Medication Sig Dispense Refill   albuterol (PROVENTIL HFA;VENTOLIN HFA) 108 (90 Base) MCG/ACT inhaler Inhale 1-2 puffs into the lungs every 6 (six) hours as needed for wheezing or shortness of breath. 1 Inhaler 0   albuterol (PROVENTIL) (2.5 MG/3ML) 0.083% nebulizer solution Take 3 mLs (2.5 mg total) by nebulization every 6 (six) hours as needed for wheezing or shortness of breath. 75 mL 12   ALPRAZolam (XANAX) 0.5 MG tablet Take 0.5 mg by mouth 2 (two) times daily as needed.     amLODipine (NORVASC) 10 MG tablet Take 1 tablet by mouth once daily 90 tablet 1   apixaban (ELIQUIS) 5 MG TABS tablet Take 1 tablet (5 mg total) by mouth 2 (two) times daily. 180 tablet 0   aspirin EC 81 MG tablet Take 1 tablet (81 mg total) by mouth daily. 90 tablet 3   benzonatate (TESSALON) 100 MG capsule Take 1 capsule (100 mg total) by  mouth 3 (three) times daily as needed for cough. 21 capsule 0   BREO ELLIPTA 100-25 MCG/INH AEPB Inhale 1 puff into the lungs daily.     carvedilol (COREG) 6.25 MG tablet Take 1 tablet by mouth twice daily 180 tablet 0   clopidogrel (PLAVIX) 75 MG tablet Take 75 mg by mouth daily.     dexlansoprazole (DEXILANT) 60 MG capsule Take 1 capsule (60 mg total) by mouth daily. 90 capsule 3   escitalopram (LEXAPRO) 5 MG tablet Take 5 mg by mouth daily.     FREESTYLE LITE test strip 1 each by Other route as directed.      furosemide (LASIX) 40 MG tablet Take 2 tablets by mouth once daily 60  tablet 2   HUMULIN R 500 UNIT/ML SOLN injection Inject 80-110 Units into the skin 2 (two) times daily with a meal. 80 units in the morning and 40 every evening. Sliding Scale.     lisinopril (ZESTRIL) 40 MG tablet Take 1 tablet by mouth once daily 90 tablet 1   nitroGLYCERIN (NITROSTAT) 0.4 MG SL tablet Place 1 tablet (0.4 mg total) under the tongue every 5 (five) minutes as needed for chest pain (X 3 DOSES FOR CHEST PAIN). 25 tablet 3   ondansetron (ZOFRAN-ODT) 8 MG disintegrating tablet Take 1 tablet (8 mg total) by mouth every 8 (eight) hours as needed for nausea or vomiting. 30 tablet 0   oseltamivir (TAMIFLU) 75 MG capsule Take 1 capsule (75 mg total) by mouth every 12 (twelve) hours. 10 capsule 0   potassium chloride (KLOR-CON) 10 MEQ tablet Take 2 tablets (20 mEq total) by mouth daily. (Patient taking differently: Take 20 mEq by mouth 2 (two) times daily.) 120 tablet 1   rosuvastatin (CRESTOR) 40 MG tablet Take 1 tablet by mouth once daily 90 tablet 1   TRULICITY 1.5 HF/0.2OV SOPN Inject 1.5 mg into the skin every 7 (seven) days.     No facility-administered medications prior to visit.    PAST MEDICAL HISTORY: Past Medical History:  Diagnosis Date   Arthritis    Asthma    CHF (congestive heart failure) ([place])    Coronary artery disease    Diabetes mellitus    Diabetic neuropathy (HCC)    GERD (gastroesophageal reflux disease)    History of hiatal hernia    Hx of cardiovascular stress test    Lexiscan Myoview (10/15):  Medium area of ischemia in AL and IL distribution, cannot completely exclude shifting breast attenuation, EF 64%; Abnormal study   Hyperlipidemia    Hypertension    Morbid obesity ([place])    Sleep apnea    does not wear CPAP    PAST SURGICAL HISTORY: Past Surgical History:  Procedure Laterality Date   ACHILLES TENDON REPAIR  1995   right   COLONOSCOPY     DILATION AND CURETTAGE OF UTERUS     HAND SURGERY     right   heart stent  2007   PARS PLANA  VITRECTOMY Left 03/24/2017   Procedure: PARS PLANA VITRECTOMY WITH 25 GAUGE;  Surgeon: Hurman Horn, MD;  Location: [facility];  Service: Ophthalmology;  Laterality: Left;   UTERINE FIBROID SURGERY  2008    FAMILY HISTORY: Family History  Problem Relation Age of Onset   Kidney disease Father    Stroke Father    Heart attack Father    Stroke Maternal Grandfather    Stroke Paternal Grandfather    Asthma Other    Hypertension Other  Diabetes Other     SOCIAL HISTORY: Social History   Socioeconomic History   Marital status: Single    Spouse name: Not on file   Number of children: Not on file   Years of education: Not on file   Highest education level: Not on file  Occupational History   Not on file  Tobacco Use   Smoking status: Former    Types: Cigarettes    Quit date: 08/07/1982    Years since quitting: 38.4   Smokeless tobacco: Never  Vaping Use   Vaping Use: Never used  Substance and Sexual Activity   Alcohol use: No   Drug use: No   Sexual activity: Not on file  Other Topics Concern   Not on file  Social History Narrative   Not on file   Social Determinants of Health   Financial Resource Strain: Not on file  Food Insecurity: Not on file  Transportation Needs: Not on file  Physical Activity: Not on file  Stress: Not on file  Social Connections: Not on file  Intimate Partner Violence: Not on file     PHYSICAL EXAM  GENERAL EXAM/CONSTITUTIONAL: Vitals:  Vitals:   12/30/20 0852  BP: (!) 143/80  Pulse: 73  Weight: 283 lb (128.4 kg)  Height: 5\' 2"  (1.575 m)   Body mass index is 51.76 kg/m. Wt Readings from Last 3 Encounters:  12/30/20 283 lb (128.4 kg)  12/20/20 287 lb 11.2 oz (130.5 kg)  11/28/20 287 lb 11.2 oz (130.5 kg)   Patient is in no distress; well developed, nourished and groomed; neck is supple, obese woman   CARDIOVASCULAR: Examination of carotid arteries is normal; no carotid bruits Regular rate and rhythm, no murmurs Examination of  peripheral vascular system by observation and palpation is normal  EYES: Pupils round and reactive to light, Visual fields full to confrontation, Extraocular movements intacts,   MUSCULOSKELETAL: Gait, strength, tone, movements noted in Neurologic exam below  NEUROLOGIC: MENTAL STATUS:  No flowsheet data found. awake, alert, oriented to person, place and time recent and remote memory intact normal attention and concentration language fluent, comprehension intact, naming intact fund of knowledge appropriate  CRANIAL NERVE:  2nd, 3rd, 4th, 6th - pupils equal and reactive to light, visual fields full to confrontation, extraocular muscles intact, no nystagmus 5th - facial sensation symmetric 7th - facial strength symmetric 8th - hearing intact 9th - palate elevates symmetrically, uvula midline 11th - shoulder shrug symmetric 12th - tongue protrusion midline  MOTOR:  normal bulk and tone, full strength in the BUE, BLE  SENSORY:  normal and symmetric to light touch, pinprick, temperature, vibration  COORDINATION:  finger-nose-finger, fine finger movements normal  REFLEXES:  deep tendon reflexes present and symmetric  GAIT/STATION:  normal   DIAGNOSTIC DATA (LABS, IMAGING, TESTING) - I reviewed patient records, labs, notes, testing and imaging myself where available.  Lab Results  Component Value Date   WBC 7.1 12/20/2020   HGB 14.1 12/20/2020   HCT 40.7 12/20/2020   MCV 80.1 12/20/2020   PLT 317 12/20/2020      Component Value Date/Time   NA 138 12/20/2020 1730   NA 138 03/11/2020 0737   K 3.8 12/20/2020 1730   CL 102 12/20/2020 1730   CO2 28 12/20/2020 1730   GLUCOSE 72 12/20/2020 1730   BUN 9 12/20/2020 1730   BUN 12 03/11/2020 0737   CREATININE 0.73 12/20/2020 1730   CREATININE 0.69 01/05/2012 1629   CALCIUM 9.6 12/20/2020 1730  PROT 7.9 12/20/2020 1730   PROT 7.6 06/01/2019 0810   ALBUMIN 3.9 12/20/2020 1730   ALBUMIN 4.1 06/01/2019 0810   AST 17  12/20/2020 1730   ALT 16 12/20/2020 1730   ALKPHOS 88 12/20/2020 1730   BILITOT 0.6 12/20/2020 1730   BILITOT 0.4 06/01/2019 0810   GFRNONAA >60 12/20/2020 1730   GFRAA 105 03/11/2020 0737   Lab Results  Component Value Date   CHOL 169 11/29/2020   HDL 33 (L) 11/29/2020   LDLCALC 119 (H) 11/29/2020   TRIG 83 11/29/2020   CHOLHDL 5.1 11/29/2020   Lab Results  Component Value Date   HGBA1C 9.0 (H) 11/28/2020   No results found for: VITAMINB12 Lab Results  Component Value Date   TSH 3.797 11/27/2020    MRI Brain 11/28/2020 3 mm acute infarct left occipital lobe Moderate chronic ischemic changes.  MRI Brain 12/20/2020 No acute intracranial process. No evidence of acute infarction or hemorrhage.  Bilateral carotid US 10/22 Mild bilateral carotid atherosclerosis. Negative for stenosis.nDegree of narrowing less than 50% bilaterally by ultrasound criteria. Patent antegrade vertebral flow bilaterally  Echo 11/28/20 1. Left ventricular ejection fraction, by estimation, is 60 to 65%. The left ventricle has normal function. The left ventricle has no regional wall motion abnormalities. There is moderate concentric left ventricular hypertrophy. Left ventricular diastolic parameters are consistent with Grade I diastolic dysfunction (impaired relaxation). Elevated left ventricular end-diastolic pressure.  2. RV-RA gradient normal at 27 mmHg suggesting normal RVSP. Right ventricular systolic function is normal. The right ventricular size is normal.  3. The mitral valve is grossly normal. Trivial mitral valve regurgitation.  4. The aortic valve is tricuspid. Aortic valve regurgitation is not visualized. Aortic valve mean gradient measures 5.0 mmHg.  5. Unable to estimate CVP.    ASSESSMENT AND PLAN  65 y.o. year old female with vascular risk factor including diabetes mellitus, hypertension, hyperlipidemia, coronary artery disease and obesity who is presenting after discharge from the  hospital for a 3 mm left occipital stroke.  Stroke etiology likely small vessel disease, she was also found to have atrial fibrillation and now on Eliquis.  Her LDL was elevated therefore started on statin.  At this time, I will continue all of her medications.  She did have multiple stents placed and have a cardiology follow-up in 2 days.  I will defer the decision to restart Plavix to the cardiologist. There was also report of syncope before her blood drawn with urinary incontinence, there was no abnormal movements noted.  I will order a routine EEG to evaluate for seizures.  I will contact the patient to go over the EEG result otherwise I will see her in 1 year for follow-up   1. Cerebrovascular accident (CVA) due to other mechanism ([place])   2. Syncope, unspecified syncope type     PLAN: Continue your current medications including Aspirin and Eliquis  Follow up with Cardiology to inquire if you should go back on Plavix  Routine EEG  Follow up with your doctor as scheduled  Return in 1 year   Orders Placed This Encounter  Procedures   Ambulatory referral to Physical Therapy   EEG adult     No orders of the defined types were placed in this encounter.   Return in about 1 year (around 12/30/2021).    Alric Ran, MD 12/30/2020, 3:04 PM  [facility] [address] [place], [place] [address] [phone]

## 2020-12-30 NOTE — Patient Instructions (Addendum)
Continue your current medications including Aspirin and Eliquis  Follow up with Cardiology to inquire if you should go back on Plavix  Routine EEG  Follow up with your doctor as scheduled  Return in 1 year

## 2020-12-31 ENCOUNTER — Ambulatory Visit: Payer: 59 | Admitting: Neurology

## 2020-12-31 ENCOUNTER — Encounter: Payer: Self-pay | Admitting: Physician Assistant

## 2020-12-31 DIAGNOSIS — R55 Syncope and collapse: Secondary | ICD-10-CM

## 2020-12-31 NOTE — Progress Notes (Signed)
Cardiology Office Note    Date:  01/01/2021   ID:  [name], DOB 07/22/1955, MRN [id number]  PCP:  Carol Ada, MD  Cardiologist:  Larae Grooms, MD  Electrophysiologist:  None   Chief Complaint: f/u atrial fib, also hx CAD  History of Present Illness:   [name] is a 65 y.o. female with history of CAD (prior LAD stent 2008), morbid obesity, OSA (did not tolerate CPAP), arthritis, asthma, DM with neuropathy, hiatal hernia, HTN, HLD, asthma, recently diagnosed atrial fib, stroke, mild carotid artery disease who presents for follow-up. She historically follows with Dr. Irish Lack with above cardiac history. Last cath was in 2012 with patent LAD stent otherwise no significant disease, EF normal. She had a nuc in 2015 felt to be equivocal but possibly representing breast attenuation. More recently she was admitted to the hospital in 11/2020 with  elevated BP, headache, and tingling in her face. While in the ED she had an episode of unresponsiveness lasting 2 minutes with associated urinary incontinence but she did not have any tonic-clonic activity. She was initially in normal sinus rhythm but did have atrial fibrillation with RVR following this episode. Per notes was not on a monitor at the actual time of syncope. She spontaneously converted on IV diltiazem. CT head was nonacute but MRI confirmed stroke. Plavix was stopped by our team since she was being placed on Eliquis. 2D echo showed EF 60-65%, grade 1 DD, no significant valve disease. Carotid US showed only mild bilateral stenosis. Given the syncope, 14 day monitor was arranged and is in progress. She was seen back in the ED 12/20/20 with recurrent facial droop symptoms, diagnosed with UTI and advised to f/u neurology which she did 12/30/20. Of note she also went to urgent care 11/19 with ILI symptoms starting the day prior and tested positive for flu A.  She is seen back for follow-up today with her niece who is taking  excellent notes. In general she has done well from a cardiac standpoint since the hospitalization without any CP, dyspnea, edema, palpitations, dizziness or syncope. No bleeding. She is steadily feeling better from a flu standpoint, still taking Tamiflu. She has questions about her blood thinner plan as she was not sure what to do with the Plavix (has not been taking recently).   Labwork independently reviewed: 12/2020 CBC wnl, K 3.8, Cr 0.73, LFTs wnl 11/2020 LDL 119, Mg 1.8, A1C 9, TSH wnl   Past Medical History:  Diagnosis Date   Arthritis    Asthma    Coronary artery disease    Diabetes mellitus    Diabetic neuropathy (HCC)    GERD (gastroesophageal reflux disease)    History of hiatal hernia    Hx of cardiovascular stress test    Lexiscan Myoview (10/15):  Medium area of ischemia in AL and IL distribution, cannot completely exclude shifting breast attenuation, EF 64%; Abnormal study   Hyperlipidemia    Hypertension    Mild carotid artery disease (HCC)    Morbid obesity (HCC)    PAF (paroxysmal atrial fibrillation) ([place])    Sleep apnea    does not wear CPAP   Stroke (cerebrum) ([place])    Syncope     Past Surgical History:  Procedure Laterality Date   ACHILLES TENDON REPAIR  1995   right   COLONOSCOPY     DILATION AND CURETTAGE OF UTERUS     HAND SURGERY     right   heart stent  2007  PARS PLANA VITRECTOMY Left 03/24/2017   Procedure: PARS PLANA VITRECTOMY WITH 25 GAUGE;  Surgeon: Hurman Horn, MD;  Location: [facility];  Service: Ophthalmology;  Laterality: Left;   UTERINE FIBROID SURGERY  2008    Current Medications: Current Meds  Medication Sig   acetaminophen (TYLENOL) 325 MG tablet Take 650 mg by mouth as needed.   albuterol (PROVENTIL HFA;VENTOLIN HFA) 108 (90 Base) MCG/ACT inhaler Inhale 1-2 puffs into the lungs every 6 (six) hours as needed for wheezing or shortness of breath.   ALPRAZolam (XANAX) 0.5 MG tablet Take 0.5 mg by mouth 2 (two) times daily as needed.    amLODipine (NORVASC) 10 MG tablet Take 1 tablet by mouth once daily   apixaban (ELIQUIS) 5 MG TABS tablet Take 1 tablet (5 mg total) by mouth 2 (two) times daily.   aspirin EC 81 MG tablet Take 1 tablet (81 mg total) by mouth daily.   benzonatate (TESSALON) 100 MG capsule Take 1 capsule (100 mg total) by mouth 3 (three) times daily as needed for cough.   BREO ELLIPTA 100-25 MCG/INH AEPB Inhale 1 puff into the lungs daily.   carvedilol (COREG) 6.25 MG tablet Take 1 tablet by mouth twice daily   clopidogrel (PLAVIX) 75 MG tablet Take 75 mg by mouth daily.   escitalopram (LEXAPRO) 5 MG tablet Take 5 mg by mouth daily.   FREESTYLE LITE test strip 1 each by Other route as directed.    furosemide (LASIX) 40 MG tablet Take 2 tablets by mouth once daily   HUMULIN R 500 UNIT/ML SOLN injection Inject 80-110 Units into the skin 2 (two) times daily with a meal. 80 units in the morning and 40 every evening. Sliding Scale.   lisinopril (ZESTRIL) 40 MG tablet Take 1 tablet by mouth once daily   nitroGLYCERIN (NITROSTAT) 0.4 MG SL tablet Place 1 tablet (0.4 mg total) under the tongue every 5 (five) minutes as needed for chest pain (X 3 DOSES FOR CHEST PAIN).   ondansetron (ZOFRAN-ODT) 8 MG disintegrating tablet Take 1 tablet (8 mg total) by mouth every 8 (eight) hours as needed for nausea or vomiting.   oseltamivir (TAMIFLU) 75 MG capsule Take 75 mg by mouth.   pantoprazole (PROTONIX) 40 MG tablet Take 40 mg by mouth daily.   Potassium Chloride (KLOR-CON 10 PO) Take 10 mEq by mouth in the morning and at bedtime. Per patient taking 1 tablet twice daily   rosuvastatin (CRESTOR) 40 MG tablet Take 1 tablet by mouth once daily   TRULICITY 1.5 HF/0.2OV SOPN Inject 1.5 mg into the skin every 7 (seven) days.     Allergies:   Bystolic [nebivolol hcl], Crestor [rosuvastatin], Sulfa antibiotics, Metformin and related, and Other   Social History   Socioeconomic History   Marital status: Single    Spouse name: Not  on file   Number of children: Not on file   Years of education: Not on file   Highest education level: Not on file  Occupational History   Not on file  Tobacco Use   Smoking status: Former    Types: Cigarettes    Quit date: 08/07/1982    Years since quitting: 38.4   Smokeless tobacco: Never  Vaping Use   Vaping Use: Never used  Substance and Sexual Activity   Alcohol use: No   Drug use: No   Sexual activity: Not on file  Other Topics Concern   Not on file  Social History Narrative   Not on  file   Social Determinants of Health   Financial Resource Strain: Not on file  Food Insecurity: Not on file  Transportation Needs: Not on file  Physical Activity: Not on file  Stress: Not on file  Social Connections: Not on file     Family History:  The patient's family history includes Asthma in an other family member; Diabetes in an other family member; Heart attack in her father; Hypertension in an other family member; Kidney disease in her father; Stroke in her father, maternal grandfather, and paternal grandfather.  ROS:   Please see the history of present illness.  All other systems are reviewed and otherwise negative.    EKGs/Labs/Other Studies Reviewed:    Studies reviewed are outlined and summarized above. Reports included below if pertinent.  2D echo 11/2020 IMPRESSIONS     1. Left ventricular ejection fraction, by estimation, is 60 to 65%. The  left ventricle has normal function. The left ventricle has no regional  wall motion abnormalities. There is moderate concentric left ventricular  hypertrophy. Left ventricular  diastolic parameters are consistent with Grade I diastolic dysfunction  (impaired relaxation). Elevated left ventricular end-diastolic pressure.   2. RV-RA gradient normal at 27 mmHg suggesting normal RVSP. Right  ventricular systolic function is normal. The right ventricular size is  normal.   3. The mitral valve is grossly normal. Trivial mitral  valve  regurgitation.   4. The aortic valve is tricuspid. Aortic valve regurgitation is not  visualized. Aortic valve mean gradient measures 5.0 mmHg.   5. Unable to estimate CVP.   Comparison(s): No prior Echocardiogram.   Carotid US 11/2020 IMPRESSION: Mild bilateral carotid atherosclerosis. Negative for stenosis. Degree of narrowing less than 50% bilaterally by ultrasound criteria.   Patent antegrade vertebral flow bilaterally     Electronically Signed   By: Jerilynn Mages.  Shick M.D.   On: 11/28/2020 11:11    Cath 06/2010 DATE OF PROCEDURE:  06/26/2010 DATE OF DISCHARGE:  06/26/2010                            CARDIAC CATHETERIZATION     PRIMARY CARE PHYSICIAN:  Judithann Sauger, MD   PROCEDURES PERFORMED: 1. Left heart catheterization. 2. Left ventriculogram. 3. Coronary angiogram. 4. Abdominal aortogram.   OPERATOR:  Jettie Booze, MD   INDICATIONS:  Prior coronary artery disease, worsening shortness of breath which was her anginal equivalent in the past.   PROCEDURE NARRATIVE:  The risks and benefits of cardiac cath were explained to the patient.  Informed consent was obtained.  She was brought to the cath lab.  She was prepped and draped in usual sterile fashion.  Her right wrist was infiltrated with 1% lidocaine.  A 5-French glide sheath was placed in to the right radial artery using modified Seldinger technique.  Right coronary artery angiography was performed using a JR-4.0 catheter.  The catheter was advanced to the vessel ostium under fluoroscopic guidance.  Digital angiography was performed in multiple projections using hand injection of contrast.  It was difficult to engage the RCA with this catheter and a No-Torque Right may work better if necessary in the future. The left main coronary artery is widely patent. The left circumflex is a large vessel.  There is an OM-1 and OM-2 which are medium-sized, both of which are widely patent. The left anterior  descending is a large vessel.  The stent in the mid vessel appears widely patent.  The first and second diagonals are also widely patent.  There is minimal disease in the mid-to-distal LAD. The left ventriculogram shows normal ventricular function with an ejection fraction of 60%.   HEMODYNAMICS:  LV pressure 150/0 with an LVEDP of 15 mmHg.  Aortic pressure 148/86 with mean aortic pressure of 112 mmHg. Abdominal aortogram was performed which showed no abdominal aortic aneurysm.  There is no evidence of renal artery stenosis.  The sheath was removed and a TR band was placed for hemostasis.  Heparin was used during the procedure.   IMPRESSION: 1. Patent left anterior descending coronary artery stent.  No     significant coronary artery disease. 2. Normal left ventricular function. 3. No abdominal aortic aneurysm or evidence of renal artery stenosis.   RECOMMENDATIONS:  Continue medical therapy and attempts at weight loss. She will be watched for a few hours and then sent home later today.         Jettie Booze, MD         JSV/MEDQ  D:  06/26/2010  T:  06/26/2010  Job:  [id number]   Electronically Signed by Larae Grooms MD on 06/27/2010 08:21:14 AM        Last signed by: Jettie Booze., MD at 06/26/2010 10:31 PM     NST 2015 Per notes: "Reviewed with Dr. Casandra Doffing. There are defects that may be related to ischemia vs shifting breast tissue.  EF is normal. Findings are fairly equivocal."    EKG:  EKG is not ordered today but reviewed from 12/20/20, NSR, somewhat low voltage, no acute STT changes.  Recent Labs: 03/11/2020: NT-Pro BNP 28 11/27/2020: TSH 3.797 11/29/2020: Magnesium 1.8 12/20/2020: ALT 16; BUN 9; Creatinine, Ser 0.73; Hemoglobin 14.1; Platelets 317; Potassium 3.8; Sodium 138  Recent Lipid Panel    Component Value Date/Time   CHOL 169 11/29/2020 0447   CHOL 129 06/01/2019 0810   TRIG 83 11/29/2020 0447   HDL 33 (L) 11/29/2020 0447    HDL 38 (L) 06/01/2019 0810   CHOLHDL 5.1 11/29/2020 0447   VLDL 17 11/29/2020 0447   LDLCALC 119 (H) 11/29/2020 0447   LDLCALC 78 06/01/2019 0810    PHYSICAL EXAM:    VS:  BP 120/82   Pulse 67   Ht 5' 2.5" (1.588 m)   Wt 280 lb 12.8 oz (127.4 kg)   LMP  (LMP Unknown)   SpO2 94%   BMI 50.54 kg/m   BMI: Body mass index is 50.54 kg/m.  GEN: Well nourished, well developed female in no acute distress HEENT: normocephalic, atraumatic Neck: no JVD, carotid bruits, or masses Cardiac: RRR; no murmurs, rubs, or gallops, no edema  Respiratory:  clear to auscultation bilaterally, normal work of breathing. Intermittent coughing. GI: soft, nontender, nondistended, + BS MS: no deformity or atrophy Skin: warm and dry, no rash Neuro:  Alert and Oriented x 3, Strength and sensation are intact, follows commands Psych: euthymic mood, full affect  Wt Readings from Last 3 Encounters:  01/01/21 280 lb 12.8 oz (127.4 kg)  12/30/20 283 lb (128.4 kg)  12/20/20 287 lb 11.2 oz (130.5 kg)     ASSESSMENT & PLAN:   1. Paroxysmal atrial fibrillation in the context of recent stroke - clinically in NSR on exam today. She has completed the monitor and mailed it back in, results pending. She was asymptomatic at the time of her arrhythmia so suspect this was a provoking factor in her stroke. She is also following with neurology.  Per recommendations of our cardiac team in the hospital, continue Eliquis and ASA WITHOUT clopidogrel/Plavix. I reiterated these instructions to her today. Also discussed observation for bleeding. F/u CBC post-initiation of Eliquis was normal on 11/11. She has not tolerated CPAP so may be at risk for recurrent atrial arrhythmias given h/o OSA.   2. CAD with HLD goal LDL <70 - no recent angina. Plavix discontinued as above, continued on ASA per hospital cardiology team. Bleeding precautions reviewed. Continue beta blocker. LDL not well controlled despite compliance confirmed with  rosuvastatin. Add ezetimibe 10mg  daily and recheck fasting lipid profile/LFTs in 6-8 weeks. Also discussed lifestyle modification, impact of weight.  3. Syncope - etiology unclear, not on monitor at time of this occurrence in ED, await monitor results. If this were to recur, would consider loop recorder. Advised DMV recommendation of no driving x 6 months. Has also seen neurology with EEG ordered.  4. Essential HTN - BP controlled, no changes today.  5. Mild carotid artery disease - titrate antilipid therapy as above. Follow clinically and consider repeat duplex in 2-3 years.     Disposition: F/u with Dr. Irish Lack or myself in 3 months.   Medication Adjustments/Labs and Tests Ordered: Current medicines are reviewed at length with the patient today.  Concerns regarding medicines are outlined above. Medication changes, Labs and Tests ordered today are summarized above and listed in the Patient Instructions accessible in Encounters.   Signed, [name], PA-C  01/01/2021 3:42 PM    [facility] [address], [place], [place]  [address] Phone: [phone]; Fax: [phone]

## 2020-12-31 NOTE — Procedures (Signed)
    History:  87 yof with recently diagnosed occipital stroke and 1 episode of syncope  EEG classification: Awake and drowsy  Description of the recording: The background rhythms of this recording consists of a fairly well modulated medium amplitude alpha rhythm of 8.5-9 Hz that is reactive to eye opening and closure. As the record progresses, the patient appears to remain in the waking state throughout the recording. Photic stimulation was performed, did not show any abnormalities. Hyperventilation was not performed. Toward the end of the recording, the patient enters the drowsy state with slight symmetric slowing seen. The patient never enters stage II sleep. No abnormal epileptiform discharges seen during this recording. There was intermittent left posterior quadrant focal slowing. EKG monitor shows no evidence of cardiac rhythm abnormalities with a heart rate of 78.  Impression: This is an  abnormal EEG recording in the waking and drowsy state due to intermittent left posterior quadrant slowing. Left intermittent slowing is consistent with an area of neuronal dysfunction within the left posterior quadrant. No evidence of interictal epileptiform discharges seen, no seizures seen.    Alric Ran, MD [facility]

## 2021-01-01 ENCOUNTER — Encounter: Payer: Self-pay | Admitting: Physician Assistant

## 2021-01-01 ENCOUNTER — Telehealth: Payer: Self-pay | Admitting: Neurology

## 2021-01-01 ENCOUNTER — Other Ambulatory Visit: Payer: Self-pay

## 2021-01-01 ENCOUNTER — Ambulatory Visit: Payer: 59 | Admitting: Physician Assistant

## 2021-01-01 VITALS — BP 120/82 | HR 67 | Ht 62.5 in | Wt 280.8 lb

## 2021-01-01 DIAGNOSIS — I2510 Atherosclerotic heart disease of native coronary artery without angina pectoris: Secondary | ICD-10-CM

## 2021-01-01 DIAGNOSIS — R55 Syncope and collapse: Secondary | ICD-10-CM

## 2021-01-01 DIAGNOSIS — E785 Hyperlipidemia, unspecified: Secondary | ICD-10-CM | POA: Diagnosis not present

## 2021-01-01 DIAGNOSIS — I779 Disorder of arteries and arterioles, unspecified: Secondary | ICD-10-CM

## 2021-01-01 DIAGNOSIS — I10 Essential (primary) hypertension: Secondary | ICD-10-CM

## 2021-01-01 DIAGNOSIS — I480 Paroxysmal atrial fibrillation: Secondary | ICD-10-CM | POA: Diagnosis not present

## 2021-01-01 MED ORDER — EZETIMIBE 10 MG PO TABS: 10.0000 mg | ORAL_TABLET | Freq: Every day | ORAL | 3 refills | Status: DC

## 2021-01-01 NOTE — Telephone Encounter (Signed)
11/23 at 1147:Left a voice message for patient regarding recent EEG results.   Alric Ran, MD

## 2021-01-01 NOTE — Telephone Encounter (Signed)
Pt called, know you have spoken with daughter. Would like a call from the nurse go over EEG results.

## 2021-01-01 NOTE — Telephone Encounter (Signed)
Spoke with patient, informed her of the EEG showing left posterior quadrant slowing, all questions answered.   Alric Ran, MD

## 2021-01-01 NOTE — Patient Instructions (Addendum)
Medication Instructions:  Your physician has recommended you make the following change in your medication:   STOP Plavix  START Zetia 10 mg taking 1 daily    *If you need a refill on your cardiac medications before your next appointment, please call your pharmacy*   Lab Work: 02/26/2021 COME TO THE LAB FOR FASTING:  LIPID / LFT (ANYTIME AFTER 7:30 A.M. BEFORE 3:30 P.M., NOTHING TO EAT OR DRINK AFTER MIDNIGHT THE NIGHT BEFORE   If you have labs (blood work) drawn today and your tests are completely normal, you will receive your results only by: [place] (if you have MyChart) OR A paper copy in the mail If you have any lab test that is abnormal or we need to change your treatment, we will call you to review the results.   Testing/Procedures: None ordered   Follow-Up: At [facility], you and your health needs are our priority.  As part of our continuing mission to provide you with exceptional heart care, we have created designated Provider Care Teams.  These Care Teams include your primary Cardiologist (physician) and Advanced Practice Providers (APPs -  Physician Assistants and Nurse Practitioners) who all work together to provide you with the care you need, when you need it.  We recommend signing up for the patient portal called "MyChart".  Sign up information is provided on this After Visit Summary.  MyChart is used to connect with patients for Virtual Visits (Telemedicine).  Patients are able to view lab/test results, encounter notes, upcoming appointments, etc.  Non-urgent messages can be sent to your provider as well.   To learn more about what you can do with MyChart, go to [email or web address].    Your next appointment:   3 month(s)  04/03/2021 ARRIVE AT 11:25  The format for your next appointment:   In Person  Provider:   Larae Grooms, MD  or Melina Copa, PA-C         Other Instructions

## 2021-01-01 NOTE — Progress Notes (Signed)
Spoke with patient, discuss EEG results showing left posterior quadrant slowing.   Alric Ran, MD

## 2021-01-05 ENCOUNTER — Other Ambulatory Visit: Payer: Self-pay

## 2021-01-05 ENCOUNTER — Ambulatory Visit
Admission: RE | Admit: 2021-01-05 | Discharge: 2021-01-05 | Disposition: A | Discharge status: Home / Self Care | Payer: 59 | Source: Ambulatory Visit | Attending: Family Medicine | Admitting: Family Medicine

## 2021-01-05 VITALS — BP 152/84 | HR 76 | Temp 98.2°F | Resp 18

## 2021-01-05 DIAGNOSIS — R0981 Nasal congestion: Secondary | ICD-10-CM | POA: Diagnosis present

## 2021-01-05 DIAGNOSIS — R300 Dysuria: Secondary | ICD-10-CM | POA: Diagnosis not present

## 2021-01-05 LAB — POCT URINALYSIS DIP (MANUAL ENTRY)
Glucose, UA: NEGATIVE mg/dL
Ketones, POC UA: NEGATIVE mg/dL
Nitrite, UA: NEGATIVE
Protein Ur, POC: 30 mg/dL — AB
Spec Grav, UA: >=1.03 — AB (ref 1.010–1.025)
Urobilinogen, UA: 1 E.U./dL
pH, UA: 5.5 (ref 5.0–8.0)

## 2021-01-05 NOTE — ED Triage Notes (Signed)
Recent UTI, wants to make sure UTI has cleared up.  Last dose of antibiotic was over a week ago.

## 2021-01-05 NOTE — Discharge Instructions (Signed)
I am culturing your urine to ensure that your infection has completely resolved now that she completed your antibiotics.  For your nasal congestion symptoms start Mucinex take as directed and drink plenty of water while taking medication to ensure that it works effectively.

## 2021-01-05 NOTE — ED Provider Notes (Signed)
[facility]    CSN: [id number] Arrival date & time: 01/05/21  1348      History   Chief Complaint No chief complaint on file.   HPI [name] is a 65 y.o. female.   HPI Patient presents today for a urine recheck.  Patient was seen in the ER on November 11 diagnosed with a urinary tract infection.  However on review of EMR urine culture did not grow out any bacteria although she was treated with Keflex.  Reports finishing antibiotic 5 days ago and wanted to have her urine rechecked.  Not currently having any urinary tract and symptoms although reports that she just generally does not feel well.  She subsequently was diagnosed with influenza A on November 19.  Treated with Tamiflu along with antitussive medication.  She continues to have some residual nasal congestion.  She is afebrile.  Denies any worrisome symptoms of shortness of breath or wheezing.  On 1119 she did have a negative chest x-ray.  Past Medical History:  Diagnosis Date   Arthritis    Asthma    Coronary artery disease    Diabetes mellitus    Diabetic neuropathy (HCC)    GERD (gastroesophageal reflux disease)    History of hiatal hernia    Hx of cardiovascular stress test    Lexiscan Myoview (10/15):  Medium area of ischemia in AL and IL distribution, cannot completely exclude shifting breast attenuation, EF 64%; Abnormal study   Hyperlipidemia    Hypertension    Mild carotid artery disease (HCC)    Morbid obesity (HCC)    PAF (paroxysmal atrial fibrillation) ([place])    Sleep apnea    does not wear CPAP   Stroke (cerebrum) ([place])    Syncope     Patient Active Problem List   Diagnosis Date Noted   Paroxysmal atrial fibrillation with RVR ([place]) 11/28/2020   Hypertensive urgency 11/28/2020   Syncope and collapse 11/28/2020   Nausea 11/28/2020   Hypokalemia 11/28/2020   Hyperglycemia due to diabetes mellitus ([place]) 11/28/2020   GERD (gastroesophageal reflux disease) 11/28/2020   Asthma  11/28/2020   Coronary atherosclerosis of native coronary artery 07/17/2013   Mixed hyperlipidemia 07/17/2013   Essential hypertension, benign 07/17/2013   Obesity 08/11/2011    Past Surgical History:  Procedure Laterality Date   [place]   right   COLONOSCOPY     DILATION AND CURETTAGE OF UTERUS     HAND SURGERY     right   heart stent  2007   PARS PLANA VITRECTOMY Left 03/24/2017   Procedure: PARS PLANA VITRECTOMY WITH 25 GAUGE;  Surgeon: Hurman Horn, MD;  Location: [facility];  Service: Ophthalmology;  Laterality: Left;   UTERINE FIBROID SURGERY  2008    OB History   No obstetric history on file.      Home Medications    Prior to Admission medications   Medication Sig Start Date End Date Taking? Authorizing Provider  acetaminophen (TYLENOL) 325 MG tablet Take 650 mg by mouth as needed.    [provider]  albuterol (PROVENTIL HFA;VENTOLIN HFA) 108 (90 Base) MCG/ACT inhaler Inhale 1-2 puffs into the lungs every 6 (six) hours as needed for wheezing or shortness of breath. 05/24/17   Long, Wonda Olds, MD  ALPRAZolam Duanne Moron) 0.5 MG tablet Take 0.5 mg by mouth 2 (two) times daily as needed. 11/21/20   [provider]  amLODipine (NORVASC) 10 MG tablet Take 1 tablet by  mouth once daily 03/04/20   Jettie Booze, MD  apixaban (ELIQUIS) 5 MG TABS tablet Take 1 tablet (5 mg total) by mouth 2 (two) times daily. 11/29/20 02/27/21  Barb Merino, MD  aspirin EC 81 MG tablet Take 1 tablet (81 mg total) by mouth daily. 03/03/19   Jettie Booze, MD  benzonatate (TESSALON) 100 MG capsule Take 1 capsule (100 mg total) by mouth 3 (three) times daily as needed for cough. 12/28/20   Ward, Janett Billow Z, PA-C  BREO ELLIPTA 100-25 MCG/INH AEPB Inhale 1 puff into the lungs daily. 09/21/16   [provider]  carvedilol (COREG) 6.25 MG tablet Take 1 tablet by mouth twice daily 12/19/20   Jettie Booze, MD  clopidogrel (PLAVIX) 75 MG tablet Take  75 mg by mouth daily. 12/19/20   [provider]  escitalopram (LEXAPRO) 5 MG tablet Take 5 mg by mouth daily. 11/22/20   [provider]  ezetimibe (ZETIA) 10 MG tablet Take 1 tablet (10 mg total) by mouth daily. 01/01/21 04/01/21  Dunn, Nedra Hai, PA-C  FREESTYLE LITE test strip 1 each by Other route as directed.  07/08/11   [provider]  furosemide (LASIX) 40 MG tablet Take 2 tablets by mouth once daily 12/19/20   Jettie Booze, MD  HUMULIN R 500 UNIT/ML SOLN injection Inject 80-110 Units into the skin 2 (two) times daily with a meal. 80 units in the morning and 40 every evening. Sliding Scale. 12/25/13   [provider]  lisinopril (ZESTRIL) 40 MG tablet Take 1 tablet by mouth once daily 03/04/20   Jettie Booze, MD  nitroGLYCERIN (NITROSTAT) 0.4 MG SL tablet Place 1 tablet (0.4 mg total) under the tongue every 5 (five) minutes as needed for chest pain (X 3 DOSES FOR CHEST PAIN). 11/30/17   Jettie Booze, MD  ondansetron (ZOFRAN-ODT) 8 MG disintegrating tablet Take 1 tablet (8 mg total) by mouth every 8 (eight) hours as needed for nausea or vomiting. 05/12/20   Scot Jun, FNP  oseltamivir (TAMIFLU) 75 MG capsule Take 75 mg by mouth in the morning and at bedtime.    [provider]  pantoprazole (PROTONIX) 40 MG tablet Take 40 mg by mouth daily.    [provider]  Potassium Chloride (KLOR-CON 10 PO) Take 10 mEq by mouth in the morning and at bedtime. Per patient taking 1 tablet twice daily    [provider]  rosuvastatin (CRESTOR) 40 MG tablet Take 1 tablet by mouth once daily 03/04/20   Jettie Booze, MD  TRULICITY 1.5 OZ/3.6UY SOPN Inject 1.5 mg into the skin every 7 (seven) days. 09/30/20   [provider]    Family History Family History  Problem Relation Age of Onset   Kidney disease Father    Stroke Father    Heart attack Father    Stroke Maternal Grandfather    Stroke Paternal  Grandfather    Asthma Other    Hypertension Other    Diabetes Other     Social History Social History   Tobacco Use   Smoking status: Former    Types: Cigarettes    Quit date: 08/07/1982    Years since quitting: 38.4   Smokeless tobacco: Never  Vaping Use   Vaping Use: Never used  Substance Use Topics   Alcohol use: No   Drug use: No     Allergies   Bystolic [nebivolol hcl], Crestor [rosuvastatin], Sulfa antibiotics, Metformin and related, and  Other   Review of Systems Review of Systems Pertinent negatives listed in HPI  Physical Exam Triage Vital Signs ED Triage Vitals  Enc Vitals Group     BP 01/05/21 1411 (!) 152/84     Pulse Rate 01/05/21 1411 76     Resp 01/05/21 1411 18     Temp 01/05/21 1411 98.2 F (36.8 C)     Temp Source 01/05/21 1411 Oral     SpO2 01/05/21 1411 93 %     Weight --      Height --      Head Circumference --      Peak Flow --      Pain Score 01/05/21 1414 0     Pain Loc --      Pain Edu? --      Excl. in [place]? --    No data found.  Updated Vital Signs BP (!) 152/84 (BP Location: Right Arm)   Pulse 76   Temp 98.2 F (36.8 C) (Oral)   Resp 18   LMP  (LMP Unknown)   SpO2 93%   Visual Acuity Right Eye Distance:   Left Eye Distance:   Bilateral Distance:    Right Eye Near:   Left Eye Near:    Bilateral Near:     Physical Exam  General Appearance:    Alert, cooperative, no distress  HENT:   Normocephalic, ears normal, nares mucosal edema with congestion, rhinorrhea, oropharynx  clear   Eyes:    PERRL, conjunctiva/corneas clear, EOM's intact       Lungs:     Clear to auscultation bilaterally, respirations unlabored  Heart:    Regular rate and rhythm  Neurologic:   Awake, alert, oriented x 3. No apparent focal neurological           defect.      UC Treatments / Results  Labs (all labs ordered are listed, but only abnormal results are displayed) Labs Reviewed  POCT URINALYSIS DIP (MANUAL ENTRY) - Abnormal; Notable for  the following components:      Result Value   Bilirubin, UA small (*)    Spec Grav, UA >=1.030 (*)    Blood, UA small (*)    Protein Ur, POC =30 (*)    Leukocytes, UA Trace (*)    All other components within normal limits  URINE CULTURE    EKG   Radiology No results found.  Procedures Procedures (including critical care time)  Medications Ordered in UC Medications - No data to display  Initial Impression / Assessment and Plan / UC Course  I have reviewed the triage vital signs and the nursing notes.  Pertinent labs & imaging results that were available during my care of the patient were reviewed by me and considered in my medical decision making (see chart for details).    UA shows a trace of leukocytes along with protein.  However given patient's recently treated with a round of antibiotics and previous urine culture did not yield any findings consistent with UTI will culture urine before treating patient.  She is also asymptomatic.  For nasal symptoms start Mucinex along with hydrating well with fluids.  Return as needed. Final Clinical Impressions(s) / UC Diagnoses   Final diagnoses:  Dysuria  Nasal congestion     Discharge Instructions      I am culturing your urine to ensure that your infection has completely resolved now that she completed your antibiotics.  For your nasal congestion symptoms start  Mucinex take as directed and drink plenty of water while taking medication to ensure that it works effectively.      ED Prescriptions   None    PDMP not reviewed this encounter.   Scot Jun, [place] 01/05/21 [phone]

## 2021-01-06 LAB — URINE CULTURE
Culture: <10000 — AB
Special Requests: NORMAL

## 2021-01-07 ENCOUNTER — Other Ambulatory Visit: Payer: Self-pay

## 2021-01-07 ENCOUNTER — Ambulatory Visit (HOSPITAL_COMMUNITY): Payer: 59 | Attending: Neurology

## 2021-01-07 DIAGNOSIS — I6389 Other cerebral infarction: Secondary | ICD-10-CM | POA: Diagnosis not present

## 2021-01-07 DIAGNOSIS — R2689 Other abnormalities of gait and mobility: Secondary | ICD-10-CM | POA: Insufficient documentation

## 2021-01-07 DIAGNOSIS — R2681 Unsteadiness on feet: Secondary | ICD-10-CM | POA: Insufficient documentation

## 2021-01-07 NOTE — Therapy (Signed)
[place], [place], [address] Phone: [phone]   Fax:  [phone]  Physical Therapy Evaluation  Patient Details  Name: [name] MRN: [id number] Date of Birth: 01/06/1956 Referring Provider (PT): Alric Ran   Encounter Date: 01/07/2021   PT End of Session - 01/07/21 0815     Visit Number 1    Number of Visits 1    Authorization Type [place]    PT Start Time [phone]    PT Stop Time 0900    PT Time Calculation (min) 45 min    Activity Tolerance Patient tolerated treatment well    Behavior During Therapy [facility] for tasks assessed/performed             Past Medical History:  Diagnosis Date   Arthritis    Asthma    Coronary artery disease    Diabetes mellitus    Diabetic neuropathy ([place])    GERD (gastroesophageal reflux disease)    History of hiatal hernia    Hx of cardiovascular stress test    Lexiscan Myoview (10/15):  Medium area of ischemia in AL and IL distribution, cannot completely exclude shifting breast attenuation, EF 64%; Abnormal study   Hyperlipidemia    Hypertension    Mild carotid artery disease (HCC)    Morbid obesity (HCC)    PAF (paroxysmal atrial fibrillation) (HCC)    Sleep apnea    does not wear CPAP   Stroke (cerebrum) ([place])    Syncope     Past Surgical History:  Procedure Laterality Date   [place]   right   COLONOSCOPY     DILATION AND CURETTAGE OF UTERUS     HAND SURGERY     right   heart stent  2007   PARS PLANA VITRECTOMY Left 03/24/2017   Procedure: PARS PLANA VITRECTOMY WITH 25 GAUGE;  Surgeon: Hurman Horn, MD;  Location: [facility];  Service: Ophthalmology;  Laterality: Left;   UTERINE FIBROID SURGERY  2008    There were no vitals filed for this visit.    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 Pt denies any significant balance or mobility  deficits and notes her main issue is memory and some instances of confusion, anxiety, and more executive function issues noted than her gait. Pt has assistance from family members for some home upkeep.    Pertinent History DM, A-fib, HTN    Currently in Pain? No/denies    Pain Score 0-No pain                [facility] PT Assessment - 01/07/21 0001       Assessment   Medical Diagnosis Cerebrovascular accident (CVA)    Referring Provider (PT) Camara, Amadou      Balance Screen   Has the patient fallen in the past 6 months No    Has the patient had a decrease in activity level because of a fear of falling?  No    Is the patient reluctant to leave their home because of a fear of falling?  No      Home Environment   Living Environment Private residence    Living Arrangements Alone    Available Help at Discharge Family;Available PRN/intermittently    Type of [place] to live on  main level with bedroom/bathroom;One level    Home Equipment None      Prior Function   Level of Independence Independent    Vocation Full time employment;On disability    Vocation Requirements Dept Social Services, computer work primarily      [employer] Restless;Poor frustration tolerance      Observation/Other Assessments   Cranial Nerve(s) grossly intact 2-12      Sensation   Light Touch Appears Intact      Coordination   Gross Motor Movements are Fluid and Coordinated Yes    Fine Motor Movements are Fluid and Coordinated Yes    Heel Shin Test normal      Functional Tests   Functional tests Single leg stance;Sit to Stand      Single Leg Stance   Comments 5-10 sec RLE, 3-5 sec LLE (hx of Achilles tear/repair)      Sit to Stand   Comments Independent      Posture/Postural Control   Posture/Postural Control No significant limitations      ROM / Strength   AROM / PROM / Strength Strength      Strength   Overall Strength  Within functional limits for tasks performed      Transfers   Transfers Sit to Stand;Stand to Sit    Sit to Stand 7: Independent      Ambulation/Gait   Ambulation/Gait Yes    Ambulation/Gait Assistance 7: Independent    Assistive device None    Ambulation Surface Level;Indoor    Stairs Yes    Stairs Assistance 6: Modified independent (Device/Increase time)    Stair Management Technique Two rails;Alternating pattern    Number of Stairs 12      Standardized Balance Assessment   Standardized Balance Assessment Dynamic Gait Index      Dynamic Gait Index   Level Surface Normal    Change in Gait Speed Mild Impairment    Gait with Horizontal Head Turns Mild Impairment    Gait with Vertical Head Turns Normal    Gait and Pivot Turn Normal    Step Over Obstacle Normal    Step Around Obstacles Normal    Steps Mild Impairment    Total Score 21                        Objective measurements completed on examination: See above findings.                PT Education - 01/07/21 [phone]     Education Details pt educated on benefits of regular, consistent physical activity/exercise to improve overall health condition/status    Person(s) Educated Patient    Methods Explanation    Comprehension Verbalized understanding                         Plan - 01/07/21 0844     Clinical Impression Statement 65 yo lady with recent hx of CVA affecting occpital lobe.  Presents little to no functional mobility/ADL deficits with low risk for falls per Dynamic Gait Index assessment.  Patient able to perform all mobility and ambulation at an independent level consistent with her baseline.  Pt notes some difficulty in walking longer distances but attributes this to her hx of Achilles repair and this bothered her pre-CVA.  No skilled PT services indicated at this time and discussed findings with patient and encouraged to increase physical activity. No abnormal response in  vitals signs with exertion during assessment 126/71 mmHg, 77 bpm    Personal Factors and Comorbidities Time since onset of injury/illness/exacerbation;Comorbidity 3+    Comorbidities DM, HTN, A-fib    Stability/Clinical Decision Making Stable/Uncomplicated    Clinical Decision Making Low    Rehab Potential Good    PT Frequency One time visit    Consulted and Agree with Plan of Care Patient             Patient will benefit from skilled therapeutic intervention in order to improve the following deficits and impairments:  Other (comment), Obesity (No deficits noted)  Visit Diagnosis: Unsteadiness on feet  Other abnormalities of gait and mobility     Problem List Patient Active Problem List   Diagnosis Date Noted   Paroxysmal atrial fibrillation with RVR ([place]) 11/28/2020   Hypertensive urgency 11/28/2020   Syncope and collapse 11/28/2020   Nausea 11/28/2020   Hypokalemia 11/28/2020   Hyperglycemia due to diabetes mellitus ([place]) 11/28/2020   GERD (gastroesophageal reflux disease) 11/28/2020   Asthma 11/28/2020   Coronary atherosclerosis of native coronary artery 07/17/2013   Mixed hyperlipidemia 07/17/2013   Essential hypertension, benign 07/17/2013   Obesity 08/11/2011    Toniann Fail, PT 01/07/2021, 8:50 AM  [facility] [address] [place], [place], [address] Phone: [phone]   Fax:  [phone]  Name: [name] MRN: [id number] Date of Birth: 1955/03/31

## 2021-01-16 ENCOUNTER — Telehealth: Payer: Self-pay | Admitting: Neurology

## 2021-01-16 NOTE — Telephone Encounter (Signed)
Pt's niece, Elliot Gault (on [place]) called, my aunt could not remember if discussed needing to schedule another appt after going over to the EEG results. Would like a call from the nurse

## 2021-01-16 NOTE — Telephone Encounter (Signed)
Attempted to call, LVM that pt will fu in 1 year, appt already made

## 2021-01-17 ENCOUNTER — Telehealth: Payer: Self-pay | Admitting: Interventional Cardiology

## 2021-01-17 NOTE — Telephone Encounter (Signed)
I spoke with patient and told her she should be taking Rosuvastatin

## 2021-01-17 NOTE — Telephone Encounter (Signed)
   Pt c/o medication issue:  1. Name of Medication:   rosuvastatin (CRESTOR) 40 MG tablet   2. How are you currently taking this medication (dosage and times per day)? Take 1 tablet by mouth once daily  3. Are you having a reaction (difficulty breathing--STAT)?   4. What is your medication issue? Pt is calling, would like to know if she does need to be taking this med

## 2021-01-20 ENCOUNTER — Other Ambulatory Visit: Payer: Self-pay

## 2021-01-20 MED ORDER — LISINOPRIL 40 MG PO TABS: 40.0000 mg | ORAL_TABLET | Freq: Every day | ORAL | 3 refills | Status: DC

## 2021-01-20 MED ORDER — AMLODIPINE BESYLATE 10 MG PO TABS: 10.0000 mg | ORAL_TABLET | Freq: Every day | ORAL | 3 refills | Status: DC

## 2021-02-12 NOTE — Telephone Encounter (Addendum)
The patient was referred back to our office for her abnormal EEG (from PCP). I spoke to Dr. April Manson further about the results. He confirmed the EEG showed consistent findings with her history of left occipital stroke. No need for further work-up. Okay to keep the yearly follow up that is already scheduled.

## 2021-02-15 ENCOUNTER — Other Ambulatory Visit: Payer: Self-pay | Admitting: Interventional Cardiology

## 2021-02-20 ENCOUNTER — Telehealth: Payer: Self-pay | Admitting: Interventional Cardiology

## 2021-02-20 MED ORDER — APIXABAN 5 MG PO TABS: 5.0000 mg | ORAL_TABLET | Freq: Two times a day (BID) | ORAL | 1 refills | Status: DC

## 2021-02-20 NOTE — Telephone Encounter (Signed)
*  STAT* If patient is at the pharmacy, call can be transferred to refill team.   1. Which medications need to be refilled? (please list name of each medication and dose if known)  apixaban (ELIQUIS) 5 MG TABS tablet    2. Which pharmacy/location (including street and city if local pharmacy) is medication to be sent to? [place], [place] [address]  3. Do they need a 30 day or 90 day supply?   90 day supply

## 2021-02-20 NOTE — Telephone Encounter (Signed)
Prescription refill request for Eliquis received. Indication:Afib  Last office visit:01/01/21 ([place]) Scr: 0.73 (12/20/20)  Age: 66 Weight: 127.4kg  Appropriate dose and refill sent to requested pharmacy.

## 2021-02-24 ENCOUNTER — Other Ambulatory Visit: Payer: Self-pay | Admitting: Interventional Cardiology

## 2021-02-26 ENCOUNTER — Other Ambulatory Visit: Payer: 59 | Admitting: *Deleted

## 2021-02-26 ENCOUNTER — Other Ambulatory Visit: Payer: Self-pay

## 2021-02-26 ENCOUNTER — Telehealth: Payer: Self-pay | Admitting: Interventional Cardiology

## 2021-02-26 DIAGNOSIS — I480 Paroxysmal atrial fibrillation: Secondary | ICD-10-CM

## 2021-02-26 DIAGNOSIS — I10 Essential (primary) hypertension: Secondary | ICD-10-CM

## 2021-02-26 DIAGNOSIS — R55 Syncope and collapse: Secondary | ICD-10-CM

## 2021-02-26 DIAGNOSIS — E785 Hyperlipidemia, unspecified: Secondary | ICD-10-CM

## 2021-02-26 DIAGNOSIS — I779 Disorder of arteries and arterioles, unspecified: Secondary | ICD-10-CM

## 2021-02-26 DIAGNOSIS — I2510 Atherosclerotic heart disease of native coronary artery without angina pectoris: Secondary | ICD-10-CM

## 2021-02-26 LAB — LIPID PANEL
Chol/HDL Ratio: 3.1 ratio (ref 0.0–4.4)
Cholesterol, Total: 114 mg/dL (ref 100–199)
HDL: 37 mg/dL — ABNORMAL LOW (ref >39)
LDL Chol Calc (NIH): 63 mg/dL (ref 0–99)
Triglycerides: 66 mg/dL (ref 0–149)
VLDL Cholesterol Cal: 14 mg/dL (ref 5–40)

## 2021-02-26 LAB — HEPATIC FUNCTION PANEL
ALT: 15 IU/L (ref 0–32)
AST: 19 IU/L (ref 0–40)
Albumin: 4.3 g/dL (ref 3.8–4.8)
Alkaline Phosphatase: 89 IU/L (ref 44–121)
Bilirubin Total: 0.5 mg/dL (ref 0.0–1.2)
Bilirubin, Direct: 0.12 mg/dL (ref 0.00–0.40)
Total Protein: 7.2 g/dL (ref 6.0–8.5)

## 2021-02-26 MED ORDER — POTASSIUM CHLORIDE ER 10 MEQ PO TBCR: 10.0000 meq | EXTENDED_RELEASE_TABLET | Freq: Two times a day (BID) | ORAL | 3 refills | Status: DC

## 2021-02-26 NOTE — Telephone Encounter (Signed)
Pt's medication was sent to pt's pharmacy as requested. Confirmation received.  °

## 2021-02-26 NOTE — Telephone Encounter (Signed)
°*  STAT* If patient is at the pharmacy, call can be transferred to refill team.   1. Which medications need to be refilled? (please list name of each medication and dose if known) Potassium Chloride (KLOR-CON 10 PO)  2. Which pharmacy/location (including street and city if local pharmacy) is medication to be sent to? [place], [place] [address]  3. Do they need a 30 day or 90 day supply? 90 day

## 2021-03-10 ENCOUNTER — Other Ambulatory Visit: Payer: Self-pay | Admitting: Interventional Cardiology
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
## 2021-04-03 ENCOUNTER — Encounter: Payer: Self-pay | Admitting: Interventional Cardiology

## 2021-04-03 ENCOUNTER — Other Ambulatory Visit: Payer: Self-pay

## 2021-04-03 ENCOUNTER — Ambulatory Visit (INDEPENDENT_AMBULATORY_CARE_PROVIDER_SITE_OTHER): Payer: 59 | Admitting: Interventional Cardiology

## 2021-04-03 VITALS — BP 146/62 | HR 71 | Ht 62.5 in | Wt 279.0 lb

## 2021-04-03 DIAGNOSIS — I10 Essential (primary) hypertension: Secondary | ICD-10-CM | POA: Diagnosis not present

## 2021-04-03 DIAGNOSIS — I2510 Atherosclerotic heart disease of native coronary artery without angina pectoris: Secondary | ICD-10-CM

## 2021-04-03 DIAGNOSIS — E782 Mixed hyperlipidemia: Secondary | ICD-10-CM

## 2021-04-03 DIAGNOSIS — E785 Hyperlipidemia, unspecified: Secondary | ICD-10-CM

## 2021-04-03 DIAGNOSIS — I480 Paroxysmal atrial fibrillation: Secondary | ICD-10-CM

## 2021-04-03 DIAGNOSIS — D6869 Other thrombophilia: Secondary | ICD-10-CM

## 2021-04-03 DIAGNOSIS — I779 Disorder of arteries and arterioles, unspecified: Secondary | ICD-10-CM

## 2021-04-03 NOTE — Patient Instructions (Signed)
Medication Instructions:  °Your physician recommends that you continue on your current medications as directed. Please refer to the Current Medication list given to you today. ° °*If you need a refill on your cardiac medications before your next appointment, please call your pharmacy* ° ° °Lab Work: °none °If you have labs (blood work) drawn today and your tests are completely normal, you will receive your results only by: °MyChart Message (if you have MyChart) OR °A paper copy in the mail °If you have any lab test that is abnormal or we need to change your treatment, we will call you to review the results. ° ° °Testing/Procedures: °none ° ° °Follow-Up: °At [facility], you and your health needs are our priority.  As part of our continuing mission to provide you with exceptional heart care, we have created designated Provider Care Teams.  These Care Teams include your primary Cardiologist (physician) and Advanced Practice Providers (APPs -  Physician Assistants and Nurse Practitioners) who all work together to provide you with the care you need, when you need it. ° °We recommend signing up for the patient portal called "MyChart".  Sign up information is provided on this After Visit Summary.  MyChart is used to connect with patients for Virtual Visits (Telemedicine).  Patients are able to view lab/test results, encounter notes, upcoming appointments, etc.  Non-urgent messages can be sent to your provider as well.   °To learn more about what you can do with MyChart, go to [email or web address].   ° °Your next appointment:   °6 month(s) ° °The format for your next appointment:   °In Person ° °Provider:   °Jayadeep Varanasi, MD   ° ° °Other Instructions ° ° °

## 2021-04-16 ENCOUNTER — Other Ambulatory Visit: Payer: Self-pay

## 2021-04-16 ENCOUNTER — Ambulatory Visit
Admission: RE | Admit: 2021-04-16 | Discharge: 2021-04-16 | Disposition: A | Discharge status: Home / Self Care | Payer: 59 | Source: Ambulatory Visit

## 2021-04-16 VITALS — BP 146/77 | HR 78 | Temp 98.8°F | Resp 18

## 2021-04-16 DIAGNOSIS — J069 Acute upper respiratory infection, unspecified: Secondary | ICD-10-CM

## 2021-04-16 DIAGNOSIS — Z794 Long term (current) use of insulin: Secondary | ICD-10-CM

## 2021-04-16 DIAGNOSIS — R6889 Other general symptoms and signs: Secondary | ICD-10-CM | POA: Diagnosis not present

## 2021-04-16 DIAGNOSIS — J4540 Moderate persistent asthma, uncomplicated: Secondary | ICD-10-CM

## 2021-04-16 DIAGNOSIS — E119 Type 2 diabetes mellitus without complications: Secondary | ICD-10-CM

## 2021-04-16 DIAGNOSIS — R052 Subacute cough: Secondary | ICD-10-CM

## 2021-04-16 DIAGNOSIS — J3489 Other specified disorders of nose and nasal sinuses: Secondary | ICD-10-CM

## 2021-04-16 LAB — POCT FASTING CBG KUC MANUAL ENTRY: POCT Glucose (KUC): 165 mg/dL — AB (ref 70–99)

## 2021-04-16 MED ORDER — ALBUTEROL SULFATE HFA 108 (90 BASE) MCG/ACT IN AERS: 1.0000 | INHALATION_SPRAY | Freq: Four times a day (QID) | RESPIRATORY_TRACT | 0 refills | Status: AC | PRN

## 2021-04-16 MED ORDER — BENZONATATE 100 MG PO CAPS: 100.0000 mg | ORAL_CAPSULE | Freq: Three times a day (TID) | ORAL | 0 refills | Status: DC | PRN

## 2021-04-16 MED ORDER — PROMETHAZINE-DM 6.25-15 MG/5ML PO SYRP: 5.0000 mL | ORAL_SOLUTION | Freq: Every evening | ORAL | 0 refills | Status: DC | PRN

## 2021-04-16 NOTE — ED Provider Notes (Signed)
?[name] ? ? ?MRN: [id number] DOB: Jun 22, 1955 ? ?Subjective:  ? ?[name] is a 66 y.o. female presenting with pmh of Type 2 DM treated with insulin, asthma, allergic rhinitis for 3 day history of acute onset runny and stuffy nose, coughing, throat congestion. No chest pain, shob, wheezing. Would like a medication refill of tessalon as this helps her.  No smoking.  Does not want a COVID or flu test.  Diabetes is not well controlled. ? ?No current facility-administered medications for this encounter. ? ?Current Outpatient Medications:  ?  acetaminophen (TYLENOL) 325 MG tablet, Take 650 mg by mouth as needed., Disp: , Rfl:  ?  albuterol (PROVENTIL HFA;VENTOLIN HFA) 108 (90 Base) MCG/ACT inhaler, Inhale 1-2 puffs into the lungs every 6 (six) hours as needed for wheezing or shortness of breath., Disp: 1 Inhaler, Rfl: 0 ?  ALPRAZolam (XANAX) 0.5 MG tablet, Take 0.5 mg by mouth 2 (two) times daily as needed., Disp: , Rfl:  ?  amLODipine (NORVASC) 10 MG tablet, Take 1 tablet (10 mg total) by mouth daily., Disp: 90 tablet, Rfl: 3 ?  apixaban (ELIQUIS) 5 MG TABS tablet, Take 1 tablet (5 mg total) by mouth 2 (two) times daily., Disp: 180 tablet, Rfl: 1 ?  Ascorbic Acid (VITAMIN C) 500 MG CAPS, Take 1 tablet by mouth daily., Disp: , Rfl:  ?  aspirin EC 81 MG tablet, Take 1 tablet (81 mg total) by mouth daily., Disp: 90 tablet, Rfl: 3 ?  BREO ELLIPTA 100-25 MCG/INH AEPB, Inhale 1 puff into the lungs daily., Disp: , Rfl:  ?  carvedilol (COREG) 6.25 MG tablet, Take 1 tablet by mouth twice daily, Disp: 180 tablet, Rfl: 0 ?  Cholecalciferol (VITAMIN D-3 PO), Take 2,000 Units by mouth daily., Disp: , Rfl:  ?  escitalopram (LEXAPRO) 5 MG tablet, Take 5 mg by mouth daily., Disp: , Rfl:  ?  ezetimibe (ZETIA) 10 MG tablet, Take 1 tablet (10 mg total) by mouth daily., Disp: 90 tablet, Rfl: 3 ?  FREESTYLE LITE test strip, 1 each by Other route as directed. , Disp: , Rfl:  ?  furosemide (LASIX) 40 MG tablet, Take  2 tablets by mouth once daily, Disp: 180 tablet, Rfl: 2 ?  HUMULIN R 500 UNIT/ML SOLN injection, Inject 80-110 Units into the skin 2 (two) times daily with a meal. 80 units in the morning and 40 every evening. Sliding Scale., Disp: , Rfl:  ?  lisinopril (ZESTRIL) 40 MG tablet, Take 1 tablet (40 mg total) by mouth daily., Disp: 90 tablet, Rfl: 3 ?  nitroGLYCERIN (NITROSTAT) 0.4 MG SL tablet, Place 1 tablet (0.4 mg total) under the tongue every 5 (five) minutes as needed for chest pain (X 3 DOSES FOR CHEST PAIN)., Disp: 25 tablet, Rfl: 3 ?  ondansetron (ZOFRAN-ODT) 8 MG disintegrating tablet, Take 1 tablet (8 mg total) by mouth every 8 (eight) hours as needed for nausea or vomiting., Disp: 30 tablet, Rfl: 0 ?  OZEMPIC, 0.25 OR 0.5 MG/DOSE, 2 MG/1.5ML SOPN, Inject into the skin., Disp: , Rfl:  ?  pantoprazole (PROTONIX) 40 MG tablet, Take 40 mg by mouth daily., Disp: , Rfl:  ?  potassium chloride (KLOR-CON 10) 10 MEQ tablet, Take 1 tablet (10 mEq total) by mouth in the morning and at bedtime., Disp: 180 tablet, Rfl: 3 ?  rosuvastatin (CRESTOR) 40 MG tablet, Take 1 tablet by mouth once daily, Disp: 90 tablet, Rfl: 2 ?  TRULICITY 1.5 VV/7.4MO SOPN, Inject 1.5 mg  into the skin every 7 (seven) days., Disp: , Rfl:   ? ?Allergies  ?Allergen Reactions  ? Bystolic [Nebivolol Hcl] Palpitations  ? Crestor [Rosuvastatin] Other (See Comments)  ?  Couldn't tolerate Crestor 40 mg dose   ? Sulfa Antibiotics Anaphylaxis  ? Canagliflozin Other (See Comments)  ? Empagliflozin   ?  Other reaction(s): yeast infection  ? Meloxicam   ?  Other reaction(s): palpitations  ? Metformin And Related Diarrhea  ? Metformin Hcl   ?  Other reaction(s): stomach upset  ? Nebivolol Hcl   ?  Other reaction(s): syncope  ? Other   ?  Ok Edwards anything that has this in it makes her wheeze  ? Sitagliptin   ?  Other reaction(s): ??  ? ? ?Past Medical History:  ?Diagnosis Date  ? Arthritis   ? Asthma   ? Coronary artery disease   ? Diabetes mellitus   ? Diabetic  neuropathy ([place])   ? GERD (gastroesophageal reflux disease)   ? History of hiatal hernia   ? Hx of cardiovascular stress test   ? Lexiscan Myoview (10/15):  Medium area of ischemia in AL and IL distribution, cannot completely exclude shifting breast attenuation, EF 64%; Abnormal study  ? Hyperlipidemia   ? Hypertension   ? Mild carotid artery disease ([place])   ? Morbid obesity ([place])   ? PAF (paroxysmal atrial fibrillation) ([place])   ? Sleep apnea   ? does not wear CPAP  ? Stroke (cerebrum) ([place])   ? Syncope   ?  ? ?Past Surgical History:  ?Procedure Laterality Date  ? [place]  ? right  ? COLONOSCOPY    ? DILATION AND CURETTAGE OF UTERUS    ? HAND SURGERY    ? right  ? heart stent  2007  ? PARS PLANA VITRECTOMY Left 03/24/2017  ? Procedure: PARS PLANA VITRECTOMY WITH 25 GAUGE;  Surgeon: Hurman Horn, MD;  Location: [facility];  Service: Ophthalmology;  Laterality: Left;  ? UTERINE FIBROID SURGERY  2008  ? ? ?Family History  ?Problem Relation Age of Onset  ? Kidney disease Father   ? Stroke Father   ? Heart attack Father   ? Stroke Maternal Grandfather   ? Stroke Paternal Grandfather   ? Asthma Other   ? Hypertension Other   ? Diabetes Other   ? ? ?Social History  ? ?Tobacco Use  ? Smoking status: Former  ?  Types: Cigarettes  ?  Quit date: 08/07/1982  ?  Years since quitting: 38.7  ? Smokeless tobacco: Never  ?Vaping Use  ? Vaping Use: Never used  ?Substance Use Topics  ? Alcohol use: No  ? Drug use: No  ? ? ?ROS ? ? ?Objective:  ? ?Vitals: ?BP (!) 146/77 (BP Location: Right Arm)   Pulse 78   Temp 98.8 ?F (37.1 ?C) (Oral)   Resp 18   LMP  (LMP Unknown)   SpO2 93%  ? ?Physical Exam ?Constitutional:   ?   General: She is not in acute distress. ?   Appearance: Normal appearance. She is well-developed. She is obese. She is not ill-appearing, toxic-appearing or diaphoretic.  ?HENT:  ?   Head: Normocephalic and atraumatic.  ?   Nose: Congestion and rhinorrhea present.  ?   Mouth/Throat:  ?   Mouth: Mucous  membranes are moist.  ?Eyes:  ?   General: No scleral icterus.    ?   Right eye: No discharge.     ?  Left eye: No discharge.  ?   Extraocular Movements: Extraocular movements intact.  ?Cardiovascular:  ?   Rate and Rhythm: Normal rate.  ?   Heart sounds: No murmur heard. ?  No friction rub. No gallop.  ?Pulmonary:  ?   Effort: Pulmonary effort is normal. No respiratory distress.  ?   Breath sounds: No stridor. No wheezing, rhonchi or rales.  ?Chest:  ?   Chest wall: No tenderness.  ?Skin: ?   General: Skin is warm and dry.  ?Neurological:  ?   General: No focal deficit present.  ?   Mental Status: She is alert and oriented to person, place, and time.  ?Psychiatric:     ?   Mood and Affect: Mood normal.     ?   Behavior: Behavior normal.  ? ? ?Results for orders placed or performed during the hospital encounter of 04/16/21 (from the past 24 hour(s))  ?POCT CBG (manual entry)     Status: Abnormal  ? Collection Time: 04/16/21  9:33 AM  ?Result Value Ref Range  ? POCT Glucose ([facility]) 165 (A) 70 - 99 mg/dL  ? ? ?Assessment and Plan :  ? ?PDMP not reviewed this encounter. ? ?1. Viral URI   ?2. Subacute cough   ?3. Stuffy and runny nose   ?4. Throat congestion   ?5. Type 2 diabetes mellitus treated with insulin ([place])   ?6. Moderate persistent asthma without complication   ? ?Deferred imaging given clear cardiopulmonary exam, hemodynamically stable vital signs. Refilled her albuterol inhaler, use it as needed. Deferred imaging given clear cardiopulmonary exam, hemodynamically stable vital signs. Deferred respiratory testing, patient declined this. Suspect viral URI, viral syndrome. Physical exam findings reassuring and vital signs stable for discharge. Advised supportive care, offered symptomatic relief. Counseled patient on potential for adverse effects with medications prescribed/recommended today, ER and return-to-clinic precautions discussed, patient verbalized understanding.   ?  ?Jaynee Eagles, PA-C ?04/16/21 [id number] ? ?

## 2021-04-16 NOTE — ED Triage Notes (Signed)
Pt reports cough and congestion x 3 days. DayQuil Honey gives some relief.  ? ?Pt request Tessalon refill. ?

## 2021-04-16 NOTE — Discharge Instructions (Addendum)
We will manage this as a viral syndrome. For sore throat or cough try using a honey-based tea. Use 3 teaspoons of honey with juice squeezed from half lemon. Place shaved pieces of ginger into 1/2-1 cup of water and warm over stove top. Then mix the ingredients and repeat every 4 hours as needed. Please take Tylenol '500mg'$ -'650mg'$  once every 6 hours for fevers, aches and pains. Hydrate very well with at least 2 liters (64 ounces) of water. Eat light meals such as soups (chicken and noodles, chicken wild rice, vegetable).  Do not eat any foods that you are allergic to.  Start an antihistamine like Allegra for postnasal drainage, sinus congestion.  You can take this together with the cough medications as needed. I also refilled your albuterol inhaler and use this as needed.  ?

## 2021-04-17 ENCOUNTER — Emergency Department (HOSPITAL_COMMUNITY)
Admission: EM | Admit: 2021-04-17 | Discharge: 2021-04-18 | Disposition: A | Discharge status: Home / Self Care | Payer: Medicare Other | Attending: Emergency Medicine | Admitting: Emergency Medicine

## 2021-04-17 ENCOUNTER — Emergency Department (HOSPITAL_COMMUNITY): Payer: Medicare Other

## 2021-04-17 ENCOUNTER — Encounter (HOSPITAL_COMMUNITY): Payer: Self-pay | Admitting: *Deleted

## 2021-04-17 DIAGNOSIS — J45909 Unspecified asthma, uncomplicated: Secondary | ICD-10-CM | POA: Insufficient documentation

## 2021-04-17 DIAGNOSIS — Z79899 Other long term (current) drug therapy: Secondary | ICD-10-CM | POA: Insufficient documentation

## 2021-04-17 DIAGNOSIS — I10 Essential (primary) hypertension: Secondary | ICD-10-CM | POA: Insufficient documentation

## 2021-04-17 DIAGNOSIS — Z7982 Long term (current) use of aspirin: Secondary | ICD-10-CM | POA: Diagnosis not present

## 2021-04-17 DIAGNOSIS — I4891 Unspecified atrial fibrillation: Secondary | ICD-10-CM | POA: Diagnosis not present

## 2021-04-17 DIAGNOSIS — J209 Acute bronchitis, unspecified: Secondary | ICD-10-CM | POA: Insufficient documentation

## 2021-04-17 DIAGNOSIS — R0602 Shortness of breath: Secondary | ICD-10-CM | POA: Diagnosis present

## 2021-04-17 DIAGNOSIS — Z7901 Long term (current) use of anticoagulants: Secondary | ICD-10-CM | POA: Diagnosis not present

## 2021-04-17 DIAGNOSIS — I2510 Atherosclerotic heart disease of native coronary artery without angina pectoris: Secondary | ICD-10-CM | POA: Diagnosis not present

## 2021-04-17 LAB — BASIC METABOLIC PANEL
Anion gap: 9 (ref 5–15)
BUN: 14 mg/dL (ref 8–23)
CO2: 27 mmol/L (ref 22–32)
Calcium: 9 mg/dL (ref 8.9–10.3)
Chloride: 101 mmol/L (ref 98–111)
Creatinine, Ser: 0.79 mg/dL (ref 0.44–1.00)
GFR, Estimated: >60 mL/min (ref >60)
Glucose, Bld: 103 mg/dL — ABNORMAL HIGH (ref 70–99)
Potassium: 3.6 mmol/L (ref 3.5–5.1)
Sodium: 137 mmol/L (ref 135–145)

## 2021-04-17 LAB — CBC WITH DIFFERENTIAL/PLATELET
Abs Immature Granulocytes: 0.01 10*3/uL (ref 0.00–0.07)
Basophils Absolute: 0 10*3/uL (ref 0.0–0.1)
Basophils Relative: 1 %
Eosinophils Absolute: 0.1 10*3/uL (ref 0.0–0.5)
Eosinophils Relative: 1 %
HCT: 39.9 % (ref 36.0–46.0)
Hemoglobin: 13.6 g/dL (ref 12.0–15.0)
Immature Granulocytes: 0 %
Lymphocytes Relative: 49 %
Lymphs Abs: 2.6 10*3/uL (ref 0.7–4.0)
MCH: 27.6 pg (ref 26.0–34.0)
MCHC: 34.1 g/dL (ref 30.0–36.0)
MCV: 80.9 fL (ref 80.0–100.0)
Monocytes Absolute: 0.7 10*3/uL (ref 0.1–1.0)
Monocytes Relative: 14 %
Neutro Abs: 1.9 10*3/uL (ref 1.7–7.7)
Neutrophils Relative %: 35 %
Platelets: 265 10*3/uL (ref 150–400)
RBC: 4.93 MIL/uL (ref 3.87–5.11)
RDW: 14.6 % (ref 11.5–15.5)
WBC: 5.3 10*3/uL (ref 4.0–10.5)
nRBC: 0 % (ref 0.0–0.2)

## 2021-04-17 LAB — BRAIN NATRIURETIC PEPTIDE: B Natriuretic Peptide: 10 pg/mL (ref 0.0–100.0)

## 2021-04-17 MED ORDER — METHYLPREDNISOLONE SODIUM SUCC 125 MG IJ SOLR
125.0000 mg | Freq: Once | INTRAMUSCULAR | Status: AC
  Administered 2021-04-17: 23:00:00 125 mg via INTRAVENOUS
  Filled 2021-04-17: qty 2

## 2021-04-17 MED ORDER — IPRATROPIUM-ALBUTEROL 0.5-2.5 (3) MG/3ML IN SOLN
3.0000 mL | Freq: Once | RESPIRATORY_TRACT | Status: AC
  Administered 2021-04-17: 3 mL via RESPIRATORY_TRACT
  Filled 2021-04-17: qty 3

## 2021-04-17 MED ORDER — IOHEXOL 350 MG/ML SOLN
100.0000 mL | Freq: Once | INTRAVENOUS | Status: AC | PRN
  Administered 2021-04-17: 100 mL via INTRAVENOUS

## 2021-04-17 MED ORDER — ALBUTEROL SULFATE HFA 108 (90 BASE) MCG/ACT IN AERS
2.0000 | INHALATION_SPRAY | RESPIRATORY_TRACT | Status: DC | PRN
  Administered 2021-04-18: 2 via RESPIRATORY_TRACT
  Filled 2021-04-17: qty 6.7

## 2021-04-17 NOTE — ED Triage Notes (Signed)
Shortness of breath for over a week, worse with exertion ?

## 2021-04-17 NOTE — ED Provider Notes (Cosign Needed Addendum)
[facility] EMERGENCY DEPARTMENT Provider Note   CSN: [id number] Arrival date & time: 04/17/21  1702     History  Chief Complaint  Patient presents with   Shortness of Breath    [name] is a 66 y.o. female with a history significant for asthma, pneumonia, GERD, CAD, history of CVA, paroxysmal A-fib on Eliquis, hypertension and diabetes presenting for evaluation of persistent cough and shortness of breath with exertion.  She reports having seasonal allergy and asthma problems and her symptoms initially started with cold-like symptoms including nasal congestion and clear rhinorrhea.  She has had escalating cough which has been productive of a gray tinge sputum, she denies fevers but has had chills.  She also reports increasing shortness of breath and pain in her mid line upper back with deep inspiration.  She was seen at a urgent care center yesterday and was given an albuterol inhaler along with Tessalon and a Phenergan cough syrup, she feels more short of breath today.  She denies orthopnea, she has had intermittent wheezing, she reports having increasing difficulty speaking in full sentences this evening.  She denies anterior chest pain.  Also no nausea or vomiting, no abdominal pain.  The history is provided by the patient.      Home Medications Prior to Admission medications   Medication Sig Start Date End Date Taking? Authorizing Provider  azithromycin (ZITHROMAX) 250 MG tablet Take 1 tablet (250 mg total) by mouth daily. Take 1 tablet daily with your evening meal for 4 days 04/18/21  Yes Damarco Keysor, Almyra Free, PA-C  predniSONE (DELTASONE) 50 MG tablet 1 tablet daily with your evening meal x4 days 04/18/21  Yes Madalena Kesecker, Almyra Free, PA-C  acetaminophen (TYLENOL) 325 MG tablet Take 650 mg by mouth as needed.    [provider]  albuterol (VENTOLIN HFA) 108 (90 Base) MCG/ACT inhaler Inhale 1-2 puffs into the lungs every 6 (six) hours as needed for wheezing or shortness of breath. 04/16/21    Jaynee Eagles, PA-C  ALPRAZolam Duanne Moron) 0.5 MG tablet Take 0.5 mg by mouth 2 (two) times daily as needed. 11/21/20   [provider]  amLODipine (NORVASC) 10 MG tablet Take 1 tablet (10 mg total) by mouth daily. 01/20/21   Jettie Booze, MD  apixaban (ELIQUIS) 5 MG TABS tablet Take 1 tablet (5 mg total) by mouth 2 (two) times daily. 02/20/21 05/21/21  Jettie Booze, MD  Ascorbic Acid (VITAMIN C) 500 MG CAPS Take 1 tablet by mouth daily.    [provider]  aspirin EC 81 MG tablet Take 1 tablet (81 mg total) by mouth daily. 03/03/19   Jettie Booze, MD  benzonatate (TESSALON) 100 MG capsule Take 1-2 capsules (100-200 mg total) by mouth 3 (three) times daily as needed for cough. 04/16/21   Jaynee Eagles, PA-C  BREO ELLIPTA 100-25 MCG/INH AEPB Inhale 1 puff into the lungs daily. 09/21/16   [provider]  carvedilol (COREG) 6.25 MG tablet Take 1 tablet by mouth twice daily 12/19/20   Jettie Booze, MD  Cholecalciferol (VITAMIN D-3 PO) Take 2,000 Units by mouth daily.    [provider]  escitalopram (LEXAPRO) 5 MG tablet Take 5 mg by mouth daily. 11/22/20   [provider]  ezetimibe (ZETIA) 10 MG tablet Take 1 tablet (10 mg total) by mouth daily. 01/01/21 04/03/21  Dunn, Nedra Hai, PA-C  FREESTYLE LITE test strip 1 each by Other route as directed.  07/08/11   [provider]  furosemide (  LASIX) 40 MG tablet Take 2 tablets by mouth once daily 03/10/21   Jettie Booze, MD  HUMULIN R 500 UNIT/ML SOLN injection Inject 80-110 Units into the skin 2 (two) times daily with a meal. 80 units in the morning and 40 every evening. Sliding Scale. 12/25/13   [provider]  lisinopril (ZESTRIL) 40 MG tablet Take 1 tablet (40 mg total) by mouth daily. 01/20/21   Jettie Booze, MD  nitroGLYCERIN (NITROSTAT) 0.4 MG SL tablet Place 1 tablet (0.4 mg total) under the tongue every 5 (five) minutes as needed for chest pain (X 3 DOSES  FOR CHEST PAIN). 11/30/17   Jettie Booze, MD  ondansetron (ZOFRAN-ODT) 8 MG disintegrating tablet Take 1 tablet (8 mg total) by mouth every 8 (eight) hours as needed for nausea or vomiting. 05/12/20   Scot Jun, FNP  OZEMPIC, 0.25 OR 0.5 MG/DOSE, 2 MG/1.5ML SOPN Inject into the skin. 04/02/21   [provider]  pantoprazole (PROTONIX) 40 MG tablet Take 40 mg by mouth daily.    [provider]  potassium chloride (KLOR-CON 10) 10 MEQ tablet Take 1 tablet (10 mEq total) by mouth in the morning and at bedtime. 02/26/21   Jettie Booze, MD  promethazine-dextromethorphan (PROMETHAZINE-DM) 6.25-15 MG/5ML syrup Take 5 mLs by mouth at bedtime as needed for cough. 04/16/21   Jaynee Eagles, PA-C  rosuvastatin (CRESTOR) 40 MG tablet Take 1 tablet by mouth once daily 03/10/21   Jettie Booze, MD  TRULICITY 1.5 BD/5.3GD SOPN Inject 1.5 mg into the skin every 7 (seven) days. 09/30/20   [provider]      Allergies    Bystolic [nebivolol hcl], Crestor [rosuvastatin], Sulfa antibiotics, Canagliflozin, Empagliflozin, Meloxicam, Metformin and related, Metformin hcl, Nebivolol hcl, Other, and Sitagliptin    Review of Systems   Review of Systems  Constitutional:  Negative for chills and fever.  HENT:  Negative for congestion and sore throat.   Eyes: Negative.   Respiratory:  Positive for cough, shortness of breath and wheezing. Negative for chest tightness.   Gastrointestinal:  Negative for abdominal pain and nausea.  Genitourinary: Negative.   Musculoskeletal:  Positive for back pain. Negative for arthralgias, joint swelling and neck pain.  Skin: Negative.  Negative for rash and wound.  Neurological:  Negative for dizziness, weakness, light-headedness, numbness and headaches.  Psychiatric/Behavioral: Negative.     Physical Exam Updated Vital Signs BP (!) 106/50    Pulse 84    Temp 99.4 F (37.4 C) (Oral)    Resp 15    Ht '5\' 2"'$  (1.575 m)    Wt 124.7 kg     LMP  (LMP Unknown)    SpO2 94%    BMI 50.30 kg/m  Physical Exam Vitals and nursing note reviewed.  Constitutional:      Appearance: She is well-developed.  HENT:     Head: Normocephalic and atraumatic.  Eyes:     Conjunctiva/sclera: Conjunctivae normal.  Cardiovascular:     Rate and Rhythm: Normal rate and regular rhythm.     Heart sounds: Normal heart sounds.  Pulmonary:     Effort: Pulmonary effort is normal.     Breath sounds: Examination of the right-upper field reveals rhonchi. Decreased breath sounds and rhonchi present. No wheezing or rales.     Comments: Prolonged expirations, no active wheezing.  Fair air movement is present. Abdominal:     General: Bowel sounds are normal.     Palpations: Abdomen is soft.  Tenderness: There is no abdominal tenderness.  Musculoskeletal:        General: Normal range of motion.     Cervical back: Normal range of motion.     Right lower leg: No tenderness. No edema.     Left lower leg: No tenderness. No edema.     Comments: No reproducible back pain upon palpation  Skin:    General: Skin is warm and dry.  Neurological:     Mental Status: She is alert.    ED Results / Procedures / Treatments   Labs (all labs ordered are listed, but only abnormal results are displayed) Labs Reviewed  BASIC METABOLIC PANEL - Abnormal; Notable for the following components:      Result Value   Glucose, Bld 103 (*)    All other components within normal limits  CBC WITH DIFFERENTIAL/PLATELET  BRAIN NATRIURETIC PEPTIDE    EKG EKG Interpretation  Date/Time:  Thursday April 17 2021 19:43:06 EST Ventricular Rate:  84 PR Interval:  144 QRS Duration: 81 QT Interval:  357 QTC Calculation: 422 R Axis:   37 Text Interpretation: Sinus rhythm Low voltage, precordial leads Confirmed by Fredia Sorrow [phone]) on 04/17/2021 8:49:17 PM  Radiology CT Angio Chest PE W and/or Wo Contrast  Result Date: 04/18/2021 CLINICAL DATA:  Shortness of breath for 1  week EXAM: CT ANGIOGRAPHY CHEST WITH CONTRAST TECHNIQUE: Multidetector CT imaging of the chest was performed using the standard protocol during bolus administration of intravenous contrast. Multiplanar CT image reconstructions and MIPs were obtained to evaluate the vascular anatomy. RADIATION DOSE REDUCTION: This exam was performed according to the departmental dose-optimization program which includes automated exposure control, adjustment of the mA and/or kV according to patient size and/or use of iterative reconstruction technique. CONTRAST:  145m OMNIPAQUE IOHEXOL 350 MG/ML SOLN COMPARISON:  Chest x-ray from earlier in the same day, CT from 05/20/2020. FINDINGS: Cardiovascular: Thoracic aorta demonstrates atherosclerotic calcifications without aneurysmal dilatation or dissection. Coronary calcifications are noted. No cardiac enlargement is noted. No pericardial effusion is seen. The pulmonary artery as visualized shows a normal branching pattern. No intraluminal filling defect to suggest pulmonary embolism is noted. Mediastinum/Nodes: Thoracic inlet is within normal limits. No sizable hilar or mediastinal adenopathy is noted. The esophagus is well visualized and within normal limits. Lungs/Pleura: Lungs demonstrate mild right basilar atelectasis. Mild emphysematous changes are noted. Patchy airspace opacity is noted in the left upper lobe which is likely postinflammatory in nature. No discrete nodule is seen. Upper Abdomen: Visualized upper abdomen shows nonobstructing renal calculi on the right. A cyst is noted within the right kidney with dependent calcium within. This is stable in appearance from the prior exam. Visualized left kidney is unremarkable. Musculoskeletal: Degenerative changes of the thoracic spine are noted. No acute rib abnormality is seen. Review of the MIP images confirms the above findings. IMPRESSION: No evidence of pulmonary emboli. Mild patchy airspace opacity in the left upper lobe likely  postinflammatory in nature. Chronic nonobstructing calculi in the right kidney as well as a cyst with dependent calcification within. These changes are stable from 2022. Aortic Atherosclerosis (ICD10-I70.0) and Emphysema (ICD10-J43.9). Electronically Signed   By: MInez CatalinaM.D.   On: 04/18/2021 00:06   DG Chest Portable 1 View  Result Date: 04/17/2021 CLINICAL DATA:  Shortness of breath and intermittent chest pain. EXAM: PORTABLE CHEST 1 VIEW COMPARISON:  December 28, 2020 FINDINGS: Chronic appearing increased interstitial lung markings are seen. Mild linear atelectasis is noted within the right lung base.  There is persistent marked severity elevation of the right hemidiaphragm. There is no evidence of a pleural effusion or pneumothorax. The heart size and mediastinal contours are within normal limits. The visualized skeletal structures are unremarkable. IMPRESSION: Mild right basilar linear atelectasis. Electronically Signed   By: Virgina Norfolk M.D.   On: 04/17/2021 20:01    Procedures Procedures    Medications Ordered in ED Medications  albuterol (VENTOLIN HFA) 108 (90 Base) MCG/ACT inhaler 2 puff (has no administration in time range)  azithromycin (ZITHROMAX) tablet 500 mg (has no administration in time range)  AeroChamber Plus Flo-Vu Medium MISC 1 each (has no administration in time range)  methylPREDNISolone sodium succinate (SOLU-MEDROL) 125 mg/2 mL injection 125 mg (125 mg Intravenous Given 04/17/21 2322)  ipratropium-albuterol (DUONEB) 0.5-2.5 (3) MG/3ML nebulizer solution 3 mL (3 mLs Nebulization Given 04/17/21 2353)  iohexol (OMNIPAQUE) 350 MG/ML injection 100 mL (100 mLs Intravenous Contrast Given 04/17/21 2345)    ED Course/ Medical Decision Making/ A&P                           Medical Decision Making Patient with a history significant for seasonal asthma, but also with CAD, paroxysmal atrial fibrillation on Eliquis, hypertension and diabetes with persistent cough, shortness of  breath and intermittent wheezing.  Seen at urgent care yesterday and given albuterol, Tessalon and Promethazine DM but with persistent symptoms.  Also describing pleuritic pain mid upper back with deep inspiration.  Although she is on Eliquis, this raises concern for possible PE.    Amount and/or Complexity of Data Reviewed Labs: ordered. Radiology: ordered.    Details: Her plain film x-ray was negative for pneumonia, pleural effusion or pneumothorax.  CT angio was completed after labs confirmed normal kidney function, no PE, there is patchy airspace opacity in the left upper lobe, she does have some crackles in her upper fields on exam, no wheezing appreciated.  Risk Prescription drug management. Decision regarding hospitalization. Risk Details: Given risk factors and prior history of frequent pneumonia, she will be covered with antibiotics and prednisone pulse dosing.  She already has an albuterol MDI, she was provided a spacer for more effective treatment with this medication.  We also discussed keeping a close watch on her blood glucose levels while she is on this prednisone.  Advise close follow-up with her PCP or returning here if any worsening symptoms develop.  At discharge patient also requested Diflucan tablet as antibiotics frequently gives her a vaginal yeast infection.  Diflucan 150 mg was E scribed to her pharmacy.           Final Clinical Impression(s) / ED Diagnoses Final diagnoses:  Acute bronchitis, unspecified organism    Rx / DC Orders ED Discharge Orders          Ordered    azithromycin (ZITHROMAX) 250 MG tablet  Daily        04/18/21 0045    predniSONE (DELTASONE) 50 MG tablet        04/18/21 0045              Evalee Jefferson, PA-C 04/18/21 0113    Evalee Jefferson, PA-C 04/18/21 0114

## 2021-04-17 NOTE — ED Notes (Signed)
Patient transported to CT 

## 2021-04-17 NOTE — ED Notes (Signed)
Pt back from CT

## 2021-04-18 MED ORDER — AZITHROMYCIN 250 MG PO TABS: 250.0000 mg | ORAL_TABLET | Freq: Every day | ORAL | 0 refills | Status: DC

## 2021-04-18 MED ORDER — FLUCONAZOLE 150 MG PO TABS: 150.0000 mg | ORAL_TABLET | Freq: Every day | ORAL | 0 refills | Status: AC

## 2021-04-18 MED ORDER — AEROCHAMBER PLUS FLO-VU MEDIUM MISC
1.0000 | Freq: Once | Status: AC
  Administered 2021-04-18: 1
  Filled 2021-04-18: qty 1

## 2021-04-18 MED ORDER — PREDNISONE 50 MG PO TABS: ORAL_TABLET | ORAL | 0 refills | Status: DC

## 2021-04-18 MED ORDER — AZITHROMYCIN 250 MG PO TABS
500.0000 mg | ORAL_TABLET | Freq: Once | ORAL | Status: AC
Start: 2021-04-18 — End: 2021-04-18
  Administered 2021-04-18: 500 mg via ORAL
  Filled 2021-04-18: qty 2

## 2021-04-18 NOTE — Progress Notes (Signed)
Patient educated on MDI use with spacer. ?

## 2021-04-18 NOTE — Discharge Instructions (Addendum)
Take your next dose of the Zithromax antibiotic and your prednisone tomorrow evening.  Continue using your albuterol inhaler, taking 2 puffs every 4 hours if needed for shortness of breath or wheezing.  You may continue using the other medications you are prescribed at the urgent care center as well including cough syrup.  Call your doctor for recheck if you develop any new or worsening symptoms. ?

## 2021-05-20 ENCOUNTER — Telehealth: Payer: Self-pay | Admitting: Interventional Cardiology

## 2021-05-20 NOTE — Telephone Encounter (Signed)
?  Patient is calling to find out when she can start back driving again. Please advise. ?
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
## 2021-05-26 ENCOUNTER — Other Ambulatory Visit: Payer: Self-pay | Admitting: *Deleted

## 2021-05-26 DIAGNOSIS — I480 Paroxysmal atrial fibrillation: Secondary | ICD-10-CM

## 2021-05-26 NOTE — Addendum Note (Signed)
Addended by: Levonne Hubert on: 05/26/2021 03:23 PM ? ? Modules accepted: Orders ? ?

## 2021-05-26 NOTE — Telephone Encounter (Signed)
Printed copy of monitor results are now available.  I spoke with patient. She reports cardiology Melina Copa, PA) told her not to drive.  Patient reports neurology did not mention any driving restrictions to her.  I told patient we would contact her once monitor reviewed by Dr Irish Lack ?
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
## 2021-05-27 NOTE — Telephone Encounter (Signed)
-----   Message from Jettie Booze, MD sent at 05/26/2021  6:03 PM EDT ----- ?? Normal sinus rhythm with sinus tach and rare PACs and PVCs. ?? No cause of syncope identified. ?? No atrial fibrillation. ? ?OK to resume driving if no syncope in the last 6 months ?

## 2021-05-27 NOTE — Telephone Encounter (Signed)
Patient notified.  She has not had any syncopal episodes since November 27, 2020 ?

## 2021-06-11 ENCOUNTER — Telehealth: Payer: Self-pay | Admitting: *Deleted

## 2021-06-11 ENCOUNTER — Other Ambulatory Visit: Payer: Self-pay | Admitting: Gastroenterology

## 2021-06-11 NOTE — Telephone Encounter (Signed)
Will route to pharm then needs tele visit. Last OV 03/2021. Would probably recommend family be present on tele call if possible too given h/o memory issues. ?

## 2021-06-11 NOTE — Telephone Encounter (Signed)
? ?  Pre-operative Risk Assessment  ?  ?Patient Name: [name]  ?DOB: 05-Nov-1955 ?MRN: [id number]  ? ?  ? ?Request for Surgical Clearance   ? ?Procedure:   COLONOSCOPY / ENDOSCOPY ? ?Date of Surgery:  Clearance 07/22/21                              ?   ?Surgeon:  DR. KARKI ?Surgeon's Group or Practice Name:  [facility] ?Phone number:  [phone] ?Fax number:  [phone] ?  ?Type of Clearance Requested:   ?- Pharmacy:  Hold Apixaban (Eliquis) X'S 2 DAYS ?  ?Type of Anesthesia:   NOT INDICATED ?  ?Additional requests/questions:   ? ?Signed, ?Jeanann Lewandowsky   ?06/11/2021, 2:12 PM  ? ?

## 2021-06-11 NOTE — Telephone Encounter (Signed)
? ? ?  Name: [name]  ?DOB: 06-May-1955  ?MRN: [id number] ? ?Primary Cardiologist: Larae Grooms, MD ? ? ?Preoperative team, please contact this patient and set up a phone call appointment for further preoperative risk assessment. Please obtain consent and complete medication review. Thank you for your help. ? ?I confirm that guidance regarding antiplatelet and oral anticoagulation therapy has been completed and, if necessary, noted below. ? ? ? ?[name], PA-C ?06/11/2021, 4:29 PM ?[facility] ?[address] ?[place], [place] [address] ? ? ?

## 2021-06-11 NOTE — Telephone Encounter (Signed)
Patient with diagnosis of afib on Eliquis for anticoagulation.   ? ?Procedure: colonoscopy/endoscopy ?Date of procedure: 07/22/21 ? ?CHA2DS2-VASc Score = 7  ?This indicates a 11.2% annual risk of stroke. ?The patient's score is based upon: ?CHF History: 0 ?HTN History: 1 ?Diabetes History: 1 ?Stroke History: 2 ?Vascular Disease History: 1 ?Age Score: 1 ?Gender Score: 1 ? ?CVA 11/2020 ? ?CrCl 13m/min using adjusted body weight due to obesity ?Platelet count 265K ? ?Per office protocol, recommend that pt only hold Eliquis for 1 day prior to procedure and resume as soon as safely afterwards given elevated CV risk.  ?

## 2021-06-12 NOTE — Telephone Encounter (Signed)
Left message for the patient to contact the office. 

## 2021-06-12 NOTE — Telephone Encounter (Signed)
Patient is returning phone call to set up telehealth visit. Please call back ?

## 2021-06-12 NOTE — Telephone Encounter (Signed)
Contacted the patient to schedule a telehealth visit. Patient states she has her niece help with her appointments so she does not get confused. She states she is going to contact her niece to get her availability and then call us back.  ?

## 2021-06-13 NOTE — Telephone Encounter (Signed)
Left message on patient and niece voicemail advising the patient to contact the office for virtual appointment.  ?

## 2021-06-16 ENCOUNTER — Other Ambulatory Visit: Payer: Self-pay | Admitting: *Deleted

## 2021-06-16 MED ORDER — EZETIMIBE 10 MG PO TABS: 10.0000 mg | ORAL_TABLET | Freq: Every day | ORAL | 3 refills | Status: DC

## 2021-06-16 MED ORDER — APIXABAN 5 MG PO TABS: 5.0000 mg | ORAL_TABLET | Freq: Two times a day (BID) | ORAL | 1 refills | Status: DC

## 2021-06-16 NOTE — Telephone Encounter (Signed)
Appointment scheduled 06/17/2021 meds reconciled/consent on file.  ? ?  ?Patient Consent for Virtual Visit  ? ? ?   ? ?[name] has provided verbal consent on 06/16/2021 for a virtual visit (video or telephone). ? ? ?CONSENT FOR VIRTUAL VISIT FOR:  [name]  ?By participating in this virtual visit I agree to the following: ? ?I hereby voluntarily request, consent and authorize [facility] and its employed or contracted physicians, physician assistants, nurse practitioners or other licensed health care professionals (the Practitioner), to provide me with telemedicine health care services (the ?Services") as deemed necessary by the treating Practitioner. I acknowledge and consent to receive the Services by the Practitioner via telemedicine. I understand that the telemedicine visit will involve communicating with the Practitioner through live audiovisual communication technology and the disclosure of certain medical information by electronic transmission. I acknowledge that I have been given the opportunity to request an in-person assessment or other available alternative prior to the telemedicine visit and am voluntarily participating in the telemedicine visit. ? ?I understand that I have the right to withhold or withdraw my consent to the use of telemedicine in the course of my care at any time, without affecting my right to future care or treatment, and that the Practitioner or I may terminate the telemedicine visit at any time. I understand that I have the right to inspect all information obtained and/or recorded in the course of the telemedicine visit and may receive copies of available information for a reasonable fee.  I understand that some of the potential risks of receiving the Services via telemedicine include:  ?Delay or interruption in medical evaluation due to technological equipment failure or disruption; ?Information transmitted may not be sufficient (e.g. poor resolution of images) to  allow for appropriate medical decision making by the Practitioner; and/or  ?In rare instances, security protocols could fail, causing a breach of personal health information. ? ?Furthermore, I acknowledge that it is my responsibility to provide information about my medical history, conditions and care that is complete and accurate to the best of my ability. I acknowledge that Practitioner's advice, recommendations, and/or decision may be based on factors not within their control, such as incomplete or inaccurate data provided by me or distortions of diagnostic images or specimens that may result from electronic transmissions. I understand that the practice of medicine is not an exact science and that Practitioner makes no warranties or guarantees regarding treatment outcomes. I acknowledge that a copy of this consent can be made available to me via my patient portal ([facility]), or I can request a printed copy by calling the office of [facility].   ? ?I understand that my insurance will be billed for this visit.  ? ?I have read or had this consent read to me. ?I understand the contents of this consent, which adequately explains the benefits and risks of the Services being provided via telemedicine.  ?I have been provided ample opportunity to ask questions regarding this consent and the Services and have had my questions answered to my satisfaction. ?I give my informed consent for the services to be provided through the use of telemedicine in my medical care ? ? ? ? ?. ? ?

## 2021-06-16 NOTE — Telephone Encounter (Signed)
Pt is scheduled for a telephone appointment. 06/17/2021. ?

## 2021-06-17 ENCOUNTER — Ambulatory Visit (INDEPENDENT_AMBULATORY_CARE_PROVIDER_SITE_OTHER): Payer: Medicare Other | Admitting: Physician Assistant

## 2021-06-17 DIAGNOSIS — Z01810 Encounter for preprocedural cardiovascular examination: Secondary | ICD-10-CM

## 2021-06-17 NOTE — Progress Notes (Signed)
? ?Virtual Visit via Telephone Note  ? ?This visit type was conducted due to national recommendations for restrictions regarding the COVID-19 Pandemic (e.g. social distancing) in an effort to limit this patient's exposure and mitigate transmission in our community.  Due to her co-morbid illnesses, this patient is at least at moderate risk for complications without adequate follow up.  This format is felt to be most appropriate for this patient at this time.  The patient did not have access to video technology/had technical difficulties with video requiring transitioning to audio format only (telephone).  All issues noted in this document were discussed and addressed.  No physical exam could be performed with this format.  Please refer to the patient's chart for her  consent to telehealth for [facility]. ? ?Evaluation Performed:  Preoperative cardiovascular risk assessment ?_____________  ? ?Date:  06/17/2021  ? ?Patient ID:  [name], [name] 08-May-1955, MRN [id number] ?Patient Location:  ?Home ?Provider location:   ?Office ? ?Primary Care Provider:  Carol Ada, MD ?Primary Cardiologist:  Larae Grooms, MD ? ?Chief Complaint  ?  ?66 y.o. y/o female with a h/o CAD, obesity, HTN, HLD, h/o CVA and PAF, who is pending colonoscopy, and presents today for telephonic preoperative cardiovascular risk assessment. ? ?Past Medical History  ?  ?Past Medical History:  ?Diagnosis Date  ? Arthritis   ? Asthma   ? Coronary artery disease   ? Diabetes mellitus   ? Diabetic neuropathy ([place])   ? GERD (gastroesophageal reflux disease)   ? History of hiatal hernia   ? Hx of cardiovascular stress test   ? Lexiscan Myoview (10/15):  Medium area of ischemia in AL and IL distribution, cannot completely exclude shifting breast attenuation, EF 64%; Abnormal study  ? Hyperlipidemia   ? Hypertension   ? Mild carotid artery disease ([place])   ? Morbid obesity ([place])   ? PAF (paroxysmal atrial fibrillation) ([place])   ? Sleep apnea   ? does  not wear CPAP  ? Stroke (cerebrum) ([place])   ? Syncope   ? ?Past Surgical History:  ?Procedure Laterality Date  ? [place]  ? right  ? COLONOSCOPY    ? DILATION AND CURETTAGE OF UTERUS    ? HAND SURGERY    ? right  ? heart stent  2007  ? PARS PLANA VITRECTOMY Left 03/24/2017  ? Procedure: PARS PLANA VITRECTOMY WITH 25 GAUGE;  Surgeon: Hurman Horn, MD;  Location: [facility];  Service: Ophthalmology;  Laterality: Left;  ? UTERINE FIBROID SURGERY  2008  ? ? ?Allergies ? ?Allergies  ?Allergen Reactions  ? Bystolic [Nebivolol Hcl] Palpitations  ? Crestor [Rosuvastatin] Other (See Comments)  ?  Couldn't tolerate Crestor 40 mg dose   ? Sulfa Antibiotics Anaphylaxis  ? Canagliflozin Other (See Comments)  ? Empagliflozin   ?  Other reaction(s): yeast infection  ? Meloxicam   ?  Other reaction(s): palpitations  ? Metformin And Related Diarrhea  ? Metformin Hcl   ?  Other reaction(s): stomach upset  ? Nebivolol Hcl   ?  Other reaction(s): syncope  ? Other   ?  Ok Edwards anything that has this in it makes her wheeze  ? Sitagliptin   ?  Other reaction(s): ??  ? ? ?History of Present Illness  ?  ?[name] is a 66 y.o. female who presents via audio/video conferencing for a telehealth visit today.  Pt was last seen in cardiology clinic on 04/03/2021 by Dr.  Ione.  At that time [name] was doing well.  The patient is now pending procedure as outlined above. Since her last visit, she has been doing well without any chest pain or worsening shortness of breath.  Patient was treated for acute bronchitis back in March, breathing issue has resolved.  ? ? ?Home Medications  ?  ?Prior to Admission medications   ?Medication Sig Start Date End Date Taking? Authorizing Provider  ?acetaminophen (TYLENOL) 325 MG tablet Take 650 mg by mouth as needed.    [provider]  ?albuterol (VENTOLIN HFA) 108 (90 Base) MCG/ACT inhaler Inhale 1-2 puffs into the lungs every 6 (six) hours as needed for wheezing or  shortness of breath. 04/16/21   Jaynee Eagles, PA-C  ?ALPRAZolam (XANAX) 0.5 MG tablet Take 0.5 mg by mouth 2 (two) times daily as needed. 11/21/20   [provider]  ?amLODipine (NORVASC) 10 MG tablet Take 1 tablet (10 mg total) by mouth daily. 01/20/21   Jettie Booze, MD  ?apixaban (ELIQUIS) 5 MG TABS tablet Take 1 tablet (5 mg total) by mouth 2 (two) times daily. 06/16/21 09/14/21  Leanor Kail, PA  ?aspirin EC 81 MG tablet Take 1 tablet (81 mg total) by mouth daily. 03/03/19   Jettie Booze, MD  ?benzonatate (TESSALON) 100 MG capsule Take 1-2 capsules (100-200 mg total) by mouth 3 (three) times daily as needed for cough. 04/16/21   Jaynee Eagles, PA-C  ?BREO ELLIPTA 100-25 MCG/INH AEPB Inhale 1 puff into the lungs daily. 09/21/16   [provider]  ?carvedilol (COREG) 6.25 MG tablet Take 1 tablet by mouth twice daily 12/19/20   Jettie Booze, MD  ?Cholecalciferol (VITAMIN D-3 PO) Take 2,000 Units by mouth daily.    [provider]  ?escitalopram (LEXAPRO) 5 MG tablet Take 5 mg by mouth daily. 11/22/20   [provider]  ?ezetimibe (ZETIA) 10 MG tablet Take 1 tablet (10 mg total) by mouth daily. 06/16/21 09/14/21  Leanor Kail, PA  ?FREESTYLE LITE test strip 1 each by Other route as directed.  07/08/11   [provider]  ?furosemide (LASIX) 40 MG tablet Take 2 tablets by mouth once daily 03/10/21   Jettie Booze, MD  ?HUMULIN R 500 UNIT/ML SOLN injection Inject 80-110 Units into the skin 2 (two) times daily with a meal. 80 units in the morning and 40 every evening. Sliding Scale. 12/25/13   [provider]  ?lisinopril (ZESTRIL) 40 MG tablet Take 1 tablet (40 mg total) by mouth daily. 01/20/21   Jettie Booze, MD  ?nitroGLYCERIN (NITROSTAT) 0.4 MG SL tablet Place 1 tablet (0.4 mg total) under the tongue every 5 (five) minutes as needed for chest pain (X 3 DOSES FOR CHEST PAIN). 11/30/17   Jettie Booze, MD  ?ondansetron  (ZOFRAN-ODT) 8 MG disintegrating tablet Take 1 tablet (8 mg total) by mouth every 8 (eight) hours as needed for nausea or vomiting. 05/12/20   Scot Jun, FNP  ?OZEMPIC, 0.25 OR 0.5 MG/DOSE, 2 MG/1.5ML SOPN Inject into the skin. 04/02/21   [provider]  ?pantoprazole (PROTONIX) 40 MG tablet Take 40 mg by mouth daily.    [provider]  ?potassium chloride (KLOR-CON 10) 10 MEQ tablet Take 1 tablet (10 mEq total) by mouth in the morning and at bedtime. 02/26/21   Jettie Booze, MD  ?promethazine-dextromethorphan (PROMETHAZINE-DM) 6.25-15 MG/5ML syrup Take 5 mLs by mouth at bedtime as needed for cough. 04/16/21   Bess Harvest,  Freida Busman, PA-C  ?rosuvastatin (CRESTOR) 40 MG tablet Take 1 tablet by mouth once daily 03/10/21   Jettie Booze, MD  ? ? ?Physical Exam  ?  ?Vital Signs:  [name] does not have vital signs available for review today. ? ?Given telephonic nature of communication, physical exam is limited. ?AAOx3. NAD. Normal affect.  Speech and respirations are unlabored. ? ?Accessory Clinical Findings  ?  ?None ? ?Assessment & Plan  ?  ?1.  Preoperative Cardiovascular Risk Assessment: ?  -Patient has upcoming colonoscopy procedure by Dr. Therisa Doyne.  She denies any recent exertional chest pain or worsening shortness of breath.  Last cardiac catheterization was in 2012.  Last Myoview was normal in 2015.  Recent EKG obtained in March 2023 showed normal sinus rhythm.  Patient may proceed with colonoscopy procedure without further work-up given lack of exertional symptoms.  Patient may hold Eliquis for 1 day prior to the procedure and restart as soon as possible afterward at the surgeon's discretion. ?  ? ?A copy of this note will be routed to requesting surgeon. ? ?Time:   ?Today, I have spent 6 minutes with the patient with telehealth technology discussing medical history, symptoms, and management plan.   ? ? ?Almyra Deforest, [place] ? ?06/17/2021, 10:59 AM ? ?

## 2021-06-25 ENCOUNTER — Telehealth: Payer: Self-pay | Admitting: *Deleted

## 2021-06-25 NOTE — Telephone Encounter (Signed)
I spoke with patient and let her know handicap parking application was completed.  She would like to pick up in office.  Form left at front desk.  ?

## 2021-07-11 ENCOUNTER — Encounter (HOSPITAL_COMMUNITY): Payer: Self-pay | Admitting: Gastroenterology

## 2021-07-21 NOTE — H&P (Signed)
History of Present Illness  General:          66 year old female, previous patient of Dr. Penelope Coop, is referred by Dr. Tamala Julian to schedule repeat colonoscopy.        She has Multiple health comorbidities: Type 2 diabetes, hypertension, morbid obesity (BMI 49.74), sleep apnea, CAD, cardiac stent (2007), stroke 11/2020, takes Eliquis twice a day & 107m Aspirin QD, Asthma, Paroxysmal Atrial Fibrillation, & GERD. Cardiologist Dr. VScarlette Calico She has some short term memory loss after her stroke. Is here today with a friend.        Last colonoscopy 11/2010 by Dr. GPenelope Coopwas normal. Repeat screening colonoscopy recommended in 10 years (overdue).        She has occasional loose urgent bowel movement after eating. Stools are formed and loose. She has not had any constipation, melena, or hematochezia. She reports having increased epigastric pain for several months. She denies heartburn or dysphagia. Admits to abdominal bloating and gas. She is requesting to have an EGD to evaluate her epigastric pain.        Of note, [facility] for [facility]. Chest CT was negative for PE. Chest x-ray showed no pneumonia. She was treated with azithromycin and prednisone. CBC was normal with a hemoglobin 13.6, WBC 5.3. Normal BMP and BNP.     Current Medications  Taking  Breo Ellipta(Fluticasone Furoate-Vilanterol) 100-25 MCG/INH Aerosol Powder Breath Activated 1 puff Inhalation Once a day, Notes: PRN Escitalopram Oxalate 5 MG Tablet Take 1 tablet by mouth once daily  HumuLIN R U-500 KwikPen(Insulin Regular Human (Conc)) 500 UNIT/ML Solution Pen-injector 80 units before breakfast and 30 units before evening meal Subcutaneous Twice a day 30 minutes before meals OneTouch Verio(Blood Glucose Test) - Strip check blood sugar In Vitro three times a day DX: E11.65 Ozempic 0.25 or 0.5 MG/DOSE Solution Pen-injector Titrate 0.25 mg once weekly for 4 weeks then 0.5 mg once weekly for 2  weeks Subcutaneous once a week, Notes: starts when trulicity gone Vitamin C 500 MG Capsule 1 capsule Orally Once a day ProAir HFA(Albuterol Sulfate HFA) 90MCG Aerosol Solution 2 puffs as needed Inhalation every 6 hrs Crestor(Rosuvastatin Calcium) 40 MG Tablet 1 tablet Orally Once a day Carvedilol 6.25 MG Tablet 1 tablet Orally twice a day Lisinopril 40 MG Tablet 1 tablet Orally Once a day Furosemide 40 Tablet 1 tablet Orally twice a day Nitroglycerin 0.4 mg 0.4 mg tablet 1 tablet sublingually SL take one and repeat every 5 minutes x 2 if chest pain persists, Notes: as needed Ezetimibe 10 MG Tablet 1 tablet Orally Once a day Klor-Con M10(Potassium Chloride PRT CR) 10 MEQ Tablet Extended Release 1 tablet by mouth Twice a day  Omron 3 Series BP Monitor - Device . . check blood pressure once a week ALPRAZolam 0.5 MG Tablet 1 tablet Orally Twice a day as needed Eliquis(Apixaban) 5 MG Tablet 1 tablet Orally Twice a day Albuterol Sulfate (2.5 MG/3ML) 0.083% Nebulization Solution 3 mL as needed Inhalation every 6 hrs Albuterol Sulfate HFA 108 (90 Base) MCG/ACT Aerosol Solution 1-2 puffs as needed Inhalation every 6 hrs Allegra-D 12 Hour(Fexofenadine-Pseudoephed ER) , Notes: seasonally Symbicort(Budesonide-Formoterol Fumarate) 80-4.5 MCG/ACT Aerosol 2 puffs Inhalation Once a day Flonase Allergy Relief(Fluticasone Propionate) 50 MCG/ACT Suspension 1 spray in each nostril Nasally Once a day, Notes: seasonally Ondansetron 8 MG Tablet Disintegrating 1 tablet on the tongue and allow to dissolve as needed Orally Every 8 hours, Notes: as needed Refresh(Carboxymethylcellulose Sodium)  1.4-0.6 % Solution as directed Ophthalmic , Notes: as needed Pantoprazole Sodium 40 MG Tablet Delayed Release 1 tablet Orally Once a day OneTouch Delica Lancets 22L - Miscellaneous check blood sugar finger stick three times a day DX: E11.65 OneTouch Verio Reflect w/Device Kit check blood sugar InVitro three times a day DX:  E11.65 amLODIPine Besylate 10 MG Tablet 1 tablet Orally Once a day Aspirin 81 MG Tablet Chewable 1 tablet Orally Once a day Vitamin D3 50 MCG (2000 UT) Tablet 1 tablet Orally Once a day, Notes: occasionally Dexilant 60 mg Capsule 1 capsule Orally as needed BD Pen Needle Nano U/F(Insulin Pen Needle) 32G X 4 MM . Marland Kitchen subcutaneous Twice a day    Past Medical History       Type 2 Diabetes Mellitus Dr. Buddy Duty.      Hypertension.      Morbid obesity.      Hyperlipidemia.       Obstructive sleep apnea ? if this present.      coronary disease followed by Dr. Irish Lack.      sleep apnea Does use CPAP.       Gerd / hiatal hernia.       Carpal tunnel.       Hypokalemia.      Peripheral neuropathy.      Kidney stones.      DEXA 11/2010, no thinning present repeat 3 years.      colonoscopy normal 11/24/2010 repeat 10 years, Dr. Penelope Coop.      eye exam, 06/27/2013, no diabetic retinopathy, Dr.Groat.      Stroke 11/2020.     Surgical History        fibroid removal 2006        D&C 1985        achilles tendon repair 1995        Cardiac Stent 2007        colonoscopy 2012        endoscopy 2012        left eye Dr. Zadie Rhine 2019     Family History  Father: deceased, Diabetes Mellitus, Hyperlipidemia, CVA, MI, diagnosed with Diabetes, CVA, Coronary artery disease  Mother: deceased 83 yrs, Diabetes Mellitus, Hypertension, MI, Hyperlipidemia dementia May 2021, diagnosed with Diabetes, Hypertension, Coronary artery disease  Paternal Grand Father: deceased, Hypertension, diagnosed with Hypertension  Paternal Grand Mother: deceased, Hypertension, diagnosed with Hypertension  Maternal Grand Father: deceased, Hypertension, diagnosed with Hypertension  Maternal Grand Mother: deceased, Diabetes Mellitus, Hypertension, diagnosed with Diabetes, Hypertension  Brother 1: deceased 61 yrs, Hypertension, Diabetes Mellitus, Hyperlipidemia, diagnosed with Diabetes, Hypertension  Brother2: alive, No known medical problem,  drug issue ? cad  Sister 1: alive, Hypertension, Diabetes Mellitus breast cancer, diagnosed with Diabetes, Hypertension  Sister 2: alive, Diabetes Mellitus, Hypertension, Stroke [facility], diagnosed with Diabetes, Hypertension  2 brother(s) , 2 sister(s) .   neg GI family hx. no children.     Social History  General:   Tobacco use       cigarettes:  Former smoker     Quit in year  1987     Pack-year Hx:  5     Tobacco history last updated  06/11/2021     Vaping  No EXPOSURE TO PASSIVE SMOKE: father was a smoker/ cigar/pipe/cigs.  no Alcohol, none.  Caffeine: de-caff coffee , occasionally.  no Recreational drug use, never.  DIET: diabetic diet.  Exercise: limited.  DENTAL CARE: over due.  Marital Status: Single.  Children: none.  EDUCATION:  [place].  OCCUPATION: social services, [employer] , administrative.  COMMUNICATION BARRIERS: no hearing, vision or cognition issues.  Religion: yes, [employer], [place] use: yes.  no Tobacco Exposure.      Allergies  Bystolic: syncope  Mobic: palpitations  Sulfa drugs: itching face swells up  Januvia: ?? - Side Effects  Metformin HCl: stomach upset - Side Effects  Invokana 100 MG: yeast infection - Side Effects  Jardiance: yeast infection - Side Effects  Cephalexin: stomach upset - Side Effects    Hospitalization/Major Diagnostic Procedure  x 3 days for reflux and heart issues 12/2010  CVA and then afib post syncopal episode in hospital 11/27/2020 -11/29/2020  ER visit asthma 04/2021  None since 04/2021 06/2021     Review of Systems  GI PROCEDURE:          Pacemaker/ AICD YES.  Artificial heart valves no.  MI/heart attack no.  Abnormal heart rhythm no.  Angina YES.  CVA YES, 11/27/2020.  Hypertension YES.  Hypotension no.  Asthma, COPD YES.  Sleep apnea no.  Seizure disorders no.  Artificial joints no.  Diabetes YES, type II.  Significant headaches no.   Vertigo no.  Depression/anxiety YES.  Abnormal bleeding no , Taking blood thinners, ELIQUIS.  Kidney Disease no.  Liver disease no.  Blood transfusion no.          Vital Signs  Wt 274.2, Wt change -0.4 lbs, Ht 62.25, BMI 49.74, Temp 95.7, Pulse sitting 71, BP sitting 150/73.   Examination  Gastroenterology Exam:        GENERAL APPEARANCE: Well developed, morbidly obese, no active distress, pleasant, no acute distress.         SCLERA: anicteric.         RESPIRATORY Breath sounds clear to auscultation. No wheezes, rales or rhonchi. Respiration even and unlabored.         CARDIOVASCULAR Normal RRR w/o murmers or gallops. No peripheral edema.         ABDOMEN No masses palpated. Liver and spleen not palpated, normal. Bowel sounds normal, Abdomen soft and very obese; Moderate Epigastric Tenderness; Rest of abdomen is not tender.Marland Kitchen         PSYCHIATRIC Alert and oriented x3, mood and affect appear normal. Positive affect. Mild short term memory impairment. answers questions well. Normal speech. Appropriate responses.Marland Kitchen         NEURO: Alert; She is able to get on and off exam table with mild assistance from me. She walks with NO assistive devices..       Assessments     1. Epigastric abdominal pain - R10.13 (Primary)   2. Loose stools - R19.5   3. Colon cancer screening - Z12.11   4. Severe obesity (BMI >= 40) - E66.01, BMI = 49.74   5. Cerebrovascular disease - I67.9, Hx CVA 11/2020 - Mild Short Term Memory Loss   6. Atherosclerotic heart disease of native coronary artery without angina pectoris - I25.10, Stable; Assymptomatic; Followed by Cardiologist Dr. Scarlette Calico     Treatment   1. Epigastric abdominal pain         IMAGING: EGD Esophagogastroduodenoscopy                Notes: I discussed risks of EGD with patient today, including risk of sedation, bleeding or perforation. Patient expressed understanding and agrees to proceed with procedure.      2. Colon cancer screening  IMAGING: Colonoscopy              Notes: I discussed risks and benefits of colonoscopy procedure with patient to include risk of bleeding or colon perforation. Patient expressed understanding and agrees to proceed with procedure.      3. Severe obesity (BMI >= 40)   Notes: Schedule procedures in Hospital due to severe obesity and multiple comorrbidities.      4. Atherosclerotic heart disease of native coronary artery without angina pectoris   Notes: We are requesting cardiac clearance from cardiology for EGD & Colonoscopy procedures.  We are requesting cardiac permission to hold Eliquis 2 days prior to procedure. She will continue 24m Aspirin during procedures. Discussed mild increased risk of bleeding.

## 2021-07-22 ENCOUNTER — Encounter (HOSPITAL_COMMUNITY): Payer: Self-pay | Admitting: Gastroenterology

## 2021-07-22 ENCOUNTER — Ambulatory Visit (HOSPITAL_COMMUNITY): Payer: Medicare Other | Admitting: Certified Registered"

## 2021-07-22 ENCOUNTER — Ambulatory Visit (HOSPITAL_BASED_OUTPATIENT_CLINIC_OR_DEPARTMENT_OTHER): Payer: Medicare Other | Admitting: Certified Registered"

## 2021-07-22 ENCOUNTER — Ambulatory Visit (HOSPITAL_COMMUNITY)
Admission: RE | Admit: 2021-07-22 | Discharge: 2021-07-22 | Disposition: A | Discharge status: Home / Self Care | Payer: Medicare Other | Attending: Gastroenterology | Admitting: Gastroenterology

## 2021-07-22 ENCOUNTER — Other Ambulatory Visit: Payer: Self-pay

## 2021-07-22 ENCOUNTER — Encounter (HOSPITAL_COMMUNITY)
Admission: RE | Disposition: A | Discharge status: Home / Self Care | Payer: Self-pay | Source: Home / Self Care | Attending: Gastroenterology

## 2021-07-22 DIAGNOSIS — K648 Other hemorrhoids: Secondary | ICD-10-CM | POA: Insufficient documentation

## 2021-07-22 DIAGNOSIS — K2950 Unspecified chronic gastritis without bleeding: Secondary | ICD-10-CM | POA: Diagnosis not present

## 2021-07-22 DIAGNOSIS — G4730 Sleep apnea, unspecified: Secondary | ICD-10-CM | POA: Insufficient documentation

## 2021-07-22 DIAGNOSIS — R1013 Epigastric pain: Secondary | ICD-10-CM | POA: Diagnosis present

## 2021-07-22 DIAGNOSIS — Z79899 Other long term (current) drug therapy: Secondary | ICD-10-CM | POA: Insufficient documentation

## 2021-07-22 DIAGNOSIS — Z833 Family history of diabetes mellitus: Secondary | ICD-10-CM | POA: Diagnosis not present

## 2021-07-22 DIAGNOSIS — Z7982 Long term (current) use of aspirin: Secondary | ICD-10-CM | POA: Insufficient documentation

## 2021-07-22 DIAGNOSIS — D123 Benign neoplasm of transverse colon: Secondary | ICD-10-CM | POA: Diagnosis not present

## 2021-07-22 DIAGNOSIS — E1142 Type 2 diabetes mellitus with diabetic polyneuropathy: Secondary | ICD-10-CM | POA: Diagnosis not present

## 2021-07-22 DIAGNOSIS — K449 Diaphragmatic hernia without obstruction or gangrene: Secondary | ICD-10-CM | POA: Diagnosis not present

## 2021-07-22 DIAGNOSIS — Z955 Presence of coronary angioplasty implant and graft: Secondary | ICD-10-CM | POA: Insufficient documentation

## 2021-07-22 DIAGNOSIS — Z6841 Body Mass Index (BMI) 40.0 and over, adult: Secondary | ICD-10-CM | POA: Insufficient documentation

## 2021-07-22 DIAGNOSIS — K644 Residual hemorrhoidal skin tags: Secondary | ICD-10-CM | POA: Diagnosis not present

## 2021-07-22 DIAGNOSIS — D122 Benign neoplasm of ascending colon: Secondary | ICD-10-CM | POA: Insufficient documentation

## 2021-07-22 DIAGNOSIS — Z823 Family history of stroke: Secondary | ICD-10-CM | POA: Insufficient documentation

## 2021-07-22 DIAGNOSIS — K222 Esophageal obstruction: Secondary | ICD-10-CM | POA: Diagnosis not present

## 2021-07-22 DIAGNOSIS — I10 Essential (primary) hypertension: Secondary | ICD-10-CM | POA: Diagnosis not present

## 2021-07-22 DIAGNOSIS — K2980 Duodenitis without bleeding: Secondary | ICD-10-CM | POA: Insufficient documentation

## 2021-07-22 DIAGNOSIS — K259 Gastric ulcer, unspecified as acute or chronic, without hemorrhage or perforation: Secondary | ICD-10-CM | POA: Insufficient documentation

## 2021-07-22 DIAGNOSIS — Z1211 Encounter for screening for malignant neoplasm of colon: Secondary | ICD-10-CM | POA: Diagnosis not present

## 2021-07-22 DIAGNOSIS — K219 Gastro-esophageal reflux disease without esophagitis: Secondary | ICD-10-CM | POA: Insufficient documentation

## 2021-07-22 DIAGNOSIS — Z8673 Personal history of transient ischemic attack (TIA), and cerebral infarction without residual deficits: Secondary | ICD-10-CM | POA: Insufficient documentation

## 2021-07-22 DIAGNOSIS — Z87891 Personal history of nicotine dependence: Secondary | ICD-10-CM | POA: Insufficient documentation

## 2021-07-22 DIAGNOSIS — D128 Benign neoplasm of rectum: Secondary | ICD-10-CM | POA: Diagnosis not present

## 2021-07-22 DIAGNOSIS — Z7985 Long-term (current) use of injectable non-insulin antidiabetic drugs: Secondary | ICD-10-CM | POA: Diagnosis not present

## 2021-07-22 DIAGNOSIS — Z794 Long term (current) use of insulin: Secondary | ICD-10-CM | POA: Insufficient documentation

## 2021-07-22 DIAGNOSIS — K3189 Other diseases of stomach and duodenum: Secondary | ICD-10-CM | POA: Diagnosis not present

## 2021-07-22 DIAGNOSIS — I2510 Atherosclerotic heart disease of native coronary artery without angina pectoris: Secondary | ICD-10-CM | POA: Insufficient documentation

## 2021-07-22 HISTORY — PX: COLONOSCOPY WITH PROPOFOL: SHX5780

## 2021-07-22 HISTORY — PX: BIOPSY: SHX5522

## 2021-07-22 HISTORY — PX: POLYPECTOMY: SHX5525

## 2021-07-22 HISTORY — PX: ESOPHAGOGASTRODUODENOSCOPY (EGD) WITH PROPOFOL: SHX5813

## 2021-07-22 LAB — GLUCOSE, CAPILLARY: Glucose-Capillary: 243 mg/dL — ABNORMAL HIGH (ref 70–99)

## 2021-07-22 SURGERY — COLONOSCOPY WITH PROPOFOL
Anesthesia: Monitor Anesthesia Care

## 2021-07-22 MED ORDER — PROPOFOL 10 MG/ML IV BOLUS
INTRAVENOUS | Status: DC | PRN
  Administered 2021-07-22: 10 mg via INTRAVENOUS
  Administered 2021-07-22: 20 mg via INTRAVENOUS

## 2021-07-22 MED ORDER — LACTATED RINGERS IV SOLN: INTRAVENOUS | Status: DC

## 2021-07-22 MED ORDER — PROPOFOL 500 MG/50ML IV EMUL
INTRAVENOUS | Status: DC | PRN
Start: 2021-07-22 — End: 2021-07-22
  Administered 2021-07-22: 125 ug/kg/min via INTRAVENOUS

## 2021-07-22 MED ORDER — PROPOFOL 10 MG/ML IV BOLUS
INTRAVENOUS | Status: AC
  Filled 2021-07-22: qty 20

## 2021-07-22 MED ORDER — PROPOFOL 1000 MG/100ML IV EMUL
INTRAVENOUS | Status: AC
  Filled 2021-07-22: qty 100

## 2021-07-22 MED ORDER — SODIUM CHLORIDE 0.9 % IV SOLN: INTRAVENOUS | Status: DC

## 2021-07-22 MED ORDER — LIDOCAINE 2% (20 MG/ML) 5 ML SYRINGE
INTRAMUSCULAR | Status: DC | PRN
Start: 2021-07-22 — End: 2021-07-22
  Administered 2021-07-22: 40 mg via INTRAVENOUS

## 2021-07-22 SURGICAL SUPPLY — 25 items

## 2021-07-22 NOTE — Addendum Note (Signed)
Addendum  created 07/22/21 1010 by Eben Burow, CRNA   Flowsheet accepted

## 2021-07-22 NOTE — Op Note (Signed)
[facility] Patient Name: [name] Procedure Date: 07/22/2021 MRN: [id number] Attending MD: Ronnette Juniper , MD Date of Birth: 04-19-1955 CSN: [id number] Age: 66 Admit Type: Outpatient Procedure:                Colonoscopy Indications:              Screening for colorectal malignant neoplasm, Last                            colonoscopy: 2012 Providers:                Ronnette Juniper, MD, Jaci Carrel, RN, William Dalton, Technician, Darliss Cheney, Technician Referring MD:             Rosanne Ashing Medicines:                Monitored Anesthesia Care Complications:            No immediate complications. Estimated blood loss:                            Minimal. Estimated Blood Loss:     Estimated blood loss was minimal. Procedure:                Pre-Anesthesia Assessment:                           - Prior to the procedure, a History and Physical                            was performed, and patient medications and                            allergies were reviewed. The patient's tolerance of                            previous anesthesia was also reviewed. The risks                            and benefits of the procedure and the sedation                            options and risks were discussed with the patient.                            All questions were answered, and informed consent                            was obtained. Prior Anticoagulants: The patient has                            taken Eliquis (apixaban), last dose was 1 day prior                            to  procedure. ASA Grade Assessment: III - A patient                            with severe systemic disease. After reviewing the                            risks and benefits, the patient was deemed in                            satisfactory condition to undergo the procedure.                           - Prior to the procedure, a History and Physical                             was performed, and patient medications and                            allergies were reviewed. The patient's tolerance of                            previous anesthesia was also reviewed. The risks                            and benefits of the procedure and the sedation                            options and risks were discussed with the patient.                            All questions were answered, and informed consent                            was obtained. Prior Anticoagulants: The patient has                            taken Eliquis (apixaban), last dose was 1 day prior                            to procedure. ASA Grade Assessment: III - A patient                            with severe systemic disease. After reviewing the                            risks and benefits, the patient was deemed in                            satisfactory condition to undergo the procedure.                           After obtaining informed consent, the colonoscope  was passed under direct vision. Throughout the                            procedure, the patient's blood pressure, pulse, and                            oxygen saturations were monitored continuously. The                            CF-HQ190L ([id number] Olympus colonoscope was                            introduced through the anus and advanced to the the                            cecum, identified by appendiceal orifice and                            ileocecal valve. The colonoscopy was performed                            without difficulty. The patient tolerated the                            procedure well. The quality of the bowel                            preparation was good. Scope In: 9:14:37 AM Scope Out: 9:27:37 AM Scope Withdrawal Time: 0 hours 11 minutes 2 seconds  Total Procedure Duration: 0 hours 13 minutes 0 seconds  Findings:      Skin tags were found on perianal exam.      Hemorrhoids were found on  perianal exam.      Three sessile polyps were found in the rectum, transverse colon and       ascending colon. The polyps were 4 to 6 mm in size. These polyps were       removed with a piecemeal technique using a cold biopsy forceps.       Resection and retrieval were complete.      Non-bleeding internal hemorrhoids were found during retroflexion. Impression:               - Perianal skin tags found on perianal exam.                           - Hemorrhoids found on perianal exam.                           - Three 4 to 6 mm polyps in the rectum, in the                            transverse colon and in the ascending colon,                            removed piecemeal using a cold biopsy forceps.  Resected and retrieved.                           - Non-bleeding internal hemorrhoids. Moderate Sedation:      Patient did not receive moderate sedation for this procedure, but       instead received monitored anesthesia care. Recommendation:           - Patient has a contact number available for                            emergencies. The signs and symptoms of potential                            delayed complications were discussed with the                            patient. Return to normal activities tomorrow.                            Written discharge instructions were provided to the                            patient.                           - Resume regular diet.                           - Continue present medications.                           - Await pathology results.                           - Repeat colonoscopy for surveillance based on                            pathology results.                           - Resume Eliquis (apixaban) at prior dose tomorrow. Procedure Code(s):        --- Professional ---                           [id number], Colonoscopy, flexible; with biopsy, single                            or multiple Diagnosis Code(s):        ---  Professional ---                           Z12.11, Encounter for screening for malignant                            neoplasm of colon  K62.1, Rectal polyp                           K63.5, Polyp of colon                           K64.4, Residual hemorrhoidal skin tags                           K64.8, Other hemorrhoids CPT copyright 2019 [employer]. All rights reserved. The codes documented in this report are preliminary and upon coder review may  be revised to meet current compliance requirements. Ronnette Juniper, MD 07/22/2021 9:36:22 AM This report has been signed electronically. Number of Addenda: 0

## 2021-07-22 NOTE — Discharge Instructions (Signed)

## 2021-07-22 NOTE — Interval H&P Note (Signed)
History and Physical Interval Note: 66/female with morbid obesity, last dose of eliquis on 07/20/21,with epigastric pain, loose stools and last colon in 2012 for EGD and colonoscopy with propofol.  07/22/2021 8:42 AM  [name]  has presented today for EGD and colonoscopy, with the diagnosis of epigastric pain ,loose stools and Colon cancer screening.  The various methods of treatment have been discussed with the patient and family. After consideration of risks, benefits and other options for treatment, the patient has consented to  Procedure(s): COLONOSCOPY WITH PROPOFOL (N/A) ESOPHAGOGASTRODUODENOSCOPY (EGD) WITH PROPOFOL (N/A) as a surgical intervention.  The patient's history has been reviewed, patient examined, no change in status, stable for surgery.  I have reviewed the patient's chart and labs.  Questions were answered to the patient's satisfaction.     Ronnette Juniper

## 2021-07-22 NOTE — Op Note (Signed)
[facility] Patient Name: [name] Procedure Date: 07/22/2021 MRN: [id number] Attending MD: Ronnette Juniper , MD Date of Birth: [birth year] CSN: [id number] Age: 66 Admit Type: Outpatient Procedure:                Upper GI endoscopy Indications:              Epigastric abdominal pain, loose stools Providers:                Ronnette Juniper, MD, Jaci Carrel, RN, William Dalton, Technician, Darliss Cheney, Technician Referring MD:             Rosanne Ashing Medicines:                Monitored Anesthesia Care Complications:            No immediate complications. Estimated blood loss:                            Minimal. Estimated Blood Loss:     Estimated blood loss was minimal. Procedure:                Pre-Anesthesia Assessment:                           - Prior to the procedure, a History and Physical                            was performed, and patient medications and                            allergies were reviewed. The patient's tolerance of                            previous anesthesia was also reviewed. The risks                            and benefits of the procedure and the sedation                            options and risks were discussed with the patient.                            All questions were answered, and informed consent                            was obtained. Prior Anticoagulants: The patient has                            taken Eliquis (apixaban), last dose was 1 day prior                            to procedure. ASA Grade Assessment: III - A patient  with severe systemic disease. After reviewing the                            risks and benefits, the patient was deemed in                            satisfactory condition to undergo the procedure.                           After obtaining informed consent, the endoscope was                            passed under direct vision. Throughout the                             procedure, the patient's blood pressure, pulse, and                            oxygen saturations were monitored continuously. The                            [id number]-[id number] ([id number]) Olympus endoscope was introduced                            through the mouth, and advanced to the second part                            of duodenum. The upper GI endoscopy was                            accomplished without difficulty. The patient                            tolerated the procedure well. Scope In: Scope Out: Findings:      The upper third of the esophagus, middle third of the esophagus and       lower third of the esophagus were normal.      A widely patent Schatzki ring was found at the gastroesophageal junction.      A 2 cm hiatal hernia was present.      Diffuse mildly erythematous mucosa without bleeding was found in the       gastric body and in the gastric antrum. Biopsies were taken with a cold       forceps for Helicobacter pylori testing.      The cardia and gastric fundus were normal on retroflexion.      Diffuse mildly erythematous mucosa without active bleeding and with no       stigmata of bleeding was found in the duodenal bulb. Biopsies for       histology were taken with a cold forceps for evaluation of celiac       disease. Impression:               - Normal upper third of esophagus, middle third of  esophagus and lower third of esophagus.                           - Widely patent Schatzki ring.                           - 2 cm hiatal hernia.                           - Gastric ulcer. Biopsied.                           - Erythematous mucosa in the gastric body and                            antrum. Biopsied.                           - Erythematous duodenopathy. Biopsied. Moderate Sedation:      Patient did not receive moderate sedation for this procedure, but       instead received monitored anesthesia care. Recommendation:            - Patient has a contact number available for                            emergencies. The signs and symptoms of potential                            delayed complications were discussed with the                            patient. Return to normal activities tomorrow.                            Written discharge instructions were provided to the                            patient.                           - Resume regular diet.                           - Continue present medications.                           - Await pathology results.                           - Use Protonix (pantoprazole) 40 mg PO BID for 1                            month. Procedure Code(s):        --- Professional ---                           [phone], Esophagogastroduodenoscopy, flexible,  transoral; with biopsy, single or multiple Diagnosis Code(s):        --- Professional ---                           K22.2, Esophageal obstruction                           K44.9, Diaphragmatic hernia without obstruction or                            gangrene                           K31.89, Other diseases of stomach and duodenum                           R10.13, Epigastric pain CPT copyright 2019 American [employer] Association. All rights reserved. The codes documented in this report are preliminary and upon coder review may  be revised to meet current compliance requirements. Ronnette Juniper, MD 07/22/2021 9:33:27 AM This report has been signed electronically. Number of Addenda: 0

## 2021-07-22 NOTE — Anesthesia Postprocedure Evaluation (Signed)
Anesthesia Post Note  Patient: [name]  Procedure(s) Performed: COLONOSCOPY WITH PROPOFOL ESOPHAGOGASTRODUODENOSCOPY (EGD) WITH PROPOFOL BIOPSY POLYPECTOMY     Patient location during evaluation: PACU Anesthesia Type: MAC Level of consciousness: awake and alert Pain management: pain level controlled Vital Signs Assessment: post-procedure vital signs reviewed and stable Respiratory status: spontaneous breathing, nonlabored ventilation, respiratory function stable and patient connected to nasal cannula oxygen Cardiovascular status: stable and blood pressure returned to baseline Postop Assessment: no apparent nausea or vomiting Anesthetic complications: no   No notable events documented.  Last Vitals:  Vitals:   07/22/21 0940 07/22/21 0950  BP: 109/60 (!) 153/83  Pulse: 99 (!) 103  Resp: 20 (!) 21  Temp: 36.6 C 36.5 C  SpO2: 100% 95%    Last Pain:  Vitals:   07/22/21 0950  TempSrc: Temporal  PainSc: 0-No pain                 Marlos Carmen S

## 2021-07-22 NOTE — Anesthesia Procedure Notes (Signed)
Procedure Name: MAC Date/Time: 07/22/2021 9:00 AM  Performed by: Niel Hummer, CRNAPre-anesthesia Checklist: Patient identified, Emergency Drugs available, Suction available and Patient being monitored Oxygen Delivery Method: Simple face mask

## 2021-07-22 NOTE — Transfer of Care (Signed)
Immediate Anesthesia Transfer of Care Note  Patient: [name]  Procedure(s) Performed: COLONOSCOPY WITH PROPOFOL ESOPHAGOGASTRODUODENOSCOPY (EGD) WITH PROPOFOL BIOPSY POLYPECTOMY  Patient Location: PACU and Endoscopy Unit  Anesthesia Type:MAC  Level of Consciousness: awake, alert  and patient cooperative  Airway & Oxygen Therapy: Patient Spontanous Breathing and Patient connected to face mask oxygen  Post-op Assessment: Report given to RN and Post -op Vital signs reviewed and stable  Post vital signs: Reviewed and stable  Last Vitals:  Vitals Value Taken Time  BP    Temp    Pulse 97 07/22/21 0937  Resp 19 07/22/21 0937  SpO2 100 % 07/22/21 0937  Vitals shown include unvalidated device data.  Last Pain:  Vitals:   07/22/21 0828  TempSrc: Temporal  PainSc: 0-No pain         Complications: No notable events documented.

## 2021-07-22 NOTE — Anesthesia Preprocedure Evaluation (Signed)
Anesthesia Evaluation  Patient identified by MRN, date of birth, ID band Patient awake    Reviewed: Allergy & Precautions, NPO status , Patient's Chart, lab work & pertinent test results  Airway Mallampati: III  TM Distance: <3 FB Neck ROM: Full    Dental no notable dental hx.    Pulmonary sleep apnea , former smoker,    Pulmonary exam normal breath sounds clear to auscultation       Cardiovascular hypertension, Pt. on medications Normal cardiovascular exam Rhythm:Regular Rate:Normal     Neuro/Psych CVA negative psych ROS   GI/Hepatic Neg liver ROS, GERD  ,  Endo/Other  diabetes  Renal/GU negative Renal ROS  negative genitourinary   Musculoskeletal negative musculoskeletal ROS (+)   Abdominal   Peds negative pediatric ROS (+)  Hematology negative hematology ROS (+)   Anesthesia Other Findings   Reproductive/Obstetrics negative OB ROS                             Anesthesia Physical Anesthesia Plan  ASA: 3  Anesthesia Plan: MAC   Post-op Pain Management: Minimal or no pain anticipated   Induction: Intravenous  PONV Risk Score and Plan: 2 and Propofol infusion and Treatment may vary due to age or medical condition  Airway Management Planned: Simple Face Mask  Additional Equipment:   Intra-op Plan:   Post-operative Plan:   Informed Consent: I have reviewed the patients History and Physical, chart, labs and discussed the procedure including the risks, benefits and alternatives for the proposed anesthesia with the patient or authorized representative who has indicated his/her understanding and acceptance.     Dental advisory given  Plan Discussed with: CRNA and Surgeon  Anesthesia Plan Comments:         Anesthesia Quick Evaluation

## 2021-07-23 LAB — SURGICAL PATHOLOGY

## 2021-07-25 ENCOUNTER — Telehealth: Payer: Self-pay | Admitting: Interventional Cardiology

## 2021-07-25 ENCOUNTER — Other Ambulatory Visit: Payer: Self-pay | Admitting: Interventional Cardiology

## 2021-07-25 NOTE — Telephone Encounter (Signed)
*  STAT* If patient is at the pharmacy, call can be transferred to refill team.   1. Which medications need to be refilled? (please list name of each medication and dose if known)   carvedilol (COREG) 6.25 MG tablet   2. Which pharmacy/location (including street and city if local pharmacy) is medication to be sent to? [place], [place] [address]  3. Do they need a 30 day or 90 day supply? 90  Pt is completely out of medication

## 2021-07-27 ENCOUNTER — Encounter (HOSPITAL_COMMUNITY): Payer: Self-pay | Admitting: Gastroenterology

## 2021-07-28 ENCOUNTER — Other Ambulatory Visit: Payer: Self-pay

## 2021-09-08 ENCOUNTER — Other Ambulatory Visit: Payer: Self-pay | Admitting: Interventional Cardiology

## 2021-09-08 NOTE — Telephone Encounter (Signed)
Prescription refill request for Eliquis received. Indication: PAF Last office visit: 06/17/21  Janan Ridge PA Scr:  0.79 on 04/17/21 Age:  66 Weight:126.6kg  Based on above findings Eliquis '5mg'$  twice daily is the appropriate dose.  Refill approved.
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
## 2021-10-03 ENCOUNTER — Ambulatory Visit (INDEPENDENT_AMBULATORY_CARE_PROVIDER_SITE_OTHER): Payer: Medicare Other | Admitting: Interventional Cardiology

## 2021-10-03 ENCOUNTER — Encounter: Payer: Self-pay | Admitting: Interventional Cardiology

## 2021-10-03 VITALS — BP 140/80 | HR 70 | Ht 62.5 in | Wt 287.0 lb

## 2021-10-03 DIAGNOSIS — I2510 Atherosclerotic heart disease of native coronary artery without angina pectoris: Secondary | ICD-10-CM

## 2021-10-03 DIAGNOSIS — E785 Hyperlipidemia, unspecified: Secondary | ICD-10-CM | POA: Diagnosis not present

## 2021-10-03 DIAGNOSIS — I480 Paroxysmal atrial fibrillation: Secondary | ICD-10-CM

## 2021-10-03 DIAGNOSIS — I10 Essential (primary) hypertension: Secondary | ICD-10-CM

## 2021-10-03 NOTE — Patient Instructions (Addendum)
Medication Instructions:  Your physician recommends that you continue on your current medications as directed. Please refer to the Current Medication list given to you today.  *If you need a refill on your cardiac medications before your next appointment, please call your pharmacy*   Lab Work: none If you have labs (blood work) drawn today and your tests are completely normal, you will receive your results only by: [place] (if you have MyChart) OR A paper copy in the mail If you have any lab test that is abnormal or we need to change your treatment, we will call you to review the results.   Testing/Procedures: none   Follow-Up: At [facility], you and your health needs are our priority.  As part of our continuing mission to provide you with exceptional heart care, we have created designated Provider Care Teams.  These Care Teams include your primary Cardiologist (physician) and Advanced Practice Providers (APPs -  Physician Assistants and Nurse Practitioners) who all work together to provide you with the care you need, when you need it.  We recommend signing up for the patient portal called "MyChart".  Sign up information is provided on this After Visit Summary.  MyChart is used to connect with patients for Virtual Visits (Telemedicine).  Patients are able to view lab/test results, encounter notes, upcoming appointments, etc.  Non-urgent messages can be sent to your provider as well.   To learn more about what you can do with MyChart, go to [email or web address].    Your next appointment:  Apr 08, 2022 at 1:20   The format for your next appointment:   In Person  Provider:   Larae Grooms, MD     Other Instructions    Important Information About Sugar

## 2021-11-02 ENCOUNTER — Ambulatory Visit
Admission: EM | Admit: 2021-11-02 | Discharge: 2021-11-02 | Disposition: A | Discharge status: Home / Self Care | Payer: Medicare Other | Attending: Physician Assistant | Admitting: Physician Assistant

## 2021-11-02 DIAGNOSIS — Z1152 Encounter for screening for COVID-19: Secondary | ICD-10-CM | POA: Insufficient documentation

## 2021-11-02 DIAGNOSIS — J45901 Unspecified asthma with (acute) exacerbation: Secondary | ICD-10-CM | POA: Insufficient documentation

## 2021-11-02 MED ORDER — AZITHROMYCIN 250 MG PO TABS: 250.0000 mg | ORAL_TABLET | Freq: Every day | ORAL | 0 refills | Status: DC

## 2021-11-02 MED ORDER — PREDNISONE 20 MG PO TABS: 40.0000 mg | ORAL_TABLET | Freq: Every day | ORAL | 0 refills | Status: AC

## 2021-11-02 MED ORDER — BENZONATATE 100 MG PO CAPS: 100.0000 mg | ORAL_CAPSULE | Freq: Three times a day (TID) | ORAL | 0 refills | Status: DC | PRN

## 2021-11-02 NOTE — ED Provider Notes (Signed)
[facility]    CSN: [id number] Arrival date & time: 11/02/21  1518      History   Chief Complaint Chief Complaint  Patient presents with   Shortness of Breath    Sob, wheezing, itching of ears and irritation of throat. X2-3 days    HPI [name] is a 66 y.o. female.   Patient here today for evaluation of sore throat, cough, shortness of breath and nasal congestion that started 2-3 days ago. She reports that she will have flares of her asthma on occasion and this feels similar. She does not report fever. She has been using her inhalers without resolution of symptoms.  The history is provided by the patient.    Past Medical History:  Diagnosis Date   Arthritis    Asthma    Coronary artery disease    Diabetes mellitus    Diabetic neuropathy (HCC)    GERD (gastroesophageal reflux disease)    History of hiatal hernia    Hx of cardiovascular stress test    Lexiscan Myoview (10/15):  Medium area of ischemia in AL and IL distribution, cannot completely exclude shifting breast attenuation, EF 64%; Abnormal study   Hyperlipidemia    Hypertension    Mild carotid artery disease (HCC)    Morbid obesity (HCC)    PAF (paroxysmal atrial fibrillation) ([place])    Sleep apnea    does not wear CPAP   Stroke (cerebrum) ([place])    Syncope     Patient Active Problem List   Diagnosis Date Noted   Paroxysmal atrial fibrillation with RVR ([place]) 11/28/2020   Hypertensive urgency 11/28/2020   Syncope and collapse 11/28/2020   Nausea 11/28/2020   Hypokalemia 11/28/2020   Hyperglycemia due to diabetes mellitus ([place]) 11/28/2020   GERD (gastroesophageal reflux disease) 11/28/2020   Asthma 11/28/2020   Coronary atherosclerosis of native coronary artery 07/17/2013   Mixed hyperlipidemia 07/17/2013   Essential hypertension, benign 07/17/2013   Obesity 08/11/2011    Past Surgical History:  Procedure Laterality Date   [place]   right   BIOPSY   07/22/2021   Procedure: BIOPSY;  Surgeon: Ronnette Juniper, MD;  [facility];  Service: Gastroenterology;;   COLONOSCOPY     COLONOSCOPY WITH PROPOFOL N/A 07/22/2021   Procedure: COLONOSCOPY WITH PROPOFOL;  Surgeon: Ronnette Juniper, MD;  [facility];  Service: Gastroenterology;  Laterality: N/A;   DILATION AND CURETTAGE OF UTERUS     ESOPHAGOGASTRODUODENOSCOPY (EGD) WITH PROPOFOL N/A 07/22/2021   Procedure: ESOPHAGOGASTRODUODENOSCOPY (EGD) WITH PROPOFOL;  Surgeon: Ronnette Juniper, MD;  [facility];  Service: Gastroenterology;  Laterality: N/A;   HAND SURGERY     right   heart stent  2007   PARS PLANA VITRECTOMY Left 03/24/2017   Procedure: PARS PLANA VITRECTOMY WITH 25 GAUGE;  Surgeon: Hurman Horn, MD;  Location: [facility];  Service: Ophthalmology;  Laterality: Left;   POLYPECTOMY  07/22/2021   Procedure: POLYPECTOMY;  Surgeon: Ronnette Juniper, MD;  [facility];  Service: Gastroenterology;;   UTERINE FIBROID SURGERY  2008    OB History   No obstetric history on file.      Home Medications    Prior to Admission medications   Medication Sig Start Date End Date Taking? Authorizing Provider  acetaminophen (TYLENOL) 325 MG tablet Take 650 mg by mouth as needed for mild pain or moderate pain.   Yes [provider]  albuterol (VENTOLIN HFA) 108 (90 Base) MCG/ACT inhaler Inhale  1-2 puffs into the lungs every 6 (six) hours as needed for wheezing or shortness of breath. 04/16/21  Yes Jaynee Eagles, PA-C  ALPRAZolam Duanne Moron) 0.5 MG tablet Take 0.25-0.5 mg by mouth 2 (two) times daily as needed for anxiety or sleep. 11/21/20  Yes [provider]  amLODipine (NORVASC) 10 MG tablet Take 1 tablet (10 mg total) by mouth daily. 01/20/21  Yes Jettie Booze, MD  apixaban Arne Cleveland) 5 MG TABS tablet Take 1 tablet by mouth twice daily 09/08/21  Yes Jettie Booze, MD  aspirin EC 81 MG tablet Take 1 tablet (81 mg total) by mouth daily. 03/03/19  Yes Jettie Booze, MD  azithromycin (ZITHROMAX) 250 MG tablet Take 1 tablet (250 mg total) by mouth daily. Take first 2 tablets together, then 1 every day until finished. 11/02/21  Yes Francene Finders, PA-C  b complex vitamins capsule Take 1 capsule by mouth daily.   Yes [provider]  BREO ELLIPTA 100-25 MCG/INH AEPB Inhale 1 puff into the lungs daily. 09/21/16  Yes [provider]  carvedilol (COREG) 6.25 MG tablet Take 1 tablet by mouth twice daily 07/25/21  Yes Jettie Booze, MD  Cholecalciferol (VITAMIN D-3 PO) Take 2,000 Units by mouth daily.   Yes [provider]  escitalopram (LEXAPRO) 5 MG tablet Take 5 mg by mouth at bedtime. 11/22/20  Yes [provider]  FREESTYLE LITE test strip 1 each by Other route as directed.  07/08/11  Yes [provider]  furosemide (LASIX) 40 MG tablet Take 2 tablets by mouth once daily 03/10/21  Yes Jettie Booze, MD  HUMULIN R 500 UNIT/ML SOLN injection Inject 30-80 Units into the skin See admin instructions. 80 units in the morning and 30 evening.  Sliding Scale. 70 and below do not take 12/25/13  Yes [provider]  lisinopril (ZESTRIL) 40 MG tablet Take 1 tablet (40 mg total) by mouth daily. 01/20/21  Yes Jettie Booze, MD  nitroGLYCERIN (NITROSTAT) 0.4 MG SL tablet Place 1 tablet (0.4 mg total) under the tongue every 5 (five) minutes as needed for chest pain (X 3 DOSES FOR CHEST PAIN). 11/30/17  Yes Jettie Booze, MD  ondansetron (ZOFRAN-ODT) 8 MG disintegrating tablet Take 1 tablet (8 mg total) by mouth every 8 (eight) hours as needed for nausea or vomiting. 05/12/20  Yes Scot Jun, FNP  OZEMPIC, 0.25 OR 0.5 MG/DOSE, 2 MG/1.5ML SOPN Inject 0.25 mg into the skin once a week. 04/02/21  Yes [provider]  pantoprazole (PROTONIX) 40 MG tablet Take 40 mg by mouth daily as needed (Heartburn).   Yes [provider]  potassium chloride (KLOR-CON 10) 10 MEQ tablet  Take 1 tablet (10 mEq total) by mouth in the morning and at bedtime. 02/26/21  Yes Jettie Booze, MD  predniSONE (DELTASONE) 20 MG tablet Take 2 tablets (40 mg total) by mouth daily with breakfast for 5 days. 11/02/21 11/07/21 Yes Francene Finders, PA-C  promethazine-dextromethorphan (PROMETHAZINE-DM) 6.25-15 MG/5ML syrup Take 5 mLs by mouth at bedtime as needed for cough. 04/16/21  Yes Jaynee Eagles, PA-C  rosuvastatin (CRESTOR) 40 MG tablet Take 1 tablet by mouth once daily 03/10/21  Yes Jettie Booze, MD  benzonatate (TESSALON) 100 MG capsule Take 1-2 capsules (100-200 mg total) by mouth 3 (three) times daily as needed for cough. 11/02/21   Francene Finders, PA-C  ezetimibe (ZETIA) 10 MG tablet Take 1 tablet (10 mg total) by mouth daily.  06/16/21 10/03/21  Leanor Kail, PA    Family History Family History  Problem Relation Age of Onset   Kidney disease Father    Stroke Father    Heart attack Father    Stroke Maternal Grandfather    Stroke Paternal Grandfather    Asthma Other    Hypertension Other    Diabetes Other     Social History Social History   Tobacco Use   Smoking status: Former    Types: Cigarettes    Quit date: 08/07/1982    Years since quitting: 39.2   Smokeless tobacco: Never  Vaping Use   Vaping Use: Never used  Substance Use Topics   Alcohol use: No   Drug use: No     Allergies   Bystolic [nebivolol hcl], Crestor [rosuvastatin], Sulfa antibiotics, Canagliflozin, Cephalexin, Empagliflozin, Meloxicam, Metformin and related, Metformin hcl, Nebivolol hcl, Other, and Sitagliptin   Review of Systems Review of Systems  Constitutional:  Negative for chills and fever.  HENT:  Positive for congestion and sore throat. Negative for ear pain.   Eyes:  Negative for discharge and redness.  Respiratory:  Positive for cough and shortness of breath. Negative for wheezing.   Gastrointestinal:  Positive for diarrhea. Negative for abdominal pain, nausea and  vomiting.     Physical Exam Triage Vital Signs ED Triage Vitals  Enc Vitals Group     BP      Pulse      Resp      Temp      Temp src      SpO2      Weight      Height      Head Circumference      Peak Flow      Pain Score      Pain Loc      Pain Edu?      Excl. in [place]?    No data found.  Updated Vital Signs BP 135/82 (BP Location: Right Arm)   Pulse 92   Temp 98.9 F (37.2 C) (Oral)   Resp 18   Ht 5' 2.5" (1.588 m)   Wt 275 lb (124.7 kg)   LMP  (LMP Unknown)   SpO2 94%   BMI 49.50 kg/m      Physical Exam Vitals and nursing note reviewed.  Constitutional:      General: She is not in acute distress.    Appearance: Normal appearance. She is not ill-appearing.  HENT:     Head: Normocephalic and atraumatic.     Nose: Congestion present.     Mouth/Throat:     Mouth: Mucous membranes are moist.     Pharynx: No oropharyngeal exudate or posterior oropharyngeal erythema.  Eyes:     Conjunctiva/sclera: Conjunctivae normal.  Cardiovascular:     Rate and Rhythm: Normal rate and regular rhythm.     Heart sounds: Normal heart sounds. No murmur heard. Pulmonary:     Effort: Pulmonary effort is normal. No respiratory distress.     Breath sounds: Normal breath sounds. No wheezing, rhonchi or rales.  Skin:    General: Skin is warm and dry.  Neurological:     Mental Status: She is alert.  Psychiatric:        Mood and Affect: Mood normal.        Thought Content: Thought content normal.      UC Treatments / Results  Labs (all labs ordered are listed, but only abnormal results are displayed) Labs Reviewed  RESP PANEL BY RT-PCR (FLU A&B, COVID) [id number]    EKG   Radiology No results found.  Procedures Procedures (including critical care time)  Medications Ordered in UC Medications - No data to display  Initial Impression / Assessment and Plan / UC Course  I have reviewed the triage vital signs and the nursing notes.  Pertinent labs & imaging results  that were available during my care of the patient were reviewed by me and considered in my medical decision making (see chart for details).    Will treat to cover asthma exacerbation, screen for covid and flu as well. Encouraged follow up if no gradual improvement or with any further concerns.   Final Clinical Impressions(s) / UC Diagnoses   Final diagnoses:  Asthma with acute exacerbation, unspecified asthma severity, unspecified whether persistent  Encounter for screening for COVID-19   Discharge Instructions   None    ED Prescriptions     Medication Sig Dispense Auth. Provider   azithromycin (ZITHROMAX) 250 MG tablet Take 1 tablet (250 mg total) by mouth daily. Take first 2 tablets together, then 1 every day until finished. 6 tablet Ewell Poe F, PA-C   predniSONE (DELTASONE) 20 MG tablet Take 2 tablets (40 mg total) by mouth daily with breakfast for 5 days. 10 tablet Ewell Poe F, PA-C   benzonatate (TESSALON) 100 MG capsule Take 1-2 capsules (100-200 mg total) by mouth 3 (three) times daily as needed for cough. 30 capsule Francene Finders, PA-C      PDMP not reviewed this encounter.   Francene Finders, PA-C 11/03/21 [phone]

## 2021-11-02 NOTE — ED Triage Notes (Signed)
Pt states that she has some sob, sore throat, itching of ears and nasal congestion. X2-3 days

## 2021-11-03 ENCOUNTER — Telehealth: Payer: Self-pay

## 2021-11-03 ENCOUNTER — Encounter: Payer: Self-pay | Admitting: Physician Assistant

## 2021-11-03 LAB — RESP PANEL BY RT-PCR (FLU A&B, COVID) ARPGX2
Influenza A by PCR: NEGATIVE
Influenza B by PCR: NEGATIVE
SARS Coronavirus 2 by RT PCR: NEGATIVE

## 2021-11-03 MED ORDER — FLUCONAZOLE 150 MG PO TABS: ORAL_TABLET | ORAL | 0 refills | Status: DC

## 2021-12-17 ENCOUNTER — Other Ambulatory Visit: Payer: Self-pay | Admitting: Physician Assistant

## 2021-12-21 ENCOUNTER — Emergency Department (HOSPITAL_COMMUNITY): Payer: Medicare Other

## 2021-12-21 ENCOUNTER — Encounter (HOSPITAL_COMMUNITY): Payer: Self-pay | Admitting: *Deleted

## 2021-12-21 ENCOUNTER — Emergency Department (HOSPITAL_COMMUNITY)
Admission: EM | Admit: 2021-12-21 | Discharge: 2021-12-21 | Disposition: A | Discharge status: Home / Self Care | Payer: Medicare Other | Attending: Emergency Medicine | Admitting: Emergency Medicine

## 2021-12-21 ENCOUNTER — Other Ambulatory Visit: Payer: Self-pay

## 2021-12-21 DIAGNOSIS — Z7951 Long term (current) use of inhaled steroids: Secondary | ICD-10-CM | POA: Insufficient documentation

## 2021-12-21 DIAGNOSIS — I10 Essential (primary) hypertension: Secondary | ICD-10-CM | POA: Diagnosis not present

## 2021-12-21 DIAGNOSIS — Z7901 Long term (current) use of anticoagulants: Secondary | ICD-10-CM | POA: Insufficient documentation

## 2021-12-21 DIAGNOSIS — Z79899 Other long term (current) drug therapy: Secondary | ICD-10-CM | POA: Diagnosis not present

## 2021-12-21 DIAGNOSIS — Z7982 Long term (current) use of aspirin: Secondary | ICD-10-CM | POA: Diagnosis not present

## 2021-12-21 DIAGNOSIS — Z794 Long term (current) use of insulin: Secondary | ICD-10-CM | POA: Diagnosis not present

## 2021-12-21 DIAGNOSIS — I2510 Atherosclerotic heart disease of native coronary artery without angina pectoris: Secondary | ICD-10-CM | POA: Insufficient documentation

## 2021-12-21 DIAGNOSIS — M542 Cervicalgia: Secondary | ICD-10-CM | POA: Diagnosis present

## 2021-12-21 DIAGNOSIS — I4891 Unspecified atrial fibrillation: Secondary | ICD-10-CM | POA: Diagnosis not present

## 2021-12-21 DIAGNOSIS — Z8673 Personal history of transient ischemic attack (TIA), and cerebral infarction without residual deficits: Secondary | ICD-10-CM | POA: Diagnosis not present

## 2021-12-21 DIAGNOSIS — Z7985 Long-term (current) use of injectable non-insulin antidiabetic drugs: Secondary | ICD-10-CM | POA: Diagnosis not present

## 2021-12-21 DIAGNOSIS — E119 Type 2 diabetes mellitus without complications: Secondary | ICD-10-CM | POA: Insufficient documentation

## 2021-12-21 DIAGNOSIS — S161XXA Strain of muscle, fascia and tendon at neck level, initial encounter: Secondary | ICD-10-CM

## 2021-12-21 DIAGNOSIS — R42 Dizziness and giddiness: Secondary | ICD-10-CM | POA: Diagnosis not present

## 2021-12-21 LAB — CBC WITH DIFFERENTIAL/PLATELET
Abs Immature Granulocytes: 0.01 10*3/uL (ref 0.00–0.07)
Basophils Absolute: 0 10*3/uL (ref 0.0–0.1)
Basophils Relative: 0 %
Eosinophils Absolute: 0.1 10*3/uL (ref 0.0–0.5)
Eosinophils Relative: 1 %
HCT: 39.6 % (ref 36.0–46.0)
Hemoglobin: 13.7 g/dL (ref 12.0–15.0)
Immature Granulocytes: 0 %
Lymphocytes Relative: 31 %
Lymphs Abs: 2.6 10*3/uL (ref 0.7–4.0)
MCH: 27.4 pg (ref 26.0–34.0)
MCHC: 34.6 g/dL (ref 30.0–36.0)
MCV: 79.2 fL — ABNORMAL LOW (ref 80.0–100.0)
Monocytes Absolute: 0.6 10*3/uL (ref 0.1–1.0)
Monocytes Relative: 7 %
Neutro Abs: 5 10*3/uL (ref 1.7–7.7)
Neutrophils Relative %: 61 %
Platelets: 304 10*3/uL (ref 150–400)
RBC: 5 MIL/uL (ref 3.87–5.11)
RDW: 13.8 % (ref 11.5–15.5)
WBC: 8.4 10*3/uL (ref 4.0–10.5)
nRBC: 0 % (ref 0.0–0.2)

## 2021-12-21 LAB — BASIC METABOLIC PANEL
Anion gap: 9 (ref 5–15)
BUN: 11 mg/dL (ref 8–23)
CO2: 27 mmol/L (ref 22–32)
Calcium: 9.5 mg/dL (ref 8.9–10.3)
Chloride: 103 mmol/L (ref 98–111)
Creatinine, Ser: 0.69 mg/dL (ref 0.44–1.00)
GFR, Estimated: >60 mL/min (ref >60)
Glucose, Bld: 75 mg/dL (ref 70–99)
Potassium: 3.6 mmol/L (ref 3.5–5.1)
Sodium: 139 mmol/L (ref 135–145)

## 2021-12-21 MED ORDER — IOHEXOL 350 MG/ML SOLN
75.0000 mL | Freq: Once | INTRAVENOUS | Status: AC | PRN
Start: 2021-12-21 — End: 2021-12-21
  Administered 2021-12-21: 75 mL via INTRAVENOUS

## 2021-12-21 MED ORDER — MORPHINE SULFATE (PF) 4 MG/ML IV SOLN
4.0000 mg | Freq: Once | INTRAVENOUS | Status: AC
  Administered 2021-12-21: 4 mg via INTRAVENOUS
  Filled 2021-12-21: qty 1

## 2021-12-21 MED ORDER — ACETAMINOPHEN 500 MG PO TABS: 1000.0000 mg | ORAL_TABLET | Freq: Once | ORAL | Status: DC

## 2021-12-21 NOTE — ED Triage Notes (Signed)
Pt states she started having some left side neck pain x 3 days ago that has gotten progressively worse and is now on both sides of her neck radiating to her head and down her chest  Pt states she woke up this am diaphoretic and nauseous and is having some spells of dizziness

## 2021-12-21 NOTE — Discharge Instructions (Signed)
You have been seen today for your complaint of neck pain and dizziness. Your lab work was reassuring and showed no abnormalities. Your imaging was reassuring and showed no abnormalities. Your discharge medications include Tylenol for pain.  You may take up to 1000 mg of Tylenol at a time.  He may take this every 8 hours. Home care instructions are as follows:  Perform the rehab exercises as listed in this packet Follow up with: Primary care provider in the next 7 days Please seek immediate medical care if you develop any of the following symptoms: You vomit or have diarrhea and are unable to eat or drink anything. You have problems talking, walking, swallowing, or using your arms, hands, or legs. You feel generally weak. You have any bleeding. You are not thinking clearly or you have trouble forming sentences. It may take a friend or family member to notice this. You have chest pain, abdominal pain, shortness of breath, or sweating. Your vision changes or you develop a severe headache. At this time there does not appear to be the presence of an emergent medical condition, however there is always the potential for conditions to change. Please read and follow the below instructions.  Do not take your medicine if  develop an itchy rash, swelling in your mouth or lips, or difficulty breathing; call 911 and seek immediate emergency medical attention if this occurs.  You may review your lab tests and imaging results in their entirety on your MyChart account.  Please discuss all results of fully with your primary care provider and other specialist at your follow-up visit.  Note: Portions of this text may have been transcribed using voice recognition software. Every effort was made to ensure accuracy; however, inadvertent computerized transcription errors may still be present.

## 2021-12-21 NOTE — ED Provider Notes (Signed)
[facility] EMERGENCY DEPARTMENT Provider Note   CSN: [id number] Arrival date & time: 12/21/21  0849     History  Chief Complaint  Patient presents with   Neck Pain    [name] is a 66 y.o. female.  With history of stroke, obesity, diabetes, hypertension, CAD, A-fib on Eliquis who presents to the ED for evaluation of progressively worsening neck pain.  Patient states neck pain began approximately 3 days ago and has only gotten worse.  No inciting injur.  Patient states she thought she may have slept wrong, however is now concerned because her pain has not improved.  Reports pain is constant in the neck and rates it at an 8 out of 10.  Describes it as sharp.  States it radiates up to the back of her head and down to her shoulders.  Has tried ibuprofen at home with mild relief.  States she woke up this morning nauseous and diaphoretic due to the pain.  Also states she gets dizzy when she turns her head too fast or changes positions.  Describes this feeling as though she is unstable on her feet.  Denies the room spinning or her head spinning on her shoulders.  Denies fevers, chills, chest pain, shortness of breath, syncope, numbness, weakness, tingling   Neck Pain      Home Medications Prior to Admission medications   Medication Sig Start Date End Date Taking? Authorizing Provider  acetaminophen (TYLENOL) 325 MG tablet Take 650 mg by mouth as needed for mild pain or moderate pain.    [provider]  albuterol (VENTOLIN HFA) 108 (90 Base) MCG/ACT inhaler Inhale 1-2 puffs into the lungs every 6 (six) hours as needed for wheezing or shortness of breath. 04/16/21   Jaynee Eagles, PA-C  ALPRAZolam Duanne Moron) 0.5 MG tablet Take 0.25-0.5 mg by mouth 2 (two) times daily as needed for anxiety or sleep. 11/21/20   [provider]  amLODipine (NORVASC) 10 MG tablet Take 1 tablet (10 mg total) by mouth daily. 01/20/21   Jettie Booze, MD  apixaban Arne Cleveland) 5 MG TABS tablet  Take 1 tablet by mouth twice daily 09/08/21   Jettie Booze, MD  aspirin EC 81 MG tablet Take 1 tablet (81 mg total) by mouth daily. 03/03/19   Jettie Booze, MD  azithromycin (ZITHROMAX) 250 MG tablet Take 1 tablet (250 mg total) by mouth daily. Take first 2 tablets together, then 1 every day until finished. 11/02/21   Francene Finders, PA-C  b complex vitamins capsule Take 1 capsule by mouth daily.    [provider]  benzonatate (TESSALON) 100 MG capsule Take 1-2 capsules (100-200 mg total) by mouth 3 (three) times daily as needed for cough. 11/02/21   Francene Finders, PA-C  BREO ELLIPTA 100-25 MCG/INH AEPB Inhale 1 puff into the lungs daily. 09/21/16   [provider]  carvedilol (COREG) 6.25 MG tablet Take 1 tablet by mouth twice daily 07/25/21   Jettie Booze, MD  Cholecalciferol (VITAMIN D-3 PO) Take 2,000 Units by mouth daily.    [provider]  escitalopram (LEXAPRO) 5 MG tablet Take 5 mg by mouth at bedtime. 11/22/20   [provider]  ezetimibe (ZETIA) 10 MG tablet Take 1 tablet by mouth once daily 12/17/21   Jettie Booze, MD  fluconazole (DIFLUCAN) 150 MG tablet Take one pill at onset of symptoms 11/03/21   Leath-Warren, Alda Lea, NP  FREESTYLE LITE test strip 1 each by  Other route as directed.  07/08/11   [provider]  furosemide (LASIX) 40 MG tablet Take 2 tablets by mouth once daily 03/10/21   Jettie Booze, MD  HUMULIN R 500 UNIT/ML SOLN injection Inject 30-80 Units into the skin See admin instructions. 80 units in the morning and 30 evening.  Sliding Scale. 70 and below do not take 12/25/13   [provider]  lisinopril (ZESTRIL) 40 MG tablet Take 1 tablet (40 mg total) by mouth daily. 01/20/21   Jettie Booze, MD  nitroGLYCERIN (NITROSTAT) 0.4 MG SL tablet Place 1 tablet (0.4 mg total) under the tongue every 5 (five) minutes as needed for chest pain (X 3 DOSES FOR CHEST PAIN). 11/30/17    Jettie Booze, MD  ondansetron (ZOFRAN-ODT) 8 MG disintegrating tablet Take 1 tablet (8 mg total) by mouth every 8 (eight) hours as needed for nausea or vomiting. 05/12/20   Scot Jun, FNP  OZEMPIC, 0.25 OR 0.5 MG/DOSE, 2 MG/1.5ML SOPN Inject 0.25 mg into the skin once a week. 04/02/21   [provider]  pantoprazole (PROTONIX) 40 MG tablet Take 40 mg by mouth daily as needed (Heartburn).    [provider]  potassium chloride (KLOR-CON 10) 10 MEQ tablet Take 1 tablet (10 mEq total) by mouth in the morning and at bedtime. 02/26/21   Jettie Booze, MD  promethazine-dextromethorphan (PROMETHAZINE-DM) 6.25-15 MG/5ML syrup Take 5 mLs by mouth at bedtime as needed for cough. 04/16/21   Jaynee Eagles, PA-C  rosuvastatin (CRESTOR) 40 MG tablet Take 1 tablet by mouth once daily 03/10/21   Jettie Booze, MD      Allergies    Bystolic Courtney Heys hcl], Crestor [rosuvastatin], Sulfa antibiotics, Canagliflozin, Cephalexin, Empagliflozin, Meloxicam, Metformin and related, Metformin hcl, Nebivolol hcl, Other, and Sitagliptin    Review of Systems   Review of Systems  Musculoskeletal:  Positive for neck pain.  Neurological:  Positive for dizziness.  All other systems reviewed and are negative.   Physical Exam Updated Vital Signs BP (!) 144/59   Pulse 73   Temp 98.2 F (36.8 C) (Oral)   Resp 19   Ht 5' 2.5" (1.588 m)   Wt 124.7 kg   LMP  (LMP Unknown)   SpO2 96%   BMI 49.50 kg/m  Physical Exam Vitals and nursing note reviewed.  Constitutional:      General: She is not in acute distress.    Appearance: Normal appearance. She is well-developed. She is obese. She is not ill-appearing, toxic-appearing or diaphoretic.  HENT:     Head: Normocephalic and atraumatic.  Eyes:     Extraocular Movements: Extraocular movements intact.     Conjunctiva/sclera: Conjunctivae normal.     Pupils: Pupils are equal, round, and reactive to light.     Comments: No nystagmus   Cardiovascular:     Rate and Rhythm: Normal rate and regular rhythm.     Pulses: Normal pulses.     Heart sounds: No murmur heard. Pulmonary:     Effort: Pulmonary effort is normal. No respiratory distress.     Breath sounds: Normal breath sounds. No stridor. No wheezing, rhonchi or rales.  Abdominal:     General: Abdomen is flat.     Palpations: Abdomen is soft.     Tenderness: There is no abdominal tenderness.  Musculoskeletal:        General: No swelling.     Cervical back: Normal range of motion and neck supple. Tenderness (Midline C-spine)  present. No rigidity.     Right lower leg: No edema.     Left lower leg: No edema.  Lymphadenopathy:     Cervical: No cervical adenopathy.  Skin:    General: Skin is warm and dry.     Capillary Refill: Capillary refill takes less than 2 seconds.  Neurological:     General: No focal deficit present.     Mental Status: She is alert and oriented to person, place, and time.     Comments: No unilateral or global weakness, facial symmetry, slurred speech, pronator drift.  Sensation normal.  Heel-to-shin normal.  Psychiatric:        Mood and Affect: Mood normal.        Behavior: Behavior normal.     ED Results / Procedures / Treatments   Labs (all labs ordered are listed, but only abnormal results are displayed) Labs Reviewed  BASIC METABOLIC PANEL  CBC WITH DIFFERENTIAL/PLATELET    EKG None  Radiology No results found.  Procedures Procedures    Medications Ordered in ED Medications - No data to display  ED Course/ Medical Decision Making/ A&P Clinical Course as of 12/21/21 1357  Sun Dec 21, 2021  1228 CT Angio Head Neck W WO CM I personally reviewed the image.  No acute abnormalities [AS]    Clinical Course User Index [AS] Khian Remo, Grafton Folk, PA-C                           Medical Decision Making Amount and/or Complexity of Data Reviewed Labs: ordered. Radiology: ordered. Decision-making details documented in ED  Course.  Risk Prescription drug management.  This patient presents to the ED for concern of neck pain, this involves an extensive number of treatment options, and is a complaint that carries with it a high risk of complications and morbidity.  The differential diagnosis includes stroke, vertebral artery dissection, muscular strain, meningitis   Co morbidities that complicate the patient evaluation   A-fib on Eliquis, previous stroke, obesity, diabetes, hypertension  My initial workup includes basic labs, CTA head and neck  Additional history obtained from: Nursing notes from this visit. Family daughters present and provides a portion of the history  I ordered, reviewed and interpreted labs which include: BMP, CBC.  All labs within normal limits   I ordered imaging studies including CTA head and neck I independently visualized and interpreted imaging which showed normal I agree with the radiologist interpretation  Afebrile, hemodynamically stable.  Patient is a 66 year old female who presents ED for evaluation of neck pain and dizziness for the past 3 days.  Neuro exam unremarkable.  Physical exam remarkable for mild tenderness to palpation lateral aspects of her neck.  No midline C-spine tenderness.  CTA head and neck was obtained and was normal.  I have low suspicion for acute intracranial or vascular abnormalities as a cause of her pain.  Pain appears to be musculoskeletal.  Patient was treated with IV pain medicine and reports significant improvement in her symptoms.  Patient also complaining of dizziness, but this is only when she moves her head in order changes positions quickly.  Reports the room is spinning.  Symptoms consistent with vertigo.  Patient was encouraged to follow-up with her primary care provider regarding this.  Gave patient strict return precautions.  Stable at discharge.  At this time there does not appear to be any evidence of an acute emergency medical condition  and the patient  appears stable for discharge with appropriate outpatient follow up. Diagnosis was discussed with patient who verbalizes understanding of care plan and is agreeable to discharge. I have discussed return precautions with patient and daughter who verbalizes understanding. Patient encouraged to follow-up with their PCP within 1 week. All questions answered.  Patient's case discussed with Dr. Mayra Neer who agrees with plan to discharge with follow-up.   Note: Portions of this report may have been transcribed using voice recognition software. Every effort was made to ensure accuracy; however, inadvertent computerized transcription errors may still be present.          Final Clinical Impression(s) / ED Diagnoses Final diagnoses:  None    Rx / DC Orders ED Discharge Orders     None         Roylene Reason, PA-C 12/21/21 1419    Audley Hose, MD 12/21/21 1704

## 2021-12-30 ENCOUNTER — Ambulatory Visit: Payer: Medicare Other | Admitting: Neurology

## 2022-01-02 ENCOUNTER — Other Ambulatory Visit: Payer: Self-pay | Admitting: Interventional Cardiology

## 2022-01-11 ENCOUNTER — Other Ambulatory Visit: Payer: Self-pay | Admitting: Interventional Cardiology

## 2022-01-22 ENCOUNTER — Ambulatory Visit (INDEPENDENT_AMBULATORY_CARE_PROVIDER_SITE_OTHER): Payer: Medicare Other | Admitting: Neurology

## 2022-01-22 ENCOUNTER — Encounter: Payer: Self-pay | Admitting: Neurology

## 2022-01-22 VITALS — BP 136/73 | HR 74 | Ht 62.0 in | Wt 274.0 lb

## 2022-01-22 DIAGNOSIS — I6389 Other cerebral infarction: Secondary | ICD-10-CM | POA: Diagnosis not present

## 2022-01-22 DIAGNOSIS — I480 Paroxysmal atrial fibrillation: Secondary | ICD-10-CM

## 2022-01-22 DIAGNOSIS — I2510 Atherosclerotic heart disease of native coronary artery without angina pectoris: Secondary | ICD-10-CM

## 2022-01-22 NOTE — Patient Instructions (Signed)
Continue current medications Continue to follow with the primary care doctor and cardiologist Increase physical activity, recommend increasing exercise including walking, at least 20 minutes a day 5 days a week. Follow-up in 1 year or sooner if worse.

## 2022-01-22 NOTE — Progress Notes (Signed)
[facility]  PATIENT: [name] DOB: 12-22-1955  REQUESTING CLINICIAN: Carol Ada, MD HISTORY FROM: Patient and niece  REASON FOR VISIT: Left occipital stroke follow up.    HISTORICAL  CHIEF COMPLAINT:  Chief Complaint  Patient presents with   Follow-up    Rm 15, States she is doing well and stable    INTERVAL HISTORY 01/22/2022:  Patient presents today for follow-up, she is accompanied by her niece.  Last visit was a year ago.  At that time we had continue all the medications.  She is on Eliquis for atrial fibrillation and still on aspirin for his cardiac stents.  Denies any current deficits.  She is ambulatory but state that she is not as active as she would want.  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  Enjoys her work.  Reported she is somehow forgetful but other than that she is doing well.  Again no deficit from a stroke. She denies any new seizure or seizure-like activity.  Her previous EEG showed left posterior quadrant slowing which is consistent with the area where she had her stroke.   HISTORY OF PRESENT ILLNESS:  This is a 66 year old woman with past medical history of atrial fibrillation, hypertension, hyperlipidemia, obesity, diabetes mellitus type 2, anxiety and asthma who is presenting after recent admission for facial droop and syncope and found to have a 64m left occipital stroke.  Patient was at her psychiatrist office when she started complaining of tingling of left side of face and around her left eye who directed her to the ED.  In the ED she was noted to be hypertensive in the 1209systolic and reported had a syncopal episode with urinary incontinence when the phlebotomist entered the room.  She was found to be in A. Fib with RVR and had a MRI brain which showed a 3 mm acute left occipital stroke.  She was noted to have no neurological deficit from the left acute stroke.  She had a carotid ultrasound which did  not show any high-grade stenosis, her echocardiogram showed normal ventricular function no thrombus noted.  She was started on aspirin and Eliquis.  Her A1c was 9 and she was started on insulin.  Patient report prior to going to the hospital she stopped taking her aspirin and Plavix (she had cardiac stents) but on discharge was also was only put back on aspirin no Plavix.  She is supposed to follow-up with a cardiologist in 2 days.   Since discharge, she has been complaining of blurred vision, nausea but no vomiting. She also reports increase anxiety.    OTHER MEDICAL CONDITIONS: Atrial fibrillation, HTN, HLD, Obesity, Anxiety DMII, and Asthma    REVIEW OF SYSTEMS: Full 14 system review of systems performed and negative with exception of: as noted in the HPI   ALLERGIES: Allergies  Allergen Reactions   Bystolic [Nebivolol Hcl] Palpitations   Crestor [Rosuvastatin] Other (See Comments)    Couldn't tolerate Crestor 40 mg dose    Sulfa Antibiotics Anaphylaxis   Canagliflozin Other (See Comments)    Other reaction(s): yeast infection   Cephalexin     Other reaction(s): stomach upset   Empagliflozin     Other reaction(s): yeast infection   Meloxicam     Other reaction(s): palpitations   Metformin And Related Diarrhea   Metformin Hcl     Other reaction(s): stomach upset   Nebivolol Hcl     Other reaction(s): syncope  Other     Ok Edwards anything that has this in it makes her wheeze   Sitagliptin     Other reaction(s): ??    HOME MEDICATIONS: Outpatient Medications Prior to Visit  Medication Sig Dispense Refill   acetaminophen (TYLENOL) 325 MG tablet Take 650 mg by mouth as needed for mild pain or moderate pain.     albuterol (VENTOLIN HFA) 108 (90 Base) MCG/ACT inhaler Inhale 1-2 puffs into the lungs every 6 (six) hours as needed for wheezing or shortness of breath. 18 g 0   ALPRAZolam (XANAX) 0.5 MG tablet Take 0.25-0.5 mg by mouth 2 (two) times daily as needed for anxiety or sleep.      amLODipine (NORVASC) 10 MG tablet Take 1 tablet (10 mg total) by mouth daily. 90 tablet 3   apixaban (ELIQUIS) 5 MG TABS tablet Take 1 tablet by mouth twice daily 180 tablet 1   aspirin EC 81 MG tablet Take 1 tablet (81 mg total) by mouth daily. 90 tablet 3   azithromycin (ZITHROMAX) 250 MG tablet Take 1 tablet (250 mg total) by mouth daily. Take first 2 tablets together, then 1 every day until finished. 6 tablet 0   b complex vitamins capsule Take 1 capsule by mouth daily.     benzonatate (TESSALON) 100 MG capsule Take 1-2 capsules (100-200 mg total) by mouth 3 (three) times daily as needed for cough. 30 capsule 0   BREO ELLIPTA 100-25 MCG/INH AEPB Inhale 1 puff into the lungs daily.     carvedilol (COREG) 6.25 MG tablet Take 1 tablet by mouth twice daily 180 tablet 3   Cholecalciferol (VITAMIN D-3 PO) Take 2,000 Units by mouth daily.     escitalopram (LEXAPRO) 5 MG tablet Take 5 mg by mouth at bedtime.     ezetimibe (ZETIA) 10 MG tablet Take 1 tablet by mouth once daily 90 tablet 2   fluconazole (DIFLUCAN) 150 MG tablet Take one pill at onset of symptoms 1 tablet 0   FREESTYLE LITE test strip 1 each by Other route as directed.      furosemide (LASIX) 40 MG tablet Take 2 tablets by mouth once daily 180 tablet 2   HUMULIN R 500 UNIT/ML SOLN injection Inject 30-80 Units into the skin See admin instructions. 80 units in the morning and 30 evening.  Sliding Scale. 70 and below do not take     lisinopril (ZESTRIL) 40 MG tablet Take 1 tablet by mouth once daily 90 tablet 0   nitroGLYCERIN (NITROSTAT) 0.4 MG SL tablet Place 1 tablet (0.4 mg total) under the tongue every 5 (five) minutes as needed for chest pain (X 3 DOSES FOR CHEST PAIN). 25 tablet 3   ondansetron (ZOFRAN-ODT) 8 MG disintegrating tablet Take 1 tablet (8 mg total) by mouth every 8 (eight) hours as needed for nausea or vomiting. 30 tablet 0   OZEMPIC, 0.25 OR 0.5 MG/DOSE, 2 MG/1.5ML SOPN Inject 0.25 mg into the skin once a week.      pantoprazole (PROTONIX) 40 MG tablet Take 40 mg by mouth daily as needed (Heartburn).     potassium chloride (KLOR-CON 10) 10 MEQ tablet Take 1 tablet (10 mEq total) by mouth in the morning and at bedtime. 180 tablet 3   rosuvastatin (CRESTOR) 40 MG tablet Take 1 tablet by mouth once daily 90 tablet 2   promethazine-dextromethorphan (PROMETHAZINE-DM) 6.25-15 MG/5ML syrup Take 5 mLs by mouth at bedtime as needed for cough. 100 mL 0   No facility-administered medications  prior to visit.    PAST MEDICAL HISTORY: Past Medical History:  Diagnosis Date   Arthritis    Asthma    Coronary artery disease    Diabetes mellitus    Diabetic neuropathy (HCC)    GERD (gastroesophageal reflux disease)    History of hiatal hernia    Hx of cardiovascular stress test    Lexiscan Myoview (10/15):  Medium area of ischemia in AL and IL distribution, cannot completely exclude shifting breast attenuation, EF 64%; Abnormal study   Hyperlipidemia    Hypertension    Mild carotid artery disease (HCC)    Morbid obesity ([place])    PAF (paroxysmal atrial fibrillation) ([place])    Sleep apnea    does not wear CPAP   Stroke (cerebrum) ([place])    Syncope     PAST SURGICAL HISTORY: Past Surgical History:  Procedure Laterality Date   [place]   right   BIOPSY  07/22/2021   Procedure: BIOPSY;  Surgeon: Ronnette Juniper, MD;  Location: [facility];  Service: Gastroenterology;;   COLONOSCOPY     COLONOSCOPY WITH PROPOFOL N/A 07/22/2021   Procedure: COLONOSCOPY WITH PROPOFOL;  Surgeon: Ronnette Juniper, MD;  Location: [facility];  Service: Gastroenterology;  Laterality: N/A;   DILATION AND CURETTAGE OF UTERUS     ESOPHAGOGASTRODUODENOSCOPY (EGD) WITH PROPOFOL N/A 07/22/2021   Procedure: ESOPHAGOGASTRODUODENOSCOPY (EGD) WITH PROPOFOL;  Surgeon: Ronnette Juniper, MD;  Location: [facility];  Service: Gastroenterology;  Laterality: N/A;   HAND SURGERY     right   heart stent  2007   PARS PLANA VITRECTOMY Left  03/24/2017   Procedure: PARS PLANA VITRECTOMY WITH 25 GAUGE;  Surgeon: Hurman Horn, MD;  Location: [facility];  Service: Ophthalmology;  Laterality: Left;   POLYPECTOMY  07/22/2021   Procedure: POLYPECTOMY;  Surgeon: Ronnette Juniper, MD;  Location: [facility];  Service: Gastroenterology;;   UTERINE FIBROID SURGERY  2008    FAMILY HISTORY: Family History  Problem Relation Age of Onset   Kidney disease Father    Stroke Father    Heart attack Father    Stroke Maternal Grandfather    Stroke Paternal Grandfather    Asthma Other    Hypertension Other    Diabetes Other     SOCIAL HISTORY: Social History   Socioeconomic History   Marital status: Single    Spouse name: Not on file   Number of children: Not on file   Years of education: Not on file   Highest education level: Not on file  Occupational History   Not on file  Tobacco Use   Smoking status: Former    Types: Cigarettes    Quit date: 08/07/1982    Years since quitting: 39.4   Smokeless tobacco: Never  Vaping Use   Vaping Use: Never used  Substance and Sexual Activity   Alcohol use: No   Drug use: No   Sexual activity: Not on file  Other Topics Concern   Not on file  Social History Narrative   Not on file   Social Determinants of Health   Financial Resource Strain: Not on file  Food Insecurity: Not on file  Transportation Needs: Not on file  Physical Activity: Not on file  Stress: Not on file  Social Connections: Not on file  Intimate Partner Violence: Not on file     PHYSICAL EXAM  GENERAL EXAM/CONSTITUTIONAL: Vitals:  Vitals:   01/22/22 0807  BP: 136/73  Pulse: 74  Weight: 274 lb (124.3  kg)  Height: '5\' 2"'$  (1.575 m)    Body mass index is 50.12 kg/m. Wt Readings from Last 3 Encounters:  01/22/22 274 lb (124.3 kg)  12/21/21 275 lb (124.7 kg)  11/02/21 275 lb (124.7 kg)   Patient is in no distress; well developed, nourished and groomed; neck is supple, obese woman   EYES: Visual fields full to  confrontation, Extraocular movements intacts,   MUSCULOSKELETAL: Gait, strength, tone, movements noted in Neurologic exam below  NEUROLOGIC: MENTAL STATUS:      No data to display         awake, alert, oriented to person, place and time recent and remote memory intact normal attention and concentration language fluent, comprehension intact, naming intact fund of knowledge appropriate  CRANIAL NERVE:  2nd, 3rd, 4th, 6th - visual fields full to confrontation, extraocular muscles intact, no nystagmus 5th - facial sensation symmetric 7th - facial strength symmetric 8th - hearing intact 9th - palate elevates symmetrically, uvula midline 11th - shoulder shrug symmetric 12th - tongue protrusion midline  MOTOR:  normal bulk and tone, full strength in the BUE, BLE  SENSORY:  normal and symmetric to light touch, pinprick, temperature, vibration  COORDINATION:  finger-nose-finger, fine finger movements normal  REFLEXES:  deep tendon reflexes present and symmetric  GAIT/STATION:  normal   DIAGNOSTIC DATA (LABS, IMAGING, TESTING) - I reviewed patient records, labs, notes, testing and imaging myself where available.  Lab Results  Component Value Date   WBC 8.4 12/21/2021   HGB 13.7 12/21/2021   HCT 39.6 12/21/2021   MCV 79.2 (L) 12/21/2021   PLT 304 12/21/2021      Component Value Date/Time   NA 139 12/21/2021 1014   NA 138 03/11/2020 0737   K 3.6 12/21/2021 1014   CL 103 12/21/2021 1014   CO2 27 12/21/2021 1014   GLUCOSE 75 12/21/2021 1014   BUN 11 12/21/2021 1014   BUN 12 03/11/2020 0737   CREATININE 0.69 12/21/2021 1014   CREATININE 0.69 01/05/2012 1629   CALCIUM 9.5 12/21/2021 1014   PROT 7.2 02/26/2021 0812   ALBUMIN 4.3 02/26/2021 0812   AST 19 02/26/2021 0812   ALT 15 02/26/2021 0812   ALKPHOS 89 02/26/2021 0812   BILITOT 0.5 02/26/2021 0812   GFRNONAA >60 12/21/2021 1014   GFRAA 105 03/11/2020 0737   Lab Results  Component Value Date   CHOL 114  02/26/2021   HDL 37 (L) 02/26/2021   LDLCALC 63 02/26/2021   TRIG 66 02/26/2021   CHOLHDL 3.1 02/26/2021   Lab Results  Component Value Date   HGBA1C 9.0 (H) 11/28/2020   No results found for: "VITAMINB12" Lab Results  Component Value Date   TSH 3.797 11/27/2020    MRI Brain 11/28/2020 3 mm acute infarct left occipital lobe Moderate chronic ischemic changes.  MRI Brain 12/20/2020 No acute intracranial process. No evidence of acute infarction or hemorrhage.  Bilateral carotid US 10/22 Mild bilateral carotid atherosclerosis. Negative for stenosis.nDegree of narrowing less than 50% bilaterally by ultrasound criteria. Patent antegrade vertebral flow bilaterally  Echo 11/28/20 1. Left ventricular ejection fraction, by estimation, is 60 to 65%. The left ventricle has normal function. The left ventricle has no regional wall motion abnormalities. There is moderate concentric left ventricular hypertrophy. Left ventricular diastolic parameters are consistent with Grade I diastolic dysfunction (impaired relaxation). Elevated left ventricular end-diastolic pressure.  2. RV-RA gradient normal at 27 mmHg suggesting normal RVSP. Right ventricular systolic function is normal. The right ventricular size  is normal.  3. The mitral valve is grossly normal. Trivial mitral valve regurgitation.  4. The aortic valve is tricuspid. Aortic valve regurgitation is not visualized. Aortic valve mean gradient measures 5.0 mmHg.  5. Unable to estimate CVP.   Routine EEG 12/31/2020 Left posterior quadrant slowing   ASSESSMENT AND PLAN  66 y.o. year old female with vascular risk factor including diabetes mellitus, hypertension, hyperlipidemia, coronary artery disease and obesity,  left occipital stroke here for follow up.  From her stroke perspective, she denies any new deficit.  She is compliant with all her medications including aspirin and Eliquis, antihypertensive medications and Crestor.  Denies any new  deficit.  At the moment we will continue current medications and I have encouraged patient to increase her physical therapy activity. She has not had any additional seizure or seizure-like activity, denies any additional syncope.  Will continue to monitor.  Follow-up in 1 year or sooner if worse.    1. Cerebrovascular accident (CVA) due to other mechanism ([place])   2. Paroxysmal atrial fibrillation with RVR ([place])   3. Coronary artery disease, unspecified vessel or lesion type, unspecified whether angina present, unspecified whether native or transplanted heart      Patient Instructions  Continue current medications Continue to follow with the primary care doctor and cardiologist Increase physical activity, recommend increasing exercise including walking, at least 20 minutes a day 5 days a week. Follow-up in 1 year or sooner if worse.   No orders of the defined types were placed in this encounter.    No orders of the defined types were placed in this encounter.    Return in about 1 year (around 01/23/2023).    Alric Ran, MD 01/22/2022, 9:39 AM  [facility] [address], [place], [place] [address] [phone]

## 2022-02-16 ENCOUNTER — Other Ambulatory Visit: Payer: Self-pay | Admitting: *Deleted

## 2022-02-16 ENCOUNTER — Other Ambulatory Visit: Payer: Self-pay | Admitting: Interventional Cardiology

## 2022-02-16 MED ORDER — AMLODIPINE BESYLATE 10 MG PO TABS: 10.0000 mg | ORAL_TABLET | Freq: Every day | ORAL | 3 refills | Status: DC

## 2022-02-18 ENCOUNTER — Other Ambulatory Visit: Payer: Self-pay

## 2022-02-18 ENCOUNTER — Emergency Department (HOSPITAL_COMMUNITY)
Admission: EM | Admit: 2022-02-18 | Discharge: 2022-02-19 | Disposition: A | Discharge status: Home / Self Care | Payer: Medicare Other | Attending: Emergency Medicine | Admitting: Emergency Medicine

## 2022-02-18 ENCOUNTER — Emergency Department (HOSPITAL_COMMUNITY): Payer: Medicare Other

## 2022-02-18 ENCOUNTER — Encounter (HOSPITAL_COMMUNITY): Payer: Self-pay | Admitting: *Deleted

## 2022-02-18 DIAGNOSIS — I10 Essential (primary) hypertension: Secondary | ICD-10-CM | POA: Diagnosis not present

## 2022-02-18 DIAGNOSIS — Z79899 Other long term (current) drug therapy: Secondary | ICD-10-CM | POA: Insufficient documentation

## 2022-02-18 DIAGNOSIS — E119 Type 2 diabetes mellitus without complications: Secondary | ICD-10-CM | POA: Diagnosis not present

## 2022-02-18 DIAGNOSIS — I2510 Atherosclerotic heart disease of native coronary artery without angina pectoris: Secondary | ICD-10-CM | POA: Insufficient documentation

## 2022-02-18 DIAGNOSIS — Z7901 Long term (current) use of anticoagulants: Secondary | ICD-10-CM | POA: Insufficient documentation

## 2022-02-18 DIAGNOSIS — R197 Diarrhea, unspecified: Secondary | ICD-10-CM | POA: Insufficient documentation

## 2022-02-18 DIAGNOSIS — R112 Nausea with vomiting, unspecified: Secondary | ICD-10-CM | POA: Diagnosis present

## 2022-02-18 DIAGNOSIS — Z7982 Long term (current) use of aspirin: Secondary | ICD-10-CM | POA: Insufficient documentation

## 2022-02-18 LAB — CBC
HCT: 40.8 % (ref 36.0–46.0)
Hemoglobin: 14.4 g/dL (ref 12.0–15.0)
MCH: 27.8 pg (ref 26.0–34.0)
MCHC: 35.3 g/dL (ref 30.0–36.0)
MCV: 78.8 fL — ABNORMAL LOW (ref 80.0–100.0)
Platelets: 305 10*3/uL (ref 150–400)
RBC: 5.18 MIL/uL — ABNORMAL HIGH (ref 3.87–5.11)
RDW: 14.4 % (ref 11.5–15.5)
WBC: 7.7 10*3/uL (ref 4.0–10.5)
nRBC: 0 % (ref 0.0–0.2)

## 2022-02-18 LAB — COMPREHENSIVE METABOLIC PANEL
ALT: 26 U/L (ref 0–44)
AST: 24 U/L (ref 15–41)
Albumin: 4.2 g/dL (ref 3.5–5.0)
Alkaline Phosphatase: 80 U/L (ref 38–126)
Anion gap: 10 (ref 5–15)
BUN: 14 mg/dL (ref 8–23)
CO2: 23 mmol/L (ref 22–32)
Calcium: 9.4 mg/dL (ref 8.9–10.3)
Chloride: 103 mmol/L (ref 98–111)
Creatinine, Ser: 0.81 mg/dL (ref 0.44–1.00)
GFR, Estimated: >60 mL/min (ref >60)
Glucose, Bld: 224 mg/dL — ABNORMAL HIGH (ref 70–99)
Potassium: 3.6 mmol/L (ref 3.5–5.1)
Sodium: 136 mmol/L (ref 135–145)
Total Bilirubin: 1 mg/dL (ref 0.3–1.2)
Total Protein: 8 g/dL (ref 6.5–8.1)

## 2022-02-18 LAB — URINALYSIS, ROUTINE W REFLEX MICROSCOPIC
Bilirubin Urine: NEGATIVE
Glucose, UA: 50 mg/dL — AB
Hgb urine dipstick: NEGATIVE
Ketones, ur: 20 mg/dL — AB
Leukocytes,Ua: NEGATIVE
Nitrite: NEGATIVE
Protein, ur: 100 mg/dL — AB
Specific Gravity, Urine: 1.026 (ref 1.005–1.030)
pH: 5 (ref 5.0–8.0)

## 2022-02-18 LAB — LIPASE, BLOOD: Lipase: 27 U/L (ref 11–51)

## 2022-02-18 MED ORDER — IOHEXOL 300 MG/ML  SOLN
75.0000 mL | Freq: Once | INTRAMUSCULAR | Status: AC | PRN
  Administered 2022-02-18: 75 mL via INTRAVENOUS

## 2022-02-18 MED ORDER — ONDANSETRON HCL 4 MG/2ML IJ SOLN
4.0000 mg | Freq: Once | INTRAMUSCULAR | Status: AC
  Administered 2022-02-18: 4 mg via INTRAVENOUS
  Filled 2022-02-18: qty 2

## 2022-02-18 MED ORDER — SODIUM CHLORIDE 0.9 % IV BOLUS
1000.0000 mL | Freq: Once | INTRAVENOUS | Status: AC
  Administered 2022-02-18: 1000 mL via INTRAVENOUS

## 2022-02-18 NOTE — ED Triage Notes (Signed)
Pt states she has been sick since eating something from [place] today.  Pt states she has had emesis about 7 times today and diarrhea x 8 today

## 2022-02-18 NOTE — ED Provider Notes (Signed)
[facility] EMERGENCY DEPARTMENT Provider Note   CSN: [id number] Arrival date & time: 02/18/22  2027     History  Chief Complaint  Patient presents with   Emesis    [name] is a 67 y.o. female.  Patient with history of morbid obesity, diabetes, CAD, CVA, hypertension, hyperlipidemia presents today with complaints of nausea, vomiting, and diarrhea. She states that same has been ongoing since 11 am today since she ate [employer]. She states that she has had NBNB emesis about 7 times today and diarrhea x 8 today. She denies any fevers or chills. No hx of abdominal surgeries. No known sick contacts. Denies cough or congestion. Denies any abdominal pain, hematuria, or dysuria.  The history is provided by the patient. No language interpreter was used.  Emesis Associated symptoms: diarrhea        Home Medications Prior to Admission medications   Medication Sig Start Date End Date Taking? Authorizing Provider  acetaminophen (TYLENOL) 325 MG tablet Take 650 mg by mouth as needed for mild pain or moderate pain.   Yes [provider]  albuterol (VENTOLIN HFA) 108 (90 Base) MCG/ACT inhaler Inhale 1-2 puffs into the lungs every 6 (six) hours as needed for wheezing or shortness of breath. 04/16/21  Yes Jaynee Eagles, PA-C  ALPRAZolam Duanne Moron) 0.5 MG tablet Take 0.25-0.5 mg by mouth 2 (two) times daily as needed for anxiety or sleep. 11/21/20  Yes [provider]  amLODipine (NORVASC) 10 MG tablet Take 1 tablet (10 mg total) by mouth daily. 02/16/22  Yes Jettie Booze, MD  apixaban Arne Cleveland) 5 MG TABS tablet Take 1 tablet by mouth twice daily 09/08/21  Yes Jettie Booze, MD  HUMULIN R U-500 KWIKPEN 500 UNIT/ML KwikPen Inject into the skin. 02/03/22  Yes [provider]  HYDROcodone-acetaminophen (NORCO/VICODIN) 5-325 MG tablet Take 1 tablet by mouth every 4 (four) hours as needed. 01/29/22  Yes [provider]  amoxicillin (AMOXIL) 500 MG capsule  Take 500 mg by mouth 3 (three) times daily. Patient not taking: Reported on 02/18/2022 01/29/22   [provider]  aspirin EC 81 MG tablet Take 1 tablet (81 mg total) by mouth daily. 03/03/19   Jettie Booze, MD  azithromycin (ZITHROMAX) 250 MG tablet Take 1 tablet (250 mg total) by mouth daily. Take first 2 tablets together, then 1 every day until finished. 11/02/21   Francene Finders, PA-C  b complex vitamins capsule Take 1 capsule by mouth daily.    [provider]  benzonatate (TESSALON) 100 MG capsule Take 1-2 capsules (100-200 mg total) by mouth 3 (three) times daily as needed for cough. 11/02/21   Francene Finders, PA-C  BREO ELLIPTA 100-25 MCG/INH AEPB Inhale 1 puff into the lungs daily. 09/21/16   [provider]  carvedilol (COREG) 6.25 MG tablet Take 1 tablet by mouth twice daily 07/25/21   Jettie Booze, MD  Cholecalciferol (VITAMIN D-3 PO) Take 2,000 Units by mouth daily.    [provider]  escitalopram (LEXAPRO) 5 MG tablet Take 5 mg by mouth at bedtime. 11/22/20   [provider]  ezetimibe (ZETIA) 10 MG tablet Take 1 tablet by mouth once daily 12/17/21   Jettie Booze, MD  fluconazole (DIFLUCAN) 150 MG tablet Take one pill at onset of symptoms 11/03/21   Leath-Warren, Alda Lea, NP  FREESTYLE LITE test strip 1 each by Other route as directed.  07/08/11   [provider]  furosemide (LASIX) 40  MG tablet Take 2 tablets by mouth once daily 03/10/21   Jettie Booze, MD  HUMULIN R 500 UNIT/ML SOLN injection Inject 30-80 Units into the skin See admin instructions. 80 units in the morning and 30 evening.  Sliding Scale. 70 and below do not take 12/25/13   [provider]  lisinopril (ZESTRIL) 40 MG tablet Take 1 tablet by mouth once daily 01/13/22   Jettie Booze, MD  nitroGLYCERIN (NITROSTAT) 0.4 MG SL tablet Place 1 tablet (0.4 mg total) under the tongue every 5 (five) minutes as needed for chest  pain (X 3 DOSES FOR CHEST PAIN). 11/30/17   Jettie Booze, MD  ondansetron (ZOFRAN-ODT) 8 MG disintegrating tablet Take 1 tablet (8 mg total) by mouth every 8 (eight) hours as needed for nausea or vomiting. 05/12/20   Scot Jun, FNP  OZEMPIC, 0.25 OR 0.5 MG/DOSE, 2 MG/1.5ML SOPN Inject 0.25 mg into the skin once a week. 04/02/21   [provider]  pantoprazole (PROTONIX) 40 MG tablet Take 40 mg by mouth daily as needed (Heartburn).    [provider]  potassium chloride (KLOR-CON 10) 10 MEQ tablet Take 1 tablet (10 mEq total) by mouth in the morning and at bedtime. 02/26/21   Jettie Booze, MD  rosuvastatin (CRESTOR) 40 MG tablet Take 1 tablet by mouth once daily 01/05/22   Jettie Booze, MD      Allergies    Bystolic Courtney Heys hcl], Crestor [rosuvastatin], Sulfa antibiotics, Canagliflozin, Cephalexin, Empagliflozin, Meloxicam, Metformin and related, Metformin hcl, Nebivolol hcl, Other, and Sitagliptin    Review of Systems   Review of Systems  Gastrointestinal:  Positive for diarrhea, nausea and vomiting.  All other systems reviewed and are negative.   Physical Exam Updated Vital Signs BP (!) 142/81 (BP Location: Left Arm)   Pulse (!) 107   Temp 99.7 F (37.6 C) (Oral)   Resp (!) 21   Ht '5\' 2"'$  (1.575 m)   Wt 129.3 kg   LMP  (LMP Unknown)   SpO2 94%   BMI 52.13 kg/m  Physical Exam Vitals and nursing note reviewed.  Constitutional:      General: She is not in acute distress.    Appearance: Normal appearance. She is normal weight. She is not ill-appearing, toxic-appearing or diaphoretic.  HENT:     Head: Normocephalic and atraumatic.  Cardiovascular:     Rate and Rhythm: Normal rate.  Pulmonary:     Effort: Pulmonary effort is normal. No respiratory distress.  Abdominal:     General: Abdomen is flat.     Palpations: Abdomen is soft.     Tenderness: There is no abdominal tenderness.  Musculoskeletal:        General: Normal range  of motion.     Cervical back: Normal range of motion.  Skin:    General: Skin is warm and dry.  Neurological:     General: No focal deficit present.     Mental Status: She is alert.  Psychiatric:        Mood and Affect: Mood normal.        Behavior: Behavior normal.     ED Results / Procedures / Treatments   Labs (all labs ordered are listed, but only abnormal results are displayed) Labs Reviewed  COMPREHENSIVE METABOLIC PANEL - Abnormal; Notable for the following components:      Result Value   Glucose, Bld 224 (*)    All other components within normal limits  CBC -  Abnormal; Notable for the following components:   RBC 5.18 (*)    MCV 78.8 (*)    All other components within normal limits  URINALYSIS, ROUTINE W REFLEX MICROSCOPIC - Abnormal; Notable for the following components:   APPearance HAZY (*)    Glucose, UA 50 (*)    Ketones, ur 20 (*)    Protein, ur 100 (*)    Bacteria, UA FEW (*)    All other components within normal limits  LIPASE, BLOOD    EKG None  Radiology CT ABDOMEN PELVIS W CONTRAST  Result Date: 02/18/2022 CLINICAL DATA:  Acute abdominal pain EXAM: CT ABDOMEN AND PELVIS WITH CONTRAST TECHNIQUE: Multidetector CT imaging of the abdomen and pelvis was performed using the standard protocol following bolus administration of intravenous contrast. RADIATION DOSE REDUCTION: This exam was performed according to the departmental dose-optimization program which includes automated exposure control, adjustment of the mA and/or kV according to patient size and/or use of iterative reconstruction technique. CONTRAST:  21m OMNIPAQUE IOHEXOL 300 MG/ML  SOLN COMPARISON:  CT abdomen and pelvis 05/20/2020 FINDINGS: Lower chest: No acute abnormality. Hepatobiliary: No focal liver abnormality is seen. No gallstones, gallbladder wall thickening, or biliary dilatation. Pancreas: Unremarkable. No pancreatic ductal dilatation or surrounding inflammatory changes. Spleen: Normal in  size without focal abnormality. Adrenals/Urinary Tract: There are right renal calculi measuring up to 14 mm. Right renal cysts are again seen measuring up 2 6.5 cm. Left kidney appears normal. There is no hydronephrosis or perinephric fat stranding. The adrenal glands and bladder are within normal limits. Stomach/Bowel: Stomach is within normal limits. Appendix appears normal. No evidence of bowel wall thickening, distention, or inflammatory changes. There is a small hiatal hernia. Vascular/Lymphatic: Aortic atherosclerosis. No enlarged abdominal or pelvic lymph nodes. Reproductive: Calcified uterine fibroids are again seen measuring up to 3.7 cm. Adnexa are unremarkable. Other: There is a small fat containing umbilical hernia. There is no ascites. Musculoskeletal: Degenerative changes affect the spine IMPRESSION: 1. No acute localizing process in the abdomen or pelvis. 2. Nonobstructing right renal calculi. 3. Fibroid uterus. 4. Right Bosniak I benign renal cyst measuring 6.5 cm. No follow-up imaging is recommended. JACR 2018 Feb; 264-273, Management of the Incidental Renal Mass on CT, [employer] 2021; 814-848, Bosniak Classification of Cystic Renal Masses, Version 2019. Aortic Atherosclerosis (ICD10-I70.0). Electronically Signed   By: ARonney AstersM.D.   On: 02/18/2022 23:02    Procedures Procedures    Medications Ordered in ED Medications  sodium chloride 0.9 % bolus 1,000 mL (1,000 mLs Intravenous New Bag/Given 02/18/22 2221)  ondansetron (ZOFRAN) injection 4 mg (4 mg Intravenous Given 02/18/22 2221)  iohexol (OMNIPAQUE) 300 MG/ML solution 75 mL (75 mLs Intravenous Contrast Given 02/18/22 2238)    ED Course/ Medical Decision Making/ A&P                           Medical Decision Making Amount and/or Complexity of Data Reviewed Labs: ordered. Radiology: ordered.  Risk Prescription drug management.   This patient is a 67y.o. female who presents to the ED for concern of nausea, vomiting,  diarrhea, this involves an extensive number of treatment options, and is a complaint that carries with it a high risk of complications and morbidity. The emergent differential diagnosis prior to evaluation includes, but is not limited to, AAA, gastroenteritis, appendicitis, Bowel obstruction, Bowel perforation. Gastroparesis, DKA, Hernia, Inflammatory bowel disease, mesenteric ischemia, pancreatitis, peritonitis SBP, volvulus.  This is not an exhaustive  differential.   Past Medical History / Co-morbidities / Social History: Hx of morbid obesity, diabetes, CAD, CVA, hypertension, hyperlipidemia  Physical Exam: Physical exam performed. The pertinent findings include: abdomen soft and nontender  Lab Tests: I ordered, and personally interpreted labs.  The pertinent results include:  Glucose 224. UA with glucose, ketones, protein   Imaging Studies: I ordered imaging studies including Ct abdomen pelvis. I independently visualized and interpreted imaging which showed   1. No acute localizing process in the abdomen or pelvis. 2. Nonobstructing right renal calculi. 3. Fibroid uterus.  I agree with the radiologist interpretation.    Medications: I ordered medication including zofran and fluids  for nausea/vomiting, and dehydration. Reevaluation of the patient after these medicines showed that the patient resolved. I have reviewed the patients home medicines and have made adjustments as needed.    Disposition:  Patient presents today with complaints of nausea, vomiting, and diarrhea since earlier today.  She is afebrile, nontoxic-appearing, and in no acute distress with reassuring vital signs.  Physical exam reveals abdomen that is soft and nontender. Patient is nontoxic, nonseptic appearing, in no apparent distress.  Patient's pain and other symptoms adequately managed in emergency department.  Fluid bolus given.  Labs, imaging and vitals reviewed.  Patient does not meet the SIRS or Sepsis  criteria.  On repeat exam patient does not have a surgical abdomin and there are no peritoneal signs.  No indication of appendicitis, bowel obstruction, bowel perforation, cholecystitis, diverticulitis, PID or ectopic pregnancy.  Suspect patient's symptoms are likely due to viral gastroenteritis.  Patient discharged home with Zofran for symptomatic treatment and given strict instructions for follow-up with their primary care physician.  I have also discussed reasons to return immediately to the ER.  Patient expresses understanding and agrees with plan.  Patient discharged in stable condition.   Final Clinical Impression(s) / ED Diagnoses Final diagnoses:  Nausea vomiting and diarrhea    Rx / DC Orders ED Discharge Orders          Ordered    ondansetron (ZOFRAN-ODT) 4 MG disintegrating tablet  Every 8 hours PRN        02/19/22 0004          An After Visit Summary was printed and given to the patient.     Nestor Lewandowsky 02/19/22 Carlos American, MD 02/19/22 1453

## 2022-02-19 MED ORDER — ONDANSETRON 4 MG PO TBDP: 4.0000 mg | ORAL_TABLET | Freq: Three times a day (TID) | ORAL | 0 refills | Status: DC | PRN

## 2022-02-19 NOTE — Discharge Instructions (Signed)
As we discussed, your workup in the ER today was reassuring for acute findings.  Laboratory evaluation and CT imaging did not reveal any emergent concerns.  I have given you a prescription for Zofran which is a nausea medication for you to take for any residual symptoms.  I recommend that you follow-up with your primary care doctor in the next few days for continued evaluation and management of your symptoms.  Return if development of any new or worsening symptoms.

## 2022-04-08 ENCOUNTER — Encounter: Payer: Self-pay | Admitting: Interventional Cardiology

## 2022-04-08 ENCOUNTER — Ambulatory Visit: Payer: Medicare Other | Attending: Interventional Cardiology | Admitting: Interventional Cardiology

## 2022-04-08 VITALS — BP 142/72 | HR 83 | Ht 62.5 in | Wt 273.0 lb

## 2022-04-08 DIAGNOSIS — I779 Disorder of arteries and arterioles, unspecified: Secondary | ICD-10-CM

## 2022-04-08 DIAGNOSIS — D6869 Other thrombophilia: Secondary | ICD-10-CM

## 2022-04-08 DIAGNOSIS — I480 Paroxysmal atrial fibrillation: Secondary | ICD-10-CM | POA: Diagnosis not present

## 2022-04-08 DIAGNOSIS — I10 Essential (primary) hypertension: Secondary | ICD-10-CM | POA: Diagnosis not present

## 2022-04-08 DIAGNOSIS — I2510 Atherosclerotic heart disease of native coronary artery without angina pectoris: Secondary | ICD-10-CM | POA: Diagnosis not present

## 2022-04-08 DIAGNOSIS — E782 Mixed hyperlipidemia: Secondary | ICD-10-CM

## 2022-04-08 NOTE — Patient Instructions (Signed)
Medication Instructions:  Your physician recommends that you continue on your current medications as directed. Please refer to the Current Medication list given to you today.  *If you need a refill on your cardiac medications before your next appointment, please call your pharmacy*   Lab Work: none If you have labs (blood work) drawn today and your tests are completely normal, you will receive your results only by: [place] (if you have MyChart) OR A paper copy in the mail If you have any lab test that is abnormal or we need to change your treatment, we will call you to review the results.   Testing/Procedures: none   Follow-Up: At [facility], you and your health needs are our priority.  As part of our continuing mission to provide you with exceptional heart care, we have created designated Provider Care Teams.  These Care Teams include your primary Cardiologist (physician) and Advanced Practice Providers (APPs -  Physician Assistants and Nurse Practitioners) who all work together to provide you with the care you need, when you need it.  We recommend signing up for the patient portal called "MyChart".  Sign up information is provided on this After Visit Summary.  MyChart is used to connect with patients for Virtual Visits (Telemedicine).  Patients are able to view lab/test results, encounter notes, upcoming appointments, etc.  Non-urgent messages can be sent to your provider as well.   To learn more about what you can do with MyChart, go to [email or web address].    Your next appointment:   9 month(s)  Provider:   Larae Grooms, MD     Other Instructions
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
## 2022-04-20 ENCOUNTER — Other Ambulatory Visit: Payer: Self-pay | Admitting: Pharmacist Clinician (PhC)/ Clinical Pharmacy Specialist

## 2022-04-20 MED ORDER — APIXABAN 5 MG PO TABS: 5.0000 mg | ORAL_TABLET | Freq: Two times a day (BID) | ORAL | 1 refills | Status: DC

## 2022-06-01 ENCOUNTER — Telehealth: Payer: Self-pay | Admitting: Interventional Cardiology

## 2022-06-01 ENCOUNTER — Other Ambulatory Visit: Payer: Self-pay | Admitting: Interventional Cardiology

## 2022-06-01 MED ORDER — FUROSEMIDE 40 MG PO TABS: 80.0000 mg | ORAL_TABLET | Freq: Every day | ORAL | 3 refills | Status: AC

## 2022-06-01 NOTE — Telephone Encounter (Signed)
Pt's medication was sent to pt's pharmacy as requested. Confirmation received.  °

## 2022-06-01 NOTE — Telephone Encounter (Signed)
*  STAT* If patient is at the pharmacy, call can be transferred to refill team.   1. Which medications need to be refilled? (please list name of each medication and dose if known) furosemide (LASIX) 40 MG tablet   Take 2 tablets by mouth once daily    2. Which pharmacy/location (including street and city if local pharmacy) is medication to be sent to?[facility] - [place], [place] - [address]   3. Do they need a 30 day or 90 day supply? 90 Day Supply  Pt is out of the medication

## 2022-06-05 ENCOUNTER — Telehealth: Payer: Self-pay | Admitting: Interventional Cardiology

## 2022-06-05 ENCOUNTER — Other Ambulatory Visit: Payer: Self-pay | Admitting: Interventional Cardiology

## 2022-06-05 MED ORDER — LISINOPRIL 40 MG PO TABS: 40.0000 mg | ORAL_TABLET | Freq: Every day | ORAL | 3 refills | Status: DC

## 2022-06-05 NOTE — Telephone Encounter (Signed)
Pt's medication was sent to pt's pharmacy as requested. Confirmation received.  °

## 2022-06-05 NOTE — Telephone Encounter (Signed)
*  STAT* If patient is at the pharmacy, call can be transferred to refill team.   1. Which medications need to be refilled? (please list name of each medication and dose if known) lisinopril (ZESTRIL) 40 MG tablet   2. Which pharmacy/location (including street and city if local pharmacy) is medication to be sent to?   [facility] - [place], [place] - [address]    3. Do they need a 30 day or 90 day supply? 90    Pt forgot to call this medication in and has run out

## 2022-06-13 ENCOUNTER — Emergency Department (HOSPITAL_COMMUNITY)
Admission: EM | Admit: 2022-06-13 | Discharge: 2022-06-13 | Disposition: A | Discharge status: Home / Self Care | Payer: Medicare Other | Attending: Emergency Medicine | Admitting: Emergency Medicine

## 2022-06-13 ENCOUNTER — Encounter (HOSPITAL_COMMUNITY): Payer: Self-pay

## 2022-06-13 ENCOUNTER — Emergency Department (HOSPITAL_COMMUNITY): Payer: Medicare Other

## 2022-06-13 ENCOUNTER — Other Ambulatory Visit: Payer: Self-pay

## 2022-06-13 DIAGNOSIS — R319 Hematuria, unspecified: Secondary | ICD-10-CM | POA: Diagnosis not present

## 2022-06-13 DIAGNOSIS — I2510 Atherosclerotic heart disease of native coronary artery without angina pectoris: Secondary | ICD-10-CM | POA: Diagnosis not present

## 2022-06-13 DIAGNOSIS — Z8673 Personal history of transient ischemic attack (TIA), and cerebral infarction without residual deficits: Secondary | ICD-10-CM | POA: Insufficient documentation

## 2022-06-13 DIAGNOSIS — N390 Urinary tract infection, site not specified: Secondary | ICD-10-CM | POA: Insufficient documentation

## 2022-06-13 DIAGNOSIS — E119 Type 2 diabetes mellitus without complications: Secondary | ICD-10-CM | POA: Insufficient documentation

## 2022-06-13 DIAGNOSIS — J45909 Unspecified asthma, uncomplicated: Secondary | ICD-10-CM | POA: Insufficient documentation

## 2022-06-13 DIAGNOSIS — Z794 Long term (current) use of insulin: Secondary | ICD-10-CM | POA: Diagnosis not present

## 2022-06-13 DIAGNOSIS — I10 Essential (primary) hypertension: Secondary | ICD-10-CM | POA: Insufficient documentation

## 2022-06-13 DIAGNOSIS — Z7982 Long term (current) use of aspirin: Secondary | ICD-10-CM | POA: Insufficient documentation

## 2022-06-13 DIAGNOSIS — R1032 Left lower quadrant pain: Secondary | ICD-10-CM | POA: Diagnosis present

## 2022-06-13 DIAGNOSIS — Z79899 Other long term (current) drug therapy: Secondary | ICD-10-CM | POA: Insufficient documentation

## 2022-06-13 DIAGNOSIS — Z7901 Long term (current) use of anticoagulants: Secondary | ICD-10-CM | POA: Diagnosis not present

## 2022-06-13 DIAGNOSIS — Z87891 Personal history of nicotine dependence: Secondary | ICD-10-CM | POA: Diagnosis not present

## 2022-06-13 LAB — URINALYSIS, ROUTINE W REFLEX MICROSCOPIC
Bilirubin Urine: NEGATIVE
Glucose, UA: NEGATIVE mg/dL
Ketones, ur: NEGATIVE mg/dL
Nitrite: NEGATIVE
Protein, ur: 30 mg/dL — AB
Specific Gravity, Urine: 1.016 (ref 1.005–1.030)
pH: 5 (ref 5.0–8.0)

## 2022-06-13 LAB — CBC
HCT: 37.4 % (ref 36.0–46.0)
Hemoglobin: 12.9 g/dL (ref 12.0–15.0)
MCH: 27.3 pg (ref 26.0–34.0)
MCHC: 34.5 g/dL (ref 30.0–36.0)
MCV: 79.1 fL — ABNORMAL LOW (ref 80.0–100.0)
Platelets: 250 10*3/uL (ref 150–400)
RBC: 4.73 MIL/uL (ref 3.87–5.11)
RDW: 14.6 % (ref 11.5–15.5)
WBC: 6.6 10*3/uL (ref 4.0–10.5)
nRBC: 0 % (ref 0.0–0.2)

## 2022-06-13 LAB — COMPREHENSIVE METABOLIC PANEL
ALT: 22 U/L (ref 0–44)
AST: 24 U/L (ref 15–41)
Albumin: 3.7 g/dL (ref 3.5–5.0)
Alkaline Phosphatase: 83 U/L (ref 38–126)
Anion gap: 9 (ref 5–15)
BUN: 11 mg/dL (ref 8–23)
CO2: 25 mmol/L (ref 22–32)
Calcium: 9.1 mg/dL (ref 8.9–10.3)
Chloride: 99 mmol/L (ref 98–111)
Creatinine, Ser: 0.83 mg/dL (ref 0.44–1.00)
GFR, Estimated: >60 mL/min (ref >60)
Glucose, Bld: 165 mg/dL — ABNORMAL HIGH (ref 70–99)
Potassium: 3.8 mmol/L (ref 3.5–5.1)
Sodium: 133 mmol/L — ABNORMAL LOW (ref 135–145)
Total Bilirubin: 1.1 mg/dL (ref 0.3–1.2)
Total Protein: 7.6 g/dL (ref 6.5–8.1)

## 2022-06-13 LAB — LIPASE, BLOOD: Lipase: 29 U/L (ref 11–51)

## 2022-06-13 MED ORDER — HYDROMORPHONE HCL 1 MG/ML IJ SOLN
0.5000 mg | Freq: Once | INTRAMUSCULAR | Status: AC
  Administered 2022-06-13: 0.5 mg via INTRAVENOUS
  Filled 2022-06-13: qty 0.5

## 2022-06-13 MED ORDER — IOHEXOL 300 MG/ML  SOLN
100.0000 mL | Freq: Once | INTRAMUSCULAR | Status: AC | PRN
  Administered 2022-06-13: 100 mL via INTRAVENOUS

## 2022-06-13 MED ORDER — LEVOFLOXACIN 750 MG PO TABS: 750.0000 mg | ORAL_TABLET | Freq: Every day | ORAL | 0 refills | Status: AC

## 2022-06-13 MED ORDER — SODIUM CHLORIDE 0.9 % IV BOLUS
1000.0000 mL | Freq: Once | INTRAVENOUS | Status: AC
  Administered 2022-06-13: 1000 mL via INTRAVENOUS

## 2022-06-13 MED ORDER — LEVOFLOXACIN 750 MG PO TABS
750.0000 mg | ORAL_TABLET | Freq: Once | ORAL | Status: AC
  Administered 2022-06-13: 750 mg via ORAL
  Filled 2022-06-13: qty 1

## 2022-06-13 NOTE — ED Provider Notes (Signed)
[place] EMERGENCY DEPARTMENT AT [facility] Provider Note  CSN: [id number] Arrival date & time: 06/13/22 1423  Chief Complaint(s) Abdominal Pain  HPI [name] is a 67 y.o. female with history of diabetes, paroxysmal A-fib on Eliquis,, coronary artery disease presenting with abdominal pain.  She reports abdominal pain in the left lower and left upper quadrant radiating to the left flank for 3 days, worse past 2 days.  She also reports some urinary frequency and urgency but no dysuria.  No fevers or chills.  No nausea or vomiting.  No diarrhea.  No constipation.  She does report history of kidney stone and feels somewhat similar.   Past Medical History Past Medical History:  Diagnosis Date   Arthritis    Asthma    Coronary artery disease    Diabetes mellitus    Diabetic neuropathy (HCC)    GERD (gastroesophageal reflux disease)    History of hiatal hernia    Hx of cardiovascular stress test    Lexiscan Myoview (10/15):  Medium area of ischemia in AL and IL distribution, cannot completely exclude shifting breast attenuation, EF 64%; Abnormal study   Hyperlipidemia    Hypertension    Mild carotid artery disease (HCC)    Morbid obesity (HCC)    PAF (paroxysmal atrial fibrillation) (HCC)    Sleep apnea    does not wear CPAP   Stroke (cerebrum) (HCC)    Syncope    Patient Active Problem List   Diagnosis Date Noted   Paroxysmal atrial fibrillation with RVR (HCC) 11/28/2020   Hypertensive urgency 11/28/2020   Syncope and collapse 11/28/2020   Nausea 11/28/2020   Hypokalemia 11/28/2020   Hyperglycemia due to diabetes mellitus (HCC) 11/28/2020   GERD (gastroesophageal reflux disease) 11/28/2020   Asthma 11/28/2020   Coronary atherosclerosis of native coronary artery 07/17/2013   Mixed hyperlipidemia 07/17/2013   Essential hypertension, benign 07/17/2013   Obesity 08/11/2011   Home Medication(s) Prior to Admission medications   Medication Sig Start Date End  Date Taking? Authorizing Provider  furosemide (LASIX) 40 MG tablet Take 1 tablet (40 mg total) by mouth daily. 06/01/22   Corky Crafts, MD  levofloxacin (LEVAQUIN) 750 MG tablet Take 1 tablet (750 mg total) by mouth daily for 7 days. 06/14/22 06/21/22 Yes Lonell Grandchild, MD  acetaminophen (TYLENOL) 325 MG tablet Take 650 mg by mouth as needed for mild pain or moderate pain.    [provider]  albuterol (VENTOLIN HFA) 108 (90 Base) MCG/ACT inhaler Inhale 1-2 puffs into the lungs every 6 (six) hours as needed for wheezing or shortness of breath. 04/16/21   Wallis Bamberg, PA-C  ALPRAZolam Prudy Feeler) 0.5 MG tablet Take 0.25-0.5 mg by mouth 2 (two) times daily as needed for anxiety or sleep. 11/21/20   [provider]  amLODipine (NORVASC) 10 MG tablet Take 1 tablet (10 mg total) by mouth daily. 02/16/22   Corky Crafts, MD  apixaban (ELIQUIS) 5 MG TABS tablet Take 1 tablet (5 mg total) by mouth 2 (two) times daily. 04/20/22   Corky Crafts, MD  Ascorbic Acid (VITAMIN C) 500 MG CAPS 1 capsule Orally Once a day    [provider]  aspirin EC 81 MG tablet Take 1 tablet (81 mg total) by mouth daily. 03/03/19   Corky Crafts, MD  b complex vitamins capsule Take 1 capsule by mouth daily.    [provider]  benzonatate (TESSALON) 100 MG capsule Take 1-2 capsules (100-200  mg total) by mouth 3 (three) times daily as needed for cough. 11/02/21   Tomi Bamberger, PA-C  BREO ELLIPTA 100-25 MCG/INH AEPB Inhale 1 puff into the lungs daily. 09/21/16   [provider]  budesonide-formoterol (SYMBICORT) 80-4.5 MCG/ACT inhaler 2 puffs Inhalation Once a day    [provider]  carvedilol (COREG) 6.25 MG tablet Take 1 tablet by mouth twice daily 07/25/21   Corky Crafts, MD  Cholecalciferol (VITAMIN D-3 PO) Take 2,000 Units by mouth daily.    [provider]  escitalopram (LEXAPRO) 5 MG tablet Take 5 mg by mouth at bedtime. 11/22/20    [provider]  ezetimibe (ZETIA) 10 MG tablet Take 1 tablet by mouth once daily 12/17/21   Corky Crafts, MD  Fexofenadine-Pseudoephedrine (ALLEGRA-D 12 HOUR PO) Allegra-D 12 Hour    [provider]  fluconazole (DIFLUCAN) 150 MG tablet Take one pill at onset of symptoms Patient not taking: Reported on 04/08/2022 11/03/21   Leath-Warren, Sadie Haber, NP  FREESTYLE LITE test strip 1 each by Other route as directed.  07/08/11   [provider]  furosemide (LASIX) 40 MG tablet Take 2 tablets (80 mg total) by mouth daily. 06/01/22   Corky Crafts, MD  HUMULIN R 500 UNIT/ML SOLN injection Inject 30-80 Units into the skin See admin instructions. 80 units in the morning and 30 evening.  Sliding Scale. 70 and below do not take 12/25/13   [provider]  HUMULIN R U-500 KWIKPEN 500 UNIT/ML KwikPen Inject into the skin. 02/03/22   [provider]  HYDROcodone-acetaminophen (NORCO/VICODIN) 5-325 MG tablet Take 1 tablet by mouth every 4 (four) hours as needed. 01/29/22   [provider]  lisinopril (ZESTRIL) 40 MG tablet Take 1 tablet (40 mg total) by mouth daily. 06/05/22   Corky Crafts, MD  nitroGLYCERIN (NITROSTAT) 0.4 MG SL tablet Place 1 tablet (0.4 mg total) under the tongue every 5 (five) minutes as needed for chest pain (X 3 DOSES FOR CHEST PAIN). 11/30/17   Corky Crafts, MD  ondansetron (ZOFRAN-ODT) 4 MG disintegrating tablet Take 1 tablet (4 mg total) by mouth every 8 (eight) hours as needed for nausea or vomiting. 02/19/22   Smoot, Sarah A, PA-C  OZEMPIC, 0.25 OR 0.5 MG/DOSE, 2 MG/1.5ML SOPN Inject 0.25 mg into the skin once a week. 04/02/21   [provider]  pantoprazole (PROTONIX) 40 MG tablet Take 40 mg by mouth daily as needed (Heartburn).    [provider]  potassium chloride (KLOR-CON 10) 10 MEQ tablet Take 1 tablet (10 mEq total) by mouth in the morning and at bedtime. 02/26/21   Corky Crafts, MD  rosuvastatin (CRESTOR) 40 MG tablet Take 1 tablet by mouth once daily 01/05/22   Corky Crafts, MD  Past Surgical History Past Surgical History:  Procedure Laterality Date   ACHILLES TENDON REPAIR  1995   right   BIOPSY  07/22/2021   Procedure: BIOPSY;  Surgeon: Kerin Salen, MD;  Location: [facility];  Service: Gastroenterology;;   COLONOSCOPY     COLONOSCOPY WITH PROPOFOL N/A 07/22/2021   Procedure: COLONOSCOPY WITH PROPOFOL;  Surgeon: Kerin Salen, MD;  Location: [facility];  Service: Gastroenterology;  Laterality: N/A;   DILATION AND CURETTAGE OF UTERUS     ESOPHAGOGASTRODUODENOSCOPY (EGD) WITH PROPOFOL N/A 07/22/2021   Procedure: ESOPHAGOGASTRODUODENOSCOPY (EGD) WITH PROPOFOL;  Surgeon: Kerin Salen, MD;  Location: [facility];  Service: Gastroenterology;  Laterality: N/A;   HAND SURGERY     right   heart stent  2007   PARS PLANA VITRECTOMY Left 03/24/2017   Procedure: PARS PLANA VITRECTOMY WITH 25 GAUGE;  Surgeon: Edmon Crape, MD;  Location: [facility] OR;  Service: Ophthalmology;  Laterality: Left;   POLYPECTOMY  07/22/2021   Procedure: POLYPECTOMY;  Surgeon: Kerin Salen, MD;  Location: [facility];  Service: Gastroenterology;;   UTERINE FIBROID SURGERY  2008   Family History Family History  Problem Relation Age of Onset   Kidney disease Father    Stroke Father    Heart attack Father    Stroke Maternal Grandfather    Stroke Paternal Grandfather    Asthma Other    Hypertension Other    Diabetes Other     Social History Social History   Tobacco Use   Smoking status: Former    Types: Cigarettes    Quit date: 08/07/1982    Years since quitting: 39.8   Smokeless tobacco: Never  Vaping Use   Vaping Use: Never used  Substance Use Topics   Alcohol use: No   Drug use: No   Allergies Bystolic [nebivolol hcl], Crestor [rosuvastatin],  Sulfa antibiotics, Canagliflozin, Cephalexin, Empagliflozin, Meloxicam, Metformin and related, Metformin hcl, Nebivolol hcl, Other, and Sitagliptin  Review of Systems Review of Systems  All other systems reviewed and are negative.   Physical Exam Vital Signs  I have reviewed the triage vital signs BP 133/76   Pulse 68   Temp 98.8 F (37.1 C) (Oral)   Resp 19   Ht 5' 2.5" (1.588 m)   Wt 127 kg   LMP  (LMP Unknown)   SpO2 100%   BMI 50.40 kg/m  Physical Exam Vitals and nursing note reviewed.  Constitutional:      General: She is not in acute distress.    Appearance: She is well-developed. She is obese.  HENT:     Head: Normocephalic and atraumatic.     Mouth/Throat:     Mouth: Mucous membranes are moist.  Eyes:     Pupils: Pupils are equal, round, and reactive to light.  Cardiovascular:     Rate and Rhythm: Normal rate and regular rhythm.     Heart sounds: No murmur heard. Pulmonary:     Effort: Pulmonary effort is normal. No respiratory distress.     Breath sounds: Normal breath sounds.  Abdominal:     General: Abdomen is flat.     Palpations: Abdomen is soft.     Tenderness: There is abdominal tenderness in the left upper quadrant and left lower quadrant. There is left CVA tenderness. There is no right CVA tenderness.  Musculoskeletal:        General: No tenderness.     Right lower leg: No edema.     Left lower leg: No edema.  Skin:  General: Skin is warm and dry.  Neurological:     General: No focal deficit present.     Mental Status: She is alert. Mental status is at baseline.  Psychiatric:        Mood and Affect: Mood normal.        Behavior: Behavior normal.     ED Results and Treatments Labs (all labs ordered are listed, but only abnormal results are displayed) Labs Reviewed  COMPREHENSIVE METABOLIC PANEL - Abnormal; Notable for the following components:      Result Value   Sodium 133 (*)    Glucose, Bld 165 (*)    All other components within  normal limits  CBC - Abnormal; Notable for the following components:   MCV 79.1 (*)    All other components within normal limits  URINALYSIS, ROUTINE W REFLEX MICROSCOPIC - Abnormal; Notable for the following components:   APPearance HAZY (*)    Hgb urine dipstick MODERATE (*)    Protein, ur 30 (*)    Leukocytes,Ua TRACE (*)    Bacteria, UA FEW (*)    All other components within normal limits  URINE CULTURE  LIPASE, BLOOD                                                                                                                          Radiology CT ABDOMEN PELVIS W CONTRAST  Result Date: 06/13/2022 CLINICAL DATA:  Left lower quadrant pain EXAM: CT ABDOMEN AND PELVIS WITH CONTRAST TECHNIQUE: Multidetector CT imaging of the abdomen and pelvis was performed using the standard protocol following bolus administration of intravenous contrast. RADIATION DOSE REDUCTION: This exam was performed according to the departmental dose-optimization program which includes automated exposure control, adjustment of the mA and/or kV according to patient size and/or use of iterative reconstruction technique. CONTRAST:  OMNIPAQUE IOHEXOL 300 MG/ML  SOLN COMPARISON:  CT abdomen and pelvis dated February 18, 2022 FINDINGS: Lower chest: No acute abnormality. Coronary artery calcifications. Elevation of the right hemidiaphragm with associated atelectasis. Hepatobiliary: No focal liver abnormality is seen. No gallstones, gallbladder wall thickening, or biliary dilatation. Pancreas: Unremarkable. No pancreatic ductal dilatation or surrounding inflammatory changes. Spleen: Normal in size without focal abnormality. Adrenals/Urinary Tract: Bilateral adrenal glands are unremarkable. Unchanged minimally complex cyst of the right kidney. Right renal nephrolithiasis, large calyceal stones with associated caliectasis, unchanged when compared with the prior exam. Reference stone of the mid region of the right kidney measuring  14 mm on series 5, image 81. Additional scattered small low-attenuation renal lesions are seen which are too small to accurately characterize but likely simple cysts. Bladder wall thickening. Stomach/Bowel: Stomach is within normal limits. Appendix appears normal. No evidence of bowel wall thickening, distention, or inflammatory changes. Vascular/Lymphatic: Aortic atherosclerosis. No enlarged abdominal or pelvic lymph nodes. Reproductive: Multi fibroid uterus, similar calcified. No adnexal mass. Other: Small fat containing periumbilical hernia. No abdominopelvic ascites. Musculoskeletal: No acute or significant osseous findings. IMPRESSION: 1. No acute findings  in the abdomen or pelvis. 2. Right renal nephrolithiasis, large calyceal stones with associated caliectasis, unchanged when compared with the prior exam. No evidence of hydronephrosis. 3. Minimally complex cyst of the mid region of the right kidney, unchanged when compared with the prior exams dating back to June 04, 2019. Consider follow-up renal protocol MRI or CT 1 year. 4. Mild bladder wall thickening, findings can be seen in the setting of cystitis. Recommend correlation with urinalysis. 5. Aortic Atherosclerosis (ICD10-I70.0). Electronically Signed   By: Allegra Lai M.D.   On: 06/13/2022 17:42    Pertinent labs & imaging results that were available during my care of the patient were reviewed by me and considered in my medical decision making (see MDM for details).  Medications Ordered in ED Medications  levofloxacin (LEVAQUIN) tablet 750 mg (has no administration in time range)  sodium chloride 0.9 % bolus 1,000 mL (1,000 mLs Intravenous New Bag/Given 06/13/22 1636)  HYDROmorphone (DILAUDID) injection 0.5 mg (0.5 mg Intravenous Given 06/13/22 1616)  iohexol (OMNIPAQUE) 300 MG/ML solution 100 mL (100 mLs Intravenous Contrast Given 06/13/22 1708)                                                                                                                                      Procedures Procedures  (including critical care time)  Medical Decision Making / ED Course   MDM:  67 year old female presenting to the emergency department with abdominal pain.  Patient well-appearing, vitals reassuring, no fever.  Exam with some left-sided abdominal pain and left flank pain.  Given age, urinary symptoms, will obtain CT scan to further evaluate.  Differential includes kidney stone, pyelonephritis, diverticulitis, constipation, colitis.  Doubt perforation, no peritoneal signs.  No nausea or vomiting to suggest obstruction.  Doubt vascular catastrophe.  Will reassess.  Will treat pain.  Clinical Course as of 06/13/22 1821  Sat Jun 13, 2022  1819 CT scan shows no acute process other than bladder thickening suggestive of UTI.  Urinalysis does have some blood, WBCs and bacteria.  No obstructing kidney stone.  Will treat with Levaquin as patient allergic to Keflex and Bactrim.  Given flank pain will treat for 7 days to cover for pyelonephritis.  Advise close follow-up with primary physician.  Will send urine culture.  Patient reports that her symptoms are significantly improved. Will discharge patient to home. All questions answered. Patient comfortable with plan of discharge. Return precautions discussed with patient and specified on the after visit summary.  [WS]    Clinical Course User Index [WS] Lonell Grandchild, MD     Additional history obtained: -Additional history obtained from friend -External records from outside source obtained and reviewed including: Chart review including previous notes, labs, imaging, consultation notes including ED visit for diarrhea 02/18/22   Lab Tests: -I ordered, reviewed, and interpreted labs.   The pertinent results include:   Labs Reviewed  COMPREHENSIVE METABOLIC PANEL -  Abnormal; Notable for the following components:      Result Value   Sodium 133 (*)    Glucose, Bld 165 (*)    All other  components within normal limits  CBC - Abnormal; Notable for the following components:   MCV 79.1 (*)    All other components within normal limits  URINALYSIS, ROUTINE W REFLEX MICROSCOPIC - Abnormal; Notable for the following components:   APPearance HAZY (*)    Hgb urine dipstick MODERATE (*)    Protein, ur 30 (*)    Leukocytes,Ua TRACE (*)    Bacteria, UA FEW (*)    All other components within normal limits  URINE CULTURE  LIPASE, BLOOD    Notable for leukocytes and bacturia   Imaging Studies ordered: I ordered imaging studies including CT abdomen pelvis On my interpretation imaging demonstrates bladder thickening, R renal cyst which is old, discussed this with patient who understands need for follow up with PMD I independently visualized and interpreted imaging. I agree with the radiologist interpretation   Medicines ordered and prescription drug management: Meds ordered this encounter  Medications   sodium chloride 0.9 % bolus 1,000 mL   HYDROmorphone (DILAUDID) injection 0.5 mg   iohexol (OMNIPAQUE) 300 MG/ML solution 100 mL   levofloxacin (LEVAQUIN) 750 MG tablet    Sig: Take 1 tablet (750 mg total) by mouth daily for 7 days.    Dispense:  7 tablet    Refill:  0   levofloxacin (LEVAQUIN) tablet 750 mg    -I have reviewed the patients home medicines and have made adjustments as needed  Cardiac Monitoring: The patient was maintained on a cardiac monitor.  I personally viewed and interpreted the cardiac monitored which showed an underlying rhythm of: NSR  Social Determinants of Health:  Diagnosis or treatment significantly limited by social determinants of health: obesity   Reevaluation: After the interventions noted above, I reevaluated the patient and found that their symptoms have improved  Co morbidities that complicate the patient evaluation  Past Medical History:  Diagnosis Date   Arthritis    Asthma    Coronary artery disease    Diabetes mellitus     Diabetic neuropathy (HCC)    GERD (gastroesophageal reflux disease)    History of hiatal hernia    Hx of cardiovascular stress test    Lexiscan Myoview (10/15):  Medium area of ischemia in AL and IL distribution, cannot completely exclude shifting breast attenuation, EF 64%; Abnormal study   Hyperlipidemia    Hypertension    Mild carotid artery disease (HCC)    Morbid obesity (HCC)    PAF (paroxysmal atrial fibrillation) (HCC)    Sleep apnea    does not wear CPAP   Stroke (cerebrum) (HCC)    Syncope       Dispostion: Disposition decision including need for hospitalization was considered, and patient discharged from emergency department.    Final Clinical Impression(s) / ED Diagnoses Final diagnoses:  Urinary tract infection with hematuria, site unspecified     This chart was dictated using voice recognition software.  Despite best efforts to proofread,  errors can occur which can change the documentation meaning.    Lonell Grandchild, MD 06/13/22 Rickey Primus

## 2022-06-13 NOTE — Discharge Instructions (Addendum)
We evaluated you for your urinary symptoms and flank pain.  Your CT scan did not show any dangerous process.  Your CT scan was suggestive of a bladder infection.  You may have an early kidney infection.  We have prescribed you antibiotics.  Please take these as prescribed.  We gave you a first dose of antibiotics in the emergency department so you can pick up your antibiotics tomorrow.  Please follow-up closely with your primary doctor.  You can take at 1000 mg of Tylenol every 6 hours as needed for pain.  If your symptoms worsen or you develop any new symptoms such as high fevers, uncontrolled vomiting, black or bloody stools, severe abdominal pain, or any other concerning symptoms, please return to the emergency department for repeat evaluation.

## 2022-06-13 NOTE — ED Triage Notes (Signed)
Pt presents with intermittent LLQ abd pain that improves after urination x 3 days. Pt reports associated urinary frequency. Denies fever.

## 2022-06-15 LAB — URINE CULTURE

## 2022-07-01 ENCOUNTER — Telehealth: Payer: Self-pay | Admitting: Interventional Cardiology

## 2022-07-01 ENCOUNTER — Other Ambulatory Visit: Payer: Self-pay | Admitting: Interventional Cardiology

## 2022-07-01 MED ORDER — POTASSIUM CHLORIDE ER 10 MEQ PO TBCR: 10.0000 meq | EXTENDED_RELEASE_TABLET | Freq: Two times a day (BID) | ORAL | 3 refills | Status: DC

## 2022-07-01 NOTE — Telephone Encounter (Signed)
Pt's medication was sent to pt's pharmacy as requested. Confirmation received.  °

## 2022-07-01 NOTE — Telephone Encounter (Signed)
*  STAT* If patient is at the pharmacy, call can be transferred to refill team.   1. Which medications need to be refilled? (please list name of each medication and dose if known)   potassium chloride (KLOR-CON 10) 10 MEQ tablet    2. Which pharmacy/location (including street and city if local pharmacy) is medication to be sent to?  [id number] - [place], [place] - [address]    3. Do they need a 30 day or 90 day supply? 90 day    Pt is completely out of medication and missed a dosage yesterday.

## 2022-08-31 DIAGNOSIS — M65342 Trigger finger, left ring finger: Secondary | ICD-10-CM | POA: Insufficient documentation

## 2022-08-31 DIAGNOSIS — G5603 Carpal tunnel syndrome, bilateral upper limbs: Secondary | ICD-10-CM | POA: Insufficient documentation

## 2022-08-31 DIAGNOSIS — M79641 Pain in right hand: Secondary | ICD-10-CM | POA: Insufficient documentation

## 2022-09-11 DIAGNOSIS — G5623 Lesion of ulnar nerve, bilateral upper limbs: Secondary | ICD-10-CM | POA: Insufficient documentation

## 2022-09-11 DIAGNOSIS — E1142 Type 2 diabetes mellitus with diabetic polyneuropathy: Secondary | ICD-10-CM | POA: Insufficient documentation

## 2022-09-29 ENCOUNTER — Other Ambulatory Visit: Payer: Self-pay | Admitting: Interventional Cardiology

## 2022-09-29 ENCOUNTER — Other Ambulatory Visit: Payer: Self-pay

## 2022-09-29 MED ORDER — NITROGLYCERIN 0.4 MG SL SUBL: 0.4000 mg | SUBLINGUAL_TABLET | SUBLINGUAL | 6 refills | Status: AC | PRN

## 2022-09-29 NOTE — Telephone Encounter (Signed)
Prescription refill request for Eliquis received. Indication: AF/CVA Last office visit: 04/08/22  Catalina Gravel MD Scr: 0.83 on 06/13/22  Epic Age: 67 Weight: 123.8kg  Based on above findings Eliquis 5mg  twice daily is the appropriate dose.  Refill approved.

## 2022-09-29 NOTE — Telephone Encounter (Signed)
Pt's medication was sent to pt's pharmacy as requested. Confirmation received.  °

## 2022-11-03 ENCOUNTER — Ambulatory Visit (HOSPITAL_BASED_OUTPATIENT_CLINIC_OR_DEPARTMENT_OTHER): Payer: Medicare Other | Admitting: Cardiology

## 2022-11-03 ENCOUNTER — Encounter (HOSPITAL_BASED_OUTPATIENT_CLINIC_OR_DEPARTMENT_OTHER): Payer: Self-pay | Admitting: Cardiology

## 2022-11-03 VITALS — BP 122/60 | HR 76 | Ht 62.5 in | Wt 270.4 lb

## 2022-11-03 DIAGNOSIS — I10 Essential (primary) hypertension: Secondary | ICD-10-CM

## 2022-11-03 DIAGNOSIS — E669 Obesity, unspecified: Secondary | ICD-10-CM

## 2022-11-03 DIAGNOSIS — I480 Paroxysmal atrial fibrillation: Secondary | ICD-10-CM | POA: Diagnosis not present

## 2022-11-03 DIAGNOSIS — I2510 Atherosclerotic heart disease of native coronary artery without angina pectoris: Secondary | ICD-10-CM | POA: Diagnosis not present

## 2022-11-03 DIAGNOSIS — E1169 Type 2 diabetes mellitus with other specified complication: Secondary | ICD-10-CM

## 2022-11-03 DIAGNOSIS — Z8673 Personal history of transient ischemic attack (TIA), and cerebral infarction without residual deficits: Secondary | ICD-10-CM

## 2022-11-03 NOTE — Progress Notes (Signed)
Cardiology Office Note:  .   Date:  11/03/2022  ID:  [name], DOB 1955-10-12, MRN [id number] PCP: Merri Brunette, MD  [facility] Providers Cardiologist:  Jodelle Red, MD {  History of Present Illness: .   [name] is a 67 y.o. female with PMH CAD, obesity, OSA, paroxysmal atrial fibrillation, diabetes type II. She established care with me on 11/03/22, previously followed by Dr. Eldridge Dace.  Pertinent CV history: Most recent cath 2012 with patent LAD stent, placed 06/2010. CVA in 11/2020.  Today: Feels short of breath chronically with activity. Can walk about 1/2 mile. Has occasional upper epigastric pain, mild, nonradiating. Does not have associated symptoms at that time. Changes with palpation. She feels this is gas.  ROS: Denies chest pain, shortness of breath at rest or with normal exertion. No PND, orthopnea, LE edema or unexpected weight gain. No syncope or palpitations. ROS otherwise negative except as noted.   Studies Reviewed: Marland Kitchen    EKG:       Physical Exam:   VS:  BP 122/60   Pulse 76   Ht 5' 2.5" (1.588 m)   Wt 270 lb 6.4 oz (122.7 kg)   LMP  (LMP Unknown)   SpO2 95%   BMI 48.67 kg/m    Wt Readings from Last 3 Encounters:  11/03/22 270 lb 6.4 oz (122.7 kg)  06/13/22 280 lb (127 kg)  04/08/22 273 lb (123.8 kg)    GEN: Well nourished, well developed in no acute distress HEENT: Normal, moist mucous membranes NECK: No JVD CARDIAC: regular rhythm, normal S1 and S2, no rubs or gallops. 1/6 systolic murmur. VASCULAR: Radial and DP pulses 2+ bilaterally. No carotid bruits RESPIRATORY:  Clear to auscultation without rales, wheezing or rhonchi  ABDOMEN: Soft, non-tender, non-distended MUSCULOSKELETAL:  Ambulates independently SKIN: Warm and dry, no edema NEUROLOGIC:  Alert and oriented x 3. No focal neuro deficits noted. PSYCHIATRIC:  Normal affect    ASSESSMENT AND PLAN: .    CAD with stent 2012 Hypercholesterolemia -last LDL 58 -on  aspirin, rosuvastatin, ezetimibe -on carvedilol as anti anginal -reviewed red flag warning signs that need immediate medical attention  CVA 2022 Paroxysmal atrial fibrillation -CHA2DS2/VAS Stroke Risk Points=7  -on apixaban  Morbid obesity, BMI 48 Type II diabetes -on GLP1RA -did not tolerate SGLT2i due to UTI -on insulin long term  CV risk counseling and prevention -recommend heart healthy/Mediterranean diet, with whole grains, fruits, vegetable, fish, lean meats, nuts, and olive oil. Limit salt. -recommend moderate walking, 3-5 times/week for 30-50 minutes each session. Aim for at least 150 minutes.week. Goal should be pace of 3 miles/hours, or walking 1.5 miles in 30 minutes -recommend avoidance of tobacco products. Avoid excess alcohol.  Dispo: 6 mos  Signed, Jodelle Red, MD   Jodelle Red, MD, PhD, [facility]  [facility]  [place]  [facility] at [facility] at [facility] [address] [place], [place] [address] [phone]

## 2022-11-03 NOTE — Patient Instructions (Signed)
Medication Instructions:  Your physician recommends that you continue on your current medications as directed. Please refer to the Current Medication list given to you today.   Follow-Up: At [facility], you and your health needs are our priority.  As part of our continuing mission to provide you with exceptional heart care, we have created designated Provider Care Teams.  These Care Teams include your primary Cardiologist (physician) and Advanced Practice Providers (APPs -  Physician Assistants and Nurse Practitioners) who all work together to provide you with the care you need, when you need it.  We recommend signing up for the patient portal called "MyChart".  Sign up information is provided on this After Visit Summary.  MyChart is used to connect with patients for Virtual Visits (Telemedicine).  Patients are able to view lab/test results, encounter notes, upcoming appointments, etc.  Non-urgent messages can be sent to your provider as well.   To learn more about what you can do with MyChart, go to [email or web address].    Your next appointment:   6 months with Dr. Cristal Deer

## 2022-12-24 DIAGNOSIS — M545 Low back pain, unspecified: Secondary | ICD-10-CM | POA: Insufficient documentation

## 2022-12-24 DIAGNOSIS — M25562 Pain in left knee: Secondary | ICD-10-CM | POA: Insufficient documentation

## 2023-01-14 ENCOUNTER — Encounter: Payer: Self-pay | Admitting: Neurology

## 2023-01-14 ENCOUNTER — Ambulatory Visit (INDEPENDENT_AMBULATORY_CARE_PROVIDER_SITE_OTHER): Payer: Medicare Other | Admitting: Neurology

## 2023-01-14 VITALS — BP 158/79 | HR 78 | Ht 62.0 in | Wt 282.0 lb

## 2023-01-14 DIAGNOSIS — I6389 Other cerebral infarction: Secondary | ICD-10-CM | POA: Diagnosis not present

## 2023-01-14 NOTE — Patient Instructions (Signed)
Continue current medications Continue with exercise plan at the [facility] Consider removing the TV for from the bedroom to help with sleep Please follow-up with your sleep doctor for a possible repeat sleep study Consider using wrist braces for carpal tunnel syndrome Continue to follow with PCP return in 1 year or sooner if worse.

## 2023-01-14 NOTE — Progress Notes (Signed)
[facility]  PATIENT: [name] DOB: Jun 08, 1955  REQUESTING CLINICIAN: Merri Brunette, MD HISTORY FROM: Patient and niece  REASON FOR VISIT: Left occipital stroke follow up.    HISTORICAL  CHIEF COMPLAINT:  Chief Complaint  Patient presents with   Follow-up    Rm 12. Patient with Radene Journey. Patient reports having no energy still and going up and down. Joined a local gym following PT but says she cannot tell much of a difference yet, but does feel better. States she still stumbles sometimes and feels off balance   INTERVAL HISTORY 01/14/2023:  Patient presents today for follow-up, she is accompanied by her friend Aggie Cosier.  Last visit was a year ago, since then she has been doing well, denies any strokelike symptoms.  She reports experiencing some trouble with balance, has completed physical therapy and now she is doing exercise at the [facility]. She was diagnosed with carpal tunnel syndrome, was recommended to wrist brace but she does not use it consistently. She also reports trouble with sleep, she has difficulty sleeping due to watching TV in her bedroom, usually wakes up in the afternoon.  She does have a sleep apnea but does not use her CPAP machine.   INTERVAL HISTORY 01/22/2022:  Patient presents today for follow-up, she is accompanied by her niece.  Last visit was a year ago.  At that time we had continue all the medications.  She is on Eliquis for atrial fibrillation and still on aspirin for his cardiac stents.  Denies any current deficits.  She is ambulatory but state that she is not as active as she would want.  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  Enjoys her work.  Reported she is somehow forgetful but other than that she is doing well.  Again no deficit from a stroke. She denies any new seizure or seizure-like activity.  Her previous EEG showed left posterior quadrant slowing which is consistent with the area where she had  her stroke.   HISTORY OF PRESENT ILLNESS:  This is a 67 year old woman with past medical history of atrial fibrillation, hypertension, hyperlipidemia, obesity, diabetes mellitus type 2, anxiety and asthma who is presenting after recent admission for facial droop and syncope and found to have a 3mm left occipital stroke.  Patient was at her psychiatrist office when she started complaining of tingling of left side of face and around her left eye who directed her to the ED.  In the ED she was noted to be hypertensive in the 190 systolic and reported had a syncopal episode with urinary incontinence when the phlebotomist entered the room.  She was found to be in A. Fib with RVR and had a MRI brain which showed a 3 mm acute left occipital stroke.  She was noted to have no neurological deficit from the left acute stroke.  She had a carotid ultrasound which did not show any high-grade stenosis, her echocardiogram showed normal ventricular function no thrombus noted.  She was started on aspirin and Eliquis.  Her A1c was 9 and she was started on insulin.  Patient report prior to going to the hospital she stopped taking her aspirin and Plavix (she had cardiac stents) but on discharge was also was only put back on aspirin no Plavix.  She is supposed to follow-up with a cardiologist in 2 days.   Since discharge, she has been complaining of blurred vision, nausea but no vomiting. She also reports increase  anxiety.    OTHER MEDICAL CONDITIONS: Atrial fibrillation, HTN, HLD, Obesity, Anxiety DMII, and Asthma    REVIEW OF SYSTEMS: Full 14 system review of systems performed and negative with exception of: as noted in the HPI   ALLERGIES: Allergies  Allergen Reactions   Bystolic [Nebivolol Hcl] Palpitations   Crestor [Rosuvastatin] Other (See Comments)    Couldn't tolerate Crestor 40 mg dose    Sulfa Antibiotics Anaphylaxis   Canagliflozin Other (See Comments)    Other reaction(s): yeast infection   Cephalexin      Other reaction(s): stomach upset   Empagliflozin     Other reaction(s): yeast infection   Meloxicam     Other reaction(s): palpitations   Metformin And Related Diarrhea   Metformin Hcl     Other reaction(s): stomach upset   Nebivolol Hcl     Other reaction(s): syncope   Other     L[employer] anything that has this in it makes her wheeze   Sitagliptin     Other reaction(s): ??    HOME MEDICATIONS: Outpatient Medications Prior to Visit  Medication Sig Dispense Refill   acetaminophen (TYLENOL) 325 MG tablet Take 650 mg by mouth as needed for mild pain or moderate pain.     albuterol (VENTOLIN HFA) 108 (90 Base) MCG/ACT inhaler Inhale 1-2 puffs into the lungs every 6 (six) hours as needed for wheezing or shortness of breath. 18 g 0   ALPRAZolam (XANAX) 0.5 MG tablet Take 0.25-0.5 mg by mouth 2 (two) times daily as needed for anxiety or sleep.     amLODipine (NORVASC) 10 MG tablet Take 1 tablet (10 mg total) by mouth daily. 90 tablet 3   apixaban (ELIQUIS) 5 MG TABS tablet Take 1 tablet by mouth twice daily 180 tablet 1   Ascorbic Acid (VITAMIN C) 500 MG CAPS 1 capsule Orally Once a day     aspirin EC 81 MG tablet Take 1 tablet (81 mg total) by mouth daily. 90 tablet 3   b complex vitamins capsule Take 1 capsule by mouth daily.     benzonatate (TESSALON) 100 MG capsule Take 1-2 capsules (100-200 mg total) by mouth 3 (three) times daily as needed for cough. 30 capsule 0   BREO ELLIPTA 100-25 MCG/INH AEPB Inhale 1 puff into the lungs daily.     carvedilol (COREG) 6.25 MG tablet Take 1 tablet by mouth twice daily 180 tablet 2   Cholecalciferol (VITAMIN D-3 PO) Take 2,000 Units by mouth daily.     escitalopram (LEXAPRO) 5 MG tablet Take 5 mg by mouth at bedtime.     ezetimibe (ZETIA) 10 MG tablet Take 1 tablet by mouth once daily 90 tablet 2   fexofenadine (ALLEGRA ALLERGY) 180 MG tablet as needed for allergies.     Fexofenadine-Pseudoephedrine (ALLEGRA-D 12 HOUR PO) Allegra-D 12 Hour      FREESTYLE LITE test strip 1 each by Other route as directed.      furosemide (LASIX) 40 MG tablet Take 2 tablets (80 mg total) by mouth daily. 180 tablet 3   HUMULIN R 500 UNIT/ML SOLN injection Inject 30-80 Units into the skin See admin instructions. 80 units in the morning and 30 evening.  Sliding Scale. 70 and below do not take     lisinopril (ZESTRIL) 40 MG tablet Take 1 tablet (40 mg total) by mouth daily. 90 tablet 3   nitroGLYCERIN (NITROSTAT) 0.4 MG SL tablet Place 1 tablet (0.4 mg total) under the tongue every 5 (five) minutes as  needed for chest pain (X 3 DOSES FOR CHEST PAIN). 25 tablet 6   ondansetron (ZOFRAN-ODT) 4 MG disintegrating tablet Take 1 tablet (4 mg total) by mouth every 8 (eight) hours as needed for nausea or vomiting. 20 tablet 0   OZEMPIC, 0.25 OR 0.5 MG/DOSE, 2 MG/1.5ML SOPN Inject 0.25 mg into the skin once a week.     potassium chloride (KLOR-CON 10) 10 MEQ tablet Take 1 tablet (10 mEq total) by mouth in the morning and at bedtime. 180 tablet 3   rosuvastatin (CRESTOR) 40 MG tablet Take 1 tablet by mouth once daily 90 tablet 2   HYDROcodone-acetaminophen (NORCO/VICODIN) 5-325 MG tablet Take 1 tablet by mouth every 4 (four) hours as needed. (Patient not taking: Reported on 01/14/2023)     pantoprazole (PROTONIX) 40 MG tablet Take 40 mg by mouth daily as needed (Heartburn). (Patient not taking: Reported on 01/14/2023)     No facility-administered medications prior to visit.    PAST MEDICAL HISTORY: Past Medical History:  Diagnosis Date   Arthritis    Asthma    Coronary artery disease    Diabetes mellitus    Diabetic neuropathy (HCC)    GERD (gastroesophageal reflux disease)    History of hiatal hernia    Hx of cardiovascular stress test    Lexiscan Myoview (10/15):  Medium area of ischemia in AL and IL distribution, cannot completely exclude shifting breast attenuation, EF 64%; Abnormal study   Hyperlipidemia    Hypertension    Mild carotid artery disease (HCC)     Morbid obesity (HCC)    PAF (paroxysmal atrial fibrillation) (HCC)    Sleep apnea    does not wear CPAP   Stroke (cerebrum) (HCC)    Syncope     PAST SURGICAL HISTORY: Past Surgical History:  Procedure Laterality Date   ACHILLES TENDON REPAIR  1995   right   BIOPSY  07/22/2021   Procedure: BIOPSY;  Surgeon: Kerin Salen, MD;  Location: [facility];  Service: Gastroenterology;;   COLONOSCOPY     COLONOSCOPY WITH PROPOFOL N/A 07/22/2021   Procedure: COLONOSCOPY WITH PROPOFOL;  Surgeon: Kerin Salen, MD;  Location: [facility];  Service: Gastroenterology;  Laterality: N/A;   DILATION AND CURETTAGE OF UTERUS     ESOPHAGOGASTRODUODENOSCOPY (EGD) WITH PROPOFOL N/A 07/22/2021   Procedure: ESOPHAGOGASTRODUODENOSCOPY (EGD) WITH PROPOFOL;  Surgeon: Kerin Salen, MD;  Location: [facility];  Service: Gastroenterology;  Laterality: N/A;   HAND SURGERY     right   heart stent  2007   PARS PLANA VITRECTOMY Left 03/24/2017   Procedure: PARS PLANA VITRECTOMY WITH 25 GAUGE;  Surgeon: Edmon Crape, MD;  Location: [facility] OR;  Service: Ophthalmology;  Laterality: Left;   POLYPECTOMY  07/22/2021   Procedure: POLYPECTOMY;  Surgeon: Kerin Salen, MD;  Location: [facility];  Service: Gastroenterology;;   UTERINE FIBROID SURGERY  2008    FAMILY HISTORY: Family History  Problem Relation Age of Onset   Kidney disease Father    Stroke Father    Heart attack Father    Stroke Maternal Grandfather    Stroke Paternal Grandfather    Asthma Other    Hypertension Other    Diabetes Other     SOCIAL HISTORY: Social History   Socioeconomic History   Marital status: Single    Spouse name: Not on file   Number of children: Not on file   Years of education: Not on file   Highest education level: Not on file  Occupational History  Not on file  Tobacco Use   Smoking status: Former    Current packs/day: 0.00    Types: Cigarettes    Quit date: 08/07/1982    Years since quitting: 40.4   Smokeless  tobacco: Never  Vaping Use   Vaping status: Never Used  Substance and Sexual Activity   Alcohol use: No   Drug use: No   Sexual activity: Not on file  Other Topics Concern   Not on file  Social History Narrative   Not on file   Social Determinants of Health   Financial Resource Strain: Not on file  Food Insecurity: Not on file  Transportation Needs: Not on file  Physical Activity: Not on file  Stress: Not on file  Social Connections: Not on file  Intimate Partner Violence: Not At Risk (08/01/2022)   Received from [facility]   Abuse Screen    Feels Unsafe at Home or Work/School: no    Feels Threatened by Someone: no    Does Anyone Try to Keep You From Having Contact with Others or Doing Things Outside Your Home?: no    Physical Signs of Abuse Present: no     PHYSICAL EXAM  GENERAL EXAM/CONSTITUTIONAL: Vitals:  Vitals:   01/14/23 0903  BP: (!) 158/79  Pulse: 78  Weight: 282 lb (127.9 kg)  Height: 5\' 2"  (1.575 m)    Body mass index is 51.58 kg/m. Wt Readings from Last 3 Encounters:  01/14/23 282 lb (127.9 kg)  11/03/22 270 lb 6.4 oz (122.7 kg)  06/13/22 280 lb (127 kg)   Patient is in no distress; well developed, nourished and groomed; neck is supple, obese woman   EYES: Visual fields full to confrontation, Extraocular movements intacts,   MUSCULOSKELETAL: Gait, strength, tone, movements noted in Neurologic exam below  NEUROLOGIC: MENTAL STATUS:      No data to display         awake, alert, oriented to person, place and time recent and remote memory intact normal attention and concentration language fluent, comprehension intact, naming intact fund of knowledge appropriate  CRANIAL NERVE:  2nd, 3rd, 4th, 6th - visual fields full to confrontation, extraocular muscles intact, no nystagmus 5th - facial sensation symmetric 7th - facial strength symmetric 8th - hearing intact 9th - palate elevates symmetrically, uvula  midline 11th - shoulder shrug symmetric 12th - tongue protrusion midline  MOTOR:  normal bulk and tone, full strength in the BUE, BLE  SENSORY:  normal and symmetric to light touch  COORDINATION:  finger-nose-finger, fine finger movements normal  GAIT/STATION:  normal   DIAGNOSTIC DATA (LABS, IMAGING, TESTING) - I reviewed patient records, labs, notes, testing and imaging myself where available.  Lab Results  Component Value Date   WBC 6.6 06/13/2022   HGB 12.9 06/13/2022   HCT 37.4 06/13/2022   MCV 79.1 (L) 06/13/2022   PLT 250 06/13/2022      Component Value Date/Time   NA 133 (L) 06/13/2022 1525   NA 138 03/11/2020 0737   K 3.8 06/13/2022 1525   CL 99 06/13/2022 1525   CO2 25 06/13/2022 1525   GLUCOSE 165 (H) 06/13/2022 1525   BUN 11 06/13/2022 1525   BUN 12 03/11/2020 0737   CREATININE 0.83 06/13/2022 1525   CREATININE 0.69 01/05/2012 1629   CALCIUM 9.1 06/13/2022 1525   PROT 7.6 06/13/2022 1525   PROT 7.2 02/26/2021 0812   ALBUMIN 3.7 06/13/2022 1525   ALBUMIN 4.3 02/26/2021 4403  AST 24 06/13/2022 1525   ALT 22 06/13/2022 1525   ALKPHOS 83 06/13/2022 1525   BILITOT 1.1 06/13/2022 1525   BILITOT 0.5 02/26/2021 0812   GFRNONAA >60 06/13/2022 1525   GFRAA 105 03/11/2020 0737   Lab Results  Component Value Date   CHOL 114 02/26/2021   HDL 37 (L) 02/26/2021   LDLCALC 63 02/26/2021   TRIG 66 02/26/2021   CHOLHDL 3.1 02/26/2021   Lab Results  Component Value Date   HGBA1C 9.0 (H) 11/28/2020   No results found for: "VITAMINB12" Lab Results  Component Value Date   TSH 3.797 11/27/2020    MRI Brain 11/28/2020 3 mm acute infarct left occipital lobe Moderate chronic ischemic changes.  MRI Brain 12/20/2020 No acute intracranial process. No evidence of acute infarction or hemorrhage.  Bilateral carotid US 10/22 Mild bilateral carotid atherosclerosis. Negative for stenosis.nDegree of narrowing less than 50% bilaterally by ultrasound  criteria. Patent antegrade vertebral flow bilaterally  Echo 11/28/20 1. Left ventricular ejection fraction, by estimation, is 60 to 65%. The left ventricle has normal function. The left ventricle has no regional wall motion abnormalities. There is moderate concentric left ventricular hypertrophy. Left ventricular diastolic parameters are consistent with Grade I diastolic dysfunction (impaired relaxation). Elevated left ventricular end-diastolic pressure.  2. RV-RA gradient normal at 27 mmHg suggesting normal RVSP. Right ventricular systolic function is normal. The right ventricular size is normal.  3. The mitral valve is grossly normal. Trivial mitral valve regurgitation.  4. The aortic valve is tricuspid. Aortic valve regurgitation is not visualized. Aortic valve mean gradient measures 5.0 mmHg.  5. Unable to estimate CVP.   Routine EEG 12/31/2020 Left posterior quadrant slowing   ASSESSMENT AND PLAN  67 y.o. year old female with vascular risk factor including diabetes mellitus, hypertension, hyperlipidemia, coronary artery disease and obesity,  left occipital stroke here for follow up.  Overall she is doing well, compliant with her medication, plan will be for patient to continue her physical activity at the [facility], advised her to remove her TV from the bedroom for better sleep hygiene.  Also advised her to follow-up with her sleep doctor for a updated sleep study, she does have sleep apnea, CPAP machine but does not use it.  Also encouraged her to use the braces for her carpal tunnel syndrome.  I will see her in 1 year for follow-up or sooner if worse.   1. Cerebrovascular accident (CVA) due to other mechanism [facility])     Patient Instructions  Continue current medications Continue with exercise plan at the [facility] Consider removing the TV for from the bedroom to help with sleep Please follow-up with your sleep doctor for a possible repeat sleep study Consider using wrist braces for carpal  tunnel syndrome Continue to follow with PCP return in 1 year or sooner if worse.   No orders of the defined types were placed in this encounter.    No orders of the defined types were placed in this encounter.    Return in about 1 year (around 01/14/2024).  I have spent a total of 30 minutes dedicated to this patient today, preparing to see patient, performing a medically appropriate examination and evaluation, ordering tests and/or medications and procedures, and counseling and educating the patient/family/caregiver; independently interpreting result and communicating results to the family/patient/caregiver; and documenting clinical information in the electronic medical record.   Windell Norfolk, MD 01/14/2023, 10:13 AM  [facility] [address] [place], [place] [address] [phone]

## 2023-04-13 ENCOUNTER — Ambulatory Visit
Admission: EM | Admit: 2023-04-13 | Discharge: 2023-04-13 | Disposition: A | Discharge status: Home / Self Care | Attending: Family Medicine | Admitting: Family Medicine

## 2023-04-13 DIAGNOSIS — R10A1 Flank pain, right side: Secondary | ICD-10-CM

## 2023-04-13 DIAGNOSIS — R310 Gross hematuria: Secondary | ICD-10-CM

## 2023-04-13 DIAGNOSIS — R030 Elevated blood-pressure reading, without diagnosis of hypertension: Secondary | ICD-10-CM | POA: Diagnosis not present

## 2023-04-13 DIAGNOSIS — Z87442 Personal history of urinary calculi: Secondary | ICD-10-CM | POA: Diagnosis not present

## 2023-04-13 DIAGNOSIS — R109 Unspecified abdominal pain: Secondary | ICD-10-CM

## 2023-04-13 LAB — POCT URINALYSIS DIP (MANUAL ENTRY)
Glucose, UA: NEGATIVE mg/dL
Ketones, POC UA: NEGATIVE mg/dL
Leukocytes, UA: NEGATIVE
Nitrite, UA: NEGATIVE
Protein Ur, POC: 100 mg/dL — AB
Spec Grav, UA: >=1.03 — AB (ref 1.010–1.025)
Urobilinogen, UA: 1 U/dL
pH, UA: 5.5 (ref 5.0–8.0)

## 2023-04-13 MED ORDER — TAMSULOSIN HCL 0.4 MG PO CAPS: 0.4000 mg | ORAL_CAPSULE | Freq: Every day | ORAL | 0 refills | Status: DC

## 2023-04-13 MED ORDER — TRAMADOL HCL 50 MG PO TABS: 50.0000 mg | ORAL_TABLET | Freq: Two times a day (BID) | ORAL | 0 refills | Status: DC | PRN

## 2023-04-13 NOTE — ED Provider Notes (Signed)
 [facility]    CSN: [id number] Arrival date & time: 04/13/23  1154      History   Chief Complaint No chief complaint on file.   HPI [name] is a 68 y.o. female.   Patient presenting today with 2-week history of progressively worsening right flank pain now radiating to the right lower quadrant.  She has noticed the last few days in addition to her pain significantly increasing she is now having blood in her urine and urinary frequency.  Denies fever, chills, dysuria, nausea, vomiting, bowel or bladder incontinence, saddle anesthesias.  She is so far not tried anything over-the-counter for symptoms.  She has a history of kidney stones and states this feels very similar.  She is currently on anticoagulation for atrial fibrillation.    Past Medical History:  Diagnosis Date   Arthritis    Asthma    Coronary artery disease    Diabetes mellitus    Diabetic neuropathy (HCC)    GERD (gastroesophageal reflux disease)    History of hiatal hernia    Hx of cardiovascular stress test    Lexiscan Myoview (10/15):  Medium area of ischemia in AL and IL distribution, cannot completely exclude shifting breast attenuation, EF 64%; Abnormal study   Hyperlipidemia    Hypertension    Mild carotid artery disease (HCC)    Morbid obesity (HCC)    PAF (paroxysmal atrial fibrillation) (HCC)    Sleep apnea    does not wear CPAP   Stroke (cerebrum) (HCC)    Syncope     Patient Active Problem List   Diagnosis Date Noted   Paroxysmal atrial fibrillation with RVR (HCC) 11/28/2020   Hypertensive urgency 11/28/2020   Syncope and collapse 11/28/2020   Nausea 11/28/2020   Hypokalemia 11/28/2020   Hyperglycemia due to diabetes mellitus (HCC) 11/28/2020   GERD (gastroesophageal reflux disease) 11/28/2020   Asthma 11/28/2020   Coronary atherosclerosis of native coronary artery 07/17/2013   Mixed hyperlipidemia 07/17/2013   Essential hypertension, benign 07/17/2013   Obesity  08/11/2011    Past Surgical History:  Procedure Laterality Date   ACHILLES TENDON REPAIR  1995   right   BIOPSY  07/22/2021   Procedure: BIOPSY;  Surgeon: Kerin Salen, MD;  Location: [facility];  Service: Gastroenterology;;   CATARACT EXTRACTION     COLONOSCOPY     COLONOSCOPY WITH PROPOFOL N/A 07/22/2021   Procedure: COLONOSCOPY WITH PROPOFOL;  Surgeon: Kerin Salen, MD;  Location: [facility];  Service: Gastroenterology;  Laterality: N/A;   DILATION AND CURETTAGE OF UTERUS     ESOPHAGOGASTRODUODENOSCOPY (EGD) WITH PROPOFOL N/A 07/22/2021   Procedure: ESOPHAGOGASTRODUODENOSCOPY (EGD) WITH PROPOFOL;  Surgeon: Kerin Salen, MD;  Location: [facility];  Service: Gastroenterology;  Laterality: N/A;   HAND SURGERY     right   heart stent  2007   PARS PLANA VITRECTOMY Left 03/24/2017   Procedure: PARS PLANA VITRECTOMY WITH 25 GAUGE;  Surgeon: Edmon Crape, MD;  Location: [facility];  Service: Ophthalmology;  Laterality: Left;   POLYPECTOMY  07/22/2021   Procedure: POLYPECTOMY;  Surgeon: Kerin Salen, MD;  Location: [facility];  Service: Gastroenterology;;   UTERINE FIBROID SURGERY  2008    OB History   No obstetric history on file.      Home Medications    Prior to Admission medications   Medication Sig Start Date End Date Taking? Authorizing Provider  tamsulosin (FLOMAX) 0.4 MG CAPS capsule Take 1 capsule (0.4 mg total) by mouth daily. 04/13/23  Yes Particia Nearing, PA-C  traMADol (ULTRAM) 50 MG tablet Take 1 tablet (50 mg total) by mouth every 12 (twelve) hours as needed. 04/13/23  Yes Particia Nearing, PA-C  acetaminophen (TYLENOL) 325 MG tablet Take 650 mg by mouth as needed for mild pain or moderate pain.    [provider]  albuterol (VENTOLIN HFA) 108 (90 Base) MCG/ACT inhaler Inhale 1-2 puffs into the lungs every 6 (six) hours as needed for wheezing or shortness of breath. 04/16/21   Wallis Bamberg, PA-C  ALPRAZolam Prudy Feeler) 0.5 MG tablet Take 0.25-0.5 mg by  mouth 2 (two) times daily as needed for anxiety or sleep. 11/21/20   [provider]  amLODipine (NORVASC) 10 MG tablet Take 1 tablet (10 mg total) by mouth daily. 02/16/22   Corky Crafts, MD  apixaban Everlene Balls) 5 MG TABS tablet Take 1 tablet by mouth twice daily 09/29/22   Corky Crafts, MD  Ascorbic Acid (VITAMIN C) 500 MG CAPS 1 capsule Orally Once a day    [provider]  aspirin EC 81 MG tablet Take 1 tablet (81 mg total) by mouth daily. 03/03/19   Corky Crafts, MD  b complex vitamins capsule Take 1 capsule by mouth daily.    [provider]  benzonatate (TESSALON) 100 MG capsule Take 1-2 capsules (100-200 mg total) by mouth 3 (three) times daily as needed for cough. 11/02/21   Tomi Bamberger, PA-C  BREO ELLIPTA 100-25 MCG/INH AEPB Inhale 1 puff into the lungs daily. 09/21/16   [provider]  carvedilol (COREG) 6.25 MG tablet Take 1 tablet by mouth twice daily 09/29/22   Corky Crafts, MD  Cholecalciferol (VITAMIN D-3 PO) Take 2,000 Units by mouth daily.    [provider]  escitalopram (LEXAPRO) 5 MG tablet Take 5 mg by mouth at bedtime. 11/22/20   [provider]  ezetimibe (ZETIA) 10 MG tablet Take 1 tablet by mouth once daily 09/29/22   Corky Crafts, MD  fexofenadine [facility] ALLERGY) 180 MG tablet as needed for allergies.    [provider]  Fexofenadine-Pseudoephedrine (ALLEGRA-D 12 HOUR PO) Allegra-D 12 Hour    [provider]  FREESTYLE LITE test strip 1 each by Other route as directed.  07/08/11   [provider]  furosemide (LASIX) 40 MG tablet Take 2 tablets (80 mg total) by mouth daily. 06/01/22   Corky Crafts, MD  HUMULIN R 500 UNIT/ML SOLN injection Inject 30-80 Units into the skin See admin instructions. 80 units in the morning and 30 evening.  Sliding Scale. 70 and below do not take 12/25/13   [provider]  HYDROcodone-acetaminophen  (NORCO/VICODIN) 5-325 MG tablet Take 1 tablet by mouth every 4 (four) hours as needed. Patient not taking: Reported on 01/14/2023 01/29/22   [provider]  lisinopril (ZESTRIL) 40 MG tablet Take 1 tablet (40 mg total) by mouth daily. 06/05/22   Corky Crafts, MD  nitroGLYCERIN (NITROSTAT) 0.4 MG SL tablet Place 1 tablet (0.4 mg total) under the tongue every 5 (five) minutes as needed for chest pain (X 3 DOSES FOR CHEST PAIN). 09/29/22   Corky Crafts, MD  ondansetron (ZOFRAN-ODT) 4 MG disintegrating tablet Take 1 tablet (4 mg total) by mouth every 8 (eight) hours as needed for nausea or vomiting. 02/19/22   Smoot, Sarah A, PA-C  OZEMPIC, 0.25 OR 0.5 MG/DOSE, 2 MG/1.5ML SOPN Inject 0.25 mg into the skin once a week. 04/02/21   [provider]  pantoprazole (PROTONIX) 40 MG tablet Take 40 mg by mouth daily as needed (Heartburn). Patient not taking: Reported on 01/14/2023    [provider]  potassium chloride (KLOR-CON 10) 10 MEQ tablet Take 1 tablet (10 mEq total) by mouth in the morning and at bedtime. 07/01/22   Corky Crafts, MD  rosuvastatin (CRESTOR) 40 MG tablet Take 1 tablet by mouth once daily 09/29/22   Corky Crafts, MD    Family History Family History  Problem Relation Age of Onset   Kidney disease Father    Stroke Father    Heart attack Father    Stroke Maternal Grandfather    Stroke Paternal Grandfather    Asthma Other    Hypertension Other    Diabetes Other     Social History Social History   Tobacco Use   Smoking status: Former    Current packs/day: 0.00    Types: Cigarettes    Quit date: 08/07/1982    Years since quitting: 40.7   Smokeless tobacco: Never  Vaping Use   Vaping status: Never Used  Substance Use Topics   Alcohol use: No   Drug use: No     Allergies   Bystolic [nebivolol hcl], Crestor [rosuvastatin], Sulfa antibiotics, Canagliflozin, Cephalexin, Empagliflozin, Meloxicam, Metformin and related,  Metformin hcl, Nebivolol hcl, Other, and Sitagliptin   Review of Systems Review of Systems Per HPI  Physical Exam Triage Vital Signs ED Triage Vitals  Encounter Vitals Group     BP 04/13/23 1327 (!) 159/81     Systolic BP Percentile --      Diastolic BP Percentile --      Pulse Rate 04/13/23 1327 74     Resp 04/13/23 1327 20     Temp 04/13/23 1327 98.3 F (36.8 C)     Temp Source 04/13/23 1327 Oral     SpO2 04/13/23 1327 92 %     Weight --      Height --      Head Circumference --      Peak Flow --      Pain Score 04/13/23 1331 5     Pain Loc --      Pain Education --      Exclude from Growth Chart --    No data found.  Updated Vital Signs BP (!) 159/81 (BP Location: Right Arm)   Pulse 74   Temp 98.3 F (36.8 C) (Oral)   Resp 20   LMP  (LMP Unknown)   SpO2 92%   Visual Acuity Right Eye Distance:   Left Eye Distance:   Bilateral Distance:    Right Eye Near:   Left Eye Near:    Bilateral Near:     Physical Exam Vitals and nursing note reviewed.  Constitutional:      Appearance: Normal appearance. She is not ill-appearing.  HENT:     Head: Atraumatic.  Eyes:     Extraocular Movements: Extraocular movements intact.     Conjunctiva/sclera: Conjunctivae normal.  Cardiovascular:     Rate and Rhythm: Normal rate.  Pulmonary:     Effort: Pulmonary effort is normal.  Abdominal:     General: Bowel sounds are normal. There is no distension.     Palpations: Abdomen is soft.     Tenderness: There is no abdominal tenderness. There is no right CVA tenderness, left CVA tenderness or guarding.  Musculoskeletal:        General: Normal range of motion.  Cervical back: Normal range of motion and neck supple.  Skin:    General: Skin is warm and dry.  Neurological:     Mental Status: She is alert and oriented to person, place, and time.  Psychiatric:        Mood and Affect: Mood normal.        Thought Content: Thought content normal.        Judgment: Judgment  normal.      UC Treatments / Results  Labs (all labs ordered are listed, but only abnormal results are displayed) Labs Reviewed  POCT URINALYSIS DIP (MANUAL ENTRY) - Abnormal; Notable for the following components:      Result Value   Color, UA red (*)    Clarity, UA cloudy (*)    Bilirubin, UA small (*)    Spec Grav, UA >=1.030 (*)    Blood, UA large (*)    Protein Ur, POC =100 (*)    All other components within normal limits    EKG   Radiology No results found.  Procedures Procedures (including critical care time)  Medications Ordered in UC Medications - No data to display  Initial Impression / Assessment and Plan / UC Course  I have reviewed the triage vital signs and the nursing notes.  Pertinent labs & imaging results that were available during my care of the patient were reviewed by me and considered in my medical decision making (see chart for details).     Hypertensive in triage, otherwise vital signs reassuring.  She states she has not yet taken her medications for blood pressure and will take this as soon as she gets home.  Urinalysis today with large blood, protein but no leuks or nitrites.  Given her history of kidney stones, similar symptoms and consistent exam findings do suspect a kidney stone.  Discussed with patient that we are unable to order imaging to confirm in the setting as we do not have CT or ultrasound access but to follow-up with her primary care provider soon as possible if not fully resolving for consideration for imaging.  Will treat in the meantime with Flomax, small supply of tramadol for as needed pain and lots of fluids.  Return for worsening symptoms.  Final Clinical Impressions(s) / UC Diagnoses   Final diagnoses:  Right flank pain  Gross hematuria  History of kidney stones  Elevated blood pressure reading   Discharge Instructions   None    ED Prescriptions     Medication Sig Dispense Auth. Provider   tamsulosin (FLOMAX) 0.4  MG CAPS capsule Take 1 capsule (0.4 mg total) by mouth daily. 14 capsule Particia Nearing, [place]   traMADol (ULTRAM) 50 MG tablet Take 1 tablet (50 mg total) by mouth every 12 (twelve) hours as needed. 10 tablet Particia Nearing, [place]      I have reviewed the PDMP during this encounter.   Particia Nearing, [place] 04/13/23 [phone]

## 2023-04-13 NOTE — ED Triage Notes (Signed)
 Pt reports she has right side low abdominal pain, poss blood in urine/ dark urine, and frequent urination x 2 weeks. Pain worsened x 2 days

## 2023-05-05 ENCOUNTER — Encounter (HOSPITAL_BASED_OUTPATIENT_CLINIC_OR_DEPARTMENT_OTHER): Payer: Self-pay | Admitting: Cardiology

## 2023-05-05 ENCOUNTER — Ambulatory Visit (HOSPITAL_BASED_OUTPATIENT_CLINIC_OR_DEPARTMENT_OTHER): Payer: Medicare Other | Admitting: Cardiology

## 2023-05-05 VITALS — BP 134/80 | HR 71 | Ht 62.0 in | Wt 270.4 lb

## 2023-05-05 DIAGNOSIS — E1169 Type 2 diabetes mellitus with other specified complication: Secondary | ICD-10-CM | POA: Diagnosis not present

## 2023-05-05 DIAGNOSIS — Z7901 Long term (current) use of anticoagulants: Secondary | ICD-10-CM

## 2023-05-05 DIAGNOSIS — I2510 Atherosclerotic heart disease of native coronary artery without angina pectoris: Secondary | ICD-10-CM

## 2023-05-05 DIAGNOSIS — I10 Essential (primary) hypertension: Secondary | ICD-10-CM | POA: Diagnosis not present

## 2023-05-05 DIAGNOSIS — I480 Paroxysmal atrial fibrillation: Secondary | ICD-10-CM

## 2023-05-05 DIAGNOSIS — D6869 Other thrombophilia: Secondary | ICD-10-CM

## 2023-05-05 DIAGNOSIS — Z8673 Personal history of transient ischemic attack (TIA), and cerebral infarction without residual deficits: Secondary | ICD-10-CM

## 2023-05-05 DIAGNOSIS — E669 Obesity, unspecified: Secondary | ICD-10-CM

## 2023-05-05 NOTE — Progress Notes (Signed)
 Cardiology Office Note:  .   Date:  05/05/2023  ID:  [name], DOB 03/09/1955, MRN [id number] PCP: Merri Brunette, MD  [facility] Providers Cardiologist:  Jodelle Red, MD {  History of Present Illness: .   [name] is a 68 y.o. female with PMH CAD, obesity, OSA, paroxysmal atrial fibrillation, diabetes type II. She established care with me on 11/03/22, previously followed by Dr. Eldridge Dace.  Pertinent CV history: Most recent cath 2012 with patent LAD stent, placed 06/2010. CVA in 11/2020.  Today: Has been dealing with kidney stones, had ER visit for this earlier this month. Her symptoms are slowly getting better. She was started on tamsulosin for this.   Her endocrinologist wants her to hold her ezetimibe because of diarrhea. Her diarrhea has improved somewhat holding this.   Just picked up her BP cuff today to start home monitoring. Reviewed goals, how to check home BP, reviewed medication list.   Planning to get back into gradual activity, [facility]  ROS: Denies chest pain, shortness of breath at rest or with normal exertion. No PND, orthopnea, LE edema or unexpected weight gain. No syncope or palpitations. ROS otherwise negative except as noted.   Studies Reviewed: Marland Kitchen    EKG:  EKG Interpretation Date/Time:  Wednesday May 05 2023 10:22:12 EDT Ventricular Rate:  76 PR Interval:  160 QRS Duration:  78 QT Interval:  388 QTC Calculation: 436 R Axis:   29  Text Interpretation: Normal sinus rhythm Low voltage QRS Confirmed by Jodelle Red [phone]) on 05/05/2023 10:31:10 AM    Physical Exam:   VS:  BP (!) 142/86   Pulse 71   Ht 5\' 2"  (1.575 m)   Wt 270 lb 6.4 oz (122.7 kg)   LMP  (LMP Unknown)   SpO2 99%   BMI 49.46 kg/m    Wt Readings from Last 3 Encounters:  05/05/23 270 lb 6.4 oz (122.7 kg)  01/14/23 282 lb (127.9 kg)  11/03/22 270 lb 6.4 oz (122.7 kg)    GEN: Well nourished, well developed in no acute  distress HEENT: Normal, moist mucous membranes NECK: No JVD CARDIAC: regular rhythm, normal S1 and S2, no rubs or gallops. 1/6 systolic murmur. VASCULAR: Radial and DP pulses 2+ bilaterally. No carotid bruits RESPIRATORY:  Clear to auscultation without rales, wheezing or rhonchi  ABDOMEN: Soft, non-tender, non-distended MUSCULOSKELETAL:  Ambulates independently SKIN: Warm and dry, no edema NEUROLOGIC:  Alert and oriented x 3. No focal neuro deficits noted. PSYCHIATRIC:  Normal affect    ASSESSMENT AND PLAN: .    CAD with stent 2012 Hypercholesterolemia -last LDL 53 per [facility] 04/2023 -on aspirin 81 mg daily, rosuvastatin 40 mg daily -if she has any persistent bleeding issues, would stop aspirin as she is on apixaban -ezetimibe on hold due to diarrhea. Recent lipids are great. She has upcoming GI visit. If no other etiology for her diarrhea found, we can stop ezetimibe long term and reassess. If other etiology found, would restart ezetimibe when able given excellent control on this -on carvedilol as anti anginal -reviewed red flag warning signs that need immediate medical attention  Hypertension -just picked up home cuff to start monitoring BP at home. -BP recheck improved  -reviewed meds extensively today. -continue amlodipine 10 mg daily, carvedilol 6.25 mg BID, furosemide 80 mg daily, lisinopril 40 mg daily -if BP elevated consistently >130/80 on home readings, would change lisinopril to ARB (valsartan, olmesartan, irbesartan preferred)  CVA  2022 Paroxysmal atrial fibrillation -CHA2DS2/VAS Stroke Risk Points=7  -on apixaban  Morbid obesity, BMI 49 Type II diabetes -on GLP1RA, on 2 mg dose weekly -did not tolerate SGLT2i due to UTI -on insulin long term  CV risk counseling and prevention -recommend heart healthy/Mediterranean diet, with whole grains, fruits, vegetable, fish, lean meats, nuts, and olive oil. Limit salt. -recommend moderate walking, 3-5 times/week for 30-50  minutes each session. Aim for at least 150 minutes.week. Goal should be pace of 3 miles/hours, or walking 1.5 miles in 30 minutes -recommend avoidance of tobacco products. Avoid excess alcohol.  Dispo: 6 mos  Signed, Jodelle Red, MD   Jodelle Red, MD, PhD, [facility]  [facility]  [place]  [facility] at [facility] at [facility] [address], [address] [place], [place] [address] [phone]

## 2023-05-05 NOTE — Patient Instructions (Addendum)
 Medication Instructions:  Your physician recommends that you continue on your current medications as directed. Please refer to the Current Medication list given to you today.  *If you need a refill on your cardiac medications before your next appointment, please call your pharmacy*  Lab Work: NONE  Testing/Procedures: NONE  Follow-Up: At [facility], you and your health needs are our priority.  As part of our continuing mission to provide you with exceptional heart care, we have created designated Provider Care Teams.  These Care Teams include your primary Cardiologist (physician) and Advanced Practice Providers (APPs -  Physician Assistants and Nurse Practitioners) who all work together to provide you with the care you need, when you need it.  We recommend signing up for the patient portal called "MyChart".  Sign up information is provided on this After Visit Summary.  MyChart is used to connect with patients for Virtual Visits (Telemedicine).  Patients are able to view lab/test results, encounter notes, upcoming appointments, etc.  Non-urgent messages can be sent to your provider as well.   To learn more about what you can do with MyChart, go to [email or web address].    Your next appointment:   6 month(s)  The format for your next appointment:   In Person  Provider:   Jodelle Red, MD, Ronn Melena NP, OR MICHELLE S NP     MONITOR BLOOD PRESSURE AT HOME  how to check blood pressure:  -sit comfortably in a chair, feet uncrossed and flat on floor, for 5-10 minutes  -arm ideally should rest at the level of the heart. However, arm should be relaxed and not tense (for example, do not hold the arm up unsupported)  -avoid exercise, caffeine, and tobacco for at least 30 minutes prior to BP reading  -don't take BP cuff reading over clothes (always place on skin directly)  -I prefer to know how well the medication is working, so I would like you to take your readings 1-2  hours after taking your blood pressure medication if possible   If your blood pressure is consistently more than 130 on the top or 80 on the bottom, let me or your primary care know and we will make adjustments to your medication.

## 2023-05-25 ENCOUNTER — Other Ambulatory Visit: Payer: Self-pay | Admitting: Family Medicine

## 2023-05-25 ENCOUNTER — Ambulatory Visit
Admission: RE | Admit: 2023-05-25 | Discharge: 2023-05-25 | Disposition: A | Discharge status: Home / Self Care | Source: Ambulatory Visit | Attending: Family Medicine | Admitting: Family Medicine

## 2023-05-25 DIAGNOSIS — R109 Unspecified abdominal pain: Secondary | ICD-10-CM

## 2023-06-03 ENCOUNTER — Telehealth: Payer: Self-pay | Admitting: Cardiology

## 2023-06-03 ENCOUNTER — Other Ambulatory Visit: Payer: Self-pay | Admitting: Urology

## 2023-06-03 NOTE — Telephone Encounter (Signed)
   Pre-operative Risk Assessment    Patient Name: [name]  DOB: Jan 19, 1956 MRN: [id number]   Date of last office visit: 05/05/23  Date of next office visit: 11/04/23   Request for Surgical Clearance    Procedure:   Right ureteroscopy, stone removal    Date of Surgery:  Clearance 06/15/23                                Surgeon:  Dr. Marshell Skelton Group or Practice Name:  [facility]   Phone number:  [phone]  Fax number:  [phone]    Type of Clearance Requested:   - Medical  - Pharmacy:  Hold Apixaban  (Eliquis ) two days prior/ okay to stay on Aspirin      Type of Anesthesia:  General    Additional requests/questions:    Virgie Griffith   06/03/2023, 10:36 AM

## 2023-06-04 NOTE — Telephone Encounter (Signed)
 Patient with diagnosis of afib on Eliquis  for anticoagulation.    Procedure: Right ureteroscopy, stone removal   Date of procedure: 06/15/23   CHA2DS2-VASc Score = 7   This indicates a 11.2% annual risk of stroke. The patient's score is based upon: CHF History: 0 HTN History: 1 Diabetes History: 1 Stroke History: 2 Vascular Disease History: 1 Age Score: 1 Gender Score: 1      CrCl 82 ml/min Platelet count 250  Patient has not had an Afib/aflutter ablation within the last 3 months or DCCV within the last 30 days  Per office protocol, patient can hold Eliquis  for 2 days prior to procedure.    **This guidance is not considered finalized until pre-operative APP has relayed final recommendations.**

## 2023-06-04 NOTE — Telephone Encounter (Signed)
 Dr. Veryl Gottron please review, patient was seen a month ago for follow-up, she was stable from the cardiac perspective at that was dealing with kidney stone issue at the time.  She required right ureteroscopy and stone removal on 06/15/2023.  Would you be okay with her proceeding with upcoming urology procedure from the cardiac perspective?

## 2023-06-07 NOTE — Patient Instructions (Signed)
 SURGICAL WAITING ROOM VISITATION Patients having surgery or a procedure may have no more than 2 support people in the waiting area - these visitors may rotate in the visitor waiting room.   If the patient needs to stay at the hospital during part of their recovery, the visitor guidelines for inpatient rooms apply.  PRE-OP VISITATION  Pre-op nurse will coordinate an appropriate time for 1 support person to accompany the patient in pre-op.  This support person may not rotate.  This visitor will be contacted when the time is appropriate for the visitor to come back in the pre-op area.  Please refer to the [facility] website for the visitor guidelines for Inpatients (after your surgery is over and you are in a regular room).  You are not required to quarantine at this time prior to your surgery. However, you must do this: Hand Hygiene often Do NOT share personal items Notify your provider if you are in close contact with someone who has COVID or you develop fever 100.4 or greater, new onset of sneezing, cough, sore throat, shortness of breath or body aches.  If you test positive for Covid or have been in contact with anyone that has tested positive in the last 10 days please notify you surgeon.    Your procedure is scheduled on:    TUESDAY Jun 15, 2023  Report to [facility] Main Entrance: [address] entrance where the [employer] is available.   Report to admitting at: 06:15    AM  Call this number if you have any questions or problems the morning of surgery [phone]  DO NOT EAT OR DRINK ANYTHING AFTER MIDNIGHT THE NIGHT PRIOR TO YOUR SURGERY / PROCEDURE.   FOLLOW  ANY ADDITIONAL PRE OP INSTRUCTIONS YOU RECEIVED FROM YOUR SURGEON'S OFFICE!!!   Oral Hygiene is also important to reduce your risk of infection.        Remember - BRUSH YOUR TEETH THE MORNING OF SURGERY WITH YOUR REGULAR TOOTHPASTE  Do NOT smoke after Midnight the night before surgery.  STOP TAKING all Vitamins,  Herbs and supplements 1 week before your surgery.   ELIQUIS - Stop taking 48 hours before surgery. Last dose will be taken on SATURDAY  06-12-23  OZEMPIC-  last injection was 05-30-23  HUMULIN R  -  Day before surgery: take as usual.  DAY OF SURGERY: If CBG is > 220 mg/dL you may take 1/2 of your correction dose of insulin .    DO NOT TAKE FUROSEMIDE  (LASIX ) OR LISINOPRIL  THE MORNING OF YOUR SURGERY.   Take ONLY these medicines the morning of surgery with A SIP OF WATER : Tamsulosin , amlodipine , carvedilol , and Tylenol  if needed for pain. You may use your Eye drops and your Breo Ellipta  / Albuterol  inhalers. Please bring your Albuterol  inhaler with you on the day of surgery.                     You may not have any metal on your body including hair pins, jewelry, and body piercing  Do not wear make-up, lotions, powders, perfumes or deodorant  Do not wear nail polish including gel and S&S, artificial / acrylic nails, or any other type of covering on natural nails including finger and toenails. If you have artificial nails, gel coating, etc., that needs to be removed by a nail salon, Please have this removed prior to surgery. Not doing so may mean that your surgery could be cancelled or delayed if the Surgeon or anesthesia  staff feels like they are unable to monitor you safely.   Do not shave 48 hours prior to surgery to avoid nicks in your skin which may contribute to postoperative infections.   Contacts, Hearing Aids, dentures or bridgework may not be worn into surgery. DENTURES WILL BE REMOVED PRIOR TO SURGERY PLEASE DO NOT APPLY "Poly grip" OR ADHESIVES!!!  Patients discharged on the day of surgery will not be allowed to drive home.  Someone NEEDS to stay with you for the first 24 hours after anesthesia.  Do not bring your home medications to the hospital. The Pharmacy will dispense medications listed on your medication list to you during your admission in the Hospital.  Please read over the  following fact sheets you were given: IF YOU HAVE QUESTIONS ABOUT YOUR PRE-OP INSTRUCTIONS, PLEASE CALL [phone].   [place] - Preparing for Surgery Before surgery, you can play an important role.  Because skin is not sterile, your skin needs to be as free of germs as possible.  You can reduce the number of germs on your skin by washing with CHG (chlorahexidine gluconate) soap before surgery.  CHG is an antiseptic cleaner which kills germs and bonds with the skin to continue killing germs even after washing. Please DO NOT use if you have an allergy to CHG or antibacterial soaps.  If your skin becomes reddened/irritated stop using the CHG and inform your nurse when you arrive at Short Stay. Do not shave (including legs and underarms) for at least 48 hours prior to the first CHG shower.  You may shave your face/neck.  Please follow these instructions carefully:  1.  Shower with CHG Soap the night before surgery and the  morning of surgery.  2.  If you choose to wash your hair, wash your hair first as usual with your normal  shampoo.  3.  After you shampoo, rinse your hair and body thoroughly to remove the shampoo.                             4.  Use CHG as you would any other liquid soap.  You can apply chg directly to the skin and wash.  Gently with a scrungie or clean washcloth.  5.  Apply the CHG Soap to your body ONLY FROM THE NECK DOWN.   Do not use on face/ open                           Wound or open sores. Avoid contact with eyes, ears mouth and genitals (private parts).                       Wash face,  Genitals (private parts) with your normal soap.             6.  Wash thoroughly, paying special attention to the area where your  surgery  will be performed.  7.  Thoroughly rinse your body with warm water  from the neck down.  8.  DO NOT shower/wash with your normal soap after using and rinsing off the CHG Soap.            9.  Pat yourself dry with a clean towel.            10.  Wear  clean pajamas.            11.  Place clean  sheets on your bed the night of your first shower and do not  sleep with pets.  ON THE DAY OF SURGERY : Do not apply any lotions/deodorants the morning of surgery.  Please wear clean clothes to the hospital/surgery center.    FAILURE TO FOLLOW THESE INSTRUCTIONS MAY RESULT IN THE CANCELLATION OF YOUR SURGERY  PATIENT SIGNATURE_________________________________  NURSE SIGNATURE__________________________________  ________________________________________________________________________

## 2023-06-07 NOTE — Progress Notes (Signed)
 COVID Vaccine received:  []  No [x]  Yes Date of any COVID positive Test in last 90 days:  None  PCP - Faustina Hood, MD  Cardiologist - Sheryle Donning, MD   Chest x-ray - 12-28-2020  2v  Epic EKG -  05-05-2023  Epic Stress Test - 11-13-2013   Epic ECHO -11-29-2018  Epic  Cardiac Cath - 2012  LHC x 1 stent  Bowel Prep - [x]  No  []   Yes ______  Pacemaker / ICD device [x]  No []  Yes   Spinal Cord Stimulator:[x]  No []  Yes       History of Sleep Apnea? []  No [x]  Yes   CPAP used?- [x]  No []  Yes    Does the patient monitor blood sugar?   []  N/A   []  No [x]  Yes  Patient has: []  NO Hx DM   []  Pre-DM   []  DM1  [x]   DM2 Last A1c was:  9.0 on  11-28-2020     Does patient have a [employer] or Dexcom? [x]  No []  Yes    HUMULIN R - 30-80 units   80 in am and 30 in pm  Ozempic- on Sundays, last injection: 06-06-2023  Blood Thinner / Instructions:  Eliquis   hold x 48 hours Aspirin  Instructions:  ASA 81 mg,  stopped already  ERAS Protocol Ordered: [x]  No  []  Yes Patient is to be NPO after: midnight prior  Dental hx: []  Dentures:  [x]  N/A      []  Bridge or Partial:                   []  Loose or Damaged teeth:   Activity level: Patient is able to climb a flight of stairs without difficulty; [x]  No CP   but would have SOB_   Patient can  perform ADLs without assistance.   Anesthesia review: CHF, DM2, OSA- No CPAP, HTN, A.fib, Hx CVA 11-2020, GERD, asthma  Patient denies shortness of breath, fever, cough and chest pain at PAT appointment.  Patient verbalized understanding and agreement to the Pre-Surgical Instructions that were given to them at this PAT appointment. Patient was also educated of the need to review these PAT instructions again prior to her surgery.I reviewed the appropriate phone numbers to call if they have any and questions or concerns.

## 2023-06-08 ENCOUNTER — Encounter (HOSPITAL_COMMUNITY)
Admission: RE | Admit: 2023-06-08 | Discharge: 2023-06-08 | Disposition: A | Discharge status: Home / Self Care | Source: Ambulatory Visit | Attending: Urology | Admitting: Urology

## 2023-06-08 ENCOUNTER — Other Ambulatory Visit: Payer: Self-pay

## 2023-06-08 ENCOUNTER — Encounter (HOSPITAL_COMMUNITY): Payer: Self-pay

## 2023-06-08 VITALS — BP 126/54 | HR 70 | Temp 98.3°F | Resp 20 | Ht 62.0 in | Wt 272.0 lb

## 2023-06-08 DIAGNOSIS — I2510 Atherosclerotic heart disease of native coronary artery without angina pectoris: Secondary | ICD-10-CM | POA: Diagnosis not present

## 2023-06-08 DIAGNOSIS — I480 Paroxysmal atrial fibrillation: Secondary | ICD-10-CM | POA: Insufficient documentation

## 2023-06-08 DIAGNOSIS — I110 Hypertensive heart disease with heart failure: Secondary | ICD-10-CM | POA: Insufficient documentation

## 2023-06-08 DIAGNOSIS — I509 Heart failure, unspecified: Secondary | ICD-10-CM | POA: Diagnosis not present

## 2023-06-08 DIAGNOSIS — G4730 Sleep apnea, unspecified: Secondary | ICD-10-CM | POA: Diagnosis not present

## 2023-06-08 DIAGNOSIS — E1140 Type 2 diabetes mellitus with diabetic neuropathy, unspecified: Secondary | ICD-10-CM | POA: Insufficient documentation

## 2023-06-08 DIAGNOSIS — N201 Calculus of ureter: Secondary | ICD-10-CM | POA: Insufficient documentation

## 2023-06-08 DIAGNOSIS — Z794 Long term (current) use of insulin: Secondary | ICD-10-CM | POA: Diagnosis not present

## 2023-06-08 DIAGNOSIS — Z01818 Encounter for other preprocedural examination: Secondary | ICD-10-CM

## 2023-06-08 DIAGNOSIS — Z01812 Encounter for preprocedural laboratory examination: Secondary | ICD-10-CM | POA: Insufficient documentation

## 2023-06-08 DIAGNOSIS — Z87891 Personal history of nicotine dependence: Secondary | ICD-10-CM | POA: Diagnosis not present

## 2023-06-08 DIAGNOSIS — I10 Essential (primary) hypertension: Secondary | ICD-10-CM

## 2023-06-08 DIAGNOSIS — E119 Type 2 diabetes mellitus without complications: Secondary | ICD-10-CM

## 2023-06-08 HISTORY — DX: Personal history of urinary calculi: Z87.442

## 2023-06-08 HISTORY — DX: Cardiac arrhythmia, unspecified: I49.9

## 2023-06-08 LAB — CBC
HCT: 35.1 % — ABNORMAL LOW (ref 36.0–46.0)
Hemoglobin: 11.5 g/dL — ABNORMAL LOW (ref 12.0–15.0)
MCH: 24.7 pg — ABNORMAL LOW (ref 26.0–34.0)
MCHC: 32.8 g/dL (ref 30.0–36.0)
MCV: 75.3 fL — ABNORMAL LOW (ref 80.0–100.0)
Platelets: 289 10*3/uL (ref 150–400)
RBC: 4.66 MIL/uL (ref 3.87–5.11)
RDW: 16.7 % — ABNORMAL HIGH (ref 11.5–15.5)
WBC: 6.8 10*3/uL (ref 4.0–10.5)
nRBC: 0 % (ref 0.0–0.2)

## 2023-06-08 LAB — BASIC METABOLIC PANEL WITH GFR
Anion gap: 7 (ref 5–15)
BUN: 16 mg/dL (ref 8–23)
CO2: 28 mmol/L (ref 22–32)
Calcium: 9.1 mg/dL (ref 8.9–10.3)
Chloride: 101 mmol/L (ref 98–111)
Creatinine, Ser: 1.08 mg/dL — ABNORMAL HIGH (ref 0.44–1.00)
GFR, Estimated: 56 mL/min — ABNORMAL LOW (ref >60)
Glucose, Bld: 196 mg/dL — ABNORMAL HIGH (ref 70–99)
Potassium: 3.7 mmol/L (ref 3.5–5.1)
Sodium: 136 mmol/L (ref 135–145)

## 2023-06-08 LAB — HEMOGLOBIN A1C
Hgb A1c MFr Bld: 8.4 % — ABNORMAL HIGH (ref 4.8–5.6)
Mean Plasma Glucose: 194.38 mg/dL

## 2023-06-08 LAB — GLUCOSE, CAPILLARY: Glucose-Capillary: 172 mg/dL — ABNORMAL HIGH (ref 70–99)

## 2023-06-09 ENCOUNTER — Other Ambulatory Visit: Payer: Self-pay | Admitting: Interventional Cardiology

## 2023-06-09 NOTE — Telephone Encounter (Signed)
   Patient Name: [name]  DOB: 13-Mar-1955 MRN: [id number]  Primary Cardiologist: Sheryle Donning, MD  Chart reviewed as part of pre-operative protocol coverage.  I reached out to Dr. Veryl Gottron who confirms patient may proceed with requested procedure without further cardiovascular testing.   Per office protocol, patient can hold Eliquis  for 2 days prior to procedure.    Will route this bundled recommendation to requesting provider via Epic fax function. Please call with questions.   Yuridia Couts N Jamarkus Lisbon, PA-C 06/09/2023, 2:08 PM

## 2023-06-10 NOTE — Progress Notes (Signed)
 Anesthesia Chart Review   Case: [id number] Date/Time: 06/15/23 0815   Procedures:      CYSTOSCOPY/URETEROSCOPY/HOLMIUM LASER/STENT PLACEMENT (Right)     CYSTOSCOPY, WITH RETROGRADE PYELOGRAM (Right)   Anesthesia type: General   Diagnosis: Calculus of ureter [N20.1]   Pre-op diagnosis: RIGHT URETERAL STONE   Location[facility] ROOM 01 / [facility]   Surgeons: Thelbert Finner, MD       DISCUSSION:68 y.o. former smoker with h/o HTN, sleep apnea, atrial fibrillation, CAD (cath 2012 with patent LAD stent, placed 06/2010), CHF, DM II, right ureteral stone scheduled for above procedure 06/15/23 with Dr. Aimee Alf.   Per cardiology preoperative evaluation 06/09/2023, "I reached out to Dr. Veryl Gottron who confirms patient may proceed with requested procedure without further cardiovascular testing.   Per office protocol, patient can hold Eliquis  for 2 days prior to procedure."  VS: BP (!) 126/54 Comment: right arm sitting  Pulse 70   Temp 36.8 C (Oral)   Resp 20   Ht 5\' 2"  (1.575 m)   Wt 123.4 kg   LMP  (LMP Unknown)   SpO2 98%   BMI 49.75 kg/m   PROVIDERS: Faustina Hood, MD is PCP   Primary Cardiologist: Sheryle Donning, MD  LABS: Labs reviewed: Acceptable for surgery. (all labs ordered are listed, but only abnormal results are displayed)  Labs Reviewed  HEMOGLOBIN A1C - Abnormal; Notable for the following components:      Result Value   Hgb A1c MFr Bld 8.4 (*)    All other components within normal limits  BASIC METABOLIC PANEL WITH GFR - Abnormal; Notable for the following components:   Glucose, Bld 196 (*)    Creatinine, Ser 1.08 (*)    GFR, Estimated 56 (*)    All other components within normal limits  CBC - Abnormal; Notable for the following components:   Hemoglobin 11.5 (*)    HCT 35.1 (*)    MCV 75.3 (*)    MCH 24.7 (*)    RDW 16.7 (*)    All other components within normal limits  GLUCOSE, CAPILLARY - Abnormal; Notable for the following components:    Glucose-Capillary 172 (*)    All other components within normal limits     IMAGES:   EKG:   CV: Echo 11/28/2020  1. Left ventricular ejection fraction, by estimation, is 60 to 65%. The  left ventricle has normal function. The left ventricle has no regional  wall motion abnormalities. There is moderate concentric left ventricular  hypertrophy. Left ventricular  diastolic parameters are consistent with Grade I diastolic dysfunction  (impaired relaxation). Elevated left ventricular end-diastolic pressure.   2. RV-RA gradient normal at 27 mmHg suggesting normal RVSP. Right  ventricular systolic function is normal. The right ventricular size is  normal.   3. The mitral valve is grossly normal. Trivial mitral valve  regurgitation.   4. The aortic valve is tricuspid. Aortic valve regurgitation is not  visualized. Aortic valve mean gradient measures 5.0 mmHg.   5. Unable to estimate CVP.   Past Medical History:  Diagnosis Date   Arthritis    Asthma    CHF (congestive heart failure) (HCC)    Coronary artery disease    Diabetes mellitus    Diabetic neuropathy (HCC)    Dysrhythmia    A.fib    takes Eliquis    GERD (gastroesophageal reflux disease)    History of hiatal hernia    History of kidney stones    Hx of cardiovascular stress test  Lexiscan  Myoview (10/15):  Medium area of ischemia in AL and IL distribution, cannot completely exclude shifting breast attenuation, EF 64%; Abnormal study   Hyperlipidemia    Hypertension    Mild carotid artery disease (HCC)    Morbid obesity (HCC)    PAF (paroxysmal atrial fibrillation) (HCC)    Sleep apnea    does not wear CPAP   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   Syncope     Past Surgical History:  Procedure Laterality Date   ACHILLES TENDON REPAIR  1995   right   BIOPSY  07/22/2021   Procedure: BIOPSY;  Surgeon: Genell Ken, MD;  Location: [facility];  Service: Gastroenterology;;   CATARACT EXTRACTION Bilateral     COLONOSCOPY     COLONOSCOPY WITH PROPOFOL  N/A 07/22/2021   Procedure: COLONOSCOPY WITH PROPOFOL ;  Surgeon: Genell Ken, MD;  [facility];  Service: Gastroenterology;  Laterality: N/A;   DILATION AND CURETTAGE OF UTERUS     ESOPHAGOGASTRODUODENOSCOPY (EGD) WITH PROPOFOL  N/A 07/22/2021   Procedure: ESOPHAGOGASTRODUODENOSCOPY (EGD) WITH PROPOFOL ;  Surgeon: Genell Ken, MD;  [facility]: [facility];  Service: Gastroenterology;  Laterality: N/A;   EYE SURGERY Left    detached retina repair   HAND SURGERY Right    right  pinky finger   ORIF   heart stent  2007   PARS PLANA VITRECTOMY Left 03/24/2017   Procedure: PARS PLANA VITRECTOMY WITH 25 GAUGE;  Surgeon: Shon Downing, MD;  Location: [facility];  Service: Ophthalmology;  Laterality: Left;   POLYPECTOMY  07/22/2021   Procedure: POLYPECTOMY;  Surgeon: Genell Ken, MD;  [facility];  Service: Gastroenterology;;   UTERINE FIBROID SURGERY  2008    MEDICATIONS:  acetaminophen  (TYLENOL ) 500 MG tablet   albuterol  (VENTOLIN  HFA) 108 (90 Base) MCG/ACT inhaler   amLODipine  (NORVASC ) 10 MG tablet   apixaban  (ELIQUIS ) 5 MG TABS tablet   aspirin  EC 81 MG tablet   BREO ELLIPTA  100-25 MCG/INH AEPB   carvedilol  (COREG ) 6.25 MG tablet   colestipol (COLESTID) 1 g tablet   escitalopram (LEXAPRO) 5 MG tablet   ezetimibe  (ZETIA ) 10 MG tablet   FREESTYLE LITE test strip   furosemide  (LASIX ) 40 MG tablet   HUMULIN R  U-500 KWIKPEN 500 UNIT/ML KwikPen   lisinopril  (ZESTRIL ) 40 MG tablet   methocarbamol (ROBAXIN) 750 MG tablet   nitroGLYCERIN  (NITROSTAT ) 0.4 MG SL tablet   OZEMPIC, 2 MG/DOSE, 8 MG/3ML SOPN   potassium chloride  (KLOR-CON  10) 10 MEQ tablet   Povidone, PF, (IVIZIA DRY EYES) 0.5 % SOLN   rosuvastatin  (CRESTOR ) 40 MG tablet   tamsulosin  (FLOMAX ) 0.4 MG CAPS capsule   No current facility-administered medications for this encounter.    Chick Cotton Ward, PA-C [facility]-Surgical [facility] [phone]

## 2023-06-12 NOTE — H&P (Signed)
 68 year old female referred for kidney stone. Patient has a history of kidney stones in the past she presented to her PCP on 05/25/2023. She been on Flomax  and tramadol .   PMH: On Eliquis , diabetes, hypertension, obesity, OSA, peripheral neuropathy, A1c 9.3. Pt had CVA. (Pt sees [facility]. Janeece Mechanic at [facility] Dr. Jeananne Mighty.  PSH: Fibroid removal, D&C, cardiac stent,   Right kidney stone:  05/29/23: CT shows right proximal ureteral stone mild hydronephrosis large right renal cyst with calcifications that has been stable since 2022. 6-7 mm stone. Currently having pain. Patient is eating. No nausea or vomiting. Had GH 1 month ago. No Fevers or chills. Attempted ESWL. On tramadol .     ALLERGIES: Cephalexin  Invokana Januvia Jardiance Meloxicam Metformin [id number] Sulfamethoxazole    MEDICATIONS: Crestor  40 MG Tablet  Lisinopril  40 MG Tablet  amLODIPine  Besylate 10 MG Tablet  Aspirin  81  Carvedilol  6.25 MG Tablet  Colestipol HCl 1 GM Tablet  Eliquis  5 MG Tablet  Escitalopram Oxalate 5 MG Tablet  Furosemide  40 MG Tablet  Nitroglycerin   Ozempic (2 MG/DOSE)  Rosuvastatin  Calcium  40 MG Tablet     GU PSH: No GU PSH     NON-GU PSH: No Non-GU PSH          GU PMH: No GU PMH     NON-GU PMH: No Non-GU PMH     FAMILY HISTORY: No Family History     SOCIAL HISTORY: No Social History     REVIEW OF SYSTEMS:     GU Review Female:  Patient denies frequent urination, hard to postpone urination, burning /pain with urination, get up at night to urinate, leakage of urine, stream starts and stops, trouble starting your stream, have to strain to urinate, and being pregnant.    Gastrointestinal (Upper):  Patient denies indigestion/ heartburn, vomiting, and nausea.    Gastrointestinal (Lower):  Patient denies diarrhea and constipation.    Constitutional:  Patient denies fever, night sweats, weight loss, and fatigue.    Skin:  Patient denies skin rash/ lesion and itching.    Eyes:  Patient  denies blurred vision and double vision.    Ears/ Nose/ Throat:  Patient denies sore throat and sinus problems.    Hematologic/Lymphatic:  Patient denies swollen glands and easy bruising.    Cardiovascular:  Patient denies leg swelling and chest pains.    Respiratory:  Patient denies cough and shortness of breath.    Endocrine:  Patient denies excessive thirst.    Musculoskeletal:  Patient denies back pain and joint pain.    Neurological:  Patient denies headaches and dizziness.    Psychologic:  Patient denies depression and anxiety.    VITAL SIGNS:       05/31/2023 08:33 AM     BP 125/74 mmHg     Pulse 73 /min     Temperature 97.7 F / 36.5 C     MULTI-SYSTEM PHYSICAL EXAMINATION:      Constitutional: Well-nourished. No physical deformities. Normally developed. Good grooming.     Respiratory: No labored breathing, no use of accessory muscles.      Cardiovascular: Normal temperature, normal extremity pulses, no swelling, no varicosities.     Skin: No paleness, no jaundice, no cyanosis. No lesion, no ulcer, no rash.     Musculoskeletal: Normal gait and station of head and neck.            Complexity of Data:   Source Of History:  Patient  Records Review:  Previous Patient Records  Urine  Test Review:  Urinalysis  X-Ray Review: C.T. Abdomen/Pelvis: Reviewed Films. Reviewed Report. Right ureteral stone 7 mm since been stable for many years.    PROCEDURES:    Visit Complexity - G2211      Urinalysis w/Scope  Dipstick Dipstick Cont'd Micro  Color: Yellow Bilirubin: Neg mg/dL WBC/hpf: 0 - 5/hpf  Appearance: Slightly Cloudy Ketones: Neg mg/dL RBC/hpf: 3 - 16/XWR  Specific Gravity: 1.020 Blood: 2+ ery/uL Bacteria: Mod (26-50/hpf)  pH: <=5.0 Protein: Trace mg/dL Cystals: Uric Acid  Glucose: Neg mg/dL Urobilinogen: 0.2 mg/dL Casts: NS (Not Seen)   Nitrites: Neg Trichomonas: Not Present   Leukocyte Esterase: Neg leu/uL Mucous: Not Present    Epithelial Cells: NS (Not Seen)    Yeast:  NS (Not Seen)    Sperm: Present    ASSESSMENT:     ICD-10 Details  1 GU:  Ureteral calculus - N20.1 Undiagnosed New Problem   PLAN:   Medications  New Meds: Methocarbamol 750 MG Tablet 1 tablet PO QID #90 0 Refill(s)     Pharmacy Name:  [id number]    Address:  [address]     [place], [place] [address]    Phone:  [phone]    Fax:  [phone]   Orders  Labs Urine Culture, CBC with Diff, BMP  Document  Letter(s):  Created for Chart   Created for Patient: Clinical Summary   Notes:  Ureteral stone: Will plan for right ureteroscopy with laser lithotripsy urine culture ordered today as well as labs. Will get clearance from cardiologist mentioned in the HPI.   We discussed the risk benefits and alternatives to ureteroscopy. This includes bleeding, infection, damage to surrounding structures including the urethra, bladder, ureter, and kidney. With these possible injuries resulting in need for intervention in the future. We discussed inability to remove all the stone and requiring follow-up ureteroscopy. We also discussed the possibility of not being able to gain access to the kidney and the need for nephrostomy tube. Possibility of long-term stent was also discussed. The patient voiced their understanding and would like to proceed.   Ucx negative

## 2023-06-14 MED ORDER — GENTAMICIN SULFATE 40 MG/ML IJ SOLN
5.0000 mg/kg | INTRAVENOUS | Status: AC
  Administered 2023-06-15: 400 mg via INTRAVENOUS
  Filled 2023-06-14 (×2): qty 10

## 2023-06-15 ENCOUNTER — Encounter (HOSPITAL_COMMUNITY)
Admission: RE | Disposition: A | Discharge status: Home / Self Care | Payer: Self-pay | Source: Home / Self Care | Attending: Urology

## 2023-06-15 ENCOUNTER — Ambulatory Visit (HOSPITAL_BASED_OUTPATIENT_CLINIC_OR_DEPARTMENT_OTHER): Admitting: Anesthesiology

## 2023-06-15 ENCOUNTER — Ambulatory Visit (HOSPITAL_COMMUNITY): Payer: Self-pay | Admitting: Physician Assistant

## 2023-06-15 ENCOUNTER — Ambulatory Visit (HOSPITAL_COMMUNITY)
Admission: RE | Admit: 2023-06-15 | Discharge: 2023-06-15 | Disposition: A | Discharge status: Home / Self Care | Attending: Urology | Admitting: Urology

## 2023-06-15 ENCOUNTER — Encounter (HOSPITAL_COMMUNITY): Payer: Self-pay | Admitting: Urology

## 2023-06-15 ENCOUNTER — Ambulatory Visit (HOSPITAL_COMMUNITY)

## 2023-06-15 ENCOUNTER — Other Ambulatory Visit: Payer: Self-pay

## 2023-06-15 DIAGNOSIS — G4730 Sleep apnea, unspecified: Secondary | ICD-10-CM | POA: Insufficient documentation

## 2023-06-15 DIAGNOSIS — I110 Hypertensive heart disease with heart failure: Secondary | ICD-10-CM | POA: Insufficient documentation

## 2023-06-15 DIAGNOSIS — I509 Heart failure, unspecified: Secondary | ICD-10-CM | POA: Diagnosis not present

## 2023-06-15 DIAGNOSIS — N201 Calculus of ureter: Secondary | ICD-10-CM

## 2023-06-15 DIAGNOSIS — I2510 Atherosclerotic heart disease of native coronary artery without angina pectoris: Secondary | ICD-10-CM | POA: Insufficient documentation

## 2023-06-15 DIAGNOSIS — E119 Type 2 diabetes mellitus without complications: Secondary | ICD-10-CM | POA: Diagnosis not present

## 2023-06-15 DIAGNOSIS — M1990 Unspecified osteoarthritis, unspecified site: Secondary | ICD-10-CM | POA: Diagnosis not present

## 2023-06-15 DIAGNOSIS — Z794 Long term (current) use of insulin: Secondary | ICD-10-CM

## 2023-06-15 DIAGNOSIS — I4891 Unspecified atrial fibrillation: Secondary | ICD-10-CM | POA: Insufficient documentation

## 2023-06-15 DIAGNOSIS — Z8673 Personal history of transient ischemic attack (TIA), and cerebral infarction without residual deficits: Secondary | ICD-10-CM | POA: Diagnosis not present

## 2023-06-15 DIAGNOSIS — J45909 Unspecified asthma, uncomplicated: Secondary | ICD-10-CM | POA: Diagnosis not present

## 2023-06-15 DIAGNOSIS — I10 Essential (primary) hypertension: Secondary | ICD-10-CM | POA: Insufficient documentation

## 2023-06-15 DIAGNOSIS — Z01818 Encounter for other preprocedural examination: Secondary | ICD-10-CM

## 2023-06-15 DIAGNOSIS — Z87891 Personal history of nicotine dependence: Secondary | ICD-10-CM | POA: Diagnosis not present

## 2023-06-15 HISTORY — PX: CYSTOSCOPY W/ RETROGRADES: SHX1426

## 2023-06-15 HISTORY — PX: CYSTOSCOPY/URETEROSCOPY/HOLMIUM LASER/STENT PLACEMENT: SHX6546

## 2023-06-15 LAB — GLUCOSE, CAPILLARY
Glucose-Capillary: 176 mg/dL — ABNORMAL HIGH (ref 70–99)
Glucose-Capillary: 172 mg/dL — ABNORMAL HIGH (ref 70–99)

## 2023-06-15 SURGERY — CYSTOSCOPY/URETEROSCOPY/HOLMIUM LASER/STENT PLACEMENT
Anesthesia: General | Laterality: Right

## 2023-06-15 MED ORDER — ONDANSETRON HCL 4 MG/2ML IJ SOLN
INTRAMUSCULAR | Status: DC | PRN
  Administered 2023-06-15: 4 mg via INTRAVENOUS

## 2023-06-15 MED ORDER — LIDOCAINE HCL (PF) 2 % IJ SOLN
INTRAMUSCULAR | Status: AC
  Filled 2023-06-15: qty 5

## 2023-06-15 MED ORDER — INSULIN ASPART 100 UNIT/ML IJ SOLN
0.0000 [IU] | INTRAMUSCULAR | Status: AC | PRN
  Administered 2023-06-15: 2 [IU] via SUBCUTANEOUS

## 2023-06-15 MED ORDER — PHENYLEPHRINE 80 MCG/ML (10ML) SYRINGE FOR IV PUSH (FOR BLOOD PRESSURE SUPPORT)
PREFILLED_SYRINGE | INTRAVENOUS | Status: DC | PRN
  Administered 2023-06-15 (×3): 160 ug via INTRAVENOUS

## 2023-06-15 MED ORDER — METHOCARBAMOL 750 MG PO TABS: 750.0000 mg | ORAL_TABLET | Freq: Four times a day (QID) | ORAL | 0 refills | Status: AC

## 2023-06-15 MED ORDER — SODIUM CHLORIDE 0.9 % IV SOLN
1.0000 g | INTRAVENOUS | Status: AC
  Administered 2023-06-15: 1 g via INTRAVENOUS
  Filled 2023-06-15: qty 1000

## 2023-06-15 MED ORDER — FENTANYL CITRATE (PF) 100 MCG/2ML IJ SOLN
INTRAMUSCULAR | Status: AC
  Filled 2023-06-15: qty 2

## 2023-06-15 MED ORDER — PROPOFOL 10 MG/ML IV BOLUS
INTRAVENOUS | Status: DC | PRN
  Administered 2023-06-15: 150 mg via INTRAVENOUS
  Administered 2023-06-15: 50 mg via INTRAVENOUS

## 2023-06-15 MED ORDER — HYDROMORPHONE HCL 1 MG/ML IJ SOLN: 0.2500 mg | INTRAMUSCULAR | Status: DC | PRN

## 2023-06-15 MED ORDER — FENTANYL CITRATE (PF) 100 MCG/2ML IJ SOLN
INTRAMUSCULAR | Status: DC | PRN
  Administered 2023-06-15 (×2): 25 ug via INTRAVENOUS
  Administered 2023-06-15: 50 ug via INTRAVENOUS

## 2023-06-15 MED ORDER — OXYCODONE HCL 5 MG/5ML PO SOLN: 5.0000 mg | Freq: Once | ORAL | Status: DC | PRN

## 2023-06-15 MED ORDER — LIDOCAINE HCL (CARDIAC) PF 100 MG/5ML IV SOSY
PREFILLED_SYRINGE | INTRAVENOUS | Status: DC | PRN
  Administered 2023-06-15: 60 mg via INTRAVENOUS

## 2023-06-15 MED ORDER — 0.9 % SODIUM CHLORIDE (POUR BTL) OPTIME
TOPICAL | Status: DC | PRN
  Administered 2023-06-15: 1000 mL

## 2023-06-15 MED ORDER — ORAL CARE MOUTH RINSE: 15.0000 mL | Freq: Once | OROMUCOSAL | Status: AC

## 2023-06-15 MED ORDER — PHENYLEPHRINE HCL (PRESSORS) 10 MG/ML IV SOLN
INTRAVENOUS | Status: AC
  Filled 2023-06-15: qty 1

## 2023-06-15 MED ORDER — SODIUM CHLORIDE 0.9 % IR SOLN
Status: DC | PRN
  Administered 2023-06-15 (×2): 3000 mL

## 2023-06-15 MED ORDER — EPHEDRINE SULFATE-NACL 50-0.9 MG/10ML-% IV SOSY
PREFILLED_SYRINGE | INTRAVENOUS | Status: DC | PRN
  Administered 2023-06-15: 5 mg via INTRAVENOUS
  Administered 2023-06-15 (×2): 10 mg via INTRAVENOUS

## 2023-06-15 MED ORDER — PHENYLEPHRINE HCL-NACL 20-0.9 MG/250ML-% IV SOLN
INTRAVENOUS | Status: DC | PRN
Start: 2023-06-15 — End: 2023-06-15
  Administered 2023-06-15: 75 ug/min via INTRAVENOUS

## 2023-06-15 MED ORDER — DEXAMETHASONE SODIUM PHOSPHATE 10 MG/ML IJ SOLN
INTRAMUSCULAR | Status: DC | PRN
  Administered 2023-06-15: 4 mg via INTRAVENOUS

## 2023-06-15 MED ORDER — LACTATED RINGERS IV SOLN: INTRAVENOUS | Status: DC

## 2023-06-15 MED ORDER — PHENAZOPYRIDINE HCL 200 MG PO TABS: 200.0000 mg | ORAL_TABLET | Freq: Three times a day (TID) | ORAL | 0 refills | Status: AC | PRN

## 2023-06-15 MED ORDER — AMISULPRIDE (ANTIEMETIC) 5 MG/2ML IV SOLN: 10.0000 mg | Freq: Once | INTRAVENOUS | Status: DC | PRN

## 2023-06-15 MED ORDER — DEXAMETHASONE SODIUM PHOSPHATE 10 MG/ML IJ SOLN
INTRAMUSCULAR | Status: AC
  Filled 2023-06-15: qty 1

## 2023-06-15 MED ORDER — CHLORHEXIDINE GLUCONATE 0.12 % MT SOLN
15.0000 mL | Freq: Once | OROMUCOSAL | Status: AC
  Administered 2023-06-15: 15 mL via OROMUCOSAL

## 2023-06-15 MED ORDER — PHENYLEPHRINE 80 MCG/ML (10ML) SYRINGE FOR IV PUSH (FOR BLOOD PRESSURE SUPPORT)
PREFILLED_SYRINGE | INTRAVENOUS | Status: AC
  Filled 2023-06-15: qty 10

## 2023-06-15 MED ORDER — HYOSCYAMINE SULFATE 0.125 MG PO TBDP: 0.1250 mg | ORAL_TABLET | Freq: Four times a day (QID) | ORAL | 0 refills | Status: AC | PRN

## 2023-06-15 MED ORDER — GLYCOPYRROLATE 0.2 MG/ML IJ SOLN
INTRAMUSCULAR | Status: DC | PRN
  Administered 2023-06-15: .2 mg via INTRAVENOUS

## 2023-06-15 MED ORDER — INSULIN ASPART 100 UNIT/ML IJ SOLN
INTRAMUSCULAR | Status: AC
  Administered 2023-06-15: 2 [IU] via SUBCUTANEOUS
  Filled 2023-06-15: qty 1

## 2023-06-15 MED ORDER — ONDANSETRON HCL 4 MG/2ML IJ SOLN
INTRAMUSCULAR | Status: AC
  Filled 2023-06-15: qty 2

## 2023-06-15 MED ORDER — EPHEDRINE 5 MG/ML INJ
INTRAVENOUS | Status: AC
  Filled 2023-06-15: qty 5

## 2023-06-15 MED ORDER — TAMSULOSIN HCL 0.4 MG PO CAPS: 0.4000 mg | ORAL_CAPSULE | Freq: Every day | ORAL | 0 refills | Status: AC

## 2023-06-15 MED ORDER — PROPOFOL 10 MG/ML IV BOLUS
INTRAVENOUS | Status: AC
  Filled 2023-06-15: qty 20

## 2023-06-15 MED ORDER — IOHEXOL 300 MG/ML  SOLN
INTRAMUSCULAR | Status: DC | PRN
  Administered 2023-06-15: 20 mL

## 2023-06-15 MED ORDER — SODIUM CHLORIDE 0.9 % IV SOLN: 12.5000 mg | INTRAVENOUS | Status: DC | PRN

## 2023-06-15 MED ORDER — OXYCODONE HCL 5 MG PO TABS: 5.0000 mg | ORAL_TABLET | Freq: Once | ORAL | Status: DC | PRN

## 2023-06-15 SURGICAL SUPPLY — 29 items
BAG COUNTER SPONGE SURGICOUNT (BAG) IMPLANT
BAG URO CATCHER STRL LF (MISCELLANEOUS) ×1 IMPLANT
BASKET STONE NCOMPASS (UROLOGICAL SUPPLIES) IMPLANT
BASKET ZERO TIP NITINOL 2.4FR (BASKET) IMPLANT
CATH URETL OPEN 5X70 (CATHETERS) ×1 IMPLANT
CLOTH BEACON ORANGE TIMEOUT ST (SAFETY) ×1 IMPLANT
EXTRACTOR STONE 1.7FRX115CM (UROLOGICAL SUPPLIES) IMPLANT
FIBER LASER MOSES 200 DFL (Laser) IMPLANT
FIBER LASER MOSES 365 DFL (Laser) IMPLANT
GLOVE BIO SURGEON STRL SZ8 (GLOVE) ×1 IMPLANT
GOWN STRL REUS W/ TWL XL LVL3 (GOWN DISPOSABLE) ×1 IMPLANT
GUIDEWIRE ANG ZIPWIRE 038X150 (WIRE) IMPLANT
GUIDEWIRE STR DUAL SENSOR (WIRE) ×1 IMPLANT
KIT TURNOVER KIT A (KITS) IMPLANT
LASER FIB FLEXIVA PULSE ID 365 (Laser) IMPLANT
LASER FIB FLEXIVA PULSE ID 550 (Laser) IMPLANT
LASER FIB FLEXIVA PULSE ID 910 (Laser) IMPLANT
MANIFOLD NEPTUNE II (INSTRUMENTS) ×1 IMPLANT
MAT HALF PREVALON HALF STRYKER (MISCELLANEOUS) IMPLANT
NS IRRIG 1000ML POUR BTL (IV SOLUTION) IMPLANT
PACK CYSTO (CUSTOM PROCEDURE TRAY) ×1 IMPLANT
SHEATH NAVIGATOR HD 11/13X36 (SHEATH) ×1 IMPLANT
SHEATH NAVIGATOR HD 12/14X28 (SHEATH) IMPLANT
SHEATH NAVIGATOR HD 12/14X36 (SHEATH) IMPLANT
STENT URET 6FRX24 CONTOUR (STENTS) IMPLANT
STENT URET 6FRX26 CONTOUR (STENTS) IMPLANT
TRACTIP FLEXIVA PULS ID 200XHI (Laser) IMPLANT
TUBING CONNECTING 10 (TUBING) ×1 IMPLANT
TUBING UROLOGY SET (TUBING) ×1 IMPLANT

## 2023-06-15 NOTE — Anesthesia Procedure Notes (Signed)
 Procedure Name: LMA Insertion Date/Time: 06/15/2023 8:54 AM  Performed by: Vergia Glasgow, CRNAPre-anesthesia Checklist: Patient identified, Emergency Drugs available, Suction available, Patient being monitored and Timeout performed Patient Re-evaluated:Patient Re-evaluated prior to induction Oxygen  Delivery Method: Circle system utilized Preoxygenation: Pre-oxygenation with 100% oxygen  Induction Type: IV induction Ventilation: Mask ventilation without difficulty LMA: LMA with gastric port inserted LMA Size: 4.0 Number of attempts: 1 Placement Confirmation: positive ETCO2 and breath sounds checked- equal and bilateral Tube secured with: Tape Dental Injury: Teeth and Oropharynx as per pre-operative assessment

## 2023-06-15 NOTE — Anesthesia Preprocedure Evaluation (Signed)
 Anesthesia Evaluation  Patient identified by MRN, date of birth, ID band Patient awake    Reviewed: Allergy & Precautions, NPO status , Patient's Chart, lab work & pertinent test results  Airway Mallampati: III  TM Distance: <3 FB Neck ROM: Full    Dental no notable dental hx.    Pulmonary asthma , sleep apnea , former smoker   Pulmonary exam normal breath sounds clear to auscultation       Cardiovascular hypertension, Pt. on medications + CAD, + Cardiac Stents and +CHF  + dysrhythmias Atrial Fibrillation  Rhythm:Regular Rate:Normal     Neuro/Psych CVA  negative psych ROS   GI/Hepatic Neg liver ROS,GERD  ,,  Endo/Other  diabetes, Type 2  Class 3 obesity  Renal/GU negative Renal ROS  negative genitourinary   Musculoskeletal  (+) Arthritis , Osteoarthritis,    Abdominal  (+) + obese  Peds negative pediatric ROS (+)  Hematology negative hematology ROS (+)   Anesthesia Other Findings   Reproductive/Obstetrics negative OB ROS                             Anesthesia Physical Anesthesia Plan  ASA: 3  Anesthesia Plan: General   Post-op Pain Management: Minimal or no pain anticipated   Induction: Intravenous  PONV Risk Score and Plan: 3 and Treatment may vary due to age or medical condition, Ondansetron , Dexamethasone  and Midazolam  Airway Management Planned: LMA  Additional Equipment:   Intra-op Plan:   Post-operative Plan: Extubation in OR  Informed Consent: I have reviewed the patients History and Physical, chart, labs and discussed the procedure including the risks, benefits and alternatives for the proposed anesthesia with the patient or authorized representative who has indicated his/her understanding and acceptance.     Dental advisory given  Plan Discussed with: CRNA and Surgeon  Anesthesia Plan Comments:         Anesthesia Quick Evaluation

## 2023-06-15 NOTE — Op Note (Addendum)
 Preoperative diagnosis: right ureteral calculus  Postoperative diagnosis: right ureteral calculus  Procedure:  Cystoscopy right ureteroscopy and stone removal Ureteroscopic laser lithotripsy right 71F x 26 ureteral stent placement  right retrograde pyelography with interpretation  Surgeon: Dr. Aimee Alf  Anesthesia: General  Complications: None  Intraoperative findings 1.  Patient with significant cystocele making ureteral orifices significantly below the urethra 2.  Right proximal ureteral stone significantly impacted 3.  All ureteral stone removed from the ureter 4.  6 x 26 double-J stent without strings placed in the right ureter due to stone impaction and inflammation within the ureter EBL: Minimal  Retrograde Pyelogram interpretation: No contrast extravisation, dilated R renal pelvis, wire in proper position, mild narrowing at previous stone locations   Specimens: right ureteral calculus  Disposition of specimens: [facility] for stone analysis  Indication: [name] is a 68 y.o.   patient with a  right ureteral stone and associated right symptoms. After reviewing the management options for treatment, the patient elected to proceed with the above surgical procedure(s). We have discussed the potential benefits and risks of the procedure, side effects of the proposed treatment, the likelihood of the patient achieving the goals of the procedure, and any potential problems that might occur during the procedure or recuperation. Informed consent has been obtained.   Description of procedure:  The patient was taken to the operating room and general anesthesia was induced.  The patient was placed in the dorsal lithotomy position, prepped and draped in the usual sterile fashion, and preoperative antibiotics were administered. A preoperative time-out was performed.   Cystourethroscopy was performed.  The patient's urethra was examined and was normal  although slightly more downward in angle.  This made access very difficult to the right ureteral orifice.  To access the ureteral orifice a finger had to be placed inside the vagina to the left the trigone.  There were no tumors or other abnormalities noted in the bladder.  Attention then turned to the right ureteral orifice where this had to be lifted up via pressure from the vagina to be able to place a sensor wire into it.  A 0.38 sensor guidewire was then advanced up the right ureter into the renal pelvis under fluoroscopic guidance. The 4.5 Fr semirigid ureteroscope was then advanced into the ureter next to the guidewire and the calculus was identified.   The stone was then fragmented with the 200 micron holmium laser fiber on a setting of 0.6 and frequency of 6 Hz.   All stones were then removed from the ureter with an encompass nitinol basket.  Reinspection of the ureter revealed significant inflammation and irritation from impaction at the location of where the stone was previously.  The previous wire had some concern that it had perforated the ureteral wall. A new wire was placed into the kidney and a 22fr ureteral catheter was then placed in the renal pelvis and a retrograde pyelogram was done. Showing wire in correct location and findings noted above. The 50fr was removed and the wore was left in place. The original was was removed with wire int he collecting system being the only wire int he patient.   The wire was then backloaded through the cystoscope and a ureteral stent was advance over the wire using Seldinger technique.  The stent was positioned appropriately under fluoroscopic and cystoscopic guidance.  The wire was then removed with an adequate stent curl noted in the renal pelvis as well as in the bladder.  The bladder was then emptied and the procedure ended.  The patient appeared to tolerate the procedure well and without complications.  The patient was able to be awakened and  transferred to the recovery unit in satisfactory condition.   Disposition:  The patient has been scheduled for followup in 6 weeks with a renal ultrasound.  The patient will be scheduled for stent removal in 2-3 weeks

## 2023-06-15 NOTE — Transfer of Care (Signed)
 Immediate Anesthesia Transfer of Care Note  Patient: [name]  Procedure(s) Performed: CYSTOSCOPY/URETEROSCOPY/HOLMIUM LASER/STENT PLACEMENT (Right) CYSTOSCOPY, WITH RETROGRADE PYELOGRAM (Right)  Patient Location: PACU  Anesthesia Type:General  Level of Consciousness: awake, alert , oriented, and patient cooperative  Airway & Oxygen  Therapy: Patient Spontanous Breathing and Patient connected to face mask oxygen   Post-op Assessment: Report given to RN, Post -op Vital signs reviewed and stable, and B/P 166/62  Post vital signs: Reviewed and stable  Last Vitals:  Vitals Value Taken Time  BP    Temp    Pulse 68 06/15/23 1017  Resp 19 06/15/23 1017  SpO2 100 % 06/15/23 1017    Last Pain:  Vitals:   06/15/23 0758  TempSrc: Oral  PainSc:       Patients Stated Pain Goal: 3 (06/15/23 1610)  Complications: No notable events documented.

## 2023-06-15 NOTE — Discharge Instructions (Signed)
 DISCHARGE INSTRUCTIONS FOR Ureteroscopy and/or Ureteral Stent Placement  MEDICATIONS:  1.  Robaxin 2. Tamsulosin   3. Hyoscyamine  4. Methocarbamol   ACTIVITY:  1. No strenuous activity x 1week  2. No driving while on narcotic pain medications  3. Drink plenty of water   4. Continue to walk at home - it is normal to see blood in the urine while the stent is in place, so keep active, but don't over do it.  5. May return to work/school tomorrow or when you feel ready  6. You may experience some pain when urinating in the kidney on the side that was operated on while the stent is in place this is normal  WHAT IS NORMAL TO EXPERIENCE: It is normal to feel the urge to urinate while the stent is in place It is normal to have blood in your urine while the stent is in place  It sometimes can be normal to have pain in your kidney when you urinate   BATHING:  1. You can shower and we recommend daily showers   DIET: You may return to your normal diet immediately. Because of the raw surface of your bladder, alcohol, spicy foods, acid type foods and drinks with caffeine may cause irritation or frequency and should be used in moderation. To keep your urine flowing freely and to avoid constipation, drink plenty of fluids during the day ( 8-10 glasses ). Tip: Avoid cranberry juice because it is very acidic.  SIGNS/SYMPTOMS TO CALL:  Please call us  if you have a fever greater than 101.5, uncontrolled nausea/vomiting, uncontrolled pain, dizziness, unable to urinate, bloody urine with clots greater than the size of a quarter, chest pain, shortness of breath, leg swelling, leg pain, redness around wound, drainage from wound, or any other concerns or questions.   You can reach us  at [phone].   FOLLOW-UP:  1. You you have been set up for f/u in 6-8 weeks  2. You will have your stent removed in clinic in 2 weeks

## 2023-06-15 NOTE — Anesthesia Postprocedure Evaluation (Signed)
 Anesthesia Post Note  Patient: [name]  Procedure(s) Performed: CYSTOSCOPY/URETEROSCOPY/HOLMIUM LASER/STENT PLACEMENT (Right) CYSTOSCOPY, WITH RETROGRADE PYELOGRAM (Right)     Patient location during evaluation: PACU Anesthesia Type: General Level of consciousness: awake and alert Pain management: pain level controlled Vital Signs Assessment: post-procedure vital signs reviewed and stable Respiratory status: spontaneous breathing, nonlabored ventilation and respiratory function stable Cardiovascular status: blood pressure returned to baseline and stable Postop Assessment: no apparent nausea or vomiting Anesthetic complications: no   No notable events documented.  Last Vitals:  Vitals:   06/15/23 1045 06/15/23 1100  BP: 116/64 114/61  Pulse: 66 63  Resp: 10   Temp:  36.6 C  SpO2: 95% 90%    Last Pain:  Vitals:   06/15/23 1100  TempSrc: Oral  PainSc: 0-No pain                 Earvin Goldberg

## 2023-06-15 NOTE — Progress Notes (Signed)
 Blood sugar was 180, the glucometer was unable to transfer it over.

## 2023-06-16 ENCOUNTER — Encounter (HOSPITAL_COMMUNITY): Payer: Self-pay | Admitting: Urology

## 2023-06-24 LAB — STONE ANALYSIS
Calcium Oxalate Dihydrate: 40 %
Calcium Oxalate Monohydrate: 60 %
Weight Calculi: 3 mg

## 2023-07-07 ENCOUNTER — Other Ambulatory Visit: Payer: Self-pay | Admitting: Interventional Cardiology

## 2023-07-08 ENCOUNTER — Other Ambulatory Visit: Payer: Self-pay | Admitting: Interventional Cardiology

## 2023-07-09 ENCOUNTER — Other Ambulatory Visit: Payer: Self-pay | Admitting: Interventional Cardiology

## 2023-07-13 ENCOUNTER — Other Ambulatory Visit: Payer: Self-pay | Admitting: Interventional Cardiology

## 2023-07-13 NOTE — Telephone Encounter (Signed)
 Prescription refill request for Eliquis  received. Indication:afib Last office visit:3/25 Scr:1.08  4/25 Age: 68 Weight:123.4  kg  Prescription refilled

## 2023-08-10 DIAGNOSIS — M65342 Trigger finger, left ring finger: Secondary | ICD-10-CM | POA: Insufficient documentation

## 2023-08-10 DIAGNOSIS — M65311 Trigger thumb, right thumb: Secondary | ICD-10-CM | POA: Insufficient documentation

## 2023-09-15 NOTE — Therapy (Signed)
 OUTPATIENT PHYSICAL THERAPY LOWER EXTREMITY EVALUATION   Patient Name: [name] MRN: [id number] DOB:Sep 25, 1955, 68 y.o., female Today's Date: 09/16/2023  END OF SESSION:  PT End of Session - 09/16/23 1305     Visit Number 1    Date for PT Re-Evaluation 12/09/23    Authorization Type [employer]    PT Start Time 1305    PT Stop Time 1350    PT Time Calculation (min) 45 min          Past Medical History:  Diagnosis Date   Arthritis    Asthma    CHF (congestive heart failure) (HCC)    Coronary artery disease    Diabetes mellitus    Diabetic neuropathy (HCC)    Dysrhythmia    A.fib    takes Eliquis    GERD (gastroesophageal reflux disease)    History of hiatal hernia    History of kidney stones    Hx of cardiovascular stress test    Lexiscan  Myoview (10/15):  Medium area of ischemia in AL and IL distribution, cannot completely exclude shifting breast attenuation, EF 64%; Abnormal study   Hyperlipidemia    Hypertension    Mild carotid artery disease (HCC)    Morbid obesity (HCC)    PAF (paroxysmal atrial fibrillation) (HCC)    Sleep apnea    does not wear CPAP   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   Syncope    Past Surgical History:  Procedure Laterality Date   ACHILLES TENDON REPAIR  1995   right   BIOPSY  07/22/2021   Procedure: BIOPSY;  Surgeon: Saintclair Jasper, MD;  Location: [facility];  Service: Gastroenterology;;   CATARACT EXTRACTION Bilateral    COLONOSCOPY     COLONOSCOPY WITH PROPOFOL  N/A 07/22/2021   Procedure: COLONOSCOPY WITH PROPOFOL ;  Surgeon: Saintclair Jasper, MD;  Location: [facility];  Service: Gastroenterology;  Laterality: N/A;   CYSTOSCOPY W/ RETROGRADES Right 06/15/2023   Procedure: CYSTOSCOPY, WITH RETROGRADE PYELOGRAM;  Surgeon: Shane Steffan BROCKS, MD;  Location: [facility];  Service: Urology;  Laterality: Right;   CYSTOSCOPY/URETEROSCOPY/HOLMIUM LASER/STENT PLACEMENT Right 06/15/2023   Procedure: CYSTOSCOPY/URETEROSCOPY/HOLMIUM LASER/STENT PLACEMENT;   Surgeon: Shane Steffan BROCKS, MD;  Location: [facility];  Service: Urology;  Laterality: Right;   DILATION AND CURETTAGE OF UTERUS     ESOPHAGOGASTRODUODENOSCOPY (EGD) WITH PROPOFOL  N/A 07/22/2021   Procedure: ESOPHAGOGASTRODUODENOSCOPY (EGD) WITH PROPOFOL ;  Surgeon: Saintclair Jasper, MD;  Location: [facility];  Service: Gastroenterology;  Laterality: N/A;   EYE SURGERY Left    detached retina repair   HAND SURGERY Right    right  pinky finger   ORIF   heart stent  2007   PARS PLANA VITRECTOMY Left 03/24/2017   Procedure: PARS PLANA VITRECTOMY WITH 25 GAUGE;  Surgeon: Elner Arley LABOR, MD;  Location: [facility];  Service: Ophthalmology;  Laterality: Left;   POLYPECTOMY  07/22/2021   Procedure: POLYPECTOMY;  Surgeon: Saintclair Jasper, MD;  Location: [facility];  Service: Gastroenterology;;   UTERINE FIBROID SURGERY  2008   Patient Active Problem List   Diagnosis Date Noted   Paroxysmal atrial fibrillation with RVR (HCC) 11/28/2020   Hypertensive urgency 11/28/2020   Syncope and collapse 11/28/2020   Nausea 11/28/2020   Hypokalemia 11/28/2020   Hyperglycemia due to diabetes mellitus (HCC) 11/28/2020   GERD (gastroesophageal reflux disease) 11/28/2020   Asthma 11/28/2020   Coronary atherosclerosis of native coronary artery 07/17/2013   Mixed hyperlipidemia 07/17/2013   Essential hypertension, benign 07/17/2013   Obesity 08/11/2011    PCP:  Alberta Sharps  REFERRING PROVIDER: Marcey Her  REFERRING DIAG: Pain in L knee  THERAPY DIAG:  Chronic pain of left knee  Bilateral chronic knee pain  Other low back pain  Rationale for Evaluation and Treatment: Rehabilitation  ONSET DATE: chronic  SUBJECTIVE:   SUBJECTIVE STATEMENT: My knees hurt, the left one more. It is to the point where I can't step up or down on the L side. It has started making the back on the R side hurt now. They put a shot in it in June, it doesn't hurt like it did but I still don't have use of it.   PERTINENT  HISTORY: Arthritis, CHF, DM, CAD, PAF, Stroke in 2022  PAIN:  Are you having pain? Yes: NPRS scale: 8/10 Pain location: L knee, R low back  Pain description: ache, dull, can take my breath Aggravating factors: walking, sitting or laying for too long and then trying to get up Relieving factors: nothing really   PRECAUTIONS: None  RED FLAGS: None   WEIGHT BEARING RESTRICTIONS: No  FALLS:  Has patient fallen in last 6 months? No  LIVING ENVIRONMENT: Lives with: lives alone Lives in: House/apartment Stairs: Yes: Internal: steps into the basement steps; on left going up and External: 2 steps; on left going up   OCCUPATION: retired from social services   PLOF: Independent and Independent with basic ADLs  PATIENT GOALS:  to get rid of the pain and be able to walk and move again   NEXT MD VISIT:   OBJECTIVE:  Note: Objective measures were completed at Evaluation unless otherwise noted.  DIAGNOSTIC FINDINGS:   COGNITION: Overall cognitive status: Within functional limits for tasks assessed     SENSATION: WFL   MUSCLE LENGTH: Hamstrings: some tightness in BLE  POSTURE: rounded shoulders  LOWER EXTREMITY ROM: WFL Some pain with hip flexion on both sides in R side LBP Some pain with end range flexion on L knee  LOWER EXTREMITY MMT:  MMT Right eval Left eval  Hip flexion 4- 5  Hip extension    Hip abduction    Hip adduction    Hip internal rotation    Hip external rotation    Knee flexion 4+ 4-  Knee extension 4+ 4+  Ankle dorsiflexion    Ankle plantarflexion    Ankle inversion    Ankle eversion     (Blank rows = not tested)   FUNCTIONAL TESTS:  5 times sit to stand: 24s Timed up and go (TUG): 22s  GAIT: Distance walked: in clinic distances Assistive device utilized: None Level of assistance: Complete Independence and Modified independence Comments: antalgic gait, weight shifting on the RLE, taking quick steps with LLE to avoid using it and weight  bearing                                                                                                                                 TREATMENT DATE:  09/16/23 EVAL    PATIENT EDUCATION:  Education details: POC, HEP Person educated: Patient Education method: Explanation Education comprehension: verbalized understanding  HOME EXERCISE PROGRAM: Access Code: [email or web address] URL: [email or web address] Date: 09/16/2023 Prepared by: Almetta Fam  Exercises - Sit to Stand  - 1 x daily - 7 x weekly - 2 sets - 10 reps - Seated Long Arc Quad  - 1 x daily - 7 x weekly - 2 sets - 10 reps - 3 hold - Heel Raises with Counter Support  - 1 x daily - 7 x weekly - 2 sets - 10 reps - Supine Lower Trunk Rotation  - 1 x daily - 7 x weekly - 2 sets - 10 reps - Supine Bridge  - 1 x daily - 7 x weekly - 2 sets - 10 reps  ASSESSMENT:  CLINICAL IMPRESSION: Patient is a 68 y.o. female who was seen today for physical therapy evaluation and treatment for chronic knee pain. She has pain on both sides but it is mostly the L knee that bothers her. Patient reports walking increases her pain and she is unable to do steps going up or down with the LLE. She compensates with her RLE when walking and due to this it has started causing pain in her R lower back. She has had shots in the L knee and it seemed to help for a little bit but she still has pain mostly with any standing and walking activities. Patient will benefit from PT to address her L knee pain as well as the back pain to increase her activity tolerance and improve strength to allow ease with ADLs.  OBJECTIVE IMPAIRMENTS: Abnormal gait, decreased activity tolerance, decreased balance, decreased endurance, difficulty walking, decreased strength, postural dysfunction, and pain.   ACTIVITY LIMITATIONS: squatting, stairs, and locomotion level  PARTICIPATION LIMITATIONS: meal prep, cleaning, laundry, driving, shopping, community activity, and yard  work  PERSONAL FACTORS: Age, Fitness, Past/current experiences, and Time since onset of injury/illness/exacerbation are also affecting patient's functional outcome.   REHAB POTENTIAL: Good  CLINICAL DECISION MAKING: Stable/uncomplicated  EVALUATION COMPLEXITY: Low  GOALS: Goals reviewed with patient? Yes  SHORT TERM GOALS: Target date: 10/28/23  Patient will be independent with initial HEP. Baseline:  Goal status: INITIAL  2.  Patient will decrease TUG <14s Baseline: 22s Goal status: INITIAL   LONG TERM GOALS: Target date: 12/09/23  Patient will be independent with advanced/ongoing HEP to improve outcomes and carryover.  Baseline:  Goal status: INITIAL  2.  Patient will report at least 75% improvement in  knee pain to improve QOL. (Pain <4/10) Baseline: 8/10 Goal status: INITIAL  3.  Patient will demonstrate improved functional LE strength as demonstrated by 5xSTS <15s. Baseline: 24s Goal status: INITIAL  4. Patient will be able to ascend/descend stairs with 1 HR and reciprocal step pattern safely to access home and community.  Baseline:  Goal status: INITIAL  5.  Patient will tolerate at least 30 min of standing and walking. Baseline: 5-10 mins Goal status: INITIAL    PLAN:  PT FREQUENCY: 2x/week  PT DURATION: 12 weeks  PLANNED INTERVENTIONS: 97110-Therapeutic exercises, 97530- Therapeutic activity, [id number]- Neuromuscular re-education, 97535- Self Care, [id number]- Manual therapy, [id number]- Gait training, [id number]- Vasopneumatic device, [id number]- Ultrasound, [id number]- Ionotophoresis 4mg /ml Dexamethasone , 79439 (1-2 muscles), 20561 (3+ muscles)- Dry Needling, Patient/Family education, Balance training, Stair training, Taping, Joint mobilization, Joint manipulation, Spinal manipulation, Spinal mobilization, Cryotherapy, and Moist heat  PLAN FOR NEXT SESSION: knee strengthening, how is HEP? Functional tasks, low back  stretching    Almetta Fam, PT 09/16/2023, 1:54 PM

## 2023-09-16 ENCOUNTER — Ambulatory Visit: Attending: Orthopedic Surgery

## 2023-09-16 DIAGNOSIS — M25561 Pain in right knee: Secondary | ICD-10-CM | POA: Diagnosis present

## 2023-09-16 DIAGNOSIS — M5459 Other low back pain: Secondary | ICD-10-CM | POA: Diagnosis present

## 2023-09-16 DIAGNOSIS — M25562 Pain in left knee: Secondary | ICD-10-CM | POA: Diagnosis present

## 2023-09-16 DIAGNOSIS — G8929 Other chronic pain: Secondary | ICD-10-CM | POA: Insufficient documentation

## 2023-09-20 ENCOUNTER — Ambulatory Visit

## 2023-09-20 ENCOUNTER — Other Ambulatory Visit: Payer: Self-pay

## 2023-09-20 DIAGNOSIS — M5459 Other low back pain: Secondary | ICD-10-CM

## 2023-09-20 DIAGNOSIS — M25562 Pain in left knee: Secondary | ICD-10-CM | POA: Diagnosis not present

## 2023-09-20 DIAGNOSIS — G8929 Other chronic pain: Secondary | ICD-10-CM

## 2023-09-20 NOTE — Therapy (Signed)
 OUTPATIENT PHYSICAL THERAPY LOWER EXTREMITY TREATMENT   Patient Name: [name] MRN: [id number] DOB:[birth year], 68 y.o., female Today's Date: 09/20/2023  END OF SESSION:  PT End of Session - 09/20/23 0900     Visit Number 2    Date for PT Re-Evaluation 12/09/23    Authorization Type [employer]    Progress Note Due on Visit 10    PT Start Time [phone]    PT Stop Time 0931    PT Time Calculation (min) 40 min    Activity Tolerance Patient tolerated treatment well    Behavior During Therapy WFL for tasks assessed/performed           Past Medical History:  Diagnosis Date   Arthritis    Asthma    CHF (congestive heart failure) (HCC)    Coronary artery disease    Diabetes mellitus    Diabetic neuropathy (HCC)    Dysrhythmia    A.fib    takes Eliquis    GERD (gastroesophageal reflux disease)    History of hiatal hernia    History of kidney stones    Hx of cardiovascular stress test    Lexiscan  Myoview (10/15):  Medium area of ischemia in AL and IL distribution, cannot completely exclude shifting breast attenuation, EF 64%; Abnormal study   Hyperlipidemia    Hypertension    Mild carotid artery disease (HCC)    Morbid obesity (HCC)    PAF (paroxysmal atrial fibrillation) (HCC)    Sleep apnea    does not wear CPAP   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   Syncope    Past Surgical History:  Procedure Laterality Date   ACHILLES TENDON REPAIR  1995   right   BIOPSY  07/22/2021   Procedure: BIOPSY;  Surgeon: Saintclair Jasper, MD;  Location: [facility];  Service: Gastroenterology;;   CATARACT EXTRACTION Bilateral    COLONOSCOPY     COLONOSCOPY WITH PROPOFOL  N/A 07/22/2021   Procedure: COLONOSCOPY WITH PROPOFOL ;  Surgeon: Saintclair Jasper, MD;  Location: [facility];  Service: Gastroenterology;  Laterality: N/A;   CYSTOSCOPY W/ RETROGRADES Right 06/15/2023   Procedure: CYSTOSCOPY, WITH RETROGRADE PYELOGRAM;  Surgeon: Shane Steffan BROCKS, MD;  Location: [facility];  Service: Urology;   Laterality: Right;   CYSTOSCOPY/URETEROSCOPY/HOLMIUM LASER/STENT PLACEMENT Right 06/15/2023   Procedure: CYSTOSCOPY/URETEROSCOPY/HOLMIUM LASER/STENT PLACEMENT;  Surgeon: Shane Steffan BROCKS, MD;  Location: [facility];  Service: Urology;  Laterality: Right;   DILATION AND CURETTAGE OF UTERUS     ESOPHAGOGASTRODUODENOSCOPY (EGD) WITH PROPOFOL  N/A 07/22/2021   Procedure: ESOPHAGOGASTRODUODENOSCOPY (EGD) WITH PROPOFOL ;  Surgeon: Saintclair Jasper, MD;  Location: [facility];  Service: Gastroenterology;  Laterality: N/A;   EYE SURGERY Left    detached retina repair   HAND SURGERY Right    right  pinky finger   ORIF   heart stent  2007   PARS PLANA VITRECTOMY Left 03/24/2017   Procedure: PARS PLANA VITRECTOMY WITH 25 GAUGE;  Surgeon: Elner Arley LABOR, MD;  Location: [facility] OR;  Service: Ophthalmology;  Laterality: Left;   POLYPECTOMY  07/22/2021   Procedure: POLYPECTOMY;  Surgeon: Saintclair Jasper, MD;  Location: [facility];  Service: Gastroenterology;;   UTERINE FIBROID SURGERY  2008   Patient Active Problem List   Diagnosis Date Noted   Paroxysmal atrial fibrillation with RVR (HCC) 11/28/2020   Hypertensive urgency 11/28/2020   Syncope and collapse 11/28/2020   Nausea 11/28/2020   Hypokalemia 11/28/2020   Hyperglycemia due to diabetes mellitus (HCC) 11/28/2020   GERD (gastroesophageal reflux disease) 11/28/2020   Asthma  11/28/2020   Coronary atherosclerosis of native coronary artery 07/17/2013   Mixed hyperlipidemia 07/17/2013   Essential hypertension, benign 07/17/2013   Obesity 08/11/2011    PCP: Alberta Sharps  REFERRING PROVIDER: Marcey Her  REFERRING DIAG: Pain in L knee  THERAPY DIAG:  Chronic pain of left knee  Bilateral chronic knee pain  Other low back pain  Rationale for Evaluation and Treatment: Rehabilitation  ONSET DATE: chronic  SUBJECTIVE:   SUBJECTIVE STATEMENT:09/20/23:  I couldn't get down on the floor so I didn't try the exercises this weekend,  I was hurting too much.  The sit to stand one really hurts I'm not sure I can do it  E:My knees hurt, the left one more. It is to the point where I can't step up or down on the L side. It has started making the back on the R side hurt now. They put a shot in it in June, it doesn't hurt like it did but I still don't have use of it.   PERTINENT HISTORY: Arthritis, CHF, DM, CAD, PAF, Stroke in 2022  PAIN:  Are you having pain? Yes: NPRS scale: 8/10 Pain location: L knee, R low back  Pain description: ache, dull, can take my breath Aggravating factors: walking, sitting or laying for too long and then trying to get up Relieving factors: nothing really   PRECAUTIONS: None  RED FLAGS: None   WEIGHT BEARING RESTRICTIONS: No  FALLS:  Has patient fallen in last 6 months? No  LIVING ENVIRONMENT: Lives with: lives alone Lives in: House/apartment Stairs: Yes: Internal: steps into the basement steps; on left going up and External: 2 steps; on left going up   OCCUPATION: retired from social services   PLOF: Independent and Independent with basic ADLs  PATIENT GOALS:  to get rid of the pain and be able to walk and move again   NEXT MD VISIT:   OBJECTIVE:  Note: Objective measures were completed at Evaluation unless otherwise noted.  DIAGNOSTIC FINDINGS:   COGNITION: Overall cognitive status: Within functional limits for tasks assessed     SENSATION: WFL   MUSCLE LENGTH: Hamstrings: some tightness in BLE  POSTURE: rounded shoulders  LOWER EXTREMITY ROM: WFL Some pain with hip flexion on both sides in R side LBP Some pain with end range flexion on L knee  LOWER EXTREMITY MMT:  MMT Right eval Left eval  Hip flexion 4- 5  Hip extension    Hip abduction    Hip adduction    Hip internal rotation    Hip external rotation    Knee flexion 4+ 4-  Knee extension 4+ 4+  Ankle dorsiflexion    Ankle plantarflexion    Ankle inversion    Ankle eversion     (Blank rows = not tested)   FUNCTIONAL  TESTS:  5 times sit to stand: 24s Timed up and go (TUG): 22s  GAIT: Distance walked: in clinic distances Assistive device utilized: None Level of assistance: Complete Independence and Modified independence Comments: antalgic gait, weight shifting on the RLE, taking quick steps with LLE to avoid using it and weight bearing  TREATMENT DATE:  09/20/23:  Nustep L 4 6 min Reviewed and adapted her initial exercise program,  Practiced sit to stand from hi low table , table elevated, advised pt to try in a firm chair with pillows in the chair to elevate her LTR, pt able to perform with hands under sacrum Bridging attempted, unable due to pain Side lying R for theragun L post hip, glut med, glut max, TFL musculature In sitting utilized large 85 cm physioball for stretching into lumbar flexion, rotation, replicated with st cane as pt has one at home to stretch her L Lower back     09/16/23 EVAL    PATIENT EDUCATION:  Education details: POC, HEP Person educated: Patient Education method: Explanation Education comprehension: verbalized understanding  HOME EXERCISE PROGRAM: Access Code: [facility] URL: [email or web address] Date: 09/16/2023 Prepared by: Almetta Fam  Exercises - Sit to Stand  - 1 x daily - 7 x weekly - 2 sets - 10 reps - Seated Long Arc Quad  - 1 x daily - 7 x weekly - 2 sets - 10 reps - 3 hold - Heel Raises with Counter Support  - 1 x daily - 7 x weekly - 2 sets - 10 reps - Supine Lower Trunk Rotation  - 1 x daily - 7 x weekly - 2 sets - 10 reps - Supine Bridge  - 1 x daily - 7 x weekly - 2 sets - 10 reps  ASSESSMENT:  CLINICAL IMPRESSION:09/20/23: pt in a lot of pain today, tried to adapt her home program for better pain management/ mobility.  She was able to perform somewhat.   She may benefit from some sort of bracing L LE.,or  st cane to off set her L knee pain/weakness.    Eval:Patient is a 68 y.o. female who was seen today for physical therapy evaluation and treatment for chronic knee pain. She has pain on both sides but it is mostly the L knee that bothers her. Patient reports walking increases her pain and she is unable to do steps going up or down with the LLE. She compensates with her RLE when walking and due to this it has started causing pain in her R lower back. She has had shots in the L knee and it seemed to help for a little bit but she still has pain mostly with any standing and walking activities. Patient will benefit from PT to address her L knee pain as well as the back pain to increase her activity tolerance and improve strength to allow ease with ADLs.  OBJECTIVE IMPAIRMENTS: Abnormal gait, decreased activity tolerance, decreased balance, decreased endurance, difficulty walking, decreased strength, postural dysfunction, and pain.   ACTIVITY LIMITATIONS: squatting, stairs, and locomotion level  PARTICIPATION LIMITATIONS: meal prep, cleaning, laundry, driving, shopping, community activity, and yard work  PERSONAL FACTORS: Age, Fitness, Past/current experiences, and Time since onset of injury/illness/exacerbation are also affecting patient's functional outcome.   REHAB POTENTIAL: Good  CLINICAL DECISION MAKING: Stable/uncomplicated  EVALUATION COMPLEXITY: Low  GOALS: Goals reviewed with patient? Yes  SHORT TERM GOALS: Target date: 10/28/23  Patient will be independent with initial HEP. Baseline:  Goal status: INITIAL  2.  Patient will decrease TUG <14s Baseline: 22s Goal status: INITIAL   LONG TERM GOALS: Target date: 12/09/23  Patient will be independent with advanced/ongoing HEP to improve outcomes and carryover.  Baseline:  Goal status: INITIAL  2.  Patient will report at least 75% improvement in  knee pain to improve QOL. (Pain <4/10)  Baseline: 8/10 Goal status: INITIAL  3.   Patient will demonstrate improved functional LE strength as demonstrated by 5xSTS <15s. Baseline: 24s Goal status: INITIAL  4. Patient will be able to ascend/descend stairs with 1 HR and reciprocal step pattern safely to access home and community.  Baseline:  Goal status: INITIAL  5.  Patient will tolerate at least 30 min of standing and walking. Baseline: 5-10 mins Goal status: INITIAL    PLAN:  PT FREQUENCY: 2x/week  PT DURATION: 12 weeks  PLANNED INTERVENTIONS: 97110-Therapeutic exercises, 97530- Therapeutic activity, [id number]- Neuromuscular re-education, 97535- Self Care, [id number]- Manual therapy, [phone]- Gait training, [phone]- Vasopneumatic device, [id number]- Ultrasound, [id number]- Ionotophoresis 4mg /ml Dexamethasone , 79439 (1-2 muscles), [id number] (3+ muscles)- Dry Needling, Patient/Family education, Balance training, Stair training, Taping, Joint mobilization, Joint manipulation, Spinal manipulation, Spinal mobilization, Cryotherapy, and Moist heat  PLAN FOR NEXT SESSION: knee strengthening, how is HEP? Functional tasks, low back stretching    Coltyn Hanning L Melysa Schroyer, PT, DPT, OCS 09/20/2023, 5:58 PM

## 2023-09-22 ENCOUNTER — Ambulatory Visit

## 2023-09-22 ENCOUNTER — Other Ambulatory Visit: Payer: Self-pay

## 2023-09-22 DIAGNOSIS — M25562 Pain in left knee: Secondary | ICD-10-CM | POA: Diagnosis not present

## 2023-09-22 DIAGNOSIS — M5459 Other low back pain: Secondary | ICD-10-CM

## 2023-09-22 DIAGNOSIS — G8929 Other chronic pain: Secondary | ICD-10-CM

## 2023-09-22 NOTE — Therapy (Signed)
 OUTPATIENT PHYSICAL THERAPY LOWER EXTREMITY TREATMENT   Patient Name: JAYDIN JALOMO MRN: [id number] DOB:Jun 09, 1955, 68 y.o., female Today's Date: 09/22/2023  END OF SESSION:  PT End of Session - 09/22/23 0855     Visit Number 3    Date for PT Re-Evaluation 12/09/23    Authorization Type U[employer]    Progress Note Due on Visit 10    PT Start Time [phone]    PT Stop Time 0932    PT Time Calculation (min) 40 min    Activity Tolerance Patient tolerated treatment well    Behavior During Therapy WFL for tasks assessed/performed            Past Medical History:  Diagnosis Date   Arthritis    Asthma    CHF (congestive heart failure) (HCC)    Coronary artery disease    Diabetes mellitus    Diabetic neuropathy (HCC)    Dysrhythmia    A.fib    takes Eliquis    GERD (gastroesophageal reflux disease)    History of hiatal hernia    History of kidney stones    Hx of cardiovascular stress test    Lexiscan  Myoview (10/15):  Medium area of ischemia in AL and IL distribution, cannot completely exclude shifting breast attenuation, EF 64%; Abnormal study   Hyperlipidemia    Hypertension    Mild carotid artery disease (HCC)    Morbid obesity (HCC)    PAF (paroxysmal atrial fibrillation) (HCC)    Sleep apnea    does not wear CPAP   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   Syncope    Past Surgical History:  Procedure Laterality Date   ACHILLES TENDON REPAIR  1995   right   BIOPSY  07/22/2021   Procedure: BIOPSY;  Surgeon: Saintclair Jasper, MD;  Location: [facility];  Service: Gastroenterology;;   CATARACT EXTRACTION Bilateral    COLONOSCOPY     COLONOSCOPY WITH PROPOFOL  N/A 07/22/2021   Procedure: COLONOSCOPY WITH PROPOFOL ;  Surgeon: Saintclair Jasper, MD;  Location: [facility];  Service: Gastroenterology;  Laterality: N/A;   CYSTOSCOPY W/ RETROGRADES Right 06/15/2023   Procedure: CYSTOSCOPY, WITH RETROGRADE PYELOGRAM;  Surgeon: Shane Steffan BROCKS, MD;  Location: [facility];  Service: Urology;   Laterality: Right;   CYSTOSCOPY/URETEROSCOPY/HOLMIUM LASER/STENT PLACEMENT Right 06/15/2023   Procedure: CYSTOSCOPY/URETEROSCOPY/HOLMIUM LASER/STENT PLACEMENT;  Surgeon: Shane Steffan BROCKS, MD;  Location: [facility];  Service: Urology;  Laterality: Right;   DILATION AND CURETTAGE OF UTERUS     ESOPHAGOGASTRODUODENOSCOPY (EGD) WITH PROPOFOL  N/A 07/22/2021   Procedure: ESOPHAGOGASTRODUODENOSCOPY (EGD) WITH PROPOFOL ;  Surgeon: Saintclair Jasper, MD;  Location: [facility];  Service: Gastroenterology;  Laterality: N/A;   EYE SURGERY Left    detached retina repair   HAND SURGERY Right    right  pinky finger   ORIF   heart stent  2007   PARS PLANA VITRECTOMY Left 03/24/2017   Procedure: PARS PLANA VITRECTOMY WITH 25 GAUGE;  Surgeon: Elner Arley LABOR, MD;  Location: [facility] OR;  Service: Ophthalmology;  Laterality: Left;   POLYPECTOMY  07/22/2021   Procedure: POLYPECTOMY;  Surgeon: Saintclair Jasper, MD;  Location: [facility];  Service: Gastroenterology;;   UTERINE FIBROID SURGERY  2008   Patient Active Problem List   Diagnosis Date Noted   Paroxysmal atrial fibrillation with RVR (HCC) 11/28/2020   Hypertensive urgency 11/28/2020   Syncope and collapse 11/28/2020   Nausea 11/28/2020   Hypokalemia 11/28/2020   Hyperglycemia due to diabetes mellitus (HCC) 11/28/2020   GERD (gastroesophageal reflux disease) 11/28/2020  Asthma 11/28/2020   Coronary atherosclerosis of native coronary artery 07/17/2013   Mixed hyperlipidemia 07/17/2013   Essential hypertension, benign 07/17/2013   Obesity 08/11/2011    PCP: Alberta Sharps  REFERRING PROVIDER: Marcey Her  REFERRING DIAG: Pain in L knee  THERAPY DIAG:  Chronic pain of left knee  Bilateral chronic knee pain  Other low back pain  Rationale for Evaluation and Treatment: Rehabilitation  ONSET DATE: chronic  SUBJECTIVE:   SUBJECTIVE STATEMENT:09/22/23:  I'm feeling like my L knee is going to collapse and I might fall when I go down the steps leading with  that leg. My R lower back really hurts when I try to lie down. E:My knees hurt, the left one more. It is to the point where I can't step up or down on the L side. It has started making the back on the R side hurt now. They put a shot in it in June, it doesn't hurt like it did but I still don't have use of it.   PERTINENT HISTORY: Arthritis, CHF, DM, CAD, PAF, Stroke in 2022  PAIN:  Are you having pain? Yes: NPRS scale: 8/10 Pain location: L knee, R low back  Pain description: ache, dull, can take my breath Aggravating factors: walking, sitting or laying for too long and then trying to get up Relieving factors: nothing really   PRECAUTIONS: None  RED FLAGS: None   WEIGHT BEARING RESTRICTIONS: No  FALLS:  Has patient fallen in last 6 months? No  LIVING ENVIRONMENT: Lives with: lives alone Lives in: House/apartment Stairs: Yes: Internal: steps into the basement steps; on left going up and External: 2 steps; on left going up   OCCUPATION: retired from social services   PLOF: Independent and Independent with basic ADLs  PATIENT GOALS:  to get rid of the pain and be able to walk and move again   NEXT MD VISIT:   OBJECTIVE:  Note: Objective measures were completed at Evaluation unless otherwise noted.  DIAGNOSTIC FINDINGS:   COGNITION: Overall cognitive status: Within functional limits for tasks assessed     SENSATION: WFL   MUSCLE LENGTH: Hamstrings: some tightness in BLE  POSTURE: rounded shoulders  LOWER EXTREMITY ROM: WFL Some pain with hip flexion on both sides in R side LBP Some pain with end range flexion on L knee  LOWER EXTREMITY MMT:  MMT Right eval Left eval  Hip flexion 4- 5  Hip extension    Hip abduction    Hip adduction    Hip internal rotation    Hip external rotation    Knee flexion 4+ 4-  Knee extension 4+ 4+  Ankle dorsiflexion    Ankle plantarflexion    Ankle inversion    Ankle eversion     (Blank rows = not  tested)   FUNCTIONAL TESTS:  5 times sit to stand: 24s Timed up and go (TUG): 22s  GAIT: Distance walked: in clinic distances Assistive device utilized: None Level of assistance: Complete Independence and Modified independence Comments: antalgic gait, weight shifting on the RLE, taking quick steps with LLE to avoid using it and weight bearing  TREATMENT DATE:  09/22/23: Manual:  Initially supine with B Le's elevated on wedge, cross friction massage L med and lat jt line, distal quads. Also theragun massage L quads  Side lying L for theragun deep massage B lumbar paraspinals Supine for AP glides L tibia to improve L knee flexion ROM/tolerance  Therex:  Nustep Level 3, x 6 min B knee ext 5# 3 x 10   09/20/23:  Nustep L 4 6 min Reviewed and adapted her initial exercise program,  Practiced sit to stand from hi low table , table elevated, advised pt to try in a firm chair with pillows in the chair to elevate her LTR, pt able to perform with hands under sacrum Bridging attempted, unable due to pain Side lying R for theragun L post hip, glut med, glut max, TFL musculature In sitting utilized large 85 cm physioball for stretching into lumbar flexion, rotation, replicated with st cane as pt has one at home to stretch her L Lower back     09/16/23 EVAL    PATIENT EDUCATION:  Education details: POC, HEP Person educated: Patient Education method: Explanation Education comprehension: verbalized understanding  HOME EXERCISE PROGRAM: Access Code: [facility] URL: [email or web address] Date: 09/16/2023 Prepared by: Almetta Fam  Exercises - Sit to Stand  - 1 x daily - 7 x weekly - 2 sets - 10 reps - Seated Long Arc Quad  - 1 x daily - 7 x weekly - 2 sets - 10 reps - 3 hold - Heel Raises with Counter Support  - 1 x daily - 7 x weekly - 2 sets - 10  reps - Supine Lower Trunk Rotation  - 1 x daily - 7 x weekly - 2 sets - 10 reps - Supine Bridge  - 1 x daily - 7 x weekly - 2 sets - 10 reps  ASSESSMENT:  CLINICAL IMPRESSION:09/22/23: pt still in pain today, primarily L knee,  gently progressed strengthening .  We  discussed trial of aquatic PT due to pain in multiple sites and with weight bearing.  We also discussed possible follow up with orthopedist.  She states she did see an orthopedist recently for injection L knee but cannot recall when and unclear whether the injection helped.  She does need strengthening B LE's to support her L knee. She is agreeable to aquatic PT so will plan to schedule her in Sept when they have openings.    Eval:Patient is a 68 y.o. female who was seen today for physical therapy evaluation and treatment for chronic knee pain. She has pain on both sides but it is mostly the L knee that bothers her. Patient reports walking increases her pain and she is unable to do steps going up or down with the LLE. She compensates with her RLE when walking and due to this it has started causing pain in her R lower back. She has had shots in the L knee and it seemed to help for a little bit but she still has pain mostly with any standing and walking activities. Patient will benefit from PT to address her L knee pain as well as the back pain to increase her activity tolerance and improve strength to allow ease with ADLs.  OBJECTIVE IMPAIRMENTS: Abnormal gait, decreased activity tolerance, decreased balance, decreased endurance, difficulty walking, decreased strength, postural dysfunction, and pain.   ACTIVITY LIMITATIONS: squatting, stairs, and locomotion level  PARTICIPATION LIMITATIONS: meal prep, cleaning, laundry, driving, shopping, community activity, and yard work  PERSONAL FACTORS: Age,  Fitness, Past/current experiences, and Time since onset of injury/illness/exacerbation are also affecting patient's functional outcome.   REHAB  POTENTIAL: Good  CLINICAL DECISION MAKING: Stable/uncomplicated  EVALUATION COMPLEXITY: Low  GOALS: Goals reviewed with patient? Yes  SHORT TERM GOALS: Target date: 10/28/23  Patient will be independent with initial HEP. Baseline:  Goal status: INITIAL  2.  Patient will decrease TUG <14s Baseline: 22s Goal status: INITIAL   LONG TERM GOALS: Target date: 12/09/23  Patient will be independent with advanced/ongoing HEP to improve outcomes and carryover.  Baseline:  Goal status: INITIAL  2.  Patient will report at least 75% improvement in  knee pain to improve QOL. (Pain <4/10) Baseline: 8/10 Goal status: INITIAL  3.  Patient will demonstrate improved functional LE strength as demonstrated by 5xSTS <15s. Baseline: 24s Goal status: INITIAL  4. Patient will be able to ascend/descend stairs with 1 HR and reciprocal step pattern safely to access home and community.  Baseline:  Goal status: INITIAL  5.  Patient will tolerate at least 30 min of standing and walking. Baseline: 5-10 mins Goal status: INITIAL    PLAN:  PT FREQUENCY: 2x/week  PT DURATION: 12 weeks  PLANNED INTERVENTIONS: 97110-Therapeutic exercises, 97530- Therapeutic activity, 97112- Neuromuscular re-education, 97535- Self Care, 02859- Manual therapy, [phone]- Gait training, [phone]- Vasopneumatic device, [id number]- Ultrasound, [id number]- Ionotophoresis 4mg /ml Dexamethasone , 79439 (1-2 muscles), [phone] (3+ muscles)- Dry Needling, Patient/Family education, Balance training, Stair training, Taping, Joint mobilization, Joint manipulation, Spinal manipulation, Spinal mobilization, Cryotherapy, and Moist heat  PLAN FOR NEXT SESSION: knee strengthening, Functional tasks, low back stretching    Lorien Shingler L Avis Tirone, PT, DPT, OCS 09/22/2023, 12:08 PM

## 2023-09-27 ENCOUNTER — Ambulatory Visit

## 2023-09-27 ENCOUNTER — Other Ambulatory Visit: Payer: Self-pay

## 2023-09-27 DIAGNOSIS — G8929 Other chronic pain: Secondary | ICD-10-CM

## 2023-09-27 DIAGNOSIS — M5459 Other low back pain: Secondary | ICD-10-CM

## 2023-09-27 DIAGNOSIS — M25562 Pain in left knee: Secondary | ICD-10-CM | POA: Diagnosis not present

## 2023-09-27 NOTE — Therapy (Signed)
 OUTPATIENT PHYSICAL THERAPY LOWER EXTREMITY TREATMENT   Patient Name: [name] MRN: [id number] DOB:11-23-1955, 68 y.o., female Today's Date: 09/27/2023  END OF SESSION:  PT End of Session - 09/27/23 0852     Visit Number 4    Date for PT Re-Evaluation 12/09/23    Authorization Type U[employer]    Progress Note Due on Visit 10    PT Start Time [id number]    PT Stop Time 0930    PT Time Calculation (min) 38 min    Activity Tolerance Patient tolerated treatment well    Behavior During Therapy WFL for tasks assessed/performed             Past Medical History:  Diagnosis Date   Arthritis    Asthma    CHF (congestive heart failure) (HCC)    Coronary artery disease    Diabetes mellitus    Diabetic neuropathy (HCC)    Dysrhythmia    A.fib    takes Eliquis    GERD (gastroesophageal reflux disease)    History of hiatal hernia    History of kidney stones    Hx of cardiovascular stress test    Lexiscan  Myoview (10/15):  Medium area of ischemia in AL and IL distribution, cannot completely exclude shifting breast attenuation, EF 64%; Abnormal study   Hyperlipidemia    Hypertension    Mild carotid artery disease (HCC)    Morbid obesity (HCC)    PAF (paroxysmal atrial fibrillation) (HCC)    Sleep apnea    does not wear CPAP   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   Syncope    Past Surgical History:  Procedure Laterality Date   ACHILLES TENDON REPAIR  1995   right   BIOPSY  07/22/2021   Procedure: BIOPSY;  Surgeon: Saintclair Jasper, MD;  Location: [facility];  Service: Gastroenterology;;   CATARACT EXTRACTION Bilateral    COLONOSCOPY     COLONOSCOPY WITH PROPOFOL  N/A 07/22/2021   Procedure: COLONOSCOPY WITH PROPOFOL ;  Surgeon: Saintclair Jasper, MD;  Location: [facility];  Service: Gastroenterology;  Laterality: N/A;   CYSTOSCOPY W/ RETROGRADES Right 06/15/2023   Procedure: CYSTOSCOPY, WITH RETROGRADE PYELOGRAM;  Surgeon: Shane Steffan BROCKS, MD;  Location: [facility];  Service: Urology;   Laterality: Right;   CYSTOSCOPY/URETEROSCOPY/HOLMIUM LASER/STENT PLACEMENT Right 06/15/2023   Procedure: CYSTOSCOPY/URETEROSCOPY/HOLMIUM LASER/STENT PLACEMENT;  Surgeon: Shane Steffan BROCKS, MD;  Location: [facility];  Service: Urology;  Laterality: Right;   DILATION AND CURETTAGE OF UTERUS     ESOPHAGOGASTRODUODENOSCOPY (EGD) WITH PROPOFOL  N/A 07/22/2021   Procedure: ESOPHAGOGASTRODUODENOSCOPY (EGD) WITH PROPOFOL ;  Surgeon: Saintclair Jasper, MD;  Location: [facility];  Service: Gastroenterology;  Laterality: N/A;   EYE SURGERY Left    detached retina repair   HAND SURGERY Right    right  pinky finger   ORIF   heart stent  2007   PARS PLANA VITRECTOMY Left 03/24/2017   Procedure: PARS PLANA VITRECTOMY WITH 25 GAUGE;  Surgeon: Elner Arley LABOR, MD;  Location: [facility];  Service: Ophthalmology;  Laterality: Left;   POLYPECTOMY  07/22/2021   Procedure: POLYPECTOMY;  Surgeon: Saintclair Jasper, MD;  Location: [facility];  Service: Gastroenterology;;   UTERINE FIBROID SURGERY  2008   Patient Active Problem List   Diagnosis Date Noted   Paroxysmal atrial fibrillation with RVR (HCC) 11/28/2020   Hypertensive urgency 11/28/2020   Syncope and collapse 11/28/2020   Nausea 11/28/2020   Hypokalemia 11/28/2020   Hyperglycemia due to diabetes mellitus (HCC) 11/28/2020   GERD (gastroesophageal reflux disease) 11/28/2020  Asthma 11/28/2020   Coronary atherosclerosis of native coronary artery 07/17/2013   Mixed hyperlipidemia 07/17/2013   Essential hypertension, benign 07/17/2013   Obesity 08/11/2011    PCP: Alberta Sharps  REFERRING PROVIDER: Marcey Her  REFERRING DIAG: Pain in L knee  THERAPY DIAG:  Chronic pain of left knee  Bilateral chronic knee pain  Other low back pain  Rationale for Evaluation and Treatment: Rehabilitation  ONSET DATE: chronic  SUBJECTIVE:   SUBJECTIVE STATEMENT:09/27/23:  I'm feeling like my L knee is still cracking.  Have to be careful when I step down on it E:My knees  hurt, the left one more. It is to the point where I can't step up or down on the L side. It has started making the back on the R side hurt now. They put a shot in it in June, it doesn't hurt like it did but I still don't have use of it.   PERTINENT HISTORY: Arthritis, CHF, DM, CAD, PAF, Stroke in 2022  PAIN:  Are you having pain? Yes: NPRS scale: 8/10 Pain location: L knee, R low back  Pain description: ache, dull, can take my breath Aggravating factors: walking, sitting or laying for too long and then trying to get up Relieving factors: nothing really   PRECAUTIONS: None  RED FLAGS: None   WEIGHT BEARING RESTRICTIONS: No  FALLS:  Has patient fallen in last 6 months? No  LIVING ENVIRONMENT: Lives with: lives alone Lives in: House/apartment Stairs: Yes: Internal: steps into the basement steps; on left going up and External: 2 steps; on left going up   OCCUPATION: retired from social services   PLOF: Independent and Independent with basic ADLs  PATIENT GOALS:  to get rid of the pain and be able to walk and move again   NEXT MD VISIT:   OBJECTIVE:  Note: Objective measures were completed at Evaluation unless otherwise noted.  DIAGNOSTIC FINDINGS:   COGNITION: Overall cognitive status: Within functional limits for tasks assessed     SENSATION: WFL   MUSCLE LENGTH: Hamstrings: some tightness in BLE  POSTURE: rounded shoulders  LOWER EXTREMITY ROM: WFL Some pain with hip flexion on both sides in R side LBP Some pain with end range flexion on L knee  LOWER EXTREMITY MMT:  MMT Right eval Left eval  Hip flexion 4- 5  Hip extension    Hip abduction    Hip adduction    Hip internal rotation    Hip external rotation    Knee flexion 4+ 4-  Knee extension 4+ 4+  Ankle dorsiflexion    Ankle plantarflexion    Ankle inversion    Ankle eversion     (Blank rows = not tested)   FUNCTIONAL TESTS:  5 times sit to stand: 24s Timed up and go (TUG):  22s  GAIT: Distance walked: in clinic distances Assistive device utilized: None Level of assistance: Complete Independence and Modified independence Comments: antalgic gait, weight shifting on the RLE, taking quick steps with LLE to avoid using it and weight bearing  TREATMENT DATE:  09/27/23: Therapeutic activities:  attempted gait training with st cane to relieve pressure L knee and R lower back, pt didn't really accommodate to the cane. Nustep L 4 x 6 min Ue's and LE's Supine with 65 cm ball under thighs, B knee to chest, LTR, bridging Sit to stand from elevated mat 2 x 5 B knee ext 5#, 5 reps, much pain reported L knee so discontinued  Manual:  Side lying L for deep tissue massage R gluteals, post SI jt line, lumbar paraspinals, lat hip musculature Supine with bolster under L thigh for gentle AP tibial glides, also distraction L heel combined with quad sets to improve knee extension motion Supine for deep tissue massage L quads, emphasis more on medial distal quads     09/22/23: Manual:  Initially supine with B Le's elevated on wedge, cross friction massage L med and lat jt line, distal quads. Also theragun massage L quads  Side lying L for theragun deep massage B lumbar paraspinals Supine for AP glides L tibia to improve L knee flexion ROM/tolerance  Therex:  Nustep Level 3, x 6 min B knee ext 5# 3 x 10   09/20/23:  Nustep L 4 6 min Reviewed and adapted her initial exercise program,  Practiced sit to stand from hi low table , table elevated, advised pt to try in a firm chair with pillows in the chair to elevate her LTR, pt able to perform with hands under sacrum Bridging attempted, unable due to pain Side lying R for theragun L post hip, glut med, glut max, TFL musculature In sitting utilized large 85 cm physioball for stretching into lumbar  flexion, rotation, replicated with st cane as pt has one at home to stretch her L Lower back     09/16/23 EVAL    PATIENT EDUCATION:  Education details: POC, HEP Person educated: Patient Education method: Explanation Education comprehension: verbalized understanding  HOME EXERCISE PROGRAM: Access Code: [facility] URL: [email or web address] Date: 09/16/2023 Prepared by: Almetta Fam  Exercises - Sit to Stand  - 1 x daily - 7 x weekly - 2 sets - 10 reps - Seated Long Arc Quad  - 1 x daily - 7 x weekly - 2 sets - 10 reps - 3 hold - Heel Raises with Counter Support  - 1 x daily - 7 x weekly - 2 sets - 10 reps - Supine Lower Trunk Rotation  - 1 x daily - 7 x weekly - 2 sets - 10 reps - Supine Bridge  - 1 x daily - 7 x weekly - 2 sets - 10 reps  ASSESSMENT:  CLINICAL IMPRESSION:09/27/23:  The pt is walking with more fluid gait today, able to demonstrate improved efficiency with bed mobility as well.  She does need strengthening B LE's to support her L knee. She is still agreeable to aquatic PT so will plan to schedule her in Sept when they have openings.  Continued today with combinations of manual techniques and therapeutic activities to engage lumbar mobility and B LE strength.   Eval:Patient is a 68 y.o. female who was seen today for physical therapy evaluation and treatment for chronic knee pain. She has pain on both sides but it is mostly the L knee that bothers her. Patient reports walking increases her pain and she is unable to do steps going up or down with the LLE. She compensates with her RLE when walking and due to this it has started causing pain in her R  lower back. She has had shots in the L knee and it seemed to help for a little bit but she still has pain mostly with any standing and walking activities. Patient will benefit from PT to address her L knee pain as well as the back pain to increase her activity tolerance and improve strength to allow ease with  ADLs.  OBJECTIVE IMPAIRMENTS: Abnormal gait, decreased activity tolerance, decreased balance, decreased endurance, difficulty walking, decreased strength, postural dysfunction, and pain.   ACTIVITY LIMITATIONS: squatting, stairs, and locomotion level  PARTICIPATION LIMITATIONS: meal prep, cleaning, laundry, driving, shopping, community activity, and yard work  PERSONAL FACTORS: Age, Fitness, Past/current experiences, and Time since onset of injury/illness/exacerbation are also affecting patient's functional outcome.   REHAB POTENTIAL: Good  CLINICAL DECISION MAKING: Stable/uncomplicated  EVALUATION COMPLEXITY: Low  GOALS: Goals reviewed with patient? Yes  SHORT TERM GOALS: Target date: 10/28/23  Patient will be independent with initial HEP. Baseline:  Goal status: INITIAL  2.  Patient will decrease TUG <14s Baseline: 22s Goal status: INITIAL   LONG TERM GOALS: Target date: 12/09/23  Patient will be independent with advanced/ongoing HEP to improve outcomes and carryover.  Baseline:  Goal status: INITIAL  2.  Patient will report at least 75% improvement in  knee pain to improve QOL. (Pain <4/10) Baseline: 8/10 Goal status: INITIAL  3.  Patient will demonstrate improved functional LE strength as demonstrated by 5xSTS <15s. Baseline: 24s Goal status: INITIAL  4. Patient will be able to ascend/descend stairs with 1 HR and reciprocal step pattern safely to access home and community.  Baseline:  Goal status: INITIAL  5.  Patient will tolerate at least 30 min of standing and walking. Baseline: 5-10 mins Goal status: INITIAL    PLAN:  PT FREQUENCY: 2x/week  PT DURATION: 12 weeks  PLANNED INTERVENTIONS: 97110-Therapeutic exercises, 97530- Therapeutic activity, 97112- Neuromuscular re-education, 97535- Self Care, 02859- Manual therapy, [phone]- Gait training, [phone]- Vasopneumatic device, [id number]- Ultrasound, [id number]- Ionotophoresis 4mg /ml Dexamethasone , 79439 (1-2 muscles),  20561 (3+ muscles)- Dry Needling, Patient/Family education, Balance training, Stair training, Taping, Joint mobilization, Joint manipulation, Spinal manipulation, Spinal mobilization, Cryotherapy, and Moist heat  PLAN FOR NEXT SESSION: knee strengthening, Functional tasks, low back stretching    Lyndall Windt L Velmer Woelfel, PT, DPT, OCS 09/27/2023, 10:19 AM

## 2023-09-29 ENCOUNTER — Ambulatory Visit

## 2023-09-29 ENCOUNTER — Other Ambulatory Visit: Payer: Self-pay

## 2023-09-29 DIAGNOSIS — M25562 Pain in left knee: Secondary | ICD-10-CM | POA: Diagnosis not present

## 2023-09-29 DIAGNOSIS — G8929 Other chronic pain: Secondary | ICD-10-CM

## 2023-09-29 DIAGNOSIS — M5459 Other low back pain: Secondary | ICD-10-CM

## 2023-09-29 NOTE — Therapy (Signed)
 OUTPATIENT PHYSICAL THERAPY LOWER EXTREMITY TREATMENT   Patient Name: SHAKTI FLEER MRN: [id number] DOB:17-May-1955, 68 y.o., female Today's Date: 09/29/2023  END OF SESSION:  PT End of Session - 09/29/23 0851     Visit Number 5    Date for PT Re-Evaluation 12/09/23    Authorization Type U[employer]    Progress Note Due on Visit 10    PT Start Time [phone]    PT Stop Time 0930    PT Time Calculation (min) 44 min    Activity Tolerance Patient tolerated treatment well    Behavior During Therapy WFL for tasks assessed/performed              Past Medical History:  Diagnosis Date   Arthritis    Asthma    CHF (congestive heart failure) (HCC)    Coronary artery disease    Diabetes mellitus    Diabetic neuropathy (HCC)    Dysrhythmia    A.fib    takes Eliquis    GERD (gastroesophageal reflux disease)    History of hiatal hernia    History of kidney stones    Hx of cardiovascular stress test    Lexiscan  Myoview (10/15):  Medium area of ischemia in AL and IL distribution, cannot completely exclude shifting breast attenuation, EF 64%; Abnormal study   Hyperlipidemia    Hypertension    Mild carotid artery disease (HCC)    Morbid obesity (HCC)    PAF (paroxysmal atrial fibrillation) (HCC)    Sleep apnea    does not wear CPAP   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   Syncope    Past Surgical History:  Procedure Laterality Date   ACHILLES TENDON REPAIR  1995   right   BIOPSY  07/22/2021   Procedure: BIOPSY;  Surgeon: Saintclair Jasper, MD;  Location: [facility];  Service: Gastroenterology;;   CATARACT EXTRACTION Bilateral    COLONOSCOPY     COLONOSCOPY WITH PROPOFOL  N/A 07/22/2021   Procedure: COLONOSCOPY WITH PROPOFOL ;  Surgeon: Saintclair Jasper, MD;  [facility]: [facility];  Service: Gastroenterology;  Laterality: N/A;   CYSTOSCOPY W/ RETROGRADES Right 06/15/2023   Procedure: CYSTOSCOPY, WITH RETROGRADE PYELOGRAM;  Surgeon: Shane Steffan BROCKS, MD;  Location: [facility];  Service: Urology;   Laterality: Right;   CYSTOSCOPY/URETEROSCOPY/HOLMIUM LASER/STENT PLACEMENT Right 06/15/2023   Procedure: CYSTOSCOPY/URETEROSCOPY/HOLMIUM LASER/STENT PLACEMENT;  Surgeon: Shane Steffan BROCKS, MD;  Location: [facility];  Service: Urology;  Laterality: Right;   DILATION AND CURETTAGE OF UTERUS     ESOPHAGOGASTRODUODENOSCOPY (EGD) WITH PROPOFOL  N/A 07/22/2021   Procedure: ESOPHAGOGASTRODUODENOSCOPY (EGD) WITH PROPOFOL ;  Surgeon: Saintclair Jasper, MD;  Location: [facility];  Service: Gastroenterology;  Laterality: N/A;   EYE SURGERY Left    detached retina repair   HAND SURGERY Right    right  pinky finger   ORIF   heart stent  2007   PARS PLANA VITRECTOMY Left 03/24/2017   Procedure: PARS PLANA VITRECTOMY WITH 25 GAUGE;  Surgeon: Elner Arley LABOR, MD;  Location: [facility];  Service: Ophthalmology;  Laterality: Left;   POLYPECTOMY  07/22/2021   Procedure: POLYPECTOMY;  Surgeon: Saintclair Jasper, MD;  [facility]: [facility];  Service: Gastroenterology;;   UTERINE FIBROID SURGERY  2008   Patient Active Problem List   Diagnosis Date Noted   Paroxysmal atrial fibrillation with RVR (HCC) 11/28/2020   Hypertensive urgency 11/28/2020   Syncope and collapse 11/28/2020   Nausea 11/28/2020   Hypokalemia 11/28/2020   Hyperglycemia due to diabetes mellitus (HCC) 11/28/2020   GERD (gastroesophageal reflux disease) 11/28/2020  Asthma 11/28/2020   Coronary atherosclerosis of native coronary artery 07/17/2013   Mixed hyperlipidemia 07/17/2013   Essential hypertension, benign 07/17/2013   Obesity 08/11/2011    PCP: Alberta Sharps  REFERRING PROVIDER: Marcey Her  REFERRING DIAG: Pain in L knee  THERAPY DIAG:  Chronic pain of left knee  Bilateral chronic knee pain  Other low back pain  Rationale for Evaluation and Treatment: Rehabilitation  ONSET DATE: chronic  SUBJECTIVE:   SUBJECTIVE STATEMENT:09/29/23:  tried cane when I was walking out the other day to, some relief L knee with the cane   IE:My knees  hurt, the left one more. It is to the point where I can't step up or down on the L side. It has started making the back on the R side hurt now. They put a shot in it in June, it doesn't hurt like it did but I still don't have use of it.   PERTINENT HISTORY: Arthritis, CHF, DM, CAD, PAF, Stroke in 2022  PAIN:  Are you having pain? Yes: NPRS scale: 8/10 Pain location: L knee, R low back  Pain description: ache, dull, can take my breath Aggravating factors: walking, sitting or laying for too long and then trying to get up Relieving factors: nothing really   PRECAUTIONS: None  RED FLAGS: None   WEIGHT BEARING RESTRICTIONS: No  FALLS:  Has patient fallen in last 6 months? No  LIVING ENVIRONMENT: Lives with: lives alone Lives in: House/apartment Stairs: Yes: Internal: steps into the basement steps; on left going up and External: 2 steps; on left going up   OCCUPATION: retired from social services   PLOF: Independent and Independent with basic ADLs  PATIENT GOALS:  to get rid of the pain and be able to walk and move again   NEXT MD VISIT:   OBJECTIVE:  Note: Objective measures were completed at Evaluation unless otherwise noted.  DIAGNOSTIC FINDINGS:   COGNITION: Overall cognitive status: Within functional limits for tasks assessed     SENSATION: WFL   MUSCLE LENGTH: Hamstrings: some tightness in BLE  POSTURE: rounded shoulders  LOWER EXTREMITY ROM: WFL Some pain with hip flexion on both sides in R side LBP Some pain with end range flexion on L knee  LOWER EXTREMITY MMT:  MMT Right eval Left eval  Hip flexion 4- 5  Hip extension    Hip abduction    Hip adduction    Hip internal rotation    Hip external rotation    Knee flexion 4+ 4-  Knee extension 4+ 4+  Ankle dorsiflexion    Ankle plantarflexion    Ankle inversion    Ankle eversion     (Blank rows = not tested)   FUNCTIONAL TESTS:  5 times sit to stand: 24s Timed up and go (TUG):  22s  GAIT: Distance walked: in clinic distances Assistive device utilized: None Level of assistance: Complete Independence and Modified independence Comments: antalgic gait, weight shifting on the RLE, taking quick steps with LLE to avoid using it and weight bearing  TREATMENT DATE:  09/29/23:  Therapeutic activities:  Nustep L 5 6 min Ue's and LE's Long arc quads 2# 10 x 3 each Hamstring curls green t band 10 x 3 each Supine with 65 cm ball under thighs, B knee to chest, LTR, bridging Sit to stand from elevated hi/low table 5 x 2  L side lying for theragun massage R post/lat hip and lumbar paraspinals Supine for theragun massage L quads   09/27/23: Therapeutic activities:  attempted gait training with st cane to relieve pressure L knee and R lower back, pt didn't really accommodate to the cane. Nustep L 4 x 6 min Ue's and LE's Supine with 65 cm ball under thighs, B knee to chest, LTR, bridging Sit to stand from elevated mat 2 x 5 B knee ext 5#, 5 reps, much pain reported L knee so discontinued  Manual:  Side lying L for deep tissue massage R gluteals, post SI jt line, lumbar paraspinals, lat hip musculature Supine with bolster under L thigh for gentle AP tibial glides, also distraction L heel combined with quad sets to improve knee extension motion Supine for deep tissue massage L quads, emphasis more on medial distal quads     09/22/23: Manual:  Initially supine with B Le's elevated on wedge, cross friction massage L med and lat jt line, distal quads. Also theragun massage L quads  Side lying L for theragun deep massage B lumbar paraspinals Supine for AP glides L tibia to improve L knee flexion ROM/tolerance  Therex:  Nustep Level 3, x 6 min B knee ext 5# 3 x 10   09/20/23:  Nustep L 4 6 min Reviewed and adapted her initial exercise program,   Practiced sit to stand from hi low table , table elevated, advised pt to try in a firm chair with pillows in the chair to elevate her LTR, pt able to perform with hands under sacrum Bridging attempted, unable due to pain Side lying R for theragun L post hip, glut med, glut max, TFL musculature In sitting utilized large 85 cm physioball for stretching into lumbar flexion, rotation, replicated with st cane as pt has one at home to stretch her L Lower back     09/16/23 EVAL    PATIENT EDUCATION:  Education details: POC, HEP Person educated: Patient Education method: Explanation Education comprehension: verbalized understanding  HOME EXERCISE PROGRAM: Access Code: [facility] URL: [email or web address] Date: 09/16/2023 Prepared by: Almetta Fam  Exercises - Sit to Stand  - 1 x daily - 7 x weekly - 2 sets - 10 reps - Seated Long Arc Quad  - 1 x daily - 7 x weekly - 2 sets - 10 reps - 3 hold - Heel Raises with Counter Support  - 1 x daily - 7 x weekly - 2 sets - 10 reps - Supine Lower Trunk Rotation  - 1 x daily - 7 x weekly - 2 sets - 10 reps - Supine Bridge  - 1 x daily - 7 x weekly - 2 sets - 10 reps  ASSESSMENT:  CLINICAL IMPRESSION:09/29/23:   Continued today with combinations of manual techniques and therapeutic activities to engage lumbar mobility and B LE strength. The pt reports better function LE's.  Still c/o pain L knee and R lower back with any physical activity.   Eval:Patient is a 68 y.o. female who was seen today for physical therapy evaluation and treatment for chronic knee pain. She has pain on both sides but it is mostly the  L knee that bothers her. Patient reports walking increases her pain and she is unable to do steps going up or down with the LLE. She compensates with her RLE when walking and due to this it has started causing pain in her R lower back. She has had shots in the L knee and it seemed to help for a little bit but she still has pain mostly with  any standing and walking activities. Patient will benefit from PT to address her L knee pain as well as the back pain to increase her activity tolerance and improve strength to allow ease with ADLs.  OBJECTIVE IMPAIRMENTS: Abnormal gait, decreased activity tolerance, decreased balance, decreased endurance, difficulty walking, decreased strength, postural dysfunction, and pain.   ACTIVITY LIMITATIONS: squatting, stairs, and locomotion level  PARTICIPATION LIMITATIONS: meal prep, cleaning, laundry, driving, shopping, community activity, and yard work  PERSONAL FACTORS: Age, Fitness, Past/current experiences, and Time since onset of injury/illness/exacerbation are also affecting patient's functional outcome.   REHAB POTENTIAL: Good  CLINICAL DECISION MAKING: Stable/uncomplicated  EVALUATION COMPLEXITY: Low  GOALS: Goals reviewed with patient? Yes  SHORT TERM GOALS: Target date: 10/28/23  Patient will be independent with initial HEP. Baseline:  Goal status: INITIAL  2.  Patient will decrease TUG <14s Baseline: 22s Goal status: INITIAL   LONG TERM GOALS: Target date: 12/09/23  Patient will be independent with advanced/ongoing HEP to improve outcomes and carryover.  Baseline:  Goal status: INITIAL  2.  Patient will report at least 75% improvement in  knee pain to improve QOL. (Pain <4/10) Baseline: 8/10 Goal status: INITIAL  3.  Patient will demonstrate improved functional LE strength as demonstrated by 5xSTS <15s. Baseline: 24s Goal status: INITIAL  4. Patient will be able to ascend/descend stairs with 1 HR and reciprocal step pattern safely to access home and community.  Baseline:  Goal status: INITIAL  5.  Patient will tolerate at least 30 min of standing and walking. Baseline: 5-10 mins Goal status: INITIAL    PLAN:  PT FREQUENCY: 2x/week  PT DURATION: 12 weeks  PLANNED INTERVENTIONS: 97110-Therapeutic exercises, 97530- Therapeutic activity, 97112-  Neuromuscular re-education, 97535- Self Care, 02859- Manual therapy, [phone]- Gait training, [phone]- Vasopneumatic device, [id number]- Ultrasound, [id number]- Ionotophoresis 4mg /ml Dexamethasone , 79439 (1-2 muscles), 20561 (3+ muscles)- Dry Needling, Patient/Family education, Balance training, Stair training, Taping, Joint mobilization, Joint manipulation, Spinal manipulation, Spinal mobilization, Cryotherapy, and Moist heat  PLAN FOR NEXT SESSION: knee strengthening, Functional tasks, low back stretching    Lovena Kluck L Javeon Macmurray, PT, DPT, OCS 09/29/2023, 9:30 AM

## 2023-10-04 ENCOUNTER — Other Ambulatory Visit: Payer: Self-pay

## 2023-10-04 ENCOUNTER — Ambulatory Visit

## 2023-10-04 DIAGNOSIS — M5459 Other low back pain: Secondary | ICD-10-CM

## 2023-10-04 DIAGNOSIS — G8929 Other chronic pain: Secondary | ICD-10-CM

## 2023-10-04 DIAGNOSIS — M25562 Pain in left knee: Secondary | ICD-10-CM | POA: Diagnosis not present

## 2023-10-04 NOTE — Therapy (Signed)
 OUTPATIENT PHYSICAL THERAPY LOWER EXTREMITY TREATMENT   Patient Name: [name] MRN: [id number] DOB:July 17, 1955, 69 y.o., female Today's Date: 10/04/2023  END OF SESSION:  PT End of Session - 10/04/23 0807     Visit Number 6    Date for PT Re-Evaluation 12/09/23    Authorization Type [employer]    Progress Note Due on Visit 10    PT Start Time 0805    PT Stop Time 0850    PT Time Calculation (min) 45 min    Activity Tolerance Patient tolerated treatment well    Behavior During Therapy WFL for tasks assessed/performed               Past Medical History:  Diagnosis Date   Arthritis    Asthma    CHF (congestive heart failure) (HCC)    Coronary artery disease    Diabetes mellitus    Diabetic neuropathy (HCC)    Dysrhythmia    A.fib    takes Eliquis    GERD (gastroesophageal reflux disease)    History of hiatal hernia    History of kidney stones    Hx of cardiovascular stress test    Lexiscan  Myoview (10/15):  Medium area of ischemia in AL and IL distribution, cannot completely exclude shifting breast attenuation, EF 64%; Abnormal study   Hyperlipidemia    Hypertension    Mild carotid artery disease (HCC)    Morbid obesity (HCC)    PAF (paroxysmal atrial fibrillation) (HCC)    Sleep apnea    does not wear CPAP   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   Syncope    Past Surgical History:  Procedure Laterality Date   ACHILLES TENDON REPAIR  1995   right   BIOPSY  07/22/2021   Procedure: BIOPSY;  Surgeon: Saintclair Jasper, MD;  Location: [facility];  Service: Gastroenterology;;   CATARACT EXTRACTION Bilateral    COLONOSCOPY     COLONOSCOPY WITH PROPOFOL  N/A 07/22/2021   Procedure: COLONOSCOPY WITH PROPOFOL ;  Surgeon: Saintclair Jasper, MD;  Location: [facility];  Service: Gastroenterology;  Laterality: N/A;   CYSTOSCOPY W/ RETROGRADES Right 06/15/2023   Procedure: CYSTOSCOPY, WITH RETROGRADE PYELOGRAM;  Surgeon: Shane Steffan BROCKS, MD;  Location: [facility];  Service: Urology;   Laterality: Right;   CYSTOSCOPY/URETEROSCOPY/HOLMIUM LASER/STENT PLACEMENT Right 06/15/2023   Procedure: CYSTOSCOPY/URETEROSCOPY/HOLMIUM LASER/STENT PLACEMENT;  Surgeon: Shane Steffan BROCKS, MD;  Location: [facility];  Service: Urology;  Laterality: Right;   DILATION AND CURETTAGE OF UTERUS     ESOPHAGOGASTRODUODENOSCOPY (EGD) WITH PROPOFOL  N/A 07/22/2021   Procedure: ESOPHAGOGASTRODUODENOSCOPY (EGD) WITH PROPOFOL ;  Surgeon: Saintclair Jasper, MD;  Location: [facility];  Service: Gastroenterology;  Laterality: N/A;   EYE SURGERY Left    detached retina repair   HAND SURGERY Right    right  pinky finger   ORIF   heart stent  2007   PARS PLANA VITRECTOMY Left 03/24/2017   Procedure: PARS PLANA VITRECTOMY WITH 25 GAUGE;  Surgeon: Elner Arley LABOR, MD;  Location: [facility];  Service: Ophthalmology;  Laterality: Left;   POLYPECTOMY  07/22/2021   Procedure: POLYPECTOMY;  Surgeon: Saintclair Jasper, MD;  Location: [facility];  Service: Gastroenterology;;   UTERINE FIBROID SURGERY  2008   Patient Active Problem List   Diagnosis Date Noted   Paroxysmal atrial fibrillation with RVR (HCC) 11/28/2020   Hypertensive urgency 11/28/2020   Syncope and collapse 11/28/2020   Nausea 11/28/2020   Hypokalemia 11/28/2020   Hyperglycemia due to diabetes mellitus (HCC) 11/28/2020   GERD (gastroesophageal reflux disease)  11/28/2020   Asthma 11/28/2020   Coronary atherosclerosis of native coronary artery 07/17/2013   Mixed hyperlipidemia 07/17/2013   Essential hypertension, benign 07/17/2013   Obesity 08/11/2011    PCP: Alberta Sharps  REFERRING PROVIDER: Marcey Her  REFERRING DIAG: Pain in L knee  THERAPY DIAG:  Chronic pain of left knee  Bilateral chronic knee pain  Other low back pain  Rationale for Evaluation and Treatment: Rehabilitation  ONSET DATE: chronic  SUBJECTIVE:   SUBJECTIVE STATEMENT:10/04/23:  lower back is feeling better, I can move better. Still a lot of trouble sleeping   IE:My knees hurt,  the left one more. It is to the point where I can't step up or down on the L side. It has started making the back on the R side hurt now. They put a shot in it in June, it doesn't hurt like it did but I still don't have use of it.   PERTINENT HISTORY: Arthritis, CHF, DM, CAD, PAF, Stroke in 2022  PAIN:  Are you having pain? Yes: NPRS scale: 8/10 Pain location: L knee, R low back  Pain description: ache, dull, can take my breath Aggravating factors: walking, sitting or laying for too long and then trying to get up Relieving factors: nothing really   PRECAUTIONS: None  RED FLAGS: None   WEIGHT BEARING RESTRICTIONS: No  FALLS:  Has patient fallen in last 6 months? No  LIVING ENVIRONMENT: Lives with: lives alone Lives in: House/apartment Stairs: Yes: Internal: steps into the basement steps; on left going up and External: 2 steps; on left going up   OCCUPATION: retired from social services   PLOF: Independent and Independent with basic ADLs  PATIENT GOALS:  to get rid of the pain and be able to walk and move again   NEXT MD VISIT:   OBJECTIVE:  Note: Objective measures were completed at Evaluation unless otherwise noted.  DIAGNOSTIC FINDINGS:   COGNITION: Overall cognitive status: Within functional limits for tasks assessed     SENSATION: WFL   MUSCLE LENGTH: Hamstrings: some tightness in BLE  POSTURE: rounded shoulders  LOWER EXTREMITY ROM: WFL Some pain with hip flexion on both sides in R side LBP Some pain with end range flexion on L knee  LOWER EXTREMITY MMT:  MMT Right eval Left eval  Hip flexion 4- 5  Hip extension    Hip abduction    Hip adduction    Hip internal rotation    Hip external rotation    Knee flexion 4+ 4-  Knee extension 4+ 4+  Ankle dorsiflexion    Ankle plantarflexion    Ankle inversion    Ankle eversion     (Blank rows = not tested)   FUNCTIONAL TESTS:  5 times sit to stand: 24s Timed up and go (TUG):  22s  GAIT: Distance walked: in clinic distances Assistive device utilized: None Level of assistance: Complete Independence and Modified independence Comments: antalgic gait, weight shifting on the RLE, taking quick steps with LLE to avoid using it and weight bearing  TREATMENT DATE:  10/04/23:  Therapeutic activities: Nustep level 5 x 6 min UEs and LEs  Supine with 65 cm ball under thighs, B knee to chest, LTR, bridging Long arc quads 2# cuff wts 10 x 3 each Hamstring curls green t band 3 x 15 Standing on wedge for B plantarflexor stretch in ll bars with B heel raises.  Manual:  Side lying L for theragun massage R SI, glut medius, R lumbar paraspinals Supine for theragun massage L quads, distal medial.  Provided with 1/8 cork lift L shoe due to marked R elevation of iliac crest, to achieve better alignment lower lumbar spine and reduce compression R SI jt  09/29/23:  Therapeutic activities:  Nustep L 5 6 min Ue's and LE's Long arc quads 2# 10 x 3 each Hamstring curls green t band 10 x 3 each Supine with 65 cm ball under thighs, B knee to chest, LTR, bridging Sit to stand from elevated hi/low table 5 x 2  L side lying for theragun massage R post/lat hip and lumbar paraspinals Supine for theragun massage L quads   09/27/23: Therapeutic activities:  attempted gait training with st cane to relieve pressure L knee and R lower back, pt didn't really accommodate to the cane. Nustep L 4 x 6 min Ue's and LE's Supine with 65 cm ball under thighs, B knee to chest, LTR, bridging Sit to stand from elevated mat 2 x 5 B knee ext 5#, 5 reps, much pain reported L knee so discontinued  Manual:  Side lying L for deep tissue massage R gluteals, post SI jt line, lumbar paraspinals, lat hip musculature Supine with bolster under L thigh for gentle AP tibial glides, also  distraction L heel combined with quad sets to improve knee extension motion Supine for deep tissue massage L quads, emphasis more on medial distal quads     09/22/23: Manual:  Initially supine with B Le's elevated on wedge, cross friction massage L med and lat jt line, distal quads. Also theragun massage L quads  Side lying L for theragun deep massage B lumbar paraspinals Supine for AP glides L tibia to improve L knee flexion ROM/tolerance  Therex:  Nustep Level 3, x 6 min B knee ext 5# 3 x 10   09/20/23:  Nustep L 4 6 min Reviewed and adapted her initial exercise program,  Practiced sit to stand from hi low table , table elevated, advised pt to try in a firm chair with pillows in the chair to elevate her LTR, pt able to perform with hands under sacrum Bridging attempted, unable due to pain Side lying R for theragun L post hip, glut med, glut max, TFL musculature In sitting utilized large 85 cm physioball for stretching into lumbar flexion, rotation, replicated with st cane as pt has one at home to stretch her L Lower back     09/16/23 EVAL    PATIENT EDUCATION:  Education details: POC, HEP Person educated: Patient Education method: Explanation Education comprehension: verbalized understanding  HOME EXERCISE PROGRAM: Access Code: [facility] URL: [email or web address] Date: 09/16/2023 Prepared by: Almetta Fam  Exercises - Sit to Stand  - 1 x daily - 7 x weekly - 2 sets - 10 reps - Seated Long Arc Quad  - 1 x daily - 7 x weekly - 2 sets - 10 reps - 3 hold - Heel Raises with Counter Support  - 1 x daily - 7 x weekly - 2 sets - 10 reps - Supine Lower  Trunk Rotation  - 1 x daily - 7 x weekly - 2 sets - 10 reps - Supine Bridge  - 1 x daily - 7 x weekly - 2 sets - 10 reps  ASSESSMENT:  CLINICAL IMPRESSION:10/04/23:   Continued today with manual techniques and therapeutic activities to engage lumbar mobility and B LE strength. Also utilized small lift L shoe to  achieve better lumbar and pelvic alignment .  She is demonstrating guarding, slowed movement with bed mobility and movement transitions.  The pt reports better function LE's.  She is going to contact orthopedist regarding a brace of some kind for L knee. Eval:Patient is a 68 y.o. female who was seen today for physical therapy evaluation and treatment for chronic knee pain. She has pain on both sides but it is mostly the L knee that bothers her. Patient reports walking increases her pain and she is unable to do steps going up or down with the LLE. She compensates with her RLE when walking and due to this it has started causing pain in her R lower back. She has had shots in the L knee and it seemed to help for a little bit but she still has pain mostly with any standing and walking activities. Patient will benefit from PT to address her L knee pain as well as the back pain to increase her activity tolerance and improve strength to allow ease with ADLs.  OBJECTIVE IMPAIRMENTS: Abnormal gait, decreased activity tolerance, decreased balance, decreased endurance, difficulty walking, decreased strength, postural dysfunction, and pain.   ACTIVITY LIMITATIONS: squatting, stairs, and locomotion level  PARTICIPATION LIMITATIONS: meal prep, cleaning, laundry, driving, shopping, community activity, and yard work  PERSONAL FACTORS: Age, Fitness, Past/current experiences, and Time since onset of injury/illness/exacerbation are also affecting patient's functional outcome.   REHAB POTENTIAL: Good  CLINICAL DECISION MAKING: Stable/uncomplicated  EVALUATION COMPLEXITY: Low  GOALS: Goals reviewed with patient? Yes  SHORT TERM GOALS: Target date: 10/28/23  Patient will be independent with initial HEP. Baseline:  Goal status: 10/04/23: progressing  2.  Patient will decrease TUG <14s Baseline: 22s Goal status: INITIAL   LONG TERM GOALS: Target date: 12/09/23  Patient will be independent with advanced/ongoing  HEP to improve outcomes and carryover.  Baseline:  Goal status: INITIAL  2.  Patient will report at least 75% improvement in  knee pain to improve QOL. (Pain <4/10) Baseline: 8/10 Goal status: INITIAL  3.  Patient will demonstrate improved functional LE strength as demonstrated by 5xSTS <15s. Baseline: 24s Goal status: INITIAL  4. Patient will be able to ascend/descend stairs with 1 HR and reciprocal step pattern safely to access home and community.  Baseline:  Goal status: INITIAL  5.  Patient will tolerate at least 30 min of standing and walking. Baseline: 5-10 mins Goal status: INITIAL    PLAN:  PT FREQUENCY: 2x/week  PT DURATION: 12 weeks  PLANNED INTERVENTIONS: 97110-Therapeutic exercises, 97530- Therapeutic activity, [id number]- Neuromuscular re-education, 97535- Self Care, [id number]- Manual therapy, [phone]- Gait training, [phone]- Vasopneumatic device, [id number]- Ultrasound, [id number]- Ionotophoresis 4mg /ml Dexamethasone , 79439 (1-2 muscles), [id number] (3+ muscles)- Dry Needling, Patient/Family education, Balance training, Stair training, Taping, Joint mobilization, Joint manipulation, Spinal manipulation, Spinal mobilization, Cryotherapy, and Moist heat  PLAN FOR NEXT SESSION: knee strengthening, Functional tasks, low back stretching    Pj Zehner L Mannie Wineland, PT, DPT, OCS 10/04/2023, 9:07 AM

## 2023-10-06 ENCOUNTER — Ambulatory Visit

## 2023-10-06 ENCOUNTER — Other Ambulatory Visit: Payer: Self-pay

## 2023-10-06 DIAGNOSIS — M5459 Other low back pain: Secondary | ICD-10-CM

## 2023-10-06 DIAGNOSIS — M25562 Pain in left knee: Secondary | ICD-10-CM | POA: Diagnosis not present

## 2023-10-06 DIAGNOSIS — G8929 Other chronic pain: Secondary | ICD-10-CM

## 2023-10-06 NOTE — Therapy (Signed)
 OUTPATIENT PHYSICAL THERAPY LOWER EXTREMITY TREATMENT   Patient Name: KAYSEE HERGERT MRN: [id number] DOB:[birth year], 68 y.o., female Today's Date: 10/06/2023  END OF SESSION:  PT End of Session - 10/06/23 1307     Visit Number 7    Date for PT Re-Evaluation 12/09/23    Authorization Type [employer]    Progress Note Due on Visit 10    PT Start Time 0805    PT Stop Time 0845    PT Time Calculation (min) 40 min               Past Medical History:  Diagnosis Date   Arthritis    Asthma    CHF (congestive heart failure) (HCC)    Coronary artery disease    Diabetes mellitus    Diabetic neuropathy (HCC)    Dysrhythmia    A.fib    takes Eliquis    GERD (gastroesophageal reflux disease)    History of hiatal hernia    History of kidney stones    Hx of cardiovascular stress test    Lexiscan  Myoview (10/15):  Medium area of ischemia in AL and IL distribution, cannot completely exclude shifting breast attenuation, EF 64%; Abnormal study   Hyperlipidemia    Hypertension    Mild carotid artery disease (HCC)    Morbid obesity (HCC)    PAF (paroxysmal atrial fibrillation) (HCC)    Sleep apnea    does not wear CPAP   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   Syncope    Past Surgical History:  Procedure Laterality Date   ACHILLES TENDON REPAIR  1995   right   BIOPSY  07/22/2021   Procedure: BIOPSY;  Surgeon: Saintclair Jasper, MD;  Location: [facility];  Service: Gastroenterology;;   CATARACT EXTRACTION Bilateral    COLONOSCOPY     COLONOSCOPY WITH PROPOFOL  N/A 07/22/2021   Procedure: COLONOSCOPY WITH PROPOFOL ;  Surgeon: Saintclair Jasper, MD;  Location: [facility];  Service: Gastroenterology;  Laterality: N/A;   CYSTOSCOPY W/ RETROGRADES Right 06/15/2023   Procedure: CYSTOSCOPY, WITH RETROGRADE PYELOGRAM;  Surgeon: Shane Steffan BROCKS, MD;  Location: [facility];  Service: Urology;  Laterality: Right;   CYSTOSCOPY/URETEROSCOPY/HOLMIUM LASER/STENT PLACEMENT Right 06/15/2023   Procedure:  CYSTOSCOPY/URETEROSCOPY/HOLMIUM LASER/STENT PLACEMENT;  Surgeon: Shane Steffan BROCKS, MD;  Location: [facility];  Service: Urology;  Laterality: Right;   DILATION AND CURETTAGE OF UTERUS     ESOPHAGOGASTRODUODENOSCOPY (EGD) WITH PROPOFOL  N/A 07/22/2021   Procedure: ESOPHAGOGASTRODUODENOSCOPY (EGD) WITH PROPOFOL ;  Surgeon: Saintclair Jasper, MD;  Location: [facility];  Service: Gastroenterology;  Laterality: N/A;   EYE SURGERY Left    detached retina repair   HAND SURGERY Right    right  pinky finger   ORIF   heart stent  2007   PARS PLANA VITRECTOMY Left 03/24/2017   Procedure: PARS PLANA VITRECTOMY WITH 25 GAUGE;  Surgeon: Elner Arley LABOR, MD;  Location: [facility];  Service: Ophthalmology;  Laterality: Left;   POLYPECTOMY  07/22/2021   Procedure: POLYPECTOMY;  Surgeon: Saintclair Jasper, MD;  Location: [facility];  Service: Gastroenterology;;   UTERINE FIBROID SURGERY  2008   Patient Active Problem List   Diagnosis Date Noted   Paroxysmal atrial fibrillation with RVR (HCC) 11/28/2020   Hypertensive urgency 11/28/2020   Syncope and collapse 11/28/2020   Nausea 11/28/2020   Hypokalemia 11/28/2020   Hyperglycemia due to diabetes mellitus (HCC) 11/28/2020   GERD (gastroesophageal reflux disease) 11/28/2020   Asthma 11/28/2020   Coronary atherosclerosis of native coronary artery 07/17/2013   Mixed hyperlipidemia 07/17/2013  Essential hypertension, benign 07/17/2013   Obesity 08/11/2011    PCP: Alberta Sharps  REFERRING PROVIDER: Marcey Her  REFERRING DIAG: Pain in L knee  THERAPY DIAG:  Chronic pain of left knee  Bilateral chronic knee pain  Other low back pain  Rationale for Evaluation and Treatment: Rehabilitation  ONSET DATE: chronic  SUBJECTIVE:   SUBJECTIVE STATEMENT:10/06/23:  lower back is feeling better, I think the wedge is helping my back some  I talked to the orthopedist on the phone about maybe getting a brace for my knee   IE:My knees hurt, the left one more. It is to the  point where I can't step up or down on the L side. It has started making the back on the R side hurt now. They put a shot in it in June, it doesn't hurt like it did but I still don't have use of it.   PERTINENT HISTORY: Arthritis, CHF, DM, CAD, PAF, Stroke in 2022  PAIN:  Are you having pain? Yes: NPRS scale: 8/10 Pain location: L knee, R low back  Pain description: ache, dull, can take my breath Aggravating factors: walking, sitting or laying for too long and then trying to get up Relieving factors: nothing really   PRECAUTIONS: None  RED FLAGS: None   WEIGHT BEARING RESTRICTIONS: No  FALLS:  Has patient fallen in last 6 months? No  LIVING ENVIRONMENT: Lives with: lives alone Lives in: House/apartment Stairs: Yes: Internal: steps into the basement steps; on left going up and External: 2 steps; on left going up   OCCUPATION: retired from social services   PLOF: Independent and Independent with basic ADLs  PATIENT GOALS:  to get rid of the pain and be able to walk and move again   NEXT MD VISIT:   OBJECTIVE:  Note: Objective measures were completed at Evaluation unless otherwise noted.  DIAGNOSTIC FINDINGS:   COGNITION: Overall cognitive status: Within functional limits for tasks assessed     SENSATION: WFL   MUSCLE LENGTH: Hamstrings: some tightness in BLE  POSTURE: rounded shoulders  LOWER EXTREMITY ROM: WFL Some pain with hip flexion on both sides in R side LBP Some pain with end range flexion on L knee  LOWER EXTREMITY MMT:  MMT Right eval Left eval  Hip flexion 4- 5  Hip extension    Hip abduction    Hip adduction    Hip internal rotation    Hip external rotation    Knee flexion 4+ 4-  Knee extension 4+ 4+  Ankle dorsiflexion    Ankle plantarflexion    Ankle inversion    Ankle eversion     (Blank rows = not tested)   FUNCTIONAL TESTS:  5 times sit to stand: 24s Timed up and go (TUG): 22s  GAIT: Distance walked: in clinic  distances Assistive device utilized: None Level of assistance: Complete Independence and Modified independence Comments: antalgic gait, weight shifting on the RLE, taking quick steps with LLE to avoid using it and weight bearing  TREATMENT DATE:  10/06/23:  Nustep L 5 x 6 min Standing on wedge for B plantarflexor stretch in ll bars with B heel raises. Standing hip abd in ll bars with 1 1/2 # wt 10x each leg Standing marches 10 x each , 1 1/2 cuff wts Seated for long arc quads 3# 10 x 3  Side lying for deep massage with theragun R post hip and lumbar paraspinals, and in supine for L quads   Supine for physioball B knee to chest,. LTR, bridging  Seated for hamstring curls blue t band 20 x each leg   10/04/23:  Therapeutic activities: Nustep level 5 x 6 min UEs and LEs  Supine with 65 cm ball under thighs, B knee to chest, LTR, bridging Long arc quads 2# cuff wts 10 x 3 each Hamstring curls green t band 3 x 15 Standing on wedge for B plantarflexor stretch in ll bars with B heel raises.  Manual:  Side lying L for theragun massage R SI, glut medius, R lumbar paraspinals Supine for theragun massage L quads, distal medial.  Provided with 1/8 cork lift L shoe due to marked R elevation of iliac crest, to achieve better alignment lower lumbar spine and reduce compression R SI jt  09/29/23:  Therapeutic activities:  Nustep L 5 6 min Ue's and LE's Long arc quads 2# 10 x 3 each Hamstring curls green t band 10 x 3 each Supine with 65 cm ball under thighs, B knee to chest, LTR, bridging Sit to stand from elevated hi/low table 5 x 2  L side lying for theragun massage R post/lat hip and lumbar paraspinals Supine for theragun massage L quads   09/27/23: Therapeutic activities:  attempted gait training with st cane to relieve pressure L knee and R lower back, pt  didn't really accommodate to the cane. Nustep L 4 x 6 min Ue's and LE's Supine with 65 cm ball under thighs, B knee to chest, LTR, bridging Sit to stand from elevated mat 2 x 5 B knee ext 5#, 5 reps, much pain reported L knee so discontinued  Manual:  Side lying L for deep tissue massage R gluteals, post SI jt line, lumbar paraspinals, lat hip musculature Supine with bolster under L thigh for gentle AP tibial glides, also distraction L heel combined with quad sets to improve knee extension motion Supine for deep tissue massage L quads, emphasis more on medial distal quads     09/22/23: Manual:  Initially supine with B Le's elevated on wedge, cross friction massage L med and lat jt line, distal quads. Also theragun massage L quads  Side lying L for theragun deep massage B lumbar paraspinals Supine for AP glides L tibia to improve L knee flexion ROM/tolerance  Therex:  Nustep Level 3, x 6 min B knee ext 5# 3 x 10   09/20/23:  Nustep L 4 6 min Reviewed and adapted her initial exercise program,  Practiced sit to stand from hi low table , table elevated, advised pt to try in a firm chair with pillows in the chair to elevate her LTR, pt able to perform with hands under sacrum Bridging attempted, unable due to pain Side lying R for theragun L post hip, glut med, glut max, TFL musculature In sitting utilized large 85 cm physioball for stretching into lumbar flexion, rotation, replicated with st cane as pt has one at home to stretch her L Lower back     09/16/23 EVAL  PATIENT EDUCATION:  Education details: POC, HEP Person educated: Patient Education method: Explanation Education comprehension: verbalized understanding  HOME EXERCISE PROGRAM: Access Code: [email or web address] URL: [email or web address] Date: 09/16/2023 Prepared by: Almetta Fam  Exercises - Sit to Stand  - 1 x daily - 7 x weekly - 2 sets - 10 reps - Seated Long Arc Quad  - 1 x daily - 7 x weekly - 2 sets - 10  reps - 3 hold - Heel Raises with Counter Support  - 1 x daily - 7 x weekly - 2 sets - 10 reps - Supine Lower Trunk Rotation  - 1 x daily - 7 x weekly - 2 sets - 10 reps - Supine Bridge  - 1 x daily - 7 x weekly - 2 sets - 10 reps  ASSESSMENT:  CLINICAL IMPRESSION:10/06/23:   Continued today with manual techniques and therapeutic activities to engage lumbar mobility and B LE strength.  The pt reports better function LE's.  She did contact orthopedist regarding a brace of some kind for L knee, but is unclear on his recommendation.  Next week she will need a progress report as she will start aquatic PT the following week.   Eval:Patient is a 68 y.o. female who was seen today for physical therapy evaluation and treatment for chronic knee pain. She has pain on both sides but it is mostly the L knee that bothers her. Patient reports walking increases her pain and she is unable to do steps going up or down with the LLE. She compensates with her RLE when walking and due to this it has started causing pain in her R lower back. She has had shots in the L knee and it seemed to help for a little bit but she still has pain mostly with any standing and walking activities. Patient will benefit from PT to address her L knee pain as well as the back pain to increase her activity tolerance and improve strength to allow ease with ADLs.  OBJECTIVE IMPAIRMENTS: Abnormal gait, decreased activity tolerance, decreased balance, decreased endurance, difficulty walking, decreased strength, postural dysfunction, and pain.   ACTIVITY LIMITATIONS: squatting, stairs, and locomotion level  PARTICIPATION LIMITATIONS: meal prep, cleaning, laundry, driving, shopping, community activity, and yard work  PERSONAL FACTORS: Age, Fitness, Past/current experiences, and Time since onset of injury/illness/exacerbation are also affecting patient's functional outcome.   REHAB POTENTIAL: Good  CLINICAL DECISION MAKING:  Stable/uncomplicated  EVALUATION COMPLEXITY: Low  GOALS: Goals reviewed with patient? Yes  SHORT TERM GOALS: Target date: 10/28/23  Patient will be independent with initial HEP. Baseline:  Goal status: 10/04/23: progressing  2.  Patient will decrease TUG <14s Baseline: 22s Goal status: INITIAL   LONG TERM GOALS: Target date: 12/09/23  Patient will be independent with advanced/ongoing HEP to improve outcomes and carryover.  Baseline:  Goal status: INITIAL  2.  Patient will report at least 75% improvement in  knee pain to improve QOL. (Pain <4/10) Baseline: 8/10 Goal status: INITIAL  3.  Patient will demonstrate improved functional LE strength as demonstrated by 5xSTS <15s. Baseline: 24s Goal status: INITIAL  4. Patient will be able to ascend/descend stairs with 1 HR and reciprocal step pattern safely to access home and community.  Baseline:  Goal status: INITIAL  5.  Patient will tolerate at least 30 min of standing and walking. Baseline: 5-10 mins Goal status: 10/06/23: progressing, tolerating 15 -20 min upright activities in clinic    PLAN:  PT FREQUENCY: 2x/week  PT DURATION: 12  weeks  PLANNED INTERVENTIONS: 97110-Therapeutic exercises, 97530- Therapeutic activity, [id number]- Neuromuscular re-education, [phone]- Self Care, [id number]- Manual therapy, [phone]- Gait training, [phone]- Vasopneumatic device, [id number]- Ultrasound, [id number]- Ionotophoresis 4mg /ml Dexamethasone , [id number] (1-2 muscles), [id number] (3+ muscles)- Dry Needling, Patient/Family education, Balance training, Stair training, Taping, Joint mobilization, Joint manipulation, Spinal manipulation, Spinal mobilization, Cryotherapy, and Moist heat  PLAN FOR NEXT SESSION: knee strengthening, Functional tasks, low back stretching , progress assessment   Kerrie Latour L Marian Grandt, PT, DPT, OCS 10/06/2023, 1:09 PM

## 2023-10-09 ENCOUNTER — Other Ambulatory Visit: Payer: Self-pay | Admitting: Interventional Cardiology

## 2023-10-13 ENCOUNTER — Ambulatory Visit

## 2023-10-14 ENCOUNTER — Ambulatory Visit: Attending: Orthopedic Surgery | Admitting: Physical Therapy

## 2023-10-14 ENCOUNTER — Encounter: Payer: Self-pay | Admitting: Physical Therapy

## 2023-10-14 DIAGNOSIS — M25562 Pain in left knee: Secondary | ICD-10-CM | POA: Insufficient documentation

## 2023-10-14 DIAGNOSIS — M5459 Other low back pain: Secondary | ICD-10-CM | POA: Diagnosis present

## 2023-10-14 DIAGNOSIS — G8929 Other chronic pain: Secondary | ICD-10-CM | POA: Diagnosis present

## 2023-10-14 DIAGNOSIS — M25561 Pain in right knee: Secondary | ICD-10-CM | POA: Insufficient documentation

## 2023-10-14 NOTE — Therapy (Addendum)
 OUTPATIENT PHYSICAL THERAPY LOWER EXTREMITY TREATMENT/PROGRESS NOTE   Patient Name: [name] MRN: [id number] DOB:[birth year], 68 y.o., female Today's Date: 10/14/2023  Progress Note Reporting Period 09/16/23 to 10/14/23  See note below for Objective Data and Assessment of Progress/Goals.      END OF SESSION:  PT End of Session - 10/14/23 1535     Visit Number 8    Date for PT Re-Evaluation 12/09/23    Authorization Type [employer]    Progress Note Due on Visit 10    PT Start Time 1519    PT Stop Time 1558    PT Time Calculation (min) 39 min    Activity Tolerance Patient tolerated treatment well    Behavior During Therapy WFL for tasks assessed/performed                Past Medical History:  Diagnosis Date   Arthritis    Asthma    CHF (congestive heart failure) (HCC)    Coronary artery disease    Diabetes mellitus    Diabetic neuropathy (HCC)    Dysrhythmia    A.fib    takes Eliquis    GERD (gastroesophageal reflux disease)    History of hiatal hernia    History of kidney stones    Hx of cardiovascular stress test    Lexiscan  Myoview (10/15):  Medium area of ischemia in AL and IL distribution, cannot completely exclude shifting breast attenuation, EF 64%; Abnormal study   Hyperlipidemia    Hypertension    Mild carotid artery disease (HCC)    Morbid obesity (HCC)    PAF (paroxysmal atrial fibrillation) (HCC)    Sleep apnea    does not wear CPAP   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   Syncope    Past Surgical History:  Procedure Laterality Date   ACHILLES TENDON REPAIR  1995   right   BIOPSY  07/22/2021   Procedure: BIOPSY;  Surgeon: Saintclair Jasper, MD;  Location: [facility];  Service: Gastroenterology;;   CATARACT EXTRACTION Bilateral    COLONOSCOPY     COLONOSCOPY WITH PROPOFOL  N/A 07/22/2021   Procedure: COLONOSCOPY WITH PROPOFOL ;  Surgeon: Saintclair Jasper, MD;  Location: [facility];  Service: Gastroenterology;  Laterality: N/A;   CYSTOSCOPY W/  RETROGRADES Right 06/15/2023   Procedure: CYSTOSCOPY, WITH RETROGRADE PYELOGRAM;  Surgeon: Shane Steffan BROCKS, MD;  Location: [facility];  Service: Urology;  Laterality: Right;   CYSTOSCOPY/URETEROSCOPY/HOLMIUM LASER/STENT PLACEMENT Right 06/15/2023   Procedure: CYSTOSCOPY/URETEROSCOPY/HOLMIUM LASER/STENT PLACEMENT;  Surgeon: Shane Steffan BROCKS, MD;  Location: [facility];  Service: Urology;  Laterality: Right;   DILATION AND CURETTAGE OF UTERUS     ESOPHAGOGASTRODUODENOSCOPY (EGD) WITH PROPOFOL  N/A 07/22/2021   Procedure: ESOPHAGOGASTRODUODENOSCOPY (EGD) WITH PROPOFOL ;  Surgeon: Saintclair Jasper, MD;  Location: [facility];  Service: Gastroenterology;  Laterality: N/A;   EYE SURGERY Left    detached retina repair   HAND SURGERY Right    right  pinky finger   ORIF   heart stent  2007   PARS PLANA VITRECTOMY Left 03/24/2017   Procedure: PARS PLANA VITRECTOMY WITH 25 GAUGE;  Surgeon: Elner Arley LABOR, MD;  Location: [facility];  Service: Ophthalmology;  Laterality: Left;   POLYPECTOMY  07/22/2021   Procedure: POLYPECTOMY;  Surgeon: Saintclair Jasper, MD;  Location: [facility];  Service: Gastroenterology;;   UTERINE FIBROID SURGERY  2008   Patient Active Problem List   Diagnosis Date Noted   Paroxysmal atrial fibrillation with RVR (HCC) 11/28/2020   Hypertensive urgency 11/28/2020   Syncope and  collapse 11/28/2020   Nausea 11/28/2020   Hypokalemia 11/28/2020   Hyperglycemia due to diabetes mellitus (HCC) 11/28/2020   GERD (gastroesophageal reflux disease) 11/28/2020   Asthma 11/28/2020   Coronary atherosclerosis of native coronary artery 07/17/2013   Mixed hyperlipidemia 07/17/2013   Essential hypertension, benign 07/17/2013   Obesity 08/11/2011    PCP: Alberta Sharps  REFERRING PROVIDER: Marcey Her  REFERRING DIAG: Pain in L knee  THERAPY DIAG:  Chronic pain of left knee  Bilateral chronic knee pain  Other low back pain  Rationale for Evaluation and Treatment: Rehabilitation  ONSET DATE:  chronic  SUBJECTIVE:   SUBJECTIVE STATEMENT:  Its a little better, but sometimes I still get a lot of pain at night. Sometimes I still have to use that cane, its annoying because I forget it a lot but its better than falling. Still having to put everything on the right side when I get up.   EVAL: My knees hurt, the left one more. It is to the point where I can't step up or down on the L side. It has started making the back on the R side hurt now. They put a shot in it in June, it doesn't hurt like it did but I still don't have use of it.   PERTINENT HISTORY: Arthritis, CHF, DM, CAD, PAF, Stroke in 2022  PAIN:  Are you having pain? Yes: NPRS scale: 5/10 Pain location: L knee, R low back  Pain description: ache, dull, can take my breath; but has its times where it doesn't hurt and others when it hurts a lot  Aggravating factors: walking, sitting or laying for too long and then trying to get up, depends on position Relieving factors: finding the right position but making sure I still move enough   PRECAUTIONS: None  RED FLAGS: None   WEIGHT BEARING RESTRICTIONS: No  FALLS:  Has patient fallen in last 6 months? No  LIVING ENVIRONMENT: Lives with: lives alone Lives in: House/apartment Stairs: Yes: Internal: steps into the basement steps; on left going up and External: 2 steps; on left going up   OCCUPATION: retired from social services   PLOF: Independent and Independent with basic ADLs  PATIENT GOALS:  to get rid of the pain and be able to walk and move again   NEXT MD VISIT:   OBJECTIVE:  Note: Objective measures were completed at Evaluation unless otherwise noted.  DIAGNOSTIC FINDINGS:   COGNITION: Overall cognitive status: Within functional limits for tasks assessed     SENSATION: WFL   MUSCLE LENGTH: Hamstrings: some tightness in BLE  POSTURE: rounded shoulders  LOWER EXTREMITY ROM: WFL Some pain with hip flexion on both sides in R side LBP Some pain  with end range flexion on L knee  LOWER EXTREMITY MMT:  MMT Right eval Left eval  Hip flexion 4- 5  Hip extension    Hip abduction    Hip adduction    Hip internal rotation    Hip external rotation    Knee flexion 4+ 4-  Knee extension 4+ 4+  Ankle dorsiflexion    Ankle plantarflexion    Ankle inversion    Ankle eversion     (Blank rows = not tested)   FUNCTIONAL TESTS:  5 times sit to stand: 24s; 10/14/23 26.8 seconds no UEs  Timed up and go (TUG): 22s; 10/14/23 17 seconds   GAIT: Distance walked: in clinic distances Assistive device utilized: None Level of assistance: Complete Independence and Modified independence Comments:  antalgic gait, weight shifting on the RLE, taking quick steps with LLE to avoid using it and weight bearing                                                                                                                                 TREATMENT DATE:   10/14/23  5xSTS, TUG, steps, goals Education on progress with PT, recommended fleet feet for custom shoe or orthotic needs, discussed benefit of water  PT and time needed for PT to be effective in order to build mm mass, NMR control, build joint flexibility, etc   Scifit bike x2 min no resistance (Nustep not available), then switched to Nustep when it was available L5x5 min   Forward QL stretch with stool 2x30 seconds  Standing hip ABD x10 B 2# ankle weights  Standing hip hikes 2# x5 B Seated LAQs 4.5# x10 B        10/06/23:  Nustep L 5 x 6 min Standing on wedge for B plantarflexor stretch in ll bars with B heel raises. Standing hip abd in ll bars with 1 1/2 # wt 10x each leg Standing marches 10 x each , 1 1/2 cuff wts Seated for long arc quads 3# 10 x 3  Side lying for deep massage with theragun R post hip and lumbar paraspinals, and in supine for L quads   Supine for physioball B knee to chest,. LTR, bridging  Seated for hamstring curls blue t band 20 x each leg      PATIENT  EDUCATION:  Education details: POC, HEP Person educated: Patient Education method: Explanation Education comprehension: verbalized understanding  HOME EXERCISE PROGRAM: Access Code: [email or web address] URL: [email or web address] Date: 09/16/2023 Prepared by: Almetta Fam  Exercises - Sit to Stand  - 1 x daily - 7 x weekly - 2 sets - 10 reps - Seated Long Arc Quad  - 1 x daily - 7 x weekly - 2 sets - 10 reps - 3 hold - Heel Raises with Counter Support  - 1 x daily - 7 x weekly - 2 sets - 10 reps - Supine Lower Trunk Rotation  - 1 x daily - 7 x weekly - 2 sets - 10 reps - Supine Bridge  - 1 x daily - 7 x weekly - 2 sets - 10 reps  ASSESSMENT:  CLINICAL IMPRESSION:   Checked objectives and goals before she started water  PT as per POC- seems to be making slow but steady progress with PT. She starts water  therapy next visit, will complete six visits with water  PT before returning here for re-assessment on land in October. I think that water  therapy will likely be very helpful for her.     Eval:Patient is a 68 y.o. female who was seen today for physical therapy evaluation and treatment for chronic knee pain. She has pain on both sides but it is mostly the L knee that bothers her. Patient reports walking increases  her pain and she is unable to do steps going up or down with the LLE. She compensates with her RLE when walking and due to this it has started causing pain in her R lower back. She has had shots in the L knee and it seemed to help for a little bit but she still has pain mostly with any standing and walking activities. Patient will benefit from PT to address her L knee pain as well as the back pain to increase her activity tolerance and improve strength to allow ease with ADLs.  OBJECTIVE IMPAIRMENTS: Abnormal gait, decreased activity tolerance, decreased balance, decreased endurance, difficulty walking, decreased strength, postural dysfunction, and pain.   ACTIVITY LIMITATIONS:  squatting, stairs, and locomotion level  PARTICIPATION LIMITATIONS: meal prep, cleaning, laundry, driving, shopping, community activity, and yard work  PERSONAL FACTORS: Age, Fitness, Past/current experiences, and Time since onset of injury/illness/exacerbation are also affecting patient's functional outcome.   REHAB POTENTIAL: Good  CLINICAL DECISION MAKING: Stable/uncomplicated  EVALUATION COMPLEXITY: Low  GOALS: Goals reviewed with patient? Yes  SHORT TERM GOALS: Target date: 10/28/23  Patient will be independent with initial HEP. Baseline:  Goal status: MET 10/14/23  2.  Patient will decrease TUG <14s Baseline: 22s Goal status: IN PROGRESS 10/14/23   LONG TERM GOALS: Target date: 12/09/23  Patient will be independent with advanced/ongoing HEP to improve outcomes and carryover.  Baseline:  Goal status: IN PROGRESS 10/14/23  2.  Patient will report at least 75% improvement in  knee pain to improve QOL. (Pain <4/10) Baseline: 8/10 Goal status: IN PROGRESS 10/14/23  3.  Patient will demonstrate improved functional LE strength as demonstrated by 5xSTS <15s. Baseline: 24s Goal status: IN PROGRESS 10/14/23  4. Patient will be able to ascend/descend stairs with 1 HR and reciprocal step pattern safely to access home and community.  Baseline:  Goal status: IN PROGRESS 10/14/23  5.  Patient will tolerate at least 30 min of standing and walking. Baseline: 5-10 mins Goal status: ONGOING 10/14/23    PLAN:  PT FREQUENCY: 2x/week  PT DURATION: 12 weeks  PLANNED INTERVENTIONS: 97110-Therapeutic exercises, 97530- Therapeutic activity, 97112- Neuromuscular re-education, 97535- Self Care, [id number]- Manual therapy, [phone]- Gait training, [phone]- Vasopneumatic device, [id number]- Ultrasound, [id number]- Ionotophoresis 4mg /ml Dexamethasone , 79439 (1-2 muscles), [id number] (3+ muscles)- Dry Needling, Patient/Family education, Balance training, Stair training, Taping, Joint mobilization, Joint manipulation, Spinal  manipulation, Spinal mobilization, Cryotherapy, and Moist heat  PLAN FOR NEXT SESSION:   Land: progressive PREs, lumbar and hip mobility/flexibility, core; re-assess when she returns to land   Water : per aquatic PT   Josette Rough, PT, DPT 10/14/23 4:06 PM  Addendum 10/20/23- changed note to progress note prior to starting water  PT. [place]

## 2023-10-18 ENCOUNTER — Telehealth (HOSPITAL_BASED_OUTPATIENT_CLINIC_OR_DEPARTMENT_OTHER): Payer: Self-pay | Admitting: Cardiology

## 2023-10-18 NOTE — Telephone Encounter (Signed)
 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 Is there anywhere we can get her in sooner to her 6 mo f/u time?

## 2023-10-18 NOTE — Telephone Encounter (Signed)
 Was able to find a slot on Dr. Eston schedule for 9/17 at 1140, that scheduling could offer.  Endorsed this slot to our scheduling team to call and offer this to the pt.  Scheduling will reach out to the pt shortly to offer this appointment slot, and will keep us  posted if this date/time doesn't work for her.

## 2023-10-18 NOTE — Telephone Encounter (Signed)
 Pt is scheduled to see Dr. Lonni for 10/28/23 at 1140.  Pt made aware of appt date and time by Scheduling dept.

## 2023-10-20 ENCOUNTER — Encounter (HOSPITAL_BASED_OUTPATIENT_CLINIC_OR_DEPARTMENT_OTHER): Payer: Self-pay | Admitting: Physical Therapy

## 2023-10-20 ENCOUNTER — Ambulatory Visit (HOSPITAL_BASED_OUTPATIENT_CLINIC_OR_DEPARTMENT_OTHER): Attending: Orthopedic Surgery | Admitting: Physical Therapy

## 2023-10-20 DIAGNOSIS — M25561 Pain in right knee: Secondary | ICD-10-CM | POA: Diagnosis present

## 2023-10-20 DIAGNOSIS — M25562 Pain in left knee: Secondary | ICD-10-CM | POA: Diagnosis present

## 2023-10-20 DIAGNOSIS — G8929 Other chronic pain: Secondary | ICD-10-CM | POA: Diagnosis present

## 2023-10-20 DIAGNOSIS — M5459 Other low back pain: Secondary | ICD-10-CM | POA: Insufficient documentation

## 2023-10-20 NOTE — Therapy (Signed)
 OUTPATIENT PHYSICAL THERAPY LOWER EXTREMITY TREATMENT   Patient Name: [name] MRN: [id number] DOB:[birth year], 68 y.o., female Today's Date: 10/20/2023  END OF SESSION:  PT End of Session - 10/20/23 0941     Visit Number 9    Date for PT Re-Evaluation 12/09/23    Authorization Type [employer]    Progress Note Due on Visit 10    PT Start Time 0935    PT Stop Time 1013    PT Time Calculation (min) 38 min    Activity Tolerance Patient tolerated treatment well    Behavior During Therapy WFL for tasks assessed/performed                Past Medical History:  Diagnosis Date   Arthritis    Asthma    CHF (congestive heart failure) (HCC)    Coronary artery disease    Diabetes mellitus    Diabetic neuropathy (HCC)    Dysrhythmia    A.fib    takes Eliquis    GERD (gastroesophageal reflux disease)    History of hiatal hernia    History of kidney stones    Hx of cardiovascular stress test    Lexiscan  Myoview (10/15):  Medium area of ischemia in AL and IL distribution, cannot completely exclude shifting breast attenuation, EF 64%; Abnormal study   Hyperlipidemia    Hypertension    Mild carotid artery disease (HCC)    Morbid obesity (HCC)    PAF (paroxysmal atrial fibrillation) (HCC)    Sleep apnea    does not wear CPAP   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   Syncope    Past Surgical History:  Procedure Laterality Date   ACHILLES TENDON REPAIR  1995   right   BIOPSY  07/22/2021   Procedure: BIOPSY;  Surgeon: Saintclair Jasper, MD;  Location: [facility];  Service: Gastroenterology;;   CATARACT EXTRACTION Bilateral    COLONOSCOPY     COLONOSCOPY WITH PROPOFOL  N/A 07/22/2021   Procedure: COLONOSCOPY WITH PROPOFOL ;  Surgeon: Saintclair Jasper, MD;  Location: [facility];  Service: Gastroenterology;  Laterality: N/A;   CYSTOSCOPY W/ RETROGRADES Right 06/15/2023   Procedure: CYSTOSCOPY, WITH RETROGRADE PYELOGRAM;  Surgeon: Shane Steffan BROCKS, MD;  Location: [facility];  Service: Urology;   Laterality: Right;   CYSTOSCOPY/URETEROSCOPY/HOLMIUM LASER/STENT PLACEMENT Right 06/15/2023   Procedure: CYSTOSCOPY/URETEROSCOPY/HOLMIUM LASER/STENT PLACEMENT;  Surgeon: Shane Steffan BROCKS, MD;  Location: [facility];  Service: Urology;  Laterality: Right;   DILATION AND CURETTAGE OF UTERUS     ESOPHAGOGASTRODUODENOSCOPY (EGD) WITH PROPOFOL  N/A 07/22/2021   Procedure: ESOPHAGOGASTRODUODENOSCOPY (EGD) WITH PROPOFOL ;  Surgeon: Saintclair Jasper, MD;  Location: [facility];  Service: Gastroenterology;  Laterality: N/A;   EYE SURGERY Left    detached retina repair   HAND SURGERY Right    right  pinky finger   ORIF   heart stent  2007   PARS PLANA VITRECTOMY Left 03/24/2017   Procedure: PARS PLANA VITRECTOMY WITH 25 GAUGE;  Surgeon: Elner Arley LABOR, MD;  Location: [facility] OR;  Service: Ophthalmology;  Laterality: Left;   POLYPECTOMY  07/22/2021   Procedure: POLYPECTOMY;  Surgeon: Saintclair Jasper, MD;  Location: [facility];  Service: Gastroenterology;;   UTERINE FIBROID SURGERY  2008   Patient Active Problem List   Diagnosis Date Noted   Paroxysmal atrial fibrillation with RVR (HCC) 11/28/2020   Hypertensive urgency 11/28/2020   Syncope and collapse 11/28/2020   Nausea 11/28/2020   Hypokalemia 11/28/2020   Hyperglycemia due to diabetes mellitus (HCC) 11/28/2020   GERD (gastroesophageal reflux  disease) 11/28/2020   Asthma 11/28/2020   Coronary atherosclerosis of native coronary artery 07/17/2013   Mixed hyperlipidemia 07/17/2013   Essential hypertension, benign 07/17/2013   Obesity 08/11/2011    PCP: Alberta Sharps  REFERRING PROVIDER: Marcey Her  REFERRING DIAG: Pain in L knee  THERAPY DIAG:  Chronic pain of left knee  Bilateral chronic knee pain  Other low back pain  Rationale for Evaluation and Treatment: Rehabilitation  ONSET DATE: chronic  SUBJECTIVE:   SUBJECTIVE STATEMENT: I am afraid of the water , I don't swim.  Knee pain 5/10 Left; LB 5/10    EVAL: My knees hurt, the left  one more. It is to the point where I can't step up or down on the L side. It has started making the back on the R side hurt now. They put a shot in it in June, it doesn't hurt like it did but I still don't have use of it.   PERTINENT HISTORY: Arthritis, CHF, DM, CAD, PAF, Stroke in 2022  PAIN:  Are you having pain? Yes: NPRS scale: 5/10 Pain location: L knee, R low back  Pain description: ache, dull, can take my breath; but has its times where it doesn't hurt and others when it hurts a lot  Aggravating factors: walking, sitting or laying for too long and then trying to get up, depends on position Relieving factors: finding the right position but making sure I still move enough   PRECAUTIONS: None  RED FLAGS: None   WEIGHT BEARING RESTRICTIONS: No  FALLS:  Has patient fallen in last 6 months? No  LIVING ENVIRONMENT: Lives with: lives alone Lives in: House/apartment Stairs: Yes: Internal: steps into the basement steps; on left going up and External: 2 steps; on left going up   OCCUPATION: retired from social services   PLOF: Independent and Independent with basic ADLs  PATIENT GOALS:  to get rid of the pain and be able to walk and move again   NEXT MD VISIT:   OBJECTIVE:  Note: Objective measures were completed at Evaluation unless otherwise noted.  DIAGNOSTIC FINDINGS:   COGNITION: Overall cognitive status: Within functional limits for tasks assessed     SENSATION: WFL   MUSCLE LENGTH: Hamstrings: some tightness in BLE  POSTURE: rounded shoulders  LOWER EXTREMITY ROM: WFL Some pain with hip flexion on both sides in R side LBP Some pain with end range flexion on L knee  LOWER EXTREMITY MMT:  MMT Right eval Left eval  Hip flexion 4- 5  Hip extension    Hip abduction    Hip adduction    Hip internal rotation    Hip external rotation    Knee flexion 4+ 4-  Knee extension 4+ 4+  Ankle dorsiflexion    Ankle plantarflexion    Ankle inversion    Ankle  eversion     (Blank rows = not tested)   FUNCTIONAL TESTS:  5 times sit to stand: 24s; 10/14/23 26.8 seconds no UEs  Timed up and go (TUG): 22s; 10/14/23 17 seconds   GAIT: Distance walked: in clinic distances Assistive device utilized: None Level of assistance: Complete Independence and Modified independence Comments: antalgic gait, weight shifting on the RLE, taking quick steps with LLE to avoid using it and weight bearing  TREATMENT DATE:  [facility] Adult PT Treatment:                                                DATE: 10/20/23 Pt seen for aquatic therapy today.  Treatment took place in water  3.5-4.75 ft in depth at the [facility] pool. Temp of water  was 91.  Pt entered/exited the pool via lift (for safety Pt afraid of water ) with hand rail.  *Intro to setting *seated on lift: LAQ; cycling; hip add/abd;  *Ue support on wall: toe raises; heel raises; hip add/abd; high knee marching *Side stepping ue support on wall *forward amb ue support barbell.  VC for confidence then knee flex for heel strike. 1/2way across pool x 4 then full width *L stretch *Side stepping x 2 widths ue support barbell.  Cues for knee flex and relaxed posture *multiple rest periods throughout session leaning on wall  Pt requires the buoyancy and hydrostatic pressure of water  for support, and to offload joints by unweighting joint load by at least 50 % in navel deep water  and by at least 75-80% in chest to neck deep water .  Viscosity of the water  is needed for resistance of strengthening. Water  current perturbations provides challenge to standing balance requiring increased core activation.      10/14/23  5xSTS, TUG, steps, goals Education on progress with PT, recommended fleet feet for custom shoe or orthotic needs, discussed benefit of water  PT and time needed for PT to be  effective in order to build mm mass, NMR control, build joint flexibility, etc   Scifit bike x2 min no resistance (Nustep not available), then switched to Nustep when it was available L5x5 min   Forward QL stretch with stool 2x30 seconds  Standing hip ABD x10 B 2# ankle weights  Standing hip hikes 2# x5 B Seated LAQs 4.5# x10 B        10/06/23:  Nustep L 5 x 6 min Standing on wedge for B plantarflexor stretch in ll bars with B heel raises. Standing hip abd in ll bars with 1 1/2 # wt 10x each leg Standing marches 10 x each , 1 1/2 cuff wts Seated for long arc quads 3# 10 x 3  Side lying for deep massage with theragun R post hip and lumbar paraspinals, and in supine for L quads   Supine for physioball B knee to chest,. LTR, bridging  Seated for hamstring curls blue t band 20 x each leg      PATIENT EDUCATION:  Education details: POC, HEP Person educated: Patient Education method: Explanation Education comprehension: verbalized understanding  HOME EXERCISE PROGRAM: Access Code: [email or web address] URL: [email or web address] Date: 09/16/2023 Prepared by: Almetta Fam  Exercises - Sit to Stand  - 1 x daily - 7 x weekly - 2 sets - 10 reps - Seated Long Arc Quad  - 1 x daily - 7 x weekly - 2 sets - 10 reps - 3 hold - Heel Raises with Counter Support  - 1 x daily - 7 x weekly - 2 sets - 10 reps - Supine Lower Trunk Rotation  - 1 x daily - 7 x weekly - 2 sets - 10 reps - Supine Bridge  - 1 x daily - 7 x weekly - 2 sets - 10 reps  ASSESSMENT:  CLINICAL IMPRESSION: Pt demonstrates safety and independence in aquatic  setting with therapist instructing from deck. She verbalizes apprehensiveness with submersion but is willing to try. Apprehension reduces as session progresses and pt able to walk across pool with minor unsteadiness but no LOB. Pt is directed through various movement patterns and trials in both sitting and standing positions. Cuing provided for relaxation of  guarded posture and improved gait pattern.  She reports a reduction in pain post session in both knees and LB.  Goals are ongoing.        Eval:Patient is a 68 y.o. female who was seen today for physical therapy evaluation and treatment for chronic knee pain. She has pain on both sides but it is mostly the L knee that bothers her. Patient reports walking increases her pain and she is unable to do steps going up or down with the LLE. She compensates with her RLE when walking and due to this it has started causing pain in her R lower back. She has had shots in the L knee and it seemed to help for a little bit but she still has pain mostly with any standing and walking activities. Patient will benefit from PT to address her L knee pain as well as the back pain to increase her activity tolerance and improve strength to allow ease with ADLs.  OBJECTIVE IMPAIRMENTS: Abnormal gait, decreased activity tolerance, decreased balance, decreased endurance, difficulty walking, decreased strength, postural dysfunction, and pain.   ACTIVITY LIMITATIONS: squatting, stairs, and locomotion level  PARTICIPATION LIMITATIONS: meal prep, cleaning, laundry, driving, shopping, community activity, and yard work  PERSONAL FACTORS: Age, Fitness, Past/current experiences, and Time since onset of injury/illness/exacerbation are also affecting patient's functional outcome.   REHAB POTENTIAL: Good  CLINICAL DECISION MAKING: Stable/uncomplicated  EVALUATION COMPLEXITY: Low  GOALS: Goals reviewed with patient? Yes  SHORT TERM GOALS: Target date: 10/28/23  Patient will be independent with initial HEP. Baseline:  Goal status: MET 10/14/23  2.  Patient will decrease TUG <14s Baseline: 22s Goal status: IN PROGRESS 10/14/23   LONG TERM GOALS: Target date: 12/09/23  Patient will be independent with advanced/ongoing HEP to improve outcomes and carryover.  Baseline:  Goal status: IN PROGRESS 10/14/23  2.  Patient will  report at least 75% improvement in  knee pain to improve QOL. (Pain <4/10) Baseline: 8/10 Goal status: IN PROGRESS 10/14/23  3.  Patient will demonstrate improved functional LE strength as demonstrated by 5xSTS <15s. Baseline: 24s Goal status: IN PROGRESS 10/14/23  4. Patient will be able to ascend/descend stairs with 1 HR and reciprocal step pattern safely to access home and community.  Baseline:  Goal status: IN PROGRESS 10/14/23  5.  Patient will tolerate at least 30 min of standing and walking. Baseline: 5-10 mins Goal status: ONGOING 10/14/23    PLAN:  PT FREQUENCY: 2x/week  PT DURATION: 12 weeks  PLANNED INTERVENTIONS: 97110-Therapeutic exercises, 97530- Therapeutic activity, 97112- Neuromuscular re-education, 97535- Self Care, 02859- Manual therapy, [phone]- Gait training, [phone]- Vasopneumatic device, [id number]- Ultrasound, [id number]- Ionotophoresis 4mg /ml Dexamethasone , 79439 (1-2 muscles), [id number] (3+ muscles)- Dry Needling, Patient/Family education, Balance training, Stair training, Taping, Joint mobilization, Joint manipulation, Spinal manipulation, Spinal mobilization, Cryotherapy, and Moist heat  PLAN FOR NEXT SESSION:   Land: progressive PREs, lumbar and hip mobility/flexibility, core; re-assess when she returns to land   Water : per aquatic PT   Ronal Kem) Noelly Lasseigne MPT 10/20/23 9:43 AM [facility] [address] [place], [place], [address] Phone: [phone]   Fax:  [phone]

## 2023-10-25 ENCOUNTER — Ambulatory Visit (HOSPITAL_BASED_OUTPATIENT_CLINIC_OR_DEPARTMENT_OTHER): Admitting: Physical Therapy

## 2023-10-25 ENCOUNTER — Encounter (HOSPITAL_BASED_OUTPATIENT_CLINIC_OR_DEPARTMENT_OTHER): Payer: Self-pay | Admitting: Physical Therapy

## 2023-10-25 DIAGNOSIS — G8929 Other chronic pain: Secondary | ICD-10-CM

## 2023-10-25 DIAGNOSIS — M5459 Other low back pain: Secondary | ICD-10-CM

## 2023-10-25 DIAGNOSIS — M25562 Pain in left knee: Secondary | ICD-10-CM | POA: Diagnosis not present

## 2023-10-25 NOTE — Therapy (Signed)
 OUTPATIENT PHYSICAL THERAPY LOWER EXTREMITY TREATMENT   Patient Name: [name] MRN: [id number] DOB:[birth year], 68 y.o., female Today's Date: 10/25/2023  END OF SESSION:  PT End of Session - 10/25/23 0844     Visit Number 10    Date for PT Re-Evaluation 12/09/23    Authorization Type U[employer]    Progress Note Due on Visit 19    PT Start Time 0845    PT Stop Time 0925    PT Time Calculation (min) 40 min    Activity Tolerance Patient tolerated treatment well    Behavior During Therapy WFL for tasks assessed/performed                Past Medical History:  Diagnosis Date   Arthritis    Asthma    CHF (congestive heart failure) (HCC)    Coronary artery disease    Diabetes mellitus    Diabetic neuropathy (HCC)    Dysrhythmia    A.fib    takes Eliquis    GERD (gastroesophageal reflux disease)    History of hiatal hernia    History of kidney stones    Hx of cardiovascular stress test    Lexiscan  Myoview (10/15):  Medium area of ischemia in AL and IL distribution, cannot completely exclude shifting breast attenuation, EF 64%; Abnormal study   Hyperlipidemia    Hypertension    Mild carotid artery disease (HCC)    Morbid obesity (HCC)    PAF (paroxysmal atrial fibrillation) (HCC)    Sleep apnea    does not wear CPAP   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   Syncope    Past Surgical History:  Procedure Laterality Date   ACHILLES TENDON REPAIR  1995   right   BIOPSY  07/22/2021   Procedure: BIOPSY;  Surgeon: Saintclair Jasper, MD;  Location: [facility];  Service: Gastroenterology;;   CATARACT EXTRACTION Bilateral    COLONOSCOPY     COLONOSCOPY WITH PROPOFOL  N/A 07/22/2021   Procedure: COLONOSCOPY WITH PROPOFOL ;  Surgeon: Saintclair Jasper, MD;  Location: [facility];  Service: Gastroenterology;  Laterality: N/A;   CYSTOSCOPY W/ RETROGRADES Right 06/15/2023   Procedure: CYSTOSCOPY, WITH RETROGRADE PYELOGRAM;  Surgeon: Shane Steffan BROCKS, MD;  Location: [facility];  Service: Urology;   Laterality: Right;   CYSTOSCOPY/URETEROSCOPY/HOLMIUM LASER/STENT PLACEMENT Right 06/15/2023   Procedure: CYSTOSCOPY/URETEROSCOPY/HOLMIUM LASER/STENT PLACEMENT;  Surgeon: Shane Steffan BROCKS, MD;  Location: [facility];  Service: Urology;  Laterality: Right;   DILATION AND CURETTAGE OF UTERUS     ESOPHAGOGASTRODUODENOSCOPY (EGD) WITH PROPOFOL  N/A 07/22/2021   Procedure: ESOPHAGOGASTRODUODENOSCOPY (EGD) WITH PROPOFOL ;  Surgeon: Saintclair Jasper, MD;  [facility];  Service: Gastroenterology;  Laterality: N/A;   EYE SURGERY Left    detached retina repair   HAND SURGERY Right    right  pinky finger   ORIF   heart stent  2007   PARS PLANA VITRECTOMY Left 03/24/2017   Procedure: PARS PLANA VITRECTOMY WITH 25 GAUGE;  Surgeon: Elner Arley LABOR, MD;  Location: [facility] OR;  Service: Ophthalmology;  Laterality: Left;   POLYPECTOMY  07/22/2021   Procedure: POLYPECTOMY;  Surgeon: Saintclair Jasper, MD;  [facility]: [facility];  Service: Gastroenterology;;   UTERINE FIBROID SURGERY  2008   Patient Active Problem List   Diagnosis Date Noted   Paroxysmal atrial fibrillation with RVR (HCC) 11/28/2020   Hypertensive urgency 11/28/2020   Syncope and collapse 11/28/2020   Nausea 11/28/2020   Hypokalemia 11/28/2020   Hyperglycemia due to diabetes mellitus (HCC) 11/28/2020   GERD (gastroesophageal reflux  disease) 11/28/2020   Asthma 11/28/2020   Coronary atherosclerosis of native coronary artery 07/17/2013   Mixed hyperlipidemia 07/17/2013   Essential hypertension, benign 07/17/2013   Obesity 08/11/2011    PCP: Alberta Sharps  REFERRING PROVIDER: Marcey Her  REFERRING DIAG: Pain in L knee  THERAPY DIAG:  Chronic pain of left knee  Bilateral chronic knee pain  Other low back pain  Rationale for Evaluation and Treatment: Rehabilitation  ONSET DATE: chronic  SUBJECTIVE:   SUBJECTIVE STATEMENT: I was a little sore after last session but was in my muscles.  Knee pain 4/10 Left; LB 5/10    EVAL: My  knees hurt, the left one more. It is to the point where I can't step up or down on the L side. It has started making the back on the R side hurt now. They put a shot in it in June, it doesn't hurt like it did but I still don't have use of it.   PERTINENT HISTORY: Arthritis, CHF, DM, CAD, PAF, Stroke in 2022  PAIN:  Are you having pain? Yes: NPRS scale: 5/10 Pain location: L knee, R low back  Pain description: ache, dull, can take my breath; but has its times where it doesn't hurt and others when it hurts a lot  Aggravating factors: walking, sitting or laying for too long and then trying to get up, depends on position Relieving factors: finding the right position but making sure I still move enough   PRECAUTIONS: None  RED FLAGS: None   WEIGHT BEARING RESTRICTIONS: No  FALLS:  Has patient fallen in last 6 months? No  LIVING ENVIRONMENT: Lives with: lives alone Lives in: House/apartment Stairs: Yes: Internal: steps into the basement steps; on left going up and External: 2 steps; on left going up   OCCUPATION: retired from social services   PLOF: Independent and Independent with basic ADLs  PATIENT GOALS:  to get rid of the pain and be able to walk and move again   NEXT MD VISIT:   OBJECTIVE:  Note: Objective measures were completed at Evaluation unless otherwise noted.  DIAGNOSTIC FINDINGS:   COGNITION: Overall cognitive status: Within functional limits for tasks assessed     SENSATION: WFL   MUSCLE LENGTH: Hamstrings: some tightness in BLE  POSTURE: rounded shoulders  LOWER EXTREMITY ROM: WFL Some pain with hip flexion on both sides in R side LBP Some pain with end range flexion on L knee  LOWER EXTREMITY MMT:  MMT Right eval Left eval  Hip flexion 4- 5  Hip extension    Hip abduction    Hip adduction    Hip internal rotation    Hip external rotation    Knee flexion 4+ 4-  Knee extension 4+ 4+  Ankle dorsiflexion    Ankle plantarflexion    Ankle  inversion    Ankle eversion     (Blank rows = not tested)   FUNCTIONAL TESTS:  5 times sit to stand: 24s; 10/14/23 26.8 seconds no UEs  Timed up and go (TUG): 22s; 10/14/23 17 seconds   GAIT: Distance walked: in clinic distances Assistive device utilized: None Level of assistance: Complete Independence and Modified independence Comments: antalgic gait, weight shifting on the RLE, taking quick steps with LLE to avoid using it and weight bearing  TREATMENT DATE:  [facility] Adult PT Treatment:                                                DATE: 10/25/23 Pt seen for aquatic therapy today.  Treatment took place in water  3.5-4.75 ft in depth at the [facility] pool. Temp of water  was 91.  Pt entered/exited the pool via lift (for safety Pt afraid of water ) with hand rail.  *stair negotiation with vc using step to pattern  *walking forward x 4 widths UE support barbell *seated on lift: LAQ; cycling; hip add/abd;  *Ue support on wall: toe raises; heel raises; hip add/abd; high knee marching; HS curl *Side stepping ue support on barbell *step up leading R x 5 *L stretch *step up leading L x 3 *stair negotiation out. VC for proper step to pattern *multiple rest periods throughout session leaning on wall  Pt requires the buoyancy and hydrostatic pressure of water  for support, and to offload joints by unweighting joint load by at least 50 % in navel deep water  and by at least 75-80% in chest to neck deep water .  Viscosity of the water  is needed for resistance of strengthening. Water  current perturbations provides challenge to standing balance requiring increased core activation.         PATIENT EDUCATION:  Education details: POC, HEP Person educated: Patient Education method: Explanation Education comprehension: verbalized understanding  HOME EXERCISE  PROGRAM: Access Code: [email or web address] URL: [email or web address] Date: 09/16/2023 Prepared by: Almetta Fam  Exercises - Sit to Stand  - 1 x daily - 7 x weekly - 2 sets - 10 reps - Seated Long Arc Quad  - 1 x daily - 7 x weekly - 2 sets - 10 reps - 3 hold - Heel Raises with Counter Support  - 1 x daily - 7 x weekly - 2 sets - 10 reps - Supine Lower Trunk Rotation  - 1 x daily - 7 x weekly - 2 sets - 10 reps - Supine Bridge  - 1 x daily - 7 x weekly - 2 sets - 10 reps  ASSESSMENT:  CLINICAL IMPRESSION: Good response to initial aquatic session. She demonstrates improvement in dynamic standing balance walking submerged in 3.6 ft. Able to complete side stepping with reduced ue support from wall to HB.  No LOB.  She does reports mid Lbp with step ups as well as left knee pain which resolves once discontinued.  Cuing for proper step to pattern out of pool completed well with reduced UE support. Goals ongoing       Eval:Patient is a 68 y.o. female who was seen today for physical therapy evaluation and treatment for chronic knee pain. She has pain on both sides but it is mostly the L knee that bothers her. Patient reports walking increases her pain and she is unable to do steps going up or down with the LLE. She compensates with her RLE when walking and due to this it has started causing pain in her R lower back. She has had shots in the L knee and it seemed to help for a little bit but she still has pain mostly with any standing and walking activities. Patient will benefit from PT to address her L knee pain as well as the back pain to increase her activity tolerance and improve strength to allow ease with  ADLs.  OBJECTIVE IMPAIRMENTS: Abnormal gait, decreased activity tolerance, decreased balance, decreased endurance, difficulty walking, decreased strength, postural dysfunction, and pain.   ACTIVITY LIMITATIONS: squatting, stairs, and locomotion level  PARTICIPATION LIMITATIONS: meal  prep, cleaning, laundry, driving, shopping, community activity, and yard work  PERSONAL FACTORS: Age, Fitness, Past/current experiences, and Time since onset of injury/illness/exacerbation are also affecting patient's functional outcome.   REHAB POTENTIAL: Good  CLINICAL DECISION MAKING: Stable/uncomplicated  EVALUATION COMPLEXITY: Low  GOALS: Goals reviewed with patient? Yes  SHORT TERM GOALS: Target date: 10/28/23  Patient will be independent with initial HEP. Baseline:  Goal status: MET 10/14/23  2.  Patient will decrease TUG <14s Baseline: 22s Goal status: IN PROGRESS 10/14/23   LONG TERM GOALS: Target date: 12/09/23  Patient will be independent with advanced/ongoing HEP to improve outcomes and carryover.  Baseline:  Goal status: IN PROGRESS 10/14/23  2.  Patient will report at least 75% improvement in  knee pain to improve QOL. (Pain <4/10) Baseline: 8/10 Goal status: IN PROGRESS 10/14/23  3.  Patient will demonstrate improved functional LE strength as demonstrated by 5xSTS <15s. Baseline: 24s Goal status: IN PROGRESS 10/14/23  4. Patient will be able to ascend/descend stairs with 1 HR and reciprocal step pattern safely to access home and community.  Baseline:  Goal status: IN PROGRESS 10/14/23  5.  Patient will tolerate at least 30 min of standing and walking. Baseline: 5-10 mins Goal status: ONGOING 10/14/23    PLAN:  PT FREQUENCY: 2x/week  PT DURATION: 12 weeks  PLANNED INTERVENTIONS: 97110-Therapeutic exercises, 97530- Therapeutic activity, 97112- Neuromuscular re-education, 97535- Self Care, 02859- Manual therapy, [phone]- Gait training, [phone]- Vasopneumatic device, [id number]- Ultrasound, [id number]- Ionotophoresis 4mg /ml Dexamethasone , 79439 (1-2 muscles), [id number] (3+ muscles)- Dry Needling, Patient/Family education, Balance training, Stair training, Taping, Joint mobilization, Joint manipulation, Spinal manipulation, Spinal mobilization, Cryotherapy, and Moist heat  PLAN FOR  NEXT SESSION:   Land: progressive PREs, lumbar and hip mobility/flexibility, core; re-assess when she returns to land   Water : per aquatic PT   Ronal Kem) [place] MPT 10/25/23 8:54 AM [facility] [address] [place], [place], [address] Phone: [phone]   Fax:  [phone]

## 2023-10-27 ENCOUNTER — Encounter (HOSPITAL_BASED_OUTPATIENT_CLINIC_OR_DEPARTMENT_OTHER): Payer: Self-pay | Admitting: Physical Therapy

## 2023-10-27 ENCOUNTER — Ambulatory Visit (HOSPITAL_BASED_OUTPATIENT_CLINIC_OR_DEPARTMENT_OTHER): Admitting: Physical Therapy

## 2023-10-27 DIAGNOSIS — G8929 Other chronic pain: Secondary | ICD-10-CM

## 2023-10-27 DIAGNOSIS — M25562 Pain in left knee: Secondary | ICD-10-CM | POA: Diagnosis not present

## 2023-10-27 DIAGNOSIS — M5459 Other low back pain: Secondary | ICD-10-CM

## 2023-10-27 NOTE — Therapy (Signed)
 OUTPATIENT PHYSICAL THERAPY LOWER EXTREMITY TREATMENT   Patient Name: [name] MRN: [id number] DOB:[birth year], 68 y.o., female Today's Date: 10/27/2023  END OF SESSION:  PT End of Session - 10/27/23 1447     Visit Number 11    Date for PT Re-Evaluation 12/09/23    Authorization Type UHC    Progress Note Due on Visit 19    PT Start Time 1446    PT Stop Time 1525    PT Time Calculation (min) 39 min    Activity Tolerance Patient tolerated treatment well    Behavior During Therapy WFL for tasks assessed/performed                Past Medical History:  Diagnosis Date   Arthritis    Asthma    CHF (congestive heart failure) (HCC)    Coronary artery disease    Diabetes mellitus    Diabetic neuropathy (HCC)    Dysrhythmia    A.fib    takes Eliquis    GERD (gastroesophageal reflux disease)    History of hiatal hernia    History of kidney stones    Hx of cardiovascular stress test    Lexiscan  Myoview (10/15):  Medium area of ischemia in AL and IL distribution, cannot completely exclude shifting breast attenuation, EF 64%; Abnormal study   Hyperlipidemia    Hypertension    Mild carotid artery disease (HCC)    Morbid obesity (HCC)    PAF (paroxysmal atrial fibrillation) (HCC)    Sleep apnea    does not wear CPAP   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   Syncope    Past Surgical History:  Procedure Laterality Date   ACHILLES TENDON REPAIR  1995   right   BIOPSY  07/22/2021   Procedure: BIOPSY;  Surgeon: Saintclair Jasper, MD;  Location: [facility];  Service: Gastroenterology;;   CATARACT EXTRACTION Bilateral    COLONOSCOPY     COLONOSCOPY WITH PROPOFOL  N/A 07/22/2021   Procedure: COLONOSCOPY WITH PROPOFOL ;  Surgeon: Saintclair Jasper, MD;  Location: [facility];  Service: Gastroenterology;  Laterality: N/A;   CYSTOSCOPY W/ RETROGRADES Right 06/15/2023   Procedure: CYSTOSCOPY, WITH RETROGRADE PYELOGRAM;  Surgeon: Shane Steffan BROCKS, MD;  Location: [facility];  Service: Urology;   Laterality: Right;   CYSTOSCOPY/URETEROSCOPY/HOLMIUM LASER/STENT PLACEMENT Right 06/15/2023   Procedure: CYSTOSCOPY/URETEROSCOPY/HOLMIUM LASER/STENT PLACEMENT;  Surgeon: Shane Steffan BROCKS, MD;  Location: [facility];  Service: Urology;  Laterality: Right;   DILATION AND CURETTAGE OF UTERUS     ESOPHAGOGASTRODUODENOSCOPY (EGD) WITH PROPOFOL  N/A 07/22/2021   Procedure: ESOPHAGOGASTRODUODENOSCOPY (EGD) WITH PROPOFOL ;  Surgeon: Saintclair Jasper, MD;  [facility]: [facility];  Service: Gastroenterology;  Laterality: N/A;   EYE SURGERY Left    detached retina repair   HAND SURGERY Right    right  pinky finger   ORIF   heart stent  2007   PARS PLANA VITRECTOMY Left 03/24/2017   Procedure: PARS PLANA VITRECTOMY WITH 25 GAUGE;  Surgeon: Elner Arley LABOR, MD;  Location: [facility] OR;  Service: Ophthalmology;  Laterality: Left;   POLYPECTOMY  07/22/2021   Procedure: POLYPECTOMY;  Surgeon: Saintclair Jasper, MD;  [facility]: [facility];  Service: Gastroenterology;;   UTERINE FIBROID SURGERY  2008   Patient Active Problem List   Diagnosis Date Noted   Paroxysmal atrial fibrillation with RVR (HCC) 11/28/2020   Hypertensive urgency 11/28/2020   Syncope and collapse 11/28/2020   Nausea 11/28/2020   Hypokalemia 11/28/2020   Hyperglycemia due to diabetes mellitus (HCC) 11/28/2020   GERD (gastroesophageal reflux  disease) 11/28/2020   Asthma 11/28/2020   Coronary atherosclerosis of native coronary artery 07/17/2013   Mixed hyperlipidemia 07/17/2013   Essential hypertension, benign 07/17/2013   Obesity 08/11/2011    PCP: Alberta Sharps  REFERRING PROVIDER: Marcey Her  REFERRING DIAG: Pain in L knee  THERAPY DIAG:  Chronic pain of left knee  Bilateral chronic knee pain  Other low back pain  Rationale for Evaluation and Treatment: Rehabilitation  ONSET DATE: chronic  SUBJECTIVE:   SUBJECTIVE STATEMENT: Pain is a little higher today I don't know why 7/10 L knee    EVAL: My knees hurt, the left one more.  It is to the point where I can't step up or down on the L side. It has started making the back on the R side hurt now. They put a shot in it in June, it doesn't hurt like it did but I still don't have use of it.   PERTINENT HISTORY: Arthritis, CHF, DM, CAD, PAF, Stroke in 2022  PAIN:  Are you having pain? Yes: NPRS scale: 5/10 Pain location: L knee, R low back  Pain description: ache, dull, can take my breath; but has its times where it doesn't hurt and others when it hurts a lot  Aggravating factors: walking, sitting or laying for too long and then trying to get up, depends on position Relieving factors: finding the right position but making sure I still move enough   PRECAUTIONS: None  RED FLAGS: None   WEIGHT BEARING RESTRICTIONS: No  FALLS:  Has patient fallen in last 6 months? No  LIVING ENVIRONMENT: Lives with: lives alone Lives in: House/apartment Stairs: Yes: Internal: steps into the basement steps; on left going up and External: 2 steps; on left going up   OCCUPATION: retired from social services   PLOF: Independent and Independent with basic ADLs  PATIENT GOALS:  to get rid of the pain and be able to walk and move again   NEXT MD VISIT:   OBJECTIVE:  Note: Objective measures were completed at Evaluation unless otherwise noted.  DIAGNOSTIC FINDINGS:   COGNITION: Overall cognitive status: Within functional limits for tasks assessed     SENSATION: WFL   MUSCLE LENGTH: Hamstrings: some tightness in BLE  POSTURE: rounded shoulders  LOWER EXTREMITY ROM: WFL Some pain with hip flexion on both sides in R side LBP Some pain with end range flexion on L knee  LOWER EXTREMITY MMT:  MMT Right eval Left eval  Hip flexion 4- 5  Hip extension    Hip abduction    Hip adduction    Hip internal rotation    Hip external rotation    Knee flexion 4+ 4-  Knee extension 4+ 4+  Ankle dorsiflexion    Ankle plantarflexion    Ankle inversion    Ankle eversion      (Blank rows = not tested)   FUNCTIONAL TESTS:  5 times sit to stand: 24s; 10/14/23 26.8 seconds no UEs  Timed up and go (TUG): 22s; 10/14/23 17 seconds   GAIT: Distance walked: in clinic distances Assistive device utilized: None Level of assistance: Complete Independence and Modified independence Comments: antalgic gait, weight shifting on the RLE, taking quick steps with LLE to avoid using it and weight bearing  TREATMENT DATE:  [facility] Adult PT Treatment:                                                DATE: 10/27/23 Pt seen for aquatic therapy today.  Treatment took place in water  3.5-4.75 ft in depth at the [facility] pool. Temp of water  was 91.  Pt entered/exited the pool via lift (for safety Pt afraid of water ) with hand rail.  *stair negotiation with vc using step to pattern . Verbal reminders on sequencing *walking forward x 4 widths UE support barbell-> walking backward *seated on lift: LAQ; cycling; hip add/abd;  *Side stepping ue support on barbell *Ue support on wall: toe raises; heel raises; hip add/abd; high knee marching; HS curl x10 *seated lift: LAQ *stair negotiation out. VC for proper step to pattern *multiple rest periods throughout session leaning on wall  Pt requires the buoyancy and hydrostatic pressure of water  for support, and to offload joints by unweighting joint load by at least 50 % in navel deep water  and by at least 75-80% in chest to neck deep water .  Viscosity of the water  is needed for resistance of strengthening. Water  current perturbations provides challenge to standing balance requiring increased core activation.         PATIENT EDUCATION:  Education details: POC, HEP Person educated: Patient Education method: Explanation Education comprehension: verbalized understanding  HOME EXERCISE PROGRAM: Access Code:  [email or web address] URL: [email or web address] Date: 09/16/2023 Prepared by: Almetta Fam  Exercises - Sit to Stand  - 1 x daily - 7 x weekly - 2 sets - 10 reps - Seated Long Arc Quad  - 1 x daily - 7 x weekly - 2 sets - 10 reps - 3 hold - Heel Raises with Counter Support  - 1 x daily - 7 x weekly - 2 sets - 10 reps - Supine Lower Trunk Rotation  - 1 x daily - 7 x weekly - 2 sets - 10 reps - Supine Bridge  - 1 x daily - 7 x weekly - 2 sets - 10 reps  ASSESSMENT:  CLINICAL IMPRESSION: Pt demonstrates improvement with dynamic standing balance amb with less guarding in 3.6 ft. She tolerates progressed exercises with vc provided throughout. Instruction on abd bracing for added core engagement. Needs reminders for proper technique for stair climbing. Goals ongoing   Eval:Patient is a 68 y.o. female who was seen today for physical therapy evaluation and treatment for chronic knee pain. She has pain on both sides but it is mostly the L knee that bothers her. Patient reports walking increases her pain and she is unable to do steps going up or down with the LLE. She compensates with her RLE when walking and due to this it has started causing pain in her R lower back. She has had shots in the L knee and it seemed to help for a little bit but she still has pain mostly with any standing and walking activities. Patient will benefit from PT to address her L knee pain as well as the back pain to increase her activity tolerance and improve strength to allow ease with ADLs.  OBJECTIVE IMPAIRMENTS: Abnormal gait, decreased activity tolerance, decreased balance, decreased endurance, difficulty walking, decreased strength, postural dysfunction, and pain.   ACTIVITY LIMITATIONS: squatting, stairs, and locomotion level  PARTICIPATION LIMITATIONS: meal prep, cleaning, laundry, driving, shopping,  community activity, and yard work  PERSONAL FACTORS: Age, Fitness, Past/current experiences, and Time since onset of  injury/illness/exacerbation are also affecting patient's functional outcome.   REHAB POTENTIAL: Good  CLINICAL DECISION MAKING: Stable/uncomplicated  EVALUATION COMPLEXITY: Low  GOALS: Goals reviewed with patient? Yes  SHORT TERM GOALS: Target date: 10/28/23  Patient will be independent with initial HEP. Baseline:  Goal status: MET 10/14/23  2.  Patient will decrease TUG <14s Baseline: 22s Goal status: IN PROGRESS 10/14/23   LONG TERM GOALS: Target date: 12/09/23  Patient will be independent with advanced/ongoing HEP to improve outcomes and carryover.  Baseline:  Goal status: IN PROGRESS 10/14/23  2.  Patient will report at least 75% improvement in  knee pain to improve QOL. (Pain <4/10) Baseline: 8/10 Goal status: IN PROGRESS 10/14/23  3.  Patient will demonstrate improved functional LE strength as demonstrated by 5xSTS <15s. Baseline: 24s Goal status: IN PROGRESS 10/14/23  4. Patient will be able to ascend/descend stairs with 1 HR and reciprocal step pattern safely to access home and community.  Baseline:  Goal status: IN PROGRESS 10/14/23  5.  Patient will tolerate at least 30 min of standing and walking. Baseline: 5-10 mins Goal status: ONGOING 10/14/23    PLAN:  PT FREQUENCY: 2x/week  PT DURATION: 12 weeks  PLANNED INTERVENTIONS: 97110-Therapeutic exercises, 97530- Therapeutic activity, 97112- Neuromuscular re-education, 97535- Self Care, 02859- Manual therapy, [phone]- Gait training, [phone]- Vasopneumatic device, [id number]- Ultrasound, [id number]- Ionotophoresis 4mg /ml Dexamethasone , 79439 (1-2 muscles), 20561 (3+ muscles)- Dry Needling, Patient/Family education, Balance training, Stair training, Taping, Joint mobilization, Joint manipulation, Spinal manipulation, Spinal mobilization, Cryotherapy, and Moist heat  PLAN FOR NEXT SESSION:   Land: progressive PREs, lumbar and hip mobility/flexibility, core; re-assess when she returns to land   Water : per aquatic PT   Ronal Kem)  [place] MPT 10/27/23 2:54 PM [facility] [address] [place], [place], [address] Phone: [phone]   Fax:  [phone]

## 2023-10-27 NOTE — Progress Notes (Signed)
 Cardiology Office Note:  .   Date:  10/28/2023  ID:  [name], DOB 02/21/1955, MRN [id number] PCP: Claudene Pellet, MD  [facility] Providers Cardiologist:  Shelda Bruckner, MD {  History of Present Illness: .   [name] is a 68 y.o. female with PMH CAD, obesity, OSA, paroxysmal atrial fibrillation, diabetes type II. She established care with me on 11/03/22, previously followed by Dr. Dann.  Pertinent CV history: Most recent cath 2012 with patent LAD stent, placed 06/2010. CVA in 11/2020.  Today: Doing aquatherapy, doing well. Tolerating aspirin  81 mg daily, discussed guidelines, she should remain on this unless she has bleeding issues given her known CAD. Working on weight loss, has changed her diet. Has intermittent mild left ankle swelling.   Now on colestipol for diarrhea, with improvement. Confirmed that she is back on ezetimibe .  ROS: Denies chest pain, shortness of breath at rest. No PND, orthopnea, or unexpected weight gain. No syncope or palpitations. ROS otherwise negative except as noted.   Studies Reviewed: SABRA    EKG:       Physical Exam:   VS:  BP 126/76 (BP Location: Left Arm, Patient Position: Sitting, Cuff Size: Large)   Pulse 69   Resp 17   Ht 5' 2 (1.575 m)   Wt 277 lb (125.6 kg)   LMP  (LMP Unknown)   SpO2 97%   BMI 50.66 kg/m    Wt Readings from Last 3 Encounters:  10/28/23 277 lb (125.6 kg)  06/15/23 272 lb (123.4 kg)  06/08/23 272 lb (123.4 kg)    GEN: Well nourished, well developed in no acute distress HEENT: Normal, moist mucous membranes NECK: No JVD CARDIAC: regular rhythm, normal S1 and S2, no rubs or gallops. 1/6 systolic murmur. VASCULAR: Radial and DP pulses 2+ bilaterally. No carotid bruits RESPIRATORY:  Clear to auscultation without rales, wheezing or rhonchi  ABDOMEN: Soft, non-tender, non-distended MUSCULOSKELETAL:  Ambulates independently SKIN: Warm and dry, no edema NEUROLOGIC:  Alert and oriented x 3.  No focal neuro deficits noted. PSYCHIATRIC:  Normal affect    ASSESSMENT AND PLAN: .    CAD with stent 2012 Hypercholesterolemia -last LDL 53 per [facility] 04/2023 -on aspirin  81 mg daily, rosuvastatin  40 mg daily, ezetimibe  10 mg daily -on colestipol for diarrhea -if she has any persistent bleeding issues, would stop aspirin  as she is on apixaban  -on carvedilol , amlodipine  as anti anginal -reviewed red flag warning signs that need immediate medical attention  Hypertension -continue amlodipine  10 mg daily, carvedilol  6.25 mg BID, furosemide  80 mg daily, lisinopril  40 mg daily -if BP elevated consistently >130/80 on home readings, would change lisinopril  to ARB (valsartan, olmesartan, irbesartan preferred). Has been well controlled recently.  CVA 2022 Paroxysmal atrial fibrillation -CHA2DS2/VAS Stroke Risk Points=7  -on apixaban   Morbid obesity, BMI 50 Type II diabetes -on GLP1RA, on 2 mg dose Ozempic weekly. She follows with Dr. Faythe. Defer to him, but could consider changing Ozempic to Mounjaro to see if weight loss restarts.  -did not tolerate SGLT2i due to UTI -on insulin  long term  CV risk counseling and prevention -recommend heart healthy/Mediterranean diet, with whole grains, fruits, vegetable, fish, lean meats, nuts, and olive oil. Limit salt. -recommend moderate walking, 3-5 times/week for 30-50 minutes each session. Aim for at least 150 minutes.week. Goal should be pace of 3 miles/hours, or walking 1.5 miles in 30 minutes -recommend avoidance of tobacco products. Avoid excess alcohol.  Dispo: 12 mos  Signed, Shelda Bruckner,  MD   Shelda Bruckner, MD, PhD, [facility]  [facility]  [place]  [facility] & Vascular at [facility] at [facility] [address], [address] [place], [place] [address] [phone]

## 2023-10-28 ENCOUNTER — Encounter (HOSPITAL_BASED_OUTPATIENT_CLINIC_OR_DEPARTMENT_OTHER): Payer: Self-pay | Admitting: Cardiology

## 2023-10-28 ENCOUNTER — Ambulatory Visit (INDEPENDENT_AMBULATORY_CARE_PROVIDER_SITE_OTHER): Admitting: Cardiology

## 2023-10-28 VITALS — BP 126/76 | HR 69 | Resp 17 | Ht 62.0 in | Wt 277.0 lb

## 2023-10-28 DIAGNOSIS — I480 Paroxysmal atrial fibrillation: Secondary | ICD-10-CM | POA: Diagnosis not present

## 2023-10-28 DIAGNOSIS — I10 Essential (primary) hypertension: Secondary | ICD-10-CM

## 2023-10-28 DIAGNOSIS — I2510 Atherosclerotic heart disease of native coronary artery without angina pectoris: Secondary | ICD-10-CM | POA: Diagnosis not present

## 2023-10-28 DIAGNOSIS — Z8673 Personal history of transient ischemic attack (TIA), and cerebral infarction without residual deficits: Secondary | ICD-10-CM

## 2023-10-28 DIAGNOSIS — Z7901 Long term (current) use of anticoagulants: Secondary | ICD-10-CM

## 2023-10-28 DIAGNOSIS — E669 Obesity, unspecified: Secondary | ICD-10-CM

## 2023-10-28 DIAGNOSIS — E1169 Type 2 diabetes mellitus with other specified complication: Secondary | ICD-10-CM

## 2023-10-28 DIAGNOSIS — D6869 Other thrombophilia: Secondary | ICD-10-CM

## 2023-10-28 NOTE — Patient Instructions (Signed)
 Medication Instructions:  No changes *If you need a refill on your cardiac medications before your next appointment, please call your pharmacy*  Lab Work: none  Testing/Procedures: none  Follow-Up: At [facility], you and your health needs are our priority.  As part of our continuing mission to provide you with exceptional heart care, our providers are all part of one team.  This team includes your primary Cardiologist (physician) and Advanced Practice Providers or APPs (Physician Assistants and Nurse Practitioners) who all work together to provide you with the care you need, when you need it.  Your next appointment:   12 month(s)  Provider:   Shelda Bruckner, MD

## 2023-11-03 ENCOUNTER — Encounter (HOSPITAL_BASED_OUTPATIENT_CLINIC_OR_DEPARTMENT_OTHER): Payer: Self-pay | Admitting: Physical Therapy

## 2023-11-03 ENCOUNTER — Ambulatory Visit (HOSPITAL_BASED_OUTPATIENT_CLINIC_OR_DEPARTMENT_OTHER): Admitting: Physical Therapy

## 2023-11-03 DIAGNOSIS — M5459 Other low back pain: Secondary | ICD-10-CM

## 2023-11-03 DIAGNOSIS — G8929 Other chronic pain: Secondary | ICD-10-CM

## 2023-11-03 DIAGNOSIS — M25562 Pain in left knee: Secondary | ICD-10-CM | POA: Diagnosis not present

## 2023-11-03 NOTE — Therapy (Signed)
 OUTPATIENT PHYSICAL THERAPY LOWER EXTREMITY TREATMENT   Patient Name: [name] MRN: [id number] DOB:[birth year], 68 y.o., female Today's Date: 11/03/2023  END OF SESSION:  PT End of Session - 11/03/23 1322     Visit Number 12    Date for Recertification  12/09/23    Authorization Type U[employer]    Authorization - Visit Number 12    Authorization - Number of Visits 16    Progress Note Due on Visit 19    PT Start Time 1317    PT Stop Time 1355    PT Time Calculation (min) 38 min    Activity Tolerance Patient tolerated treatment well    Behavior During Therapy WFL for tasks assessed/performed                 Past Medical History:  Diagnosis Date   Arthritis    Asthma    CHF (congestive heart failure) (HCC)    Coronary artery disease    Diabetes mellitus    Diabetic neuropathy (HCC)    Dysrhythmia    A.fib    takes Eliquis    GERD (gastroesophageal reflux disease)    History of hiatal hernia    History of kidney stones    Hx of cardiovascular stress test    Lexiscan  Myoview (10/15):  Medium area of ischemia in AL and IL distribution, cannot completely exclude shifting breast attenuation, EF 64%; Abnormal study   Hyperlipidemia    Hypertension    Mild carotid artery disease    Morbid obesity (HCC)    PAF (paroxysmal atrial fibrillation) (HCC)    Sleep apnea    does not wear CPAP   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   Syncope    Past Surgical History:  Procedure Laterality Date   ACHILLES TENDON REPAIR  1995   right   BIOPSY  07/22/2021   Procedure: BIOPSY;  Surgeon: Saintclair Jasper, MD;  Location: [facility];  Service: Gastroenterology;;   CATARACT EXTRACTION Bilateral    COLONOSCOPY     COLONOSCOPY WITH PROPOFOL  N/A 07/22/2021   Procedure: COLONOSCOPY WITH PROPOFOL ;  Surgeon: Saintclair Jasper, MD;  Location: [facility];  Service: Gastroenterology;  Laterality: N/A;   CYSTOSCOPY W/ RETROGRADES Right 06/15/2023   Procedure: CYSTOSCOPY, WITH RETROGRADE PYELOGRAM;   Surgeon: Shane Steffan BROCKS, MD;  Location: [facility];  Service: Urology;  Laterality: Right;   CYSTOSCOPY/URETEROSCOPY/HOLMIUM LASER/STENT PLACEMENT Right 06/15/2023   Procedure: CYSTOSCOPY/URETEROSCOPY/HOLMIUM LASER/STENT PLACEMENT;  Surgeon: Shane Steffan BROCKS, MD;  Location: [facility];  Service: Urology;  Laterality: Right;   DILATION AND CURETTAGE OF UTERUS     ESOPHAGOGASTRODUODENOSCOPY (EGD) WITH PROPOFOL  N/A 07/22/2021   Procedure: ESOPHAGOGASTRODUODENOSCOPY (EGD) WITH PROPOFOL ;  Surgeon: Saintclair Jasper, MD;  Location: [facility];  Service: Gastroenterology;  Laterality: N/A;   EYE SURGERY Left    detached retina repair   HAND SURGERY Right    right  pinky finger   ORIF   heart stent  2007   PARS PLANA VITRECTOMY Left 03/24/2017   Procedure: PARS PLANA VITRECTOMY WITH 25 GAUGE;  Surgeon: Elner Arley LABOR, MD;  Location: [facility] OR;  Service: Ophthalmology;  Laterality: Left;   POLYPECTOMY  07/22/2021   Procedure: POLYPECTOMY;  Surgeon: Saintclair Jasper, MD;  Location: [facility];  Service: Gastroenterology;;   UTERINE FIBROID SURGERY  2008   Patient Active Problem List   Diagnosis Date Noted   Paroxysmal atrial fibrillation with RVR (HCC) 11/28/2020   Hypertensive urgency 11/28/2020   Syncope and collapse 11/28/2020   Nausea 11/28/2020  Hypokalemia 11/28/2020   Hyperglycemia due to diabetes mellitus (HCC) 11/28/2020   GERD (gastroesophageal reflux disease) 11/28/2020   Asthma 11/28/2020   Coronary atherosclerosis of native coronary artery 07/17/2013   Mixed hyperlipidemia 07/17/2013   Essential hypertension, benign 07/17/2013   Obesity 08/11/2011    PCP: Alberta Sharps  REFERRING PROVIDER: Marcey Her  REFERRING DIAG: Pain in L knee  THERAPY DIAG:  Chronic pain of left knee  Bilateral chronic knee pain  Other low back pain  Rationale for Evaluation and Treatment: Rehabilitation  ONSET DATE: chronic  SUBJECTIVE:   SUBJECTIVE STATEMENT: Left knee is hurting 7/10.  I  decided to leave my cane in the car and decided 1/2 way here that it was a bad idea back 5/10    EVAL: My knees hurt, the left one more. It is to the point where I can't step up or down on the L side. It has started making the back on the R side hurt now. They put a shot in it in June, it doesn't hurt like it did but I still don't have use of it.   PERTINENT HISTORY: Arthritis, CHF, DM, CAD, PAF, Stroke in 2022  PAIN:  Are you having pain? Yes: NPRS scale: 5/10 Pain location: L knee, R low back  Pain description: ache, dull, can take my breath; but has its times where it doesn't hurt and others when it hurts a lot  Aggravating factors: walking, sitting or laying for too long and then trying to get up, depends on position Relieving factors: finding the right position but making sure I still move enough   PRECAUTIONS: None  RED FLAGS: None   WEIGHT BEARING RESTRICTIONS: No  FALLS:  Has patient fallen in last 6 months? No  LIVING ENVIRONMENT: Lives with: lives alone Lives in: House/apartment Stairs: Yes: Internal: steps into the basement steps; on left going up and External: 2 steps; on left going up   OCCUPATION: retired from social services   PLOF: Independent and Independent with basic ADLs  PATIENT GOALS:  to get rid of the pain and be able to walk and move again   NEXT MD VISIT:   OBJECTIVE:  Note: Objective measures were completed at Evaluation unless otherwise noted.  DIAGNOSTIC FINDINGS:   COGNITION: Overall cognitive status: Within functional limits for tasks assessed     SENSATION: WFL   MUSCLE LENGTH: Hamstrings: some tightness in BLE  POSTURE: rounded shoulders  LOWER EXTREMITY ROM: WFL Some pain with hip flexion on both sides in R side LBP Some pain with end range flexion on L knee  LOWER EXTREMITY MMT:  MMT Right eval Left eval  Hip flexion 4- 5  Hip extension    Hip abduction    Hip adduction    Hip internal rotation    Hip external  rotation    Knee flexion 4+ 4-  Knee extension 4+ 4+  Ankle dorsiflexion    Ankle plantarflexion    Ankle inversion    Ankle eversion     (Blank rows = not tested)   FUNCTIONAL TESTS:  5 times sit to stand: 24s; 10/14/23 26.8 seconds no UEs  Timed up and go (TUG): 22s; 10/14/23 17 seconds   GAIT: Distance walked: in clinic distances Assistive device utilized: None Level of assistance: Complete Independence and Modified independence Comments: antalgic gait, weight shifting on the RLE, taking quick steps with LLE to avoid using it and weight bearing  TREATMENT DATE:  [facility] Adult PT Treatment:                                                DATE: 10/27/23 Pt seen for aquatic therapy today.  Treatment took place in water  3.5-4.75 ft in depth at the [facility] pool. Temp of water  was 91.  Pt entered/exited the pool via lift (for safety Pt afraid of water ) with hand rail.  *stair negotiation with self cuing indep ue support hand rail. *walking forward /back/ side stepping ue support barbell *forward march with knee kick ue support barbell (good balance challenge)  - seated rest period *seated on lift: LAQ; cycling; hip add/abd;  *Ue support on wall increased depth to 3.8: toe/heel raises; hip add/abd 2x5; high knee marching; HS curl 2x5 *1/2 noodle pull down for TrA engagement wide stance then staggered x5-8 ea position *stair negotiation out without cues for execution.  Completes indep with ease   Pt requires the buoyancy and hydrostatic pressure of water  for support, and to offload joints by unweighting joint load by at least 50 % in navel deep water  and by at least 75-80% in chest to neck deep water .  Viscosity of the water  is needed for resistance of strengthening. Water  current perturbations provides challenge to standing balance requiring increased  core activation.         PATIENT EDUCATION:  Education details: POC, HEP Person educated: Patient Education method: Explanation Education comprehension: verbalized understanding  HOME EXERCISE PROGRAM: Access Code: [email or web address] URL: [email or web address] Date: 09/16/2023 Prepared by: Almetta Fam  Exercises - Sit to Stand  - 1 x daily - 7 x weekly - 2 sets - 10 reps - Seated Long Arc Quad  - 1 x daily - 7 x weekly - 2 sets - 10 reps - 3 hold - Heel Raises with Counter Support  - 1 x daily - 7 x weekly - 2 sets - 10 reps - Supine Lower Trunk Rotation  - 1 x daily - 7 x weekly - 2 sets - 10 reps - Supine Bridge  - 1 x daily - 7 x weekly - 2 sets - 10 reps  ASSESSMENT:  CLINICAL IMPRESSION:  Pt demonstrates Indep with stair climbing execution self cuing.  She completes without pain. She requires less rest periods today throughout session demonstrating increase in activity toleration. Added core strengthening and more balance challenges with good toleration.Goals ongoing    Eval:Patient is a 68 y.o. female who was seen today for physical therapy evaluation and treatment for chronic knee pain. She has pain on both sides but it is mostly the L knee that bothers her. Patient reports walking increases her pain and she is unable to do steps going up or down with the LLE. She compensates with her RLE when walking and due to this it has started causing pain in her R lower back. She has had shots in the L knee and it seemed to help for a little bit but she still has pain mostly with any standing and walking activities. Patient will benefit from PT to address her L knee pain as well as the back pain to increase her activity tolerance and improve strength to allow ease with ADLs.  OBJECTIVE IMPAIRMENTS: Abnormal gait, decreased activity tolerance, decreased balance, decreased endurance, difficulty walking, decreased strength, postural dysfunction, and pain.   ACTIVITY  LIMITATIONS:  squatting, stairs, and locomotion level  PARTICIPATION LIMITATIONS: meal prep, cleaning, laundry, driving, shopping, community activity, and yard work  PERSONAL FACTORS: Age, Fitness, Past/current experiences, and Time since onset of injury/illness/exacerbation are also affecting patient's functional outcome.   REHAB POTENTIAL: Good  CLINICAL DECISION MAKING: Stable/uncomplicated  EVALUATION COMPLEXITY: Low  GOALS: Goals reviewed with patient? Yes  SHORT TERM GOALS: Target date: 10/28/23  Patient will be independent with initial HEP. Baseline:  Goal status: MET 10/14/23  2.  Patient will decrease TUG <14s Baseline: 22s Goal status: IN PROGRESS 10/14/23   LONG TERM GOALS: Target date: 12/09/23  Patient will be independent with advanced/ongoing HEP to improve outcomes and carryover.  Baseline:  Goal status: IN PROGRESS 10/14/23  2.  Patient will report at least 75% improvement in  knee pain to improve QOL. (Pain <4/10) Baseline: 8/10 Goal status: IN PROGRESS 10/14/23  3.  Patient will demonstrate improved functional LE strength as demonstrated by 5xSTS <15s. Baseline: 24s Goal status: IN PROGRESS 10/14/23  4. Patient will be able to ascend/descend stairs with 1 HR and reciprocal step pattern safely to access home and community.  Baseline:  Goal status: IN PROGRESS 10/14/23  5.  Patient will tolerate at least 30 min of standing and walking. Baseline: 5-10 mins Goal status: ONGOING 10/14/23    PLAN:  PT FREQUENCY: 2x/week  PT DURATION: 12 weeks  PLANNED INTERVENTIONS: 97110-Therapeutic exercises, 97530- Therapeutic activity, 97112- Neuromuscular re-education, 97535- Self Care, 02859- Manual therapy, [phone]- Gait training, [phone]- Vasopneumatic device, [id number]- Ultrasound, [id number]- Ionotophoresis 4mg /ml Dexamethasone , 79439 (1-2 muscles), [id number] (3+ muscles)- Dry Needling, Patient/Family education, Balance training, Stair training, Taping, Joint mobilization, Joint manipulation, Spinal  manipulation, Spinal mobilization, Cryotherapy, and Moist heat  PLAN FOR NEXT SESSION:   Land: progressive PREs, lumbar and hip mobility/flexibility, core; re-assess when she returns to land   Water : per aquatic PT   Ronal Kem) [place] MPT 11/03/23 1:38 PM [facility] [address] [place], [place], [address] Phone: [phone]   Fax:  [phone]

## 2023-11-04 ENCOUNTER — Ambulatory Visit (HOSPITAL_BASED_OUTPATIENT_CLINIC_OR_DEPARTMENT_OTHER): Admitting: Cardiology

## 2023-11-05 ENCOUNTER — Encounter (HOSPITAL_BASED_OUTPATIENT_CLINIC_OR_DEPARTMENT_OTHER): Payer: Self-pay | Admitting: Physical Therapy

## 2023-11-05 ENCOUNTER — Ambulatory Visit (HOSPITAL_BASED_OUTPATIENT_CLINIC_OR_DEPARTMENT_OTHER): Admitting: Physical Therapy

## 2023-11-05 DIAGNOSIS — M25562 Pain in left knee: Secondary | ICD-10-CM | POA: Diagnosis not present

## 2023-11-05 DIAGNOSIS — M5459 Other low back pain: Secondary | ICD-10-CM

## 2023-11-05 DIAGNOSIS — G8929 Other chronic pain: Secondary | ICD-10-CM

## 2023-11-05 NOTE — Therapy (Signed)
 OUTPATIENT PHYSICAL THERAPY LOWER EXTREMITY TREATMENT   Patient Name: [name] MRN: [id number] DOB:1955/05/12, 68 y.o., female Today's Date: 11/05/2023  END OF SESSION:  PT End of Session - 11/05/23 0838     Visit Number 13    Date for Recertification  12/09/23    Authorization Type [employer]    Authorization - Number of Visits 16    Progress Note Due on Visit 19    PT Start Time 0840    PT Stop Time 0920    PT Time Calculation (min) 40 min    Activity Tolerance Patient tolerated treatment well    Behavior During Therapy WFL for tasks assessed/performed                 Past Medical History:  Diagnosis Date   Arthritis    Asthma    CHF (congestive heart failure) (HCC)    Coronary artery disease    Diabetes mellitus    Diabetic neuropathy (HCC)    Dysrhythmia    A.fib    takes Eliquis    GERD (gastroesophageal reflux disease)    History of hiatal hernia    History of kidney stones    Hx of cardiovascular stress test    Lexiscan  Myoview (10/15):  Medium area of ischemia in AL and IL distribution, cannot completely exclude shifting breast attenuation, EF 64%; Abnormal study   Hyperlipidemia    Hypertension    Mild carotid artery disease    Morbid obesity (HCC)    PAF (paroxysmal atrial fibrillation) (HCC)    Sleep apnea    does not wear CPAP   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   Syncope    Past Surgical History:  Procedure Laterality Date   ACHILLES TENDON REPAIR  1995   right   BIOPSY  07/22/2021   Procedure: BIOPSY;  Surgeon: Saintclair Jasper, MD;  Location: [facility];  Service: Gastroenterology;;   CATARACT EXTRACTION Bilateral    COLONOSCOPY     COLONOSCOPY WITH PROPOFOL  N/A 07/22/2021   Procedure: COLONOSCOPY WITH PROPOFOL ;  Surgeon: Saintclair Jasper, MD;  Location: [facility];  Service: Gastroenterology;  Laterality: N/A;   CYSTOSCOPY W/ RETROGRADES Right 06/15/2023   Procedure: CYSTOSCOPY, WITH RETROGRADE PYELOGRAM;  Surgeon: Shane Steffan BROCKS, MD;   Location: [facility];  Service: Urology;  Laterality: Right;   CYSTOSCOPY/URETEROSCOPY/HOLMIUM LASER/STENT PLACEMENT Right 06/15/2023   Procedure: CYSTOSCOPY/URETEROSCOPY/HOLMIUM LASER/STENT PLACEMENT;  Surgeon: Shane Steffan BROCKS, MD;  Location: [facility];  Service: Urology;  Laterality: Right;   DILATION AND CURETTAGE OF UTERUS     ESOPHAGOGASTRODUODENOSCOPY (EGD) WITH PROPOFOL  N/A 07/22/2021   Procedure: ESOPHAGOGASTRODUODENOSCOPY (EGD) WITH PROPOFOL ;  Surgeon: Saintclair Jasper, MD;  Location: [facility];  Service: Gastroenterology;  Laterality: N/A;   EYE SURGERY Left    detached retina repair   HAND SURGERY Right    right  pinky finger   ORIF   heart stent  2007   PARS PLANA VITRECTOMY Left 03/24/2017   Procedure: PARS PLANA VITRECTOMY WITH 25 GAUGE;  Surgeon: Elner Arley LABOR, MD;  Location: [facility];  Service: Ophthalmology;  Laterality: Left;   POLYPECTOMY  07/22/2021   Procedure: POLYPECTOMY;  Surgeon: Saintclair Jasper, MD;  Location: [facility];  Service: Gastroenterology;;   UTERINE FIBROID SURGERY  2008   Patient Active Problem List   Diagnosis Date Noted   Paroxysmal atrial fibrillation with RVR (HCC) 11/28/2020   Hypertensive urgency 11/28/2020   Syncope and collapse 11/28/2020   Nausea 11/28/2020   Hypokalemia 11/28/2020   Hyperglycemia due to  diabetes mellitus (HCC) 11/28/2020   GERD (gastroesophageal reflux disease) 11/28/2020   Asthma 11/28/2020   Coronary atherosclerosis of native coronary artery 07/17/2013   Mixed hyperlipidemia 07/17/2013   Essential hypertension, benign 07/17/2013   Obesity 08/11/2011    PCP: Alberta Sharps  REFERRING PROVIDER: Marcey Her  REFERRING DIAG: Pain in L knee  THERAPY DIAG:  Chronic pain of left knee  Bilateral chronic knee pain  Other low back pain  Rationale for Evaluation and Treatment: Rehabilitation  ONSET DATE: chronic  SUBJECTIVE:   SUBJECTIVE STATEMENT: Back is really bothering me today.  May be the weather although I did  walk a lot yesterday.back 5/10    EVAL: My knees hurt, the left one more. It is to the point where I can't step up or down on the L side. It has started making the back on the R side hurt now. They put a shot in it in June, it doesn't hurt like it did but I still don't have use of it.   PERTINENT HISTORY: Arthritis, CHF, DM, CAD, PAF, Stroke in 2022  PAIN:  Are you having pain? Yes: NPRS scale: 5/10 Pain location: L knee, R low back  Pain description: ache, dull, can take my breath; but has its times where it doesn't hurt and others when it hurts a lot  Aggravating factors: walking, sitting or laying for too long and then trying to get up, depends on position Relieving factors: finding the right position but making sure I still move enough   PRECAUTIONS: None  RED FLAGS: None   WEIGHT BEARING RESTRICTIONS: No  FALLS:  Has patient fallen in last 6 months? No  LIVING ENVIRONMENT: Lives with: lives alone Lives in: House/apartment Stairs: Yes: Internal: steps into the basement steps; on left going up and External: 2 steps; on left going up   OCCUPATION: retired from social services   PLOF: Independent and Independent with basic ADLs  PATIENT GOALS:  to get rid of the pain and be able to walk and move again   NEXT MD VISIT:   OBJECTIVE:  Note: Objective measures were completed at Evaluation unless otherwise noted.  DIAGNOSTIC FINDINGS:   COGNITION: Overall cognitive status: Within functional limits for tasks assessed     SENSATION: WFL   MUSCLE LENGTH: Hamstrings: some tightness in BLE  POSTURE: rounded shoulders  LOWER EXTREMITY ROM: WFL Some pain with hip flexion on both sides in R side LBP Some pain with end range flexion on L knee  LOWER EXTREMITY MMT:  MMT Right eval Left eval  Hip flexion 4- 5  Hip extension    Hip abduction    Hip adduction    Hip internal rotation    Hip external rotation    Knee flexion 4+ 4-  Knee extension 4+ 4+  Ankle  dorsiflexion    Ankle plantarflexion    Ankle inversion    Ankle eversion     (Blank rows = not tested)   FUNCTIONAL TESTS:  5 times sit to stand: 24s; 10/14/23 26.8 seconds no UEs  Timed up and go (TUG): 22s; 10/14/23 17 seconds   GAIT: Distance walked: in clinic distances Assistive device utilized: None Level of assistance: Complete Independence and Modified independence Comments: antalgic gait, weight shifting on the RLE, taking quick steps with LLE to avoid using it and weight bearing  TREATMENT DATE:  [facility] Adult PT Treatment:                                                DATE: 11/05/23 Pt seen for aquatic therapy today.  Treatment took place in water  3.5-4.75 ft in depth at the [facility] pool. Temp of water  was 91.  Pt entered/exited the pool via lift (for safety Pt afraid of water ) with hand rail.  *Indep stair negotiation using proper technique *walking forward /back/ side stepping ue support barbell *L stretch  - seated rest period *forward march with knee kick ue support barbell (good balance challenge) *seated on lift: LAQ; cycling; hip add/abd;  *Ue support on wall increased depth to 3.8: toe/heel raises; hip add/abd 2x5; high knee marching; HS curl 2x5, hip ext *1/2 noodle pull down for TrA engagement wide stance then staggered x5-8 ea position *stair negotiation out requires reminder to continue with step to technique   Pt requires the buoyancy and hydrostatic pressure of water  for support, and to offload joints by unweighting joint load by at least 50 % in navel deep water  and by at least 75-80% in chest to neck deep water .  Viscosity of the water  is needed for resistance of strengthening. Water  current perturbations provides challenge to standing balance requiring increased core activation.         PATIENT EDUCATION:  Education  details: POC, HEP Person educated: Patient Education method: Explanation Education comprehension: verbalized understanding  HOME EXERCISE PROGRAM: Access Code: [email or web address] URL: [email or web address] Date: 09/16/2023 Prepared by: Almetta Fam  Exercises - Sit to Stand  - 1 x daily - 7 x weekly - 2 sets - 10 reps - Seated Long Arc Quad  - 1 x daily - 7 x weekly - 2 sets - 10 reps - 3 hold - Heel Raises with Counter Support  - 1 x daily - 7 x weekly - 2 sets - 10 reps - Supine Lower Trunk Rotation  - 1 x daily - 7 x weekly - 2 sets - 10 reps - Supine Bridge  - 1 x daily - 7 x weekly - 2 sets - 10 reps  ASSESSMENT:  CLINICAL IMPRESSION:  Pt arrives with slight increase in pain.  Did amb a lot yesterday with sister.  Focused on general movement and strengthening with successful reduction in pain by end of session.  She does require extra rest periods. She demonstrates improving balance submerged without any unsteadiness nor LOB throughout session today.      Eval:Patient is a 68 y.o. female who was seen today for physical therapy evaluation and treatment for chronic knee pain. She has pain on both sides but it is mostly the L knee that bothers her. Patient reports walking increases her pain and she is unable to do steps going up or down with the LLE. She compensates with her RLE when walking and due to this it has started causing pain in her R lower back. She has had shots in the L knee and it seemed to help for a little bit but she still has pain mostly with any standing and walking activities. Patient will benefit from PT to address her L knee pain as well as the back pain to increase her activity tolerance and improve strength to allow ease with ADLs.  OBJECTIVE IMPAIRMENTS: Abnormal gait, decreased activity tolerance, decreased  balance, decreased endurance, difficulty walking, decreased strength, postural dysfunction, and pain.   ACTIVITY LIMITATIONS: squatting, stairs, and  locomotion level  PARTICIPATION LIMITATIONS: meal prep, cleaning, laundry, driving, shopping, community activity, and yard work  PERSONAL FACTORS: Age, Fitness, Past/current experiences, and Time since onset of injury/illness/exacerbation are also affecting patient's functional outcome.   REHAB POTENTIAL: Good  CLINICAL DECISION MAKING: Stable/uncomplicated  EVALUATION COMPLEXITY: Low  GOALS: Goals reviewed with patient? Yes  SHORT TERM GOALS: Target date: 10/28/23  Patient will be independent with initial HEP. Baseline:  Goal status: MET 10/14/23  2.  Patient will decrease TUG <14s Baseline: 22s Goal status: IN PROGRESS 10/14/23   LONG TERM GOALS: Target date: 12/09/23  Patient will be independent with advanced/ongoing HEP to improve outcomes and carryover.  Baseline:  Goal status: IN PROGRESS 10/14/23  2.  Patient will report at least 75% improvement in  knee pain to improve QOL. (Pain <4/10) Baseline: 8/10 Goal status: IN PROGRESS 10/14/23  3.  Patient will demonstrate improved functional LE strength as demonstrated by 5xSTS <15s. Baseline: 24s Goal status: IN PROGRESS 10/14/23  4. Patient will be able to ascend/descend stairs with 1 HR and reciprocal step pattern safely to access home and community.  Baseline:  Goal status: IN PROGRESS 10/14/23  5.  Patient will tolerate at least 30 min of standing and walking. Baseline: 5-10 mins Goal status: ONGOING 10/14/23    PLAN:  PT FREQUENCY: 2x/week  PT DURATION: 12 weeks  PLANNED INTERVENTIONS: 97110-Therapeutic exercises, 97530- Therapeutic activity, 97112- Neuromuscular re-education, 97535- Self Care, 02859- Manual therapy, [phone]- Gait training, [phone]- Vasopneumatic device, [id number]- Ultrasound, [id number]- Ionotophoresis 4mg /ml Dexamethasone , 79439 (1-2 muscles), [id number] (3+ muscles)- Dry Needling, Patient/Family education, Balance training, Stair training, Taping, Joint mobilization, Joint manipulation, Spinal manipulation, Spinal  mobilization, Cryotherapy, and Moist heat  PLAN FOR NEXT SESSION:   Land: progressive PREs, lumbar and hip mobility/flexibility, core; re-assess when she returns to land   Water : per aquatic PT   Ronal Kem) Hannelore Bova MPT 11/05/23 8:41 AM [facility] [address] [place], [place], [address] Phone: [phone]   Fax:  [phone]

## 2023-11-08 ENCOUNTER — Ambulatory Visit (HOSPITAL_BASED_OUTPATIENT_CLINIC_OR_DEPARTMENT_OTHER): Admitting: Physical Therapy

## 2023-11-08 ENCOUNTER — Encounter (HOSPITAL_BASED_OUTPATIENT_CLINIC_OR_DEPARTMENT_OTHER): Payer: Self-pay | Admitting: Physical Therapy

## 2023-11-08 DIAGNOSIS — G8929 Other chronic pain: Secondary | ICD-10-CM

## 2023-11-08 DIAGNOSIS — M5459 Other low back pain: Secondary | ICD-10-CM

## 2023-11-08 DIAGNOSIS — M25562 Pain in left knee: Secondary | ICD-10-CM | POA: Diagnosis not present

## 2023-11-08 NOTE — Therapy (Signed)
 OUTPATIENT PHYSICAL THERAPY LOWER EXTREMITY TREATMENT   Patient Name: [name] MRN: [id number] DOB:[birth year], 68 y.o., female Today's Date: 11/08/2023  END OF SESSION:  PT End of Session - 11/08/23 0851     Visit Number 14    Date for Recertification  12/09/23    Authorization Type [employer]    Authorization - Number of Visits 16    Progress Note Due on Visit 19    PT Start Time 0850    PT Stop Time 0930    PT Time Calculation (min) 40 min    Activity Tolerance Patient tolerated treatment well    Behavior During Therapy WFL for tasks assessed/performed                 Past Medical History:  Diagnosis Date   Arthritis    Asthma    CHF (congestive heart failure) (HCC)    Coronary artery disease    Diabetes mellitus    Diabetic neuropathy (HCC)    Dysrhythmia    A.fib    takes Eliquis    GERD (gastroesophageal reflux disease)    History of hiatal hernia    History of kidney stones    Hx of cardiovascular stress test    Lexiscan  Myoview (10/15):  Medium area of ischemia in AL and IL distribution, cannot completely exclude shifting breast attenuation, EF 64%; Abnormal study   Hyperlipidemia    Hypertension    Mild carotid artery disease    Morbid obesity (HCC)    PAF (paroxysmal atrial fibrillation) (HCC)    Sleep apnea    does not wear CPAP   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   Syncope    Past Surgical History:  Procedure Laterality Date   ACHILLES TENDON REPAIR  1995   right   BIOPSY  07/22/2021   Procedure: BIOPSY;  Surgeon: Saintclair Jasper, MD;  Location: [facility];  Service: Gastroenterology;;   CATARACT EXTRACTION Bilateral    COLONOSCOPY     COLONOSCOPY WITH PROPOFOL  N/A 07/22/2021   Procedure: COLONOSCOPY WITH PROPOFOL ;  Surgeon: Saintclair Jasper, MD;  Location: [facility];  Service: Gastroenterology;  Laterality: N/A;   CYSTOSCOPY W/ RETROGRADES Right 06/15/2023   Procedure: CYSTOSCOPY, WITH RETROGRADE PYELOGRAM;  Surgeon: Shane Steffan BROCKS, MD;   Location: [facility];  Service: Urology;  Laterality: Right;   CYSTOSCOPY/URETEROSCOPY/HOLMIUM LASER/STENT PLACEMENT Right 06/15/2023   Procedure: CYSTOSCOPY/URETEROSCOPY/HOLMIUM LASER/STENT PLACEMENT;  Surgeon: Shane Steffan BROCKS, MD;  Location: [facility];  Service: Urology;  Laterality: Right;   DILATION AND CURETTAGE OF UTERUS     ESOPHAGOGASTRODUODENOSCOPY (EGD) WITH PROPOFOL  N/A 07/22/2021   Procedure: ESOPHAGOGASTRODUODENOSCOPY (EGD) WITH PROPOFOL ;  Surgeon: Saintclair Jasper, MD;  Location: [facility];  Service: Gastroenterology;  Laterality: N/A;   EYE SURGERY Left    detached retina repair   HAND SURGERY Right    right  pinky finger   ORIF   heart stent  2007   PARS PLANA VITRECTOMY Left 03/24/2017   Procedure: PARS PLANA VITRECTOMY WITH 25 GAUGE;  Surgeon: Elner Arley LABOR, MD;  Location: [facility];  Service: Ophthalmology;  Laterality: Left;   POLYPECTOMY  07/22/2021   Procedure: POLYPECTOMY;  Surgeon: Saintclair Jasper, MD;  Location: [facility];  Service: Gastroenterology;;   UTERINE FIBROID SURGERY  2008   Patient Active Problem List   Diagnosis Date Noted   Paroxysmal atrial fibrillation with RVR (HCC) 11/28/2020   Hypertensive urgency 11/28/2020   Syncope and collapse 11/28/2020   Nausea 11/28/2020   Hypokalemia 11/28/2020   Hyperglycemia due to  diabetes mellitus (HCC) 11/28/2020   GERD (gastroesophageal reflux disease) 11/28/2020   Asthma 11/28/2020   Coronary atherosclerosis of native coronary artery 07/17/2013   Mixed hyperlipidemia 07/17/2013   Essential hypertension, benign 07/17/2013   Obesity 08/11/2011    PCP: Alberta Sharps  REFERRING PROVIDER: Marcey Her  REFERRING DIAG: Pain in L knee  THERAPY DIAG:  Chronic pain of left knee  Bilateral chronic knee pain  Other low back pain  Rationale for Evaluation and Treatment: Rehabilitation  ONSET DATE: chronic  SUBJECTIVE:   SUBJECTIVE STATEMENT: Back is really bothering me today.  May be the weather although I did  walk a lot yesterday.back 5/10    EVAL: My knees hurt, the left one more. It is to the point where I can't step up or down on the L side. It has started making the back on the R side hurt now. They put a shot in it in June, it doesn't hurt like it did but I still don't have use of it.   PERTINENT HISTORY: Arthritis, CHF, DM, CAD, PAF, Stroke in 2022  PAIN:  Are you having pain? Yes: NPRS scale: 5/10 Pain location: L knee, R low back  Pain description: ache, dull, can take my breath; but has its times where it doesn't hurt and others when it hurts a lot  Aggravating factors: walking, sitting or laying for too long and then trying to get up, depends on position Relieving factors: finding the right position but making sure I still move enough   PRECAUTIONS: None  RED FLAGS: None   WEIGHT BEARING RESTRICTIONS: No  FALLS:  Has patient fallen in last 6 months? No  LIVING ENVIRONMENT: Lives with: lives alone Lives in: House/apartment Stairs: Yes: Internal: steps into the basement steps; on left going up and External: 2 steps; on left going up   OCCUPATION: retired from social services   PLOF: Independent and Independent with basic ADLs  PATIENT GOALS:  to get rid of the pain and be able to walk and move again   NEXT MD VISIT:   OBJECTIVE:  Note: Objective measures were completed at Evaluation unless otherwise noted.  DIAGNOSTIC FINDINGS:   COGNITION: Overall cognitive status: Within functional limits for tasks assessed     SENSATION: WFL   MUSCLE LENGTH: Hamstrings: some tightness in BLE  POSTURE: rounded shoulders  LOWER EXTREMITY ROM: WFL Some pain with hip flexion on both sides in R side LBP Some pain with end range flexion on L knee  LOWER EXTREMITY MMT:  MMT Right eval Left eval  Hip flexion 4- 5  Hip extension    Hip abduction    Hip adduction    Hip internal rotation    Hip external rotation    Knee flexion 4+ 4-  Knee extension 4+ 4+  Ankle  dorsiflexion    Ankle plantarflexion    Ankle inversion    Ankle eversion     (Blank rows = not tested)   FUNCTIONAL TESTS:  5 times sit to stand: 24s; 10/14/23 26.8 seconds no UEs  Timed up and go (TUG): 22s; 10/14/23 17 seconds   GAIT: Distance walked: in clinic distances Assistive device utilized: None Level of assistance: Complete Independence and Modified independence Comments: antalgic gait, weight shifting on the RLE, taking quick steps with LLE to avoid using it and weight bearing  TREATMENT DATE:  [facility] Adult PT Treatment:                                                DATE: 11/05/23 Pt seen for aquatic therapy today.  Treatment took place in water  3.5-4.75 ft in depth at the [facility] pool. Temp of water  was 91.  Pt entered/exited the pool via lift (for safety Pt afraid of water ) with hand rail.  Exercises - Walking  - Standing 'L' Stretch at [employer]   - forward march with knee kick  - Heel Toe Raises at [facility]   - Kick Leg Out To The Side  - Standing March at [facility]   - Squat - Holding to pool wall bend knee towards buttocks   - Leg swings flex/ext     Pt requires the buoyancy and hydrostatic pressure of water  for support, and to offload joints by unweighting joint load by at least 50 % in navel deep water  and by at least 75-80% in chest to neck deep water .  Viscosity of the water  is needed for resistance of strengthening. Water  current perturbations provides challenge to standing balance requiring increased core activation.         PATIENT EDUCATION:  Education details: POC, HEP Person educated: Patient Education method: Explanation Education comprehension: verbalized understanding  HOME EXERCISE PROGRAM: Access Code: [email or web address] URL: [email or web address] Date: 09/16/2023 Prepared by: Almetta Fam  Exercises - Sit to Stand  - 1 x daily - 7 x weekly - 2 sets - 10 reps - Seated Long Arc Quad  - 1 x daily - 7 x weekly - 2 sets - 10 reps - 3 hold - Heel Raises with Counter Support  - 1 x daily - 7 x weekly - 2 sets - 10 reps - Supine Lower Trunk Rotation  - 1 x daily - 7 x weekly - 2 sets - 10 reps - Supine Bridge  - 1 x daily - 7 x weekly - 2 sets - 10 reps   Aquatic This aquatic home exercise program from [employer] utilizes pictures from land based exercises, but has been adapted prior to lamination and issuance.   Access Code: [email or web address] URL: [email or web address] Date: 11/08/2023 Prepared by: Frankie Noya Santarelli  Exercises - Walking  - 1 x daily - 1-3 x weekly - 1-2 sets - 10 reps - Standing 'L' Stretch at [employer]  - 1 x daily - 1-3 x weekly - 1-2 sets - 10 reps - forward march with knee kick  - 1 x daily - 1-3 x weekly - Heel Toe Raises at Pool Wall  - 1 x daily - 1-3 x weekly - 1-2 sets - 10 reps - Kick Leg Out To The Side  - 1 x daily - 1-3 x weekly - 1-2 sets - 10 reps - Standing March at [facility]  - 1 x daily - 1-3 x weekly - 1-2 sets - 10 reps - Squat  - 1 x daily - 1-3 x weekly - 1-2 sets - 10 reps - Holding to pool wall bend knee towards buttocks  - 1 x daily - 1-3 x weekly - 1-2 sets - 10 reps - Leg swings flex/ext  - 1 x daily - 1-3 x weekly - 1-2 sets - 10 reps ASSESSMENT:  CLINICAL IMPRESSION: Pt continues to report good response  from prior aquatic sessions . This is her final session.  She is instructed and demonstrates understanding and indep with aquatic HEP. It is issued, laminated.  She completes well without pain or fatigue.  Instruction on different water  aerobic classes that she may benefit. She is not yet sure which [employer] she will attend to complete, has access to all.  She has reached her max potential in setting.          Eval:Patient is a 68 y.o. female who was seen today for physical therapy evaluation and treatment for chronic  knee pain. She has pain on both sides but it is mostly the L knee that bothers her. Patient reports walking increases her pain and she is unable to do steps going up or down with the LLE. She compensates with her RLE when walking and due to this it has started causing pain in her R lower back. She has had shots in the L knee and it seemed to help for a little bit but she still has pain mostly with any standing and walking activities. Patient will benefit from PT to address her L knee pain as well as the back pain to increase her activity tolerance and improve strength to allow ease with ADLs.  OBJECTIVE IMPAIRMENTS: Abnormal gait, decreased activity tolerance, decreased balance, decreased endurance, difficulty walking, decreased strength, postural dysfunction, and pain.   ACTIVITY LIMITATIONS: squatting, stairs, and locomotion level  PARTICIPATION LIMITATIONS: meal prep, cleaning, laundry, driving, shopping, community activity, and yard work  PERSONAL FACTORS: Age, Fitness, Past/current experiences, and Time since onset of injury/illness/exacerbation are also affecting patient's functional outcome.   REHAB POTENTIAL: Good  CLINICAL DECISION MAKING: Stable/uncomplicated  EVALUATION COMPLEXITY: Low  GOALS: Goals reviewed with patient? Yes  SHORT TERM GOALS: Target date: 10/28/23  Patient will be independent with initial HEP. Baseline:  Goal status: MET 10/14/23  2.  Patient will decrease TUG <14s Baseline: 22s Goal status: IN PROGRESS 10/14/23   LONG TERM GOALS: Target date: 12/09/23  Patient will be independent with advanced/ongoing HEP to improve outcomes and carryover.  Baseline:  Goal status: IN PROGRESS 10/14/23;11/08/23 (Met in aquatics)  2.  Patient will report at least 75% improvement in  knee pain to improve QOL. (Pain <4/10) Baseline: 8/10 Goal status: IN PROGRESS 10/14/23  3.  Patient will demonstrate improved functional LE strength as demonstrated by 5xSTS <15s. Baseline:  24s Goal status: IN PROGRESS 10/14/23  4. Patient will be able to ascend/descend stairs with 1 HR and reciprocal step pattern safely to access home and community.  Baseline:  Goal status: IN PROGRESS 10/14/23  5.  Patient will tolerate at least 30 min of standing and walking. Baseline: 5-10 mins Goal status: ONGOING 10/14/23    PLAN:  PT FREQUENCY: 2x/week  PT DURATION: 12 weeks  PLANNED INTERVENTIONS: 97110-Therapeutic exercises, 97530- Therapeutic activity, 97112- Neuromuscular re-education, 97535- Self Care, 02859- Manual therapy, [phone]- Gait training, [phone]- Vasopneumatic device, [id number]- Ultrasound, [id number]- Ionotophoresis 4mg /ml Dexamethasone , 79439 (1-2 muscles), [id number] (3+ muscles)- Dry Needling, Patient/Family education, Balance training, Stair training, Taping, Joint mobilization, Joint manipulation, Spinal manipulation, Spinal mobilization, Cryotherapy, and Moist heat  PLAN FOR NEXT SESSION:   Land: progressive PREs, lumbar and hip mobility/flexibility, core; re-assess when she returns to land   Water : per aquatic PT   Ronal Kem) Wardner MPT 11/08/23 1:20 PM [facility] [address] [place], [place], [address] Phone: [phone]   Fax:  [phone]

## 2023-11-10 ENCOUNTER — Ambulatory Visit

## 2023-11-11 ENCOUNTER — Ambulatory Visit: Attending: Family Medicine | Admitting: Physical Therapy

## 2023-11-11 ENCOUNTER — Encounter: Payer: Self-pay | Admitting: Physical Therapy

## 2023-11-11 DIAGNOSIS — M25561 Pain in right knee: Secondary | ICD-10-CM | POA: Insufficient documentation

## 2023-11-11 DIAGNOSIS — M25562 Pain in left knee: Secondary | ICD-10-CM | POA: Diagnosis present

## 2023-11-11 DIAGNOSIS — G8929 Other chronic pain: Secondary | ICD-10-CM | POA: Diagnosis present

## 2023-11-11 DIAGNOSIS — M5459 Other low back pain: Secondary | ICD-10-CM | POA: Diagnosis present

## 2023-11-11 NOTE — Therapy (Signed)
 OUTPATIENT PHYSICAL THERAPY LOWER EXTREMITY PROGRESS NOTE    Patient Name: [name] MRN: [id number] DOB:[birth year], 68 y.o., female Today's Date: 11/11/2023   Progress Note Reporting Period 10/15/23 to 11/11/23  See note below for Objective Data and Assessment of Progress/Goals.      END OF SESSION:  PT End of Session - 11/11/23 1207     Visit Number 15    Date for Recertification  12/09/23    Authorization Type [employer]    Progress Note Due on Visit 25    PT Start Time 1148    PT Stop Time 1226    PT Time Calculation (min) 38 min    Activity Tolerance Patient tolerated treatment well    Behavior During Therapy WFL for tasks assessed/performed                  Past Medical History:  Diagnosis Date   Arthritis    Asthma    CHF (congestive heart failure) (HCC)    Coronary artery disease    Diabetes mellitus    Diabetic neuropathy (HCC)    Dysrhythmia    A.fib    takes Eliquis    GERD (gastroesophageal reflux disease)    History of hiatal hernia    History of kidney stones    Hx of cardiovascular stress test    Lexiscan  Myoview (10/15):  Medium area of ischemia in AL and IL distribution, cannot completely exclude shifting breast attenuation, EF 64%; Abnormal study   Hyperlipidemia    Hypertension    Mild carotid artery disease    Morbid obesity (HCC)    PAF (paroxysmal atrial fibrillation) (HCC)    Sleep apnea    does not wear CPAP   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   Syncope    Past Surgical History:  Procedure Laterality Date   ACHILLES TENDON REPAIR  1995   right   BIOPSY  07/22/2021   Procedure: BIOPSY;  Surgeon: Saintclair Jasper, MD;  Location: [facility];  Service: Gastroenterology;;   CATARACT EXTRACTION Bilateral    COLONOSCOPY     COLONOSCOPY WITH PROPOFOL  N/A 07/22/2021   Procedure: COLONOSCOPY WITH PROPOFOL ;  Surgeon: Saintclair Jasper, MD;  Location: [facility];  Service: Gastroenterology;  Laterality: N/A;   CYSTOSCOPY W/ RETROGRADES  Right 06/15/2023   Procedure: CYSTOSCOPY, WITH RETROGRADE PYELOGRAM;  Surgeon: Shane Steffan BROCKS, MD;  Location: [facility];  Service: Urology;  Laterality: Right;   CYSTOSCOPY/URETEROSCOPY/HOLMIUM LASER/STENT PLACEMENT Right 06/15/2023   Procedure: CYSTOSCOPY/URETEROSCOPY/HOLMIUM LASER/STENT PLACEMENT;  Surgeon: Shane Steffan BROCKS, MD;  Location: [facility];  Service: Urology;  Laterality: Right;   DILATION AND CURETTAGE OF UTERUS     ESOPHAGOGASTRODUODENOSCOPY (EGD) WITH PROPOFOL  N/A 07/22/2021   Procedure: ESOPHAGOGASTRODUODENOSCOPY (EGD) WITH PROPOFOL ;  Surgeon: Saintclair Jasper, MD;  Location: [facility];  Service: Gastroenterology;  Laterality: N/A;   EYE SURGERY Left    detached retina repair   HAND SURGERY Right    right  pinky finger   ORIF   heart stent  2007   PARS PLANA VITRECTOMY Left 03/24/2017   Procedure: PARS PLANA VITRECTOMY WITH 25 GAUGE;  Surgeon: Elner Arley LABOR, MD;  Location: [facility];  Service: Ophthalmology;  Laterality: Left;   POLYPECTOMY  07/22/2021   Procedure: POLYPECTOMY;  Surgeon: Saintclair Jasper, MD;  Location: [facility];  Service: Gastroenterology;;   UTERINE FIBROID SURGERY  2008   Patient Active Problem List   Diagnosis Date Noted   Paroxysmal atrial fibrillation with RVR (HCC) 11/28/2020   Hypertensive urgency 11/28/2020  Syncope and collapse 11/28/2020   Nausea 11/28/2020   Hypokalemia 11/28/2020   Hyperglycemia due to diabetes mellitus (HCC) 11/28/2020   GERD (gastroesophageal reflux disease) 11/28/2020   Asthma 11/28/2020   Coronary atherosclerosis of native coronary artery 07/17/2013   Mixed hyperlipidemia 07/17/2013   Essential hypertension, benign 07/17/2013   Obesity 08/11/2011    PCP: Alberta Sharps  REFERRING PROVIDER: Marcey Her  REFERRING DIAG: Pain in L knee  THERAPY DIAG:  Chronic pain of left knee  Bilateral chronic knee pain  Other low back pain  Rationale for Evaluation and Treatment: Rehabilitation  ONSET DATE:  chronic  SUBJECTIVE:   SUBJECTIVE STATEMENT:  Left knee is hurting a little this morning, right knee is not as bad. Really enjoyed working in the water  with Matilda. She taught me how to do steps correctly. She told me to try a deep tissue massage too. Walking is still hard, knee still cracks, try to get up and at least stand at home every so often    EVAL: My knees hurt, the left one more. It is to the point where I can't step up or down on the L side. It has started making the back on the R side hurt now. They put a shot in it in June, it doesn't hurt like it did but I still don't have use of it.   PERTINENT HISTORY: Arthritis, CHF, DM, CAD, PAF, Stroke in 2022  PAIN:  Are you having pain? Yes: NPRS scale: 4/10 Pain location: L knee  Pain description: just hurts  Aggravating factors: walking, sitting or laying for too long and then trying to get up, depends on position, sometimes has no rhythm to it  Relieving factors: finding the right position but making sure I still move enough   PRECAUTIONS: None  RED FLAGS: None   WEIGHT BEARING RESTRICTIONS: No  FALLS:  Has patient fallen in last 6 months? No  LIVING ENVIRONMENT: Lives with: lives alone Lives in: House/apartment Stairs: Yes: Internal: steps into the basement steps; on left going up and External: 2 steps; on left going up   OCCUPATION: retired from social services   PLOF: Independent and Independent with basic ADLs  PATIENT GOALS:  to get rid of the pain and be able to walk and move again   NEXT MD VISIT:   OBJECTIVE:  Note: Objective measures were completed at Evaluation unless otherwise noted.  DIAGNOSTIC FINDINGS:   COGNITION: Overall cognitive status: Within functional limits for tasks assessed     SENSATION: WFL   MUSCLE LENGTH: Hamstrings: some tightness in BLE  POSTURE: rounded shoulders  LOWER EXTREMITY ROM: WFL Some pain with hip flexion on both sides in R side LBP Some pain with end  range flexion on L knee  LOWER EXTREMITY MMT:  MMT Right eval Left eval 10/2 R  10/2 L   Hip flexion 4- 5 4+ 4+  Hip extension      Hip abduction      Hip adduction      Hip internal rotation      Hip external rotation      Knee flexion 4+ 4- 5 4+ pain   Knee extension 4+ 4+ 5 4- pain   Ankle dorsiflexion      Ankle plantarflexion      Ankle inversion      Ankle eversion       (Blank rows = not tested)   FUNCTIONAL TESTS:  5 times sit to stand: 24s; 10/14/23 26.8 seconds  no UEs; 10/225- 19.8 seconds no UEs   Timed up and go (TUG): 22s; 10/14/23 17 seconds; 11/11/23 16.8 seconds no device    GAIT: Distance walked: in clinic distances Assistive device utilized: None Level of assistance: Complete Independence and Modified independence Comments: antalgic gait, weight shifting on the RLE, taking quick steps with LLE to avoid using it and weight bearing                                                                                                                                 TREATMENT DATE:  [facility] Adult PT Treatment:                                                DATE:    11/11/23  TUG, MMT, 5xSTS, stair assessment, goals, POC and education on all   Nustep L2x6 minutes BLEs only seat 8  Bridges x10 Seated SLRs x10 B 0# STS + yellow TB around knees x10       11/05/23 Pt seen for aquatic therapy today.  Treatment took place in water  3.5-4.75 ft in depth at the [facility] pool. Temp of water  was 91.  Pt entered/exited the pool via lift (for safety Pt afraid of water ) with hand rail.  Exercises - Walking  - Standing 'L' Stretch at [employer]   - forward march with knee kick  - Heel Toe Raises at [facility]   - Kick Leg Out To The Side  - Standing March at [facility]   - Squat - Holding to pool wall bend knee towards buttocks   - Leg swings flex/ext     Pt requires the buoyancy and hydrostatic pressure of water  for support, and to offload joints by  unweighting joint load by at least 50 % in navel deep water  and by at least 75-80% in chest to neck deep water .  Viscosity of the water  is needed for resistance of strengthening. Water  current perturbations provides challenge to standing balance requiring increased core activation.         PATIENT EDUCATION:  Education details: POC, HEP Person educated: Patient Education method: Explanation Education comprehension: verbalized understanding  HOME EXERCISE PROGRAM: Access Code: [email or web address] URL: [email or web address] Date: 09/16/2023 Prepared by: Almetta Fam  Exercises - Sit to Stand  - 1 x daily - 7 x weekly - 2 sets - 10 reps - Seated Long Arc Quad  - 1 x daily - 7 x weekly - 2 sets - 10 reps - 3 hold - Heel Raises with Counter Support  - 1 x daily - 7 x weekly - 2 sets - 10 reps - Supine Lower Trunk Rotation  - 1 x daily - 7 x weekly - 2 sets - 10 reps - Supine Bridge  - 1 x daily -  7 x weekly - 2 sets - 10 reps   Aquatic This aquatic home exercise program from Med[employer] utilizes pictures from land based exercises, but has been adapted prior to lamination and issuance.   Access Code: [email or web address] URL: [email or web address] Date: 11/08/2023 Prepared by: Frankie Ziemba  Exercises - Walking  - 1 x daily - 1-3 x weekly - 1-2 sets - 10 reps - Standing 'L' Stretch at [employer]  - 1 x daily - 1-3 x weekly - 1-2 sets - 10 reps - forward march with knee kick  - 1 x daily - 1-3 x weekly - Heel Toe Raises at Pool Wall  - 1 x daily - 1-3 x weekly - 1-2 sets - 10 reps - Kick Leg Out To The Side  - 1 x daily - 1-3 x weekly - 1-2 sets - 10 reps - Standing March at [facility]  - 1 x daily - 1-3 x weekly - 1-2 sets - 10 reps - Squat  - 1 x daily - 1-3 x weekly - 1-2 sets - 10 reps - Holding to pool wall bend knee towards buttocks  - 1 x daily - 1-3 x weekly - 1-2 sets - 10 reps - Leg swings flex/ext  - 1 x daily - 1-3 x weekly - 1-2 sets - 10  reps ASSESSMENT:  CLINICAL IMPRESSION:    Arrives today after having completed course of water  therapy- focused on updated progress note to assess status for return to land. Has gained some strength and shows some improvements in functional mobility and balance. Still having quite a lot of knee pain and noted some knee hyperextension with gait and on stairs. Will continue with land focused skilled PT services and re-assess objective and functional status at the end of the month/cert period.        Eval:Patient is a 68 y.o. female who was seen today for physical therapy evaluation and treatment for chronic knee pain. She has pain on both sides but it is mostly the L knee that bothers her. Patient reports walking increases her pain and she is unable to do steps going up or down with the LLE. She compensates with her RLE when walking and due to this it has started causing pain in her R lower back. She has had shots in the L knee and it seemed to help for a little bit but she still has pain mostly with any standing and walking activities. Patient will benefit from PT to address her L knee pain as well as the back pain to increase her activity tolerance and improve strength to allow ease with ADLs.  OBJECTIVE IMPAIRMENTS: Abnormal gait, decreased activity tolerance, decreased balance, decreased endurance, difficulty walking, decreased strength, postural dysfunction, and pain.   ACTIVITY LIMITATIONS: squatting, stairs, and locomotion level  PARTICIPATION LIMITATIONS: meal prep, cleaning, laundry, driving, shopping, community activity, and yard work  PERSONAL FACTORS: Age, Fitness, Past/current experiences, and Time since onset of injury/illness/exacerbation are also affecting patient's functional outcome.   REHAB POTENTIAL: Good  CLINICAL DECISION MAKING: Stable/uncomplicated  EVALUATION COMPLEXITY: Low  GOALS: Goals reviewed with patient? Yes  SHORT TERM GOALS: Target date:  10/28/23  Patient will be independent with initial HEP. Baseline:  Goal status: MET 10/14/23  2.  Patient will decrease TUG <14s Baseline: 22s Goal status: IN PROGRESS 11/11/23   LONG TERM GOALS: Target date: 12/09/23  Patient will be independent with advanced/ongoing HEP to improve outcomes and carryover.  Baseline:  Goal status: IN PROGRESS  10/14/23;11/08/23 (Met in aquatics)  2.  Patient will report at least 75% improvement in  knee pain to improve QOL. (Pain <4/10) Baseline: 8/10 Goal status: IN PROGRESS 11/11/23 50% per pt   3.  Patient will demonstrate improved functional LE strength as demonstrated by 5xSTS <15s. Baseline: 24s Goal status: IN PROGRESS 11/11/23  4. Patient will be able to ascend/descend stairs with 1 HR and reciprocal step pattern safely to access home and community.  Baseline:  Goal status: IN PROGRESS 11/11/23  5.  Patient will tolerate at least 30 min of standing and walking. Baseline: 5-10 mins Goal status: IN PROGRESS 11/11/23 maybe I could do 20     PLAN:  PT FREQUENCY: 2x/week  PT DURATION: 12 weeks  PLANNED INTERVENTIONS: 97110-Therapeutic exercises, 97530- Therapeutic activity, 97112- Neuromuscular re-education, 97535- Self Care, [id number]- Manual therapy, [phone]- Gait training, [phone]- Vasopneumatic device, [id number]- Ultrasound, [id number]- Ionotophoresis 4mg /ml Dexamethasone , [id number] (1-2 muscles), [id number] (3+ muscles)- Dry Needling, Patient/Family education, Balance training, Stair training, Taping, Joint mobilization, Joint manipulation, Spinal manipulation, Spinal mobilization, Cryotherapy, and Moist heat  PLAN FOR NEXT SESSION:   Progressive PREs, lumbar and hip mobility/flexibility, core, re-assess at end of month. Needs land HEP update   Josette Rough, PT, DPT 11/11/23 12:29 PM

## 2023-11-16 ENCOUNTER — Ambulatory Visit: Admitting: Physical Therapy

## 2023-11-16 DIAGNOSIS — M25562 Pain in left knee: Secondary | ICD-10-CM | POA: Diagnosis not present

## 2023-11-16 DIAGNOSIS — M5459 Other low back pain: Secondary | ICD-10-CM

## 2023-11-16 DIAGNOSIS — G8929 Other chronic pain: Secondary | ICD-10-CM

## 2023-11-16 NOTE — Therapy (Signed)
 OUTPATIENT PHYSICAL THERAPY LOWER EXTREMITY PROGRESS NOTE    Patient Name: [name] MRN: [id number] DOB:[birth year], 68 y.o., female Today's Date: 11/16/2023     END OF SESSION:  PT End of Session - 11/16/23 1151     Visit Number 16    Date for Recertification  12/09/23    Authorization Type [employer]    PT Start Time 1145    PT Stop Time 1230    PT Time Calculation (min) 45 min                  Past Medical History:  Diagnosis Date   Arthritis    Asthma    CHF (congestive heart failure) (HCC)    Coronary artery disease    Diabetes mellitus    Diabetic neuropathy (HCC)    Dysrhythmia    A.fib    takes Eliquis    GERD (gastroesophageal reflux disease)    History of hiatal hernia    History of kidney stones    Hx of cardiovascular stress test    Lexiscan  Myoview (10/15):  Medium area of ischemia in AL and IL distribution, cannot completely exclude shifting breast attenuation, EF 64%; Abnormal study   Hyperlipidemia    Hypertension    Mild carotid artery disease    Morbid obesity (HCC)    PAF (paroxysmal atrial fibrillation) (HCC)    Sleep apnea    does not wear CPAP   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   Syncope    Past Surgical History:  Procedure Laterality Date   ACHILLES TENDON REPAIR  1995   right   BIOPSY  07/22/2021   Procedure: BIOPSY;  Surgeon: Saintclair Jasper, MD;  Location: [facility];  Service: Gastroenterology;;   CATARACT EXTRACTION Bilateral    COLONOSCOPY     COLONOSCOPY WITH PROPOFOL  N/A 07/22/2021   Procedure: COLONOSCOPY WITH PROPOFOL ;  Surgeon: Saintclair Jasper, MD;  Location: [facility];  Service: Gastroenterology;  Laterality: N/A;   CYSTOSCOPY W/ RETROGRADES Right 06/15/2023   Procedure: CYSTOSCOPY, WITH RETROGRADE PYELOGRAM;  Surgeon: Shane Steffan BROCKS, MD;  Location: [facility];  Service: Urology;  Laterality: Right;   CYSTOSCOPY/URETEROSCOPY/HOLMIUM LASER/STENT PLACEMENT Right 06/15/2023   Procedure: CYSTOSCOPY/URETEROSCOPY/HOLMIUM  LASER/STENT PLACEMENT;  Surgeon: Shane Steffan BROCKS, MD;  Location: [facility];  Service: Urology;  Laterality: Right;   DILATION AND CURETTAGE OF UTERUS     ESOPHAGOGASTRODUODENOSCOPY (EGD) WITH PROPOFOL  N/A 07/22/2021   Procedure: ESOPHAGOGASTRODUODENOSCOPY (EGD) WITH PROPOFOL ;  Surgeon: Saintclair Jasper, MD;  Location: [facility];  Service: Gastroenterology;  Laterality: N/A;   EYE SURGERY Left    detached retina repair   HAND SURGERY Right    right  pinky finger   ORIF   heart stent  2007   PARS PLANA VITRECTOMY Left 03/24/2017   Procedure: PARS PLANA VITRECTOMY WITH 25 GAUGE;  Surgeon: Elner Arley LABOR, MD;  Location: [facility];  Service: Ophthalmology;  Laterality: Left;   POLYPECTOMY  07/22/2021   Procedure: POLYPECTOMY;  Surgeon: Saintclair Jasper, MD;  Location: [facility];  Service: Gastroenterology;;   UTERINE FIBROID SURGERY  2008   Patient Active Problem List   Diagnosis Date Noted   Paroxysmal atrial fibrillation with RVR (HCC) 11/28/2020   Hypertensive urgency 11/28/2020   Syncope and collapse 11/28/2020   Nausea 11/28/2020   Hypokalemia 11/28/2020   Hyperglycemia due to diabetes mellitus (HCC) 11/28/2020   GERD (gastroesophageal reflux disease) 11/28/2020   Asthma 11/28/2020   Coronary atherosclerosis of native coronary artery 07/17/2013   Mixed hyperlipidemia 07/17/2013  Essential hypertension, benign 07/17/2013   Obesity 08/11/2011    PCP: Alberta Sharps  REFERRING PROVIDER: Marcey Her  REFERRING DIAG: Pain in L knee  THERAPY DIAG:  Chronic pain of left knee  Bilateral chronic knee pain  Other low back pain  Rationale for Evaluation and Treatment: Rehabilitation  ONSET DATE: chronic  SUBJECTIVE:   SUBJECTIVE STATEMENT:  Walked around [place] this weekend and it was challenging  EVAL: My knees hurt, the left one more. It is to the point where I can't step up or down on the L side. It has started making the back on the R side hurt now. They put a shot in it in  June, it doesn't hurt like it did but I still don't have use of it.   PERTINENT HISTORY: Arthritis, CHF, DM, CAD, PAF, Stroke in 2022  PAIN:  Are you having pain? Yes: NPRS scale: 5/10 Pain location: L knee  Pain description: just hurts  Aggravating factors: walking, sitting or laying for too long and then trying to get up, depends on position, sometimes has no rhythm to it  Relieving factors: finding the right position but making sure I still move enough   PRECAUTIONS: None  RED FLAGS: None   WEIGHT BEARING RESTRICTIONS: No  FALLS:  Has patient fallen in last 6 months? No  LIVING ENVIRONMENT: Lives with: lives alone Lives in: House/apartment Stairs: Yes: Internal: steps into the basement steps; on left going up and External: 2 steps; on left going up   OCCUPATION: retired from social services   PLOF: Independent and Independent with basic ADLs  PATIENT GOALS:  to get rid of the pain and be able to walk and move again   NEXT MD VISIT:   OBJECTIVE:  Note: Objective measures were completed at Evaluation unless otherwise noted.  DIAGNOSTIC FINDINGS:   COGNITION: Overall cognitive status: Within functional limits for tasks assessed     SENSATION: WFL   MUSCLE LENGTH: Hamstrings: some tightness in BLE  POSTURE: rounded shoulders  LOWER EXTREMITY ROM: WFL Some pain with hip flexion on both sides in R side LBP Some pain with end range flexion on L knee  LOWER EXTREMITY MMT:  MMT Right eval Left eval 10/2 R  10/2 L   Hip flexion 4- 5 4+ 4+  Hip extension      Hip abduction      Hip adduction      Hip internal rotation      Hip external rotation      Knee flexion 4+ 4- 5 4+ pain   Knee extension 4+ 4+ 5 4- pain   Ankle dorsiflexion      Ankle plantarflexion      Ankle inversion      Ankle eversion       (Blank rows = not tested)   FUNCTIONAL TESTS:  5 times sit to stand: 24s; 10/14/23 26.8 seconds no UEs; 10/225- 19.8 seconds no UEs   Timed up and  go (TUG): 22s; 10/14/23 17 seconds; 11/11/23 16.8 seconds no device    GAIT: Distance walked: in clinic distances Assistive device utilized: None Level of assistance: Complete Independence and Modified independence Comments: antalgic gait, weight shifting on the RLE, taking quick steps with LLE to avoid using it and weight bearing  TREATMENT DATE:  [facility] Adult PT Treatment:                                                DATE:   11/16/23 3# LAQ 2 sets 10 3# hip flex and abd seated 2 sets 10 Green tband HS curl 2 sets 10 STS 10 x with yellow wt ball Nustep L 5 6 min 20# resisted giat 5 x fwd and backward, 23 x each side   11/11/23  TUG, MMT, 5xSTS, stair assessment, goals, POC and education on all   Nustep L2x6 minutes BLEs only seat 8  Bridges x10 Seated SLRs x10 B 0# STS + yellow TB around knees x10       11/05/23 Pt seen for aquatic therapy today.  Treatment took place in water  3.5-4.75 ft in depth at the [facility] pool. Temp of water  was 91.  Pt entered/exited the pool via lift (for safety Pt afraid of water ) with hand rail.  Exercises - Walking  - Standing 'L' Stretch at [employer]   - forward march with knee kick  - Heel Toe Raises at [facility]   - Kick Leg Out To The Side  - Standing March at [facility]   - Squat - Holding to pool wall bend knee towards buttocks   - Leg swings flex/ext     Pt requires the buoyancy and hydrostatic pressure of water  for support, and to offload joints by unweighting joint load by at least 50 % in navel deep water  and by at least 75-80% in chest to neck deep water .  Viscosity of the water  is needed for resistance of strengthening. Water  current perturbations provides challenge to standing balance requiring increased core activation.         PATIENT EDUCATION:  Education details: POC, HEP Person  educated: Patient Education method: Explanation Education comprehension: verbalized understanding  HOME EXERCISE PROGRAM: Access Code: [email or web address] URL: [email or web address] Date: 09/16/2023 Prepared by: Almetta Fam  Exercises - Sit to Stand  - 1 x daily - 7 x weekly - 2 sets - 10 reps - Seated Long Arc Quad  - 1 x daily - 7 x weekly - 2 sets - 10 reps - 3 hold - Heel Raises with Counter Support  - 1 x daily - 7 x weekly - 2 sets - 10 reps - Supine Lower Trunk Rotation  - 1 x daily - 7 x weekly - 2 sets - 10 reps - Supine Bridge  - 1 x daily - 7 x weekly - 2 sets - 10 reps   Aquatic This aquatic home exercise program from MedBridge utilizes pictures from land based exercises, but has been adapted prior to lamination and issuance.   Access Code: [email or web address] URL: [email or web address] Date: 11/08/2023 Prepared by: Frankie Ziemba  Exercises - Walking  - 1 x daily - 1-3 x weekly - 1-2 sets - 10 reps - Standing 'L' Stretch at [employer]  - 1 x daily - 1-3 x weekly - 1-2 sets - 10 reps - forward march with knee kick  - 1 x daily - 1-3 x weekly - Heel Toe Raises at Pool Wall  - 1 x daily - 1-3 x weekly - 1-2 sets - 10 reps - Kick Leg Out To The Side  - 1 x daily - 1-3 x weekly - 1-2 sets -  10 reps - Standing March at [facility]  - 1 x daily - 1-3 x weekly - 1-2 sets - 10 reps - Squat  - 1 x daily - 1-3 x weekly - 1-2 sets - 10 reps - Holding to pool wall bend knee towards buttocks  - 1 x daily - 1-3 x weekly - 1-2 sets - 10 reps - Leg swings flex/ext  - 1 x daily - 1-3 x weekly - 1-2 sets - 10 reps ASSESSMENT:  CLINICAL IMPRESSION:  pnt arrived on [facility], some increased pain after walking at [facility] with sister.  Progressed land ex for strength in wt bearing and non wt bearing and overall tolerated well with cuing and seated rest breaks.    Arrives today after having completed course of water  therapy- focused on updated progress note to assess status for  return to land. Has gained some strength and shows some improvements in functional mobility and balance. Still having quite a lot of knee pain and noted some knee hyperextension with gait and on stairs. Will continue with land focused skilled PT services and re-assess objective and functional status at the end of the month/cert period.        Eval:Patient is a 68 y.o. female who was seen today for physical therapy evaluation and treatment for chronic knee pain. She has pain on both sides but it is mostly the L knee that bothers her. Patient reports walking increases her pain and she is unable to do steps going up or down with the LLE. She compensates with her RLE when walking and due to this it has started causing pain in her R lower back. She has had shots in the L knee and it seemed to help for a little bit but she still has pain mostly with any standing and walking activities. Patient will benefit from PT to address her L knee pain as well as the back pain to increase her activity tolerance and improve strength to allow ease with ADLs.  OBJECTIVE IMPAIRMENTS: Abnormal gait, decreased activity tolerance, decreased balance, decreased endurance, difficulty walking, decreased strength, postural dysfunction, and pain.   ACTIVITY LIMITATIONS: squatting, stairs, and locomotion level  PARTICIPATION LIMITATIONS: meal prep, cleaning, laundry, driving, shopping, community activity, and yard work  PERSONAL FACTORS: Age, Fitness, Past/current experiences, and Time since onset of injury/illness/exacerbation are also affecting patient's functional outcome.   REHAB POTENTIAL: Good  CLINICAL DECISION MAKING: Stable/uncomplicated  EVALUATION COMPLEXITY: Low  GOALS: Goals reviewed with patient? Yes  SHORT TERM GOALS: Target date: 10/28/23  Patient will be independent with initial HEP. Baseline:  Goal status: MET 10/14/23  2.  Patient will decrease TUG <14s Baseline: 22s Goal status: IN PROGRESS  11/11/23   LONG TERM GOALS: Target date: 12/09/23  Patient will be independent with advanced/ongoing HEP to improve outcomes and carryover.  Baseline:  Goal status: IN PROGRESS 10/14/23;11/08/23 (Met in aquatics)  2.  Patient will report at least 75% improvement in  knee pain to improve QOL. (Pain <4/10) Baseline: 8/10 Goal status: IN PROGRESS 11/11/23 50% per pt   3.  Patient will demonstrate improved functional LE strength as demonstrated by 5xSTS <15s. Baseline: 24s Goal status: IN PROGRESS 11/11/23  4. Patient will be able to ascend/descend stairs with 1 HR and reciprocal step pattern safely to access home and community.  Baseline:  Goal status: IN PROGRESS 11/11/23  5.  Patient will tolerate at least 30 min of standing and walking. Baseline: 5-10 mins Goal status: IN PROGRESS  11/11/23 maybe I could do 20     PLAN:  PT FREQUENCY: 2x/week  PT DURATION: 12 weeks  PLANNED INTERVENTIONS: 97110-Therapeutic exercises, 97530- Therapeutic activity, 97112- Neuromuscular re-education, 97535- Self Care, [id number]- Manual therapy, [phone]- Gait training, [phone]- Vasopneumatic device, [id number]- Ultrasound, [id number]- Ionotophoresis 4mg /ml Dexamethasone , 79439 (1-2 muscles), [id number] (3+ muscles)- Dry Needling, Patient/Family education, Balance training, Stair training, Taping, Joint mobilization, Joint manipulation, Spinal manipulation, Spinal mobilization, Cryotherapy, and Moist heat  PLAN FOR NEXT SESSION:   Progressive PREs, lumbar and hip mobility/flexibility, core, re-assess at end of month. Needs land HEP update   Angie Laterrance Nauta PTA 11/16/23 11:52 AM

## 2023-11-18 ENCOUNTER — Ambulatory Visit

## 2023-11-18 DIAGNOSIS — M25562 Pain in left knee: Secondary | ICD-10-CM | POA: Diagnosis not present

## 2023-11-18 DIAGNOSIS — M5459 Other low back pain: Secondary | ICD-10-CM

## 2023-11-18 DIAGNOSIS — G8929 Other chronic pain: Secondary | ICD-10-CM

## 2023-11-18 NOTE — Therapy (Addendum)
 OUTPATIENT PHYSICAL THERAPY LOWER EXTREMITY TREATMENT NOTE    Patient Name: [name] MRN: [id number] DOB:December 22, 1955, 68 y.o., female Today's Date: 11/18/2023     END OF SESSION:  PT End of Session - 11/18/23 0804     Visit Number 17    Date for Recertification  12/09/23    Authorization Type [employer]    PT Start Time 0804    PT Stop Time 0845    PT Time Calculation (min) 41 min    Activity Tolerance Patient tolerated treatment well    Behavior During Therapy WFL for tasks assessed/performed                   Past Medical History:  Diagnosis Date   Arthritis    Asthma    CHF (congestive heart failure) (HCC)    Coronary artery disease    Diabetes mellitus    Diabetic neuropathy (HCC)    Dysrhythmia    A.fib    takes Eliquis    GERD (gastroesophageal reflux disease)    History of hiatal hernia    History of kidney stones    Hx of cardiovascular stress test    Lexiscan  Myoview (10/15):  Medium area of ischemia in AL and IL distribution, cannot completely exclude shifting breast attenuation, EF 64%; Abnormal study   Hyperlipidemia    Hypertension    Mild carotid artery disease    Morbid obesity (HCC)    PAF (paroxysmal atrial fibrillation) (HCC)    Sleep apnea    does not wear CPAP   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   Syncope    Past Surgical History:  Procedure Laterality Date   ACHILLES TENDON REPAIR  1995   right   BIOPSY  07/22/2021   Procedure: BIOPSY;  Surgeon: Saintclair Jasper, MD;  Location: [facility];  Service: Gastroenterology;;   CATARACT EXTRACTION Bilateral    COLONOSCOPY     COLONOSCOPY WITH PROPOFOL  N/A 07/22/2021   Procedure: COLONOSCOPY WITH PROPOFOL ;  Surgeon: Saintclair Jasper, MD;  Location: [facility];  Service: Gastroenterology;  Laterality: N/A;   CYSTOSCOPY W/ RETROGRADES Right 06/15/2023   Procedure: CYSTOSCOPY, WITH RETROGRADE PYELOGRAM;  Surgeon: Shane Steffan BROCKS, MD;  Location: [facility];  Service: Urology;  Laterality: Right;    CYSTOSCOPY/URETEROSCOPY/HOLMIUM LASER/STENT PLACEMENT Right 06/15/2023   Procedure: CYSTOSCOPY/URETEROSCOPY/HOLMIUM LASER/STENT PLACEMENT;  Surgeon: Shane Steffan BROCKS, MD;  Location: [facility];  Service: Urology;  Laterality: Right;   DILATION AND CURETTAGE OF UTERUS     ESOPHAGOGASTRODUODENOSCOPY (EGD) WITH PROPOFOL  N/A 07/22/2021   Procedure: ESOPHAGOGASTRODUODENOSCOPY (EGD) WITH PROPOFOL ;  Surgeon: Saintclair Jasper, MD;  Location: [facility];  Service: Gastroenterology;  Laterality: N/A;   EYE SURGERY Left    detached retina repair   HAND SURGERY Right    right  pinky finger   ORIF   heart stent  2007   PARS PLANA VITRECTOMY Left 03/24/2017   Procedure: PARS PLANA VITRECTOMY WITH 25 GAUGE;  Surgeon: Elner Arley LABOR, MD;  Location: [facility];  Service: Ophthalmology;  Laterality: Left;   POLYPECTOMY  07/22/2021   Procedure: POLYPECTOMY;  Surgeon: Saintclair Jasper, MD;  Location: [facility];  Service: Gastroenterology;;   UTERINE FIBROID SURGERY  2008   Patient Active Problem List   Diagnosis Date Noted   Paroxysmal atrial fibrillation with RVR (HCC) 11/28/2020   Hypertensive urgency 11/28/2020   Syncope and collapse 11/28/2020   Nausea 11/28/2020   Hypokalemia 11/28/2020   Hyperglycemia due to diabetes mellitus (HCC) 11/28/2020   GERD (gastroesophageal reflux disease) 11/28/2020  Asthma 11/28/2020   Coronary atherosclerosis of native coronary artery 07/17/2013   Mixed hyperlipidemia 07/17/2013   Essential hypertension, benign 07/17/2013   Obesity 08/11/2011    PCP: Alberta Sharps  REFERRING PROVIDER: Marcey Her  REFERRING DIAG: Pain in L knee  THERAPY DIAG:  Chronic pain of left knee  Bilateral chronic knee pain  Other low back pain  Rationale for Evaluation and Treatment: Rehabilitation  ONSET DATE: chronic  SUBJECTIVE:   SUBJECTIVE STATEMENT:  I have some muscle soreness from previous visit. Some LBP currently in one location (5/10 pain)  EVAL: My knees hurt, the  left one more. It is to the point where I can't step up or down on the L side. It has started making the back on the R side hurt now. They put a shot in it in June, it doesn't hurt like it did but I still don't have use of it.   PERTINENT HISTORY: Arthritis, CHF, DM, CAD, PAF, Stroke in 2022  PAIN:  Are you having pain? Yes: NPRS scale: 5/10 Pain location: L knee  Pain description: just hurts  Aggravating factors: walking, sitting or laying for too long and then trying to get up, depends on position, sometimes has no rhythm to it  Relieving factors: finding the right position but making sure I still move enough   PRECAUTIONS: None  RED FLAGS: None   WEIGHT BEARING RESTRICTIONS: No  FALLS:  Has patient fallen in last 6 months? No  LIVING ENVIRONMENT: Lives with: lives alone Lives in: House/apartment Stairs: Yes: Internal: steps into the basement steps; on left going up and External: 2 steps; on left going up   OCCUPATION: retired from social services   PLOF: Independent and Independent with basic ADLs  PATIENT GOALS:  to get rid of the pain and be able to walk and move again   NEXT MD VISIT:   OBJECTIVE:  Note: Objective measures were completed at Evaluation unless otherwise noted.  DIAGNOSTIC FINDINGS:   COGNITION: Overall cognitive status: Within functional limits for tasks assessed     SENSATION: WFL   MUSCLE LENGTH: Hamstrings: some tightness in BLE  POSTURE: rounded shoulders  LOWER EXTREMITY ROM: WFL Some pain with hip flexion on both sides in R side LBP Some pain with end range flexion on L knee  LOWER EXTREMITY MMT:  MMT Right eval Left eval 10/2 R  10/2 L   Hip flexion 4- 5 4+ 4+  Hip extension      Hip abduction      Hip adduction      Hip internal rotation      Hip external rotation      Knee flexion 4+ 4- 5 4+ pain   Knee extension 4+ 4+ 5 4- pain   Ankle dorsiflexion      Ankle plantarflexion      Ankle inversion      Ankle  eversion       (Blank rows = not tested)   FUNCTIONAL TESTS:  5 times sit to stand: 24s; 10/14/23 26.8 seconds no UEs; 11/11/23- 19.8 seconds no UEs   Timed up and go (TUG): 22s; 10/14/23 17 seconds; 11/11/23 16.8 seconds no device    GAIT: Distance walked: in clinic distances Assistive device utilized: None Level of assistance: Complete Independence and Modified independence Comments: antalgic gait, weight shifting on the RLE, taking quick steps with LLE to avoid using it and weight bearing  TREATMENT DATE:   11/18/23: Supine PPT x 10 with 5 second holds Supine Pball HS Curls 2 x 10 Seated Pball Abdominals 2 x 10 with 3 second holds Supine Pball Trunk Rotation x 10 each way Standing resisted Hip flx, abd, and ext in // bars with yellow tband 3 x 5 each way for SL balance Nustep Lvl 5 for 6 min  11/16/23 3# LAQ 2 sets 10 3# hip flex and abd seated 2 sets 10 Green tband HS curl 2 sets 10 STS 10 x with yellow wt ball Nustep L 5 6 min 20# resisted giat 5 x fwd and backward, 23 x each side   11/11/23  TUG, MMT, 5xSTS, stair assessment, goals, POC and education on all   Nustep L2x6 minutes BLEs only seat 8  Bridges x10 Seated SLRs x10 B 0# STS + yellow TB around knees x10   11/05/23 Pt seen for aquatic therapy today.  Treatment took place in water  3.5-4.75 ft in depth at the [facility] pool. Temp of water  was 91.  Pt entered/exited the pool via lift (for safety Pt afraid of water ) with hand rail.  Exercises - Walking  - Standing 'L' Stretch at [employer]   - forward march with knee kick  - Heel Toe Raises at [facility]   - Kick Leg Out To The Side  - Standing March at [facility]   - Squat - Holding to pool wall bend knee towards buttocks   - Leg swings flex/ext     Pt requires the buoyancy and hydrostatic pressure of water  for support,  and to offload joints by unweighting joint load by at least 50 % in navel deep water  and by at least 75-80% in chest to neck deep water .  Viscosity of the water  is needed for resistance of strengthening. Water  current perturbations provides challenge to standing balance requiring increased core activation.     PATIENT EDUCATION:  Education details: POC, HEP Person educated: Patient Education method: Explanation Education comprehension: verbalized understanding  HOME EXERCISE PROGRAM: Access Code: [email or web address] URL: [email or web address] Date: 09/16/2023 Prepared by: Almetta Fam  Exercises - Sit to Stand  - 1 x daily - 7 x weekly - 2 sets - 10 reps - Seated Long Arc Quad  - 1 x daily - 7 x weekly - 2 sets - 10 reps - 3 hold - Heel Raises with Counter Support  - 1 x daily - 7 x weekly - 2 sets - 10 reps - Supine Lower Trunk Rotation  - 1 x daily - 7 x weekly - 2 sets - 10 reps - Supine Bridge  - 1 x daily - 7 x weekly - 2 sets - 10 reps   Aquatic This aquatic home exercise program from Med[employer] utilizes pictures from land based exercises, but has been adapted prior to lamination and issuance.   Access Code: [email or web address] URL: [email or web address] Date: 11/08/2023 Prepared by: Frankie Ziemba  Exercises - Walking  - 1 x daily - 1-3 x weekly - 1-2 sets - 10 reps - Standing 'L' Stretch at [employer]  - 1 x daily - 1-3 x weekly - 1-2 sets - 10 reps - forward march with knee kick  - 1 x daily - 1-3 x weekly - Heel Toe Raises at Pool Wall  - 1 x daily - 1-3 x weekly - 1-2 sets - 10 reps - Kick Leg Out To The Side  - 1 x daily - 1-3 x weekly - 1-2 sets -  10 reps - Standing March at [facility]  - 1 x daily - 1-3 x weekly - 1-2 sets - 10 reps - Squat  - 1 x daily - 1-3 x weekly - 1-2 sets - 10 reps - Holding to pool wall bend knee towards buttocks  - 1 x daily - 1-3 x weekly - 1-2 sets - 10 reps - Leg swings flex/ext  - 1 x daily - 1-3 x weekly - 1-2 sets - 10  reps ASSESSMENT:  CLINICAL IMPRESSION:    Pt tolerates session well. During interventions, pt required occasional stabilization of the physioball with LE exercises, but was able to replicate after a couple reps independently. Endorsed improvement of LBP with a PPT. Would continue to benefit from skilled PT to address the deficits found in order to improve tolerance to activities and ADLs.  Eval:Patient is a 68 y.o. female who was seen today for physical therapy evaluation and treatment for chronic knee pain. She has pain on both sides but it is mostly the L knee that bothers her. Patient reports walking increases her pain and she is unable to do steps going up or down with the LLE. She compensates with her RLE when walking and due to this it has started causing pain in her R lower back. She has had shots in the L knee and it seemed to help for a little bit but she still has pain mostly with any standing and walking activities. Patient will benefit from PT to address her L knee pain as well as the back pain to increase her activity tolerance and improve strength to allow ease with ADLs.  OBJECTIVE IMPAIRMENTS: Abnormal gait, decreased activity tolerance, decreased balance, decreased endurance, difficulty walking, decreased strength, postural dysfunction, and pain.   ACTIVITY LIMITATIONS: squatting, stairs, and locomotion level  PARTICIPATION LIMITATIONS: meal prep, cleaning, laundry, driving, shopping, community activity, and yard work  PERSONAL FACTORS: Age, Fitness, Past/current experiences, and Time since onset of injury/illness/exacerbation are also affecting patient's functional outcome.   REHAB POTENTIAL: Good  CLINICAL DECISION MAKING: Stable/uncomplicated  EVALUATION COMPLEXITY: Low  GOALS: Goals reviewed with patient? Yes  SHORT TERM GOALS: Target date: 10/28/23  Patient will be independent with initial HEP. Baseline:  Goal status: MET 10/14/23  2.  Patient will decrease TUG  <14s Baseline: 22s Goal status: IN PROGRESS 11/11/23   LONG TERM GOALS: Target date: 12/09/23  Patient will be independent with advanced/ongoing HEP to improve outcomes and carryover.  Baseline:  Goal status: IN PROGRESS 10/14/23;11/08/23 (Met in aquatics)  2.  Patient will report at least 75% improvement in  knee pain to improve QOL. (Pain <4/10) Baseline: 8/10 Goal status: IN PROGRESS 11/11/23 50% per pt   3.  Patient will demonstrate improved functional LE strength as demonstrated by 5xSTS <15s. Baseline: 24s Goal status: IN PROGRESS 11/11/23  4. Patient will be able to ascend/descend stairs with 1 HR and reciprocal step pattern safely to access home and community.  Baseline:  Goal status: IN PROGRESS 11/11/23  5.  Patient will tolerate at least 30 min of standing and walking. Baseline: 5-10 mins Goal status: IN PROGRESS 11/11/23 maybe I could do 20   PLAN:  PT FREQUENCY: 2x/week  PT DURATION: 12 weeks  PLANNED INTERVENTIONS: 97110-Therapeutic exercises, 97530- Therapeutic activity, 97112- Neuromuscular re-education, 97535- Self Care, 02859- Manual therapy, [id number]- Gait training, [phone]- Vasopneumatic device, [id number]- Ultrasound, [id number]- Ionotophoresis 4mg /ml Dexamethasone , 79439 (1-2 muscles), 20561 (3+ muscles)- Dry Needling, Patient/Family education, Balance training, Stair training, Taping, Joint  mobilization, Joint manipulation, Spinal manipulation, Spinal mobilization, Cryotherapy, and Moist heat  PLAN FOR NEXT SESSION:   Progressive PREs, lumbar and hip mobility/flexibility, core, re-assess at end of month. Needs land HEP update   Mignonne Afonso, PT, DPT 11/18/23 8:47 AM

## 2023-11-22 ENCOUNTER — Ambulatory Visit

## 2023-11-22 DIAGNOSIS — M25562 Pain in left knee: Secondary | ICD-10-CM | POA: Diagnosis not present

## 2023-11-22 DIAGNOSIS — M5459 Other low back pain: Secondary | ICD-10-CM

## 2023-11-22 DIAGNOSIS — G8929 Other chronic pain: Secondary | ICD-10-CM

## 2023-11-22 NOTE — Therapy (Signed)
 OUTPATIENT PHYSICAL THERAPY LOWER EXTREMITY TREATMENT NOTE    Patient Name: [name] MRN: [id number] DOB:Jun 21, 1955, 68 y.o., female Today's Date: 11/22/2023     END OF SESSION:  PT End of Session - 11/22/23 1439     Visit Number 18    Date for Recertification  --    Authorization Type [employer]    PT Start Time 1442    PT Stop Time 1522    PT Time Calculation (min) 40 min    Activity Tolerance Patient tolerated treatment well    Behavior During Therapy WFL for tasks assessed/performed                    Past Medical History:  Diagnosis Date   Arthritis    Asthma    CHF (congestive heart failure) (HCC)    Coronary artery disease    Diabetes mellitus    Diabetic neuropathy (HCC)    Dysrhythmia    A.fib    takes Eliquis    GERD (gastroesophageal reflux disease)    History of hiatal hernia    History of kidney stones    Hx of cardiovascular stress test    Lexiscan  Myoview (10/15):  Medium area of ischemia in AL and IL distribution, cannot completely exclude shifting breast attenuation, EF 64%; Abnormal study   Hyperlipidemia    Hypertension    Mild carotid artery disease    Morbid obesity (HCC)    PAF (paroxysmal atrial fibrillation) (HCC)    Sleep apnea    does not wear CPAP   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   Syncope    Past Surgical History:  Procedure Laterality Date   ACHILLES TENDON REPAIR  1995   right   BIOPSY  07/22/2021   Procedure: BIOPSY;  Surgeon: Saintclair Jasper, MD;  Location: [facility];  Service: Gastroenterology;;   CATARACT EXTRACTION Bilateral    COLONOSCOPY     COLONOSCOPY WITH PROPOFOL  N/A 07/22/2021   Procedure: COLONOSCOPY WITH PROPOFOL ;  Surgeon: Saintclair Jasper, MD;  Location: [facility];  Service: Gastroenterology;  Laterality: N/A;   CYSTOSCOPY W/ RETROGRADES Right 06/15/2023   Procedure: CYSTOSCOPY, WITH RETROGRADE PYELOGRAM;  Surgeon: Shane Steffan BROCKS, MD;  Location: [facility];  Service: Urology;  Laterality: Right;    CYSTOSCOPY/URETEROSCOPY/HOLMIUM LASER/STENT PLACEMENT Right 06/15/2023   Procedure: CYSTOSCOPY/URETEROSCOPY/HOLMIUM LASER/STENT PLACEMENT;  Surgeon: Shane Steffan BROCKS, MD;  Location: [facility];  Service: Urology;  Laterality: Right;   DILATION AND CURETTAGE OF UTERUS     ESOPHAGOGASTRODUODENOSCOPY (EGD) WITH PROPOFOL  N/A 07/22/2021   Procedure: ESOPHAGOGASTRODUODENOSCOPY (EGD) WITH PROPOFOL ;  Surgeon: Saintclair Jasper, MD;  Location: [facility];  Service: Gastroenterology;  Laterality: N/A;   EYE SURGERY Left    detached retina repair   HAND SURGERY Right    right  pinky finger   ORIF   heart stent  2007   PARS PLANA VITRECTOMY Left 03/24/2017   Procedure: PARS PLANA VITRECTOMY WITH 25 GAUGE;  Surgeon: Elner Arley LABOR, MD;  Location: [facility];  Service: Ophthalmology;  Laterality: Left;   POLYPECTOMY  07/22/2021   Procedure: POLYPECTOMY;  Surgeon: Saintclair Jasper, MD;  Location: [facility];  Service: Gastroenterology;;   UTERINE FIBROID SURGERY  2008   Patient Active Problem List   Diagnosis Date Noted   Paroxysmal atrial fibrillation with RVR (HCC) 11/28/2020   Hypertensive urgency 11/28/2020   Syncope and collapse 11/28/2020   Nausea 11/28/2020   Hypokalemia 11/28/2020   Hyperglycemia due to diabetes mellitus (HCC) 11/28/2020   GERD (gastroesophageal reflux disease)  11/28/2020   Asthma 11/28/2020   Coronary atherosclerosis of native coronary artery 07/17/2013   Mixed hyperlipidemia 07/17/2013   Essential hypertension, benign 07/17/2013   Obesity 08/11/2011    PCP: Alberta Sharps  REFERRING PROVIDER: Marcey Her  REFERRING DIAG: Pain in L knee  THERAPY DIAG:  Chronic pain of left knee  Bilateral chronic knee pain  Other low back pain  Rationale for Evaluation and Treatment: Rehabilitation  ONSET DATE: chronic  SUBJECTIVE:   SUBJECTIVE STATEMENT:  Pt reports as fall after she left when walking up the steps, but says I think it knocked some things into place. I was a little  sore but my back and knee is a little better.  EVAL: My knees hurt, the left one more. It is to the point where I can't step up or down on the L side. It has started making the back on the R side hurt now. They put a shot in it in June, it doesn't hurt like it did but I still don't have use of it.   PERTINENT HISTORY: Arthritis, CHF, DM, CAD, PAF, Stroke in 2022  PAIN:  Are you having pain? Yes: NPRS scale: 3/10 Pain location: L knee  Pain description: just hurts  Aggravating factors: walking, sitting or laying for too long and then trying to get up, depends on position, sometimes has no rhythm to it  Relieving factors: finding the right position but making sure I still move enough   PRECAUTIONS: None  RED FLAGS: None   WEIGHT BEARING RESTRICTIONS: No  FALLS:  Has patient fallen in last 6 months? No  LIVING ENVIRONMENT: Lives with: lives alone Lives in: House/apartment Stairs: Yes: Internal: steps into the basement steps; on left going up and External: 2 steps; on left going up   OCCUPATION: retired from social services   PLOF: Independent and Independent with basic ADLs  PATIENT GOALS:  to get rid of the pain and be able to walk and move again   NEXT MD VISIT:   OBJECTIVE:  Note: Objective measures were completed at Evaluation unless otherwise noted.  DIAGNOSTIC FINDINGS:   COGNITION: Overall cognitive status: Within functional limits for tasks assessed     SENSATION: WFL   MUSCLE LENGTH: Hamstrings: some tightness in BLE  POSTURE: rounded shoulders  LOWER EXTREMITY ROM: WFL Some pain with hip flexion on both sides in R side LBP Some pain with end range flexion on L knee  LOWER EXTREMITY MMT:  MMT Right eval Left eval 10/2 R  10/2 L   Hip flexion 4- 5 4+ 4+  Hip extension      Hip abduction      Hip adduction      Hip internal rotation      Hip external rotation      Knee flexion 4+ 4- 5 4+ pain   Knee extension 4+ 4+ 5 4- pain   Ankle  dorsiflexion      Ankle plantarflexion      Ankle inversion      Ankle eversion       (Blank rows = not tested)   FUNCTIONAL TESTS:  5 times sit to stand: 24s; 10/14/23 26.8 seconds no UEs; 11/11/23- 19.8 seconds no UEs   Timed up and go (TUG): 22s; 10/14/23 17 seconds; 11/11/23 16.8 seconds no device    GAIT: Distance walked: in clinic distances Assistive device utilized: None Level of assistance: Complete Independence and Modified independence Comments: antalgic gait, weight shifting on the RLE, taking  quick steps with LLE to avoid using it and weight bearing                                                                                                                                 TREATMENT DATE:   11/22/23:  Nustep lvl 5 for 6 min Seated Clamshells with Yellow Theraband 2 x 12 Seated Ball Squeezes 2 x 12 Seated LAQ with 2 lb AW x 10 each side Seated Marches with 2lbs x 10 each side Cable Pulley Shoulder Extension 5 lbs 2 x 10  Cable Pulley Biceps Curl 5 lbs 2 x 10 Seated Pelvic Tilts  11/18/23: Supine PPT x 10 with 5 second holds Supine Pball HS Curls 2 x 10 Seated Pball Abdominals 2 x 10 with 3 second holds Supine Pball Trunk Rotation x 10 each way Standing resisted Hip flx, abd, and ext in // bars with yellow tband 3 x 5 each way for SL balance Nustep Lvl 5 for 6 min  11/16/23 3# LAQ 2 sets 10 3# hip flex and abd seated 2 sets 10 Green tband HS curl 2 sets 10 STS 10 x with yellow wt ball Nustep L 5 6 min 20# resisted giat 5 x fwd and backward, 23 x each side   11/11/23  TUG, MMT, 5xSTS, stair assessment, goals, POC and education on all   Nustep L2x6 minutes BLEs only seat 8  Bridges x10 Seated SLRs x10 B 0# STS + yellow TB around knees x10   11/05/23 Pt seen for aquatic therapy today.  Treatment took place in water  3.5-4.75 ft in depth at the [facility] pool. Temp of water  was 91.  Pt entered/exited the pool via lift (for safety Pt afraid of  water ) with hand rail.  Exercises - Walking  - Standing 'L' Stretch at [employer]   - forward march with knee kick  - Heel Toe Raises at [facility]   - Kick Leg Out To The Side  - Standing March at [facility]   - Squat - Holding to pool wall bend knee towards buttocks   - Leg swings flex/ext     Pt requires the buoyancy and hydrostatic pressure of water  for support, and to offload joints by unweighting joint load by at least 50 % in navel deep water  and by at least 75-80% in chest to neck deep water .  Viscosity of the water  is needed for resistance of strengthening. Water  current perturbations provides challenge to standing balance requiring increased core activation.     PATIENT EDUCATION:  Education details: POC, HEP Person educated: Patient Education method: Explanation Education comprehension: verbalized understanding  HOME EXERCISE PROGRAM: Access Code: [email or web address] URL: [email or web address] Date: 09/16/2023 Prepared by: Almetta Fam  Exercises - Sit to Stand  - 1 x daily - 7 x weekly - 2 sets - 10 reps - Seated Long Arc Quad  - 1 x daily - 7 x weekly -  2 sets - 10 reps - 3 hold - Heel Raises with Counter Support  - 1 x daily - 7 x weekly - 2 sets - 10 reps - Supine Lower Trunk Rotation  - 1 x daily - 7 x weekly - 2 sets - 10 reps - Supine Bridge  - 1 x daily - 7 x weekly - 2 sets - 10 reps   Aquatic This aquatic home exercise program from [employer] utilizes pictures from land based exercises, but has been adapted prior to lamination and issuance.   Access Code: [email or web address] URL: [email or web address] Date: 11/08/2023 Prepared by: Frankie Ziemba  Exercises - Walking  - 1 x daily - 1-3 x weekly - 1-2 sets - 10 reps - Standing 'L' Stretch at [employer]  - 1 x daily - 1-3 x weekly - 1-2 sets - 10 reps - forward march with knee kick  - 1 x daily - 1-3 x weekly - Heel Toe Raises at Pool Wall  - 1 x daily - 1-3 x weekly - 1-2 sets - 10 reps - Kick Leg  Out To The Side  - 1 x daily - 1-3 x weekly - 1-2 sets - 10 reps - Standing March at [employer]  - 1 x daily - 1-3 x weekly - 1-2 sets - 10 reps - Squat  - 1 x daily - 1-3 x weekly - 1-2 sets - 10 reps - Holding to pool wall bend knee towards buttocks  - 1 x daily - 1-3 x weekly - 1-2 sets - 10 reps - Leg swings flex/ext  - 1 x daily - 1-3 x weekly - 1-2 sets - 10 reps ASSESSMENT:  CLINICAL IMPRESSION:    Pt tolerates session well. Able to replicate PPT when cued and interventions when cued, but would require occasional rest breaks d/t fatigue. Endorsed improvement of LBP with a PPT . Would continue to benefit from skilled PT to address the deficits found in order to improve tolerance to activities and ADLs.  Eval:Patient is a 68 y.o. female who was seen today for physical therapy evaluation and treatment for chronic knee pain. She has pain on both sides but it is mostly the L knee that bothers her. Patient reports walking increases her pain and she is unable to do steps going up or down with the LLE. She compensates with her RLE when walking and due to this it has started causing pain in her R lower back. She has had shots in the L knee and it seemed to help for a little bit but she still has pain mostly with any standing and walking activities. Patient will benefit from PT to address her L knee pain as well as the back pain to increase her activity tolerance and improve strength to allow ease with ADLs.  OBJECTIVE IMPAIRMENTS: Abnormal gait, decreased activity tolerance, decreased balance, decreased endurance, difficulty walking, decreased strength, postural dysfunction, and pain.   ACTIVITY LIMITATIONS: squatting, stairs, and locomotion level  PARTICIPATION LIMITATIONS: meal prep, cleaning, laundry, driving, shopping, community activity, and yard work  PERSONAL FACTORS: Age, Fitness, Past/current experiences, and Time since onset of injury/illness/exacerbation are also affecting patient's  functional outcome.   REHAB POTENTIAL: Good  CLINICAL DECISION MAKING: Stable/uncomplicated  EVALUATION COMPLEXITY: Low  GOALS: Goals reviewed with patient? Yes  SHORT TERM GOALS: Target date: 10/28/23  Patient will be independent with initial HEP. Baseline:  Goal status: MET 10/14/23  2.  Patient will decrease TUG <14s Baseline: 22s Goal status: IN  PROGRESS 11/11/23   LONG TERM GOALS: Target date: 12/09/23  Patient will be independent with advanced/ongoing HEP to improve outcomes and carryover.  Baseline:  Goal status: IN PROGRESS 10/14/23;11/08/23 (Met in aquatics)  2.  Patient will report at least 75% improvement in  knee pain to improve QOL. (Pain <4/10) Baseline: 8/10 Goal status: IN PROGRESS 11/11/23 50% per pt   3.  Patient will demonstrate improved functional LE strength as demonstrated by 5xSTS <15s. Baseline: 24s Goal status: IN PROGRESS 11/11/23  4. Patient will be able to ascend/descend stairs with 1 HR and reciprocal step pattern safely to access home and community.  Baseline:  Goal status: IN PROGRESS 11/11/23  5.  Patient will tolerate at least 30 min of standing and walking. Baseline: 5-10 mins Goal status: IN PROGRESS 11/11/23 maybe I could do 20   PLAN:  PT FREQUENCY: 2x/week  PT DURATION: 12 weeks  PLANNED INTERVENTIONS: 97110-Therapeutic exercises, 97530- Therapeutic activity, 97112- Neuromuscular re-education, 97535- Self Care, 02859- Manual therapy, [phone]- Gait training, [phone]- Vasopneumatic device, [id number]- Ultrasound, [id number]- Ionotophoresis 4mg /ml Dexamethasone , 79439 (1-2 muscles), 20561 (3+ muscles)- Dry Needling, Patient/Family education, Balance training, Stair training, Taping, Joint mobilization, Joint manipulation, Spinal manipulation, Spinal mobilization, Cryotherapy, and Moist heat  PLAN FOR NEXT SESSION:   Progressive PREs, lumbar and hip mobility/flexibility, core, re-assess at end of month. Needs land HEP update   Jeremie Abdelaziz, PT,  DPT 11/22/23 3:22 PM

## 2023-11-24 ENCOUNTER — Ambulatory Visit

## 2023-11-29 ENCOUNTER — Ambulatory Visit: Admitting: Physical Therapy

## 2023-11-29 ENCOUNTER — Encounter: Payer: Self-pay | Admitting: Physical Therapy

## 2023-11-29 DIAGNOSIS — G8929 Other chronic pain: Secondary | ICD-10-CM

## 2023-11-29 DIAGNOSIS — M5459 Other low back pain: Secondary | ICD-10-CM

## 2023-11-29 DIAGNOSIS — M25562 Pain in left knee: Secondary | ICD-10-CM | POA: Diagnosis not present

## 2023-11-29 NOTE — Therapy (Signed)
 OUTPATIENT PHYSICAL THERAPY LOWER EXTREMITY TREATMENT NOTE    Patient Name: [name] MRN: [id number] DOB:04/19/1955, 68 y.o., female Today's Date: 11/29/2023     END OF SESSION:  PT End of Session - 11/29/23 1015     Visit Number 19    Date for Recertification  12/09/23    PT Start Time 1016    PT Stop Time 1100    PT Time Calculation (min) 44 min    Activity Tolerance Patient tolerated treatment well    Behavior During Therapy WFL for tasks assessed/performed                    Past Medical History:  Diagnosis Date   Arthritis    Asthma    CHF (congestive heart failure) (HCC)    Coronary artery disease    Diabetes mellitus    Diabetic neuropathy (HCC)    Dysrhythmia    A.fib    takes Eliquis    GERD (gastroesophageal reflux disease)    History of hiatal hernia    History of kidney stones    Hx of cardiovascular stress test    Lexiscan  Myoview (10/15):  Medium area of ischemia in AL and IL distribution, cannot completely exclude shifting breast attenuation, EF 64%; Abnormal study   Hyperlipidemia    Hypertension    Mild carotid artery disease    Morbid obesity (HCC)    PAF (paroxysmal atrial fibrillation) (HCC)    Sleep apnea    does not wear CPAP   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   Syncope    Past Surgical History:  Procedure Laterality Date   ACHILLES TENDON REPAIR  1995   right   BIOPSY  07/22/2021   Procedure: BIOPSY;  Surgeon: Saintclair Jasper, MD;  Location: [facility];  Service: Gastroenterology;;   CATARACT EXTRACTION Bilateral    COLONOSCOPY     COLONOSCOPY WITH PROPOFOL  N/A 07/22/2021   Procedure: COLONOSCOPY WITH PROPOFOL ;  Surgeon: Saintclair Jasper, MD;  Location: [facility];  Service: Gastroenterology;  Laterality: N/A;   CYSTOSCOPY W/ RETROGRADES Right 06/15/2023   Procedure: CYSTOSCOPY, WITH RETROGRADE PYELOGRAM;  Surgeon: Shane Steffan BROCKS, MD;  Location: [facility];  Service: Urology;  Laterality: Right;    CYSTOSCOPY/URETEROSCOPY/HOLMIUM LASER/STENT PLACEMENT Right 06/15/2023   Procedure: CYSTOSCOPY/URETEROSCOPY/HOLMIUM LASER/STENT PLACEMENT;  Surgeon: Shane Steffan BROCKS, MD;  Location: [facility];  Service: Urology;  Laterality: Right;   DILATION AND CURETTAGE OF UTERUS     ESOPHAGOGASTRODUODENOSCOPY (EGD) WITH PROPOFOL  N/A 07/22/2021   Procedure: ESOPHAGOGASTRODUODENOSCOPY (EGD) WITH PROPOFOL ;  Surgeon: Saintclair Jasper, MD;  Location: [facility];  Service: Gastroenterology;  Laterality: N/A;   EYE SURGERY Left    detached retina repair   HAND SURGERY Right    right  pinky finger   ORIF   heart stent  2007   PARS PLANA VITRECTOMY Left 03/24/2017   Procedure: PARS PLANA VITRECTOMY WITH 25 GAUGE;  Surgeon: Elner Arley LABOR, MD;  Location: [facility] OR;  Service: Ophthalmology;  Laterality: Left;   POLYPECTOMY  07/22/2021   Procedure: POLYPECTOMY;  Surgeon: Saintclair Jasper, MD;  Location: [facility];  Service: Gastroenterology;;   UTERINE FIBROID SURGERY  2008   Patient Active Problem List   Diagnosis Date Noted   Paroxysmal atrial fibrillation with RVR (HCC) 11/28/2020   Hypertensive urgency 11/28/2020   Syncope and collapse 11/28/2020   Nausea 11/28/2020   Hypokalemia 11/28/2020   Hyperglycemia due to diabetes mellitus (HCC) 11/28/2020   GERD (gastroesophageal reflux disease) 11/28/2020   Asthma 11/28/2020  Coronary atherosclerosis of native coronary artery 07/17/2013   Mixed hyperlipidemia 07/17/2013   Essential hypertension, benign 07/17/2013   Obesity 08/11/2011    PCP: Alberta Sharps  REFERRING PROVIDER: Marcey Her  REFERRING DIAG: Pain in L knee  THERAPY DIAG:  Chronic pain of left knee  Bilateral chronic knee pain  Other low back pain  Rationale for Evaluation and Treatment: Rehabilitation  ONSET DATE: chronic  SUBJECTIVE:   SUBJECTIVE STATEMENT:  Im going to say I am well Tries to get her exercises in  EVAL: My knees hurt, the left one more. It is to the point where I  can't step up or down on the L side. It has started making the back on the R side hurt now. They put a shot in it in June, it doesn't hurt like it did but I still don't have use of it.   PERTINENT HISTORY: Arthritis, CHF, DM, CAD, PAF, Stroke in 2022  PAIN:  Are you having pain? Yes: NPRS scale: 4/10 Pain location: L knee  Pain description: just hurts  Aggravating factors: walking, sitting or laying for too long and then trying to get up, depends on position, sometimes has no rhythm to it  Relieving factors: finding the right position but making sure I still move enough   PRECAUTIONS: None  RED FLAGS: None   WEIGHT BEARING RESTRICTIONS: No  FALLS:  Has patient fallen in last 6 months? No  LIVING ENVIRONMENT: Lives with: lives alone Lives in: House/apartment Stairs: Yes: Internal: steps into the basement steps; on left going up and External: 2 steps; on left going up   OCCUPATION: retired from social services   PLOF: Independent and Independent with basic ADLs  PATIENT GOALS:  to get rid of the pain and be able to walk and move again   NEXT MD VISIT:   OBJECTIVE:  Note: Objective measures were completed at Evaluation unless otherwise noted.  DIAGNOSTIC FINDINGS:   COGNITION: Overall cognitive status: Within functional limits for tasks assessed     SENSATION: WFL   MUSCLE LENGTH: Hamstrings: some tightness in BLE  POSTURE: rounded shoulders  LOWER EXTREMITY ROM: WFL Some pain with hip flexion on both sides in R side LBP Some pain with end range flexion on L knee  LOWER EXTREMITY MMT:  MMT Right eval Left eval 10/2 R  10/2 L   Hip flexion 4- 5 4+ 4+  Hip extension      Hip abduction      Hip adduction      Hip internal rotation      Hip external rotation      Knee flexion 4+ 4- 5 4+ pain   Knee extension 4+ 4+ 5 4- pain   Ankle dorsiflexion      Ankle plantarflexion      Ankle inversion      Ankle eversion       (Blank rows = not  tested)   FUNCTIONAL TESTS:  5 times sit to stand: 24s; 10/14/23 26.8 seconds no UEs; 11/11/23- 19.8 seconds no UEs   Timed up and go (TUG): 22s; 10/14/23 17 seconds; 11/11/23 16.8 seconds no device    GAIT: Distance walked: in clinic distances Assistive device utilized: None Level of assistance: Complete Independence and Modified independence Comments: antalgic gait, weight shifting on the RLE, taking quick steps with LLE to avoid using it and weight bearing  TREATMENT DATE:  11/29/23 NuStep L 5 x 6 min 5X S2S 20.42 sec TUG 13 sec 20lb Resisted gait 4 way x 3 each  3# LAQ 2 sets 10 Standing march w/ 3lb Cuff 2x10   11/22/23:  Nustep lvl 5 for 6 min Seated Clamshells with Yellow Theraband 2 x 12 Seated Ball Squeezes 2 x 12 Seated LAQ with 2 lb AW x 10 each side Seated Marches with 2lbs x 10 each side Cable Pulley Shoulder Extension 5 lbs 2 x 10  Cable Pulley Biceps Curl 5 lbs 2 x 10 Seated Pelvic Tilts  11/18/23: Supine PPT x 10 with 5 second holds Supine Pball HS Curls 2 x 10 Seated Pball Abdominals 2 x 10 with 3 second holds Supine Pball Trunk Rotation x 10 each way Standing resisted Hip flx, abd, and ext in // bars with yellow tband 3 x 5 each way for SL balance Nustep Lvl 5 for 6 min  11/16/23 3# LAQ 2 sets 10 3# hip flex and abd seated 2 sets 10 Green tband HS curl 2 sets 10 STS 10 x with yellow wt ball Nustep L 5 6 min 20# resisted giat 5 x fwd and backward, 23 x each side   11/11/23  TUG, MMT, 5xSTS, stair assessment, goals, POC and education on all   Nustep L2x6 minutes BLEs only seat 8  Bridges x10 Seated SLRs x10 B 0# STS + yellow TB around knees x10   11/05/23 Pt seen for aquatic therapy today.  Treatment took place in water  3.5-4.75 ft in depth at the [facility] pool. Temp of water  was 91.  Pt entered/exited the  pool via lift (for safety Pt afraid of water ) with hand rail.  Exercises - Walking  - Standing 'L' Stretch at [employer]   - forward march with knee kick  - Heel Toe Raises at [facility]   - Kick Leg Out To The Side  - Standing March at [facility]   - Squat - Holding to pool wall bend knee towards buttocks   - Leg swings flex/ext     Pt requires the buoyancy and hydrostatic pressure of water  for support, and to offload joints by unweighting joint load by at least 50 % in navel deep water  and by at least 75-80% in chest to neck deep water .  Viscosity of the water  is needed for resistance of strengthening. Water  current perturbations provides challenge to standing balance requiring increased core activation.     PATIENT EDUCATION:  Education details: POC, HEP Person educated: Patient Education method: Explanation Education comprehension: verbalized understanding  HOME EXERCISE PROGRAM: Access Code: [email or web address] URL: [email or web address] Date: 09/16/2023 Prepared by: Almetta Fam  Exercises - Sit to Stand  - 1 x daily - 7 x weekly - 2 sets - 10 reps - Seated Long Arc Quad  - 1 x daily - 7 x weekly - 2 sets - 10 reps - 3 hold - Heel Raises with Counter Support  - 1 x daily - 7 x weekly - 2 sets - 10 reps - Supine Lower Trunk Rotation  - 1 x daily - 7 x weekly - 2 sets - 10 reps - Supine Bridge  - 1 x daily - 7 x weekly - 2 sets - 10 reps   Aquatic This aquatic home exercise program from MedBridge utilizes pictures from land based exercises, but has been adapted prior to lamination and issuance.   Access Code: [email or web address] URL: [email or web address] Date: 11/08/2023 Prepared by: Matilda  Ziemba  Exercises - Walking  - 1 x daily - 1-3 x weekly - 1-2 sets - 10 reps - Standing 'L' Stretch at [employer]  - 1 x daily - 1-3 x weekly - 1-2 sets - 10 reps - forward march with knee kick  - 1 x daily - 1-3 x weekly - Heel Toe Raises at Pool Wall  - 1 x daily - 1-3 x  weekly - 1-2 sets - 10 reps - Kick Leg Out To The Side  - 1 x daily - 1-3 x weekly - 1-2 sets - 10 reps - Standing March at [facility]  - 1 x daily - 1-3 x weekly - 1-2 sets - 10 reps - Squat  - 1 x daily - 1-3 x weekly - 1-2 sets - 10 reps - Holding to pool wall bend knee towards buttocks  - 1 x daily - 1-3 x weekly - 1-2 sets - 10 reps - Leg swings flex/ext  - 1 x daily - 1-3 x weekly - 1-2 sets - 10 reps ASSESSMENT:  CLINICAL IMPRESSION:    Pt tolerates session well. She has progressed meeting 5x sit to stand and TUG goal. Occasional rest breaks d/t fatigue needed during session. Cues to avoid narrow base of support during resisted side steps  Would continue to benefit from skilled PT to address the deficits found in order to improve tolerance to activities and ADLs.  Eval:Patient is a 68 y.o. female who was seen today for physical therapy evaluation and treatment for chronic knee pain. She has pain on both sides but it is mostly the L knee that bothers her. Patient reports walking increases her pain and she is unable to do steps going up or down with the LLE. She compensates with her RLE when walking and due to this it has started causing pain in her R lower back. She has had shots in the L knee and it seemed to help for a little bit but she still has pain mostly with any standing and walking activities. Patient will benefit from PT to address her L knee pain as well as the back pain to increase her activity tolerance and improve strength to allow ease with ADLs.  OBJECTIVE IMPAIRMENTS: Abnormal gait, decreased activity tolerance, decreased balance, decreased endurance, difficulty walking, decreased strength, postural dysfunction, and pain.   ACTIVITY LIMITATIONS: squatting, stairs, and locomotion level  PARTICIPATION LIMITATIONS: meal prep, cleaning, laundry, driving, shopping, community activity, and yard work  PERSONAL FACTORS: Age, Fitness, Past/current experiences, and Time since onset of  injury/illness/exacerbation are also affecting patient's functional outcome.   REHAB POTENTIAL: Good  CLINICAL DECISION MAKING: Stable/uncomplicated  EVALUATION COMPLEXITY: Low  GOALS: Goals reviewed with patient? Yes  SHORT TERM GOALS: Target date: 10/28/23  Patient will be independent with initial HEP. Baseline:  Goal status: MET 10/14/23  2.  Patient will decrease TUG <14s Baseline: 22s Goal status: IN PROGRESS 11/11/23, Met 13 sec    LONG TERM GOALS: Target date: 12/09/23  Patient will be independent with advanced/ongoing HEP to improve outcomes and carryover.  Baseline:  Goal status: IN PROGRESS 10/14/23;11/08/23 (Met in aquatics)  2.  Patient will report at least 75% improvement in  knee pain to improve QOL. (Pain <4/10) Baseline: 8/10 Goal status: IN PROGRESS 11/11/23 50% per pt   3.  Patient will demonstrate improved functional LE strength as demonstrated by 5xSTS <15s. Baseline: 24s Goal status: IN PROGRESS 11/11/23, Met 11/29/23 20.42 sec   4. Patient will  be able to ascend/descend stairs with 1 HR and reciprocal step pattern safely to access home and community.  Baseline:  Goal status: IN PROGRESS 11/11/23  5.  Patient will tolerate at least 30 min of standing and walking. Baseline: 5-10 mins Goal status: IN PROGRESS 11/11/23 maybe I could do 20   PLAN:  PT FREQUENCY: 2x/week  PT DURATION: 12 weeks  PLANNED INTERVENTIONS: 97110-Therapeutic exercises, 97530- Therapeutic activity, 97112- Neuromuscular re-education, 97535- Self Care, 02859- Manual therapy, [phone]- Gait training, [id number]- Vasopneumatic device, [id number]- Ultrasound, [id number]- Ionotophoresis 4mg /ml Dexamethasone , 79439 (1-2 muscles), [id number] (3+ muscles)- Dry Needling, Patient/Family education, Balance training, Stair training, Taping, Joint mobilization, Joint manipulation, Spinal manipulation, Spinal mobilization, Cryotherapy, and Moist heat  PLAN FOR NEXT SESSION:   Progressive PREs, lumbar and hip  mobility/flexibility, core, re-assess at end of month.  Tanda Sorrow, PTA 11/29/23 10:16 AM

## 2023-12-01 ENCOUNTER — Encounter: Payer: Self-pay | Admitting: Physical Therapy

## 2023-12-01 ENCOUNTER — Ambulatory Visit: Admitting: Physical Therapy

## 2023-12-01 DIAGNOSIS — M5459 Other low back pain: Secondary | ICD-10-CM

## 2023-12-01 DIAGNOSIS — M25562 Pain in left knee: Secondary | ICD-10-CM | POA: Diagnosis not present

## 2023-12-01 DIAGNOSIS — G8929 Other chronic pain: Secondary | ICD-10-CM

## 2023-12-01 NOTE — Therapy (Signed)
 OUTPATIENT PHYSICAL THERAPY LOWER EXTREMITY TREATMENT NOTE   Progress Note Reporting Period 10/27/23 to 12/01/23 for visits 11-20  See note below for Objective Data and Assessment of Progress/Goals.     Patient Name: [name] MRN: [id number] DOB:1955/04/12, 68 y.o., female Today's Date: 12/01/2023     END OF SESSION:  PT End of Session - 12/01/23 1017     Visit Number 20    Date for Recertification  12/09/23    PT Start Time 1017    PT Stop Time 1100    PT Time Calculation (min) 43 min    Activity Tolerance Patient tolerated treatment well    Behavior During Therapy WFL for tasks assessed/performed                    Past Medical History:  Diagnosis Date   Arthritis    Asthma    CHF (congestive heart failure) (HCC)    Coronary artery disease    Diabetes mellitus    Diabetic neuropathy (HCC)    Dysrhythmia    A.fib    takes Eliquis    GERD (gastroesophageal reflux disease)    History of hiatal hernia    History of kidney stones    Hx of cardiovascular stress test    Lexiscan  Myoview (10/15):  Medium area of ischemia in AL and IL distribution, cannot completely exclude shifting breast attenuation, EF 64%; Abnormal study   Hyperlipidemia    Hypertension    Mild carotid artery disease    Morbid obesity (HCC)    PAF (paroxysmal atrial fibrillation) (HCC)    Sleep apnea    does not wear CPAP   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   Syncope    Past Surgical History:  Procedure Laterality Date   ACHILLES TENDON REPAIR  1995   right   BIOPSY  07/22/2021   Procedure: BIOPSY;  Surgeon: Saintclair Jasper, MD;  Location: [facility];  Service: Gastroenterology;;   CATARACT EXTRACTION Bilateral    COLONOSCOPY     COLONOSCOPY WITH PROPOFOL  N/A 07/22/2021   Procedure: COLONOSCOPY WITH PROPOFOL ;  Surgeon: Saintclair Jasper, MD;  Location: [facility];  Service: Gastroenterology;  Laterality: N/A;   CYSTOSCOPY W/ RETROGRADES Right 06/15/2023   Procedure: CYSTOSCOPY,  WITH RETROGRADE PYELOGRAM;  Surgeon: Shane Steffan BROCKS, MD;  Location: [facility];  Service: Urology;  Laterality: Right;   CYSTOSCOPY/URETEROSCOPY/HOLMIUM LASER/STENT PLACEMENT Right 06/15/2023   Procedure: CYSTOSCOPY/URETEROSCOPY/HOLMIUM LASER/STENT PLACEMENT;  Surgeon: Shane Steffan BROCKS, MD;  Location: [facility];  Service: Urology;  Laterality: Right;   DILATION AND CURETTAGE OF UTERUS     ESOPHAGOGASTRODUODENOSCOPY (EGD) WITH PROPOFOL  N/A 07/22/2021   Procedure: ESOPHAGOGASTRODUODENOSCOPY (EGD) WITH PROPOFOL ;  Surgeon: Saintclair Jasper, MD;  Location: [facility];  Service: Gastroenterology;  Laterality: N/A;   EYE SURGERY Left    detached retina repair   HAND SURGERY Right    right  pinky finger   ORIF   heart stent  2007   PARS PLANA VITRECTOMY Left 03/24/2017   Procedure: PARS PLANA VITRECTOMY WITH 25 GAUGE;  Surgeon: Elner Arley LABOR, MD;  Location: [facility];  Service: Ophthalmology;  Laterality: Left;   POLYPECTOMY  07/22/2021   Procedure: POLYPECTOMY;  Surgeon: Saintclair Jasper, MD;  Location: [facility];  Service: Gastroenterology;;   UTERINE FIBROID SURGERY  2008   Patient Active Problem List   Diagnosis Date Noted   Paroxysmal atrial fibrillation with RVR (HCC) 11/28/2020   Hypertensive urgency 11/28/2020   Syncope and collapse 11/28/2020   Nausea 11/28/2020  Hypokalemia 11/28/2020   Hyperglycemia due to diabetes mellitus (HCC) 11/28/2020   GERD (gastroesophageal reflux disease) 11/28/2020   Asthma 11/28/2020   Coronary atherosclerosis of native coronary artery 07/17/2013   Mixed hyperlipidemia 07/17/2013   Essential hypertension, benign 07/17/2013   Obesity 08/11/2011    PCP: Alberta Sharps  REFERRING PROVIDER: Marcey Her  REFERRING DIAG: Pain in L knee  THERAPY DIAG:  Chronic pain of left knee  Bilateral chronic knee pain  Other low back pain  Rationale for Evaluation and Treatment: Rehabilitation  ONSET DATE: chronic  SUBJECTIVE:   SUBJECTIVE STATEMENT:  Not  bad still having some L knee pain with activity  EVAL: My knees hurt, the left one more. It is to the point where I can't step up or down on the L side. It has started making the back on the R side hurt now. They put a shot in it in June, it doesn't hurt like it did but I still don't have use of it.   PERTINENT HISTORY: Arthritis, CHF, DM, CAD, PAF, Stroke in 2022  PAIN:  Are you having pain? Yes: NPRS scale: 4/10 Pain location: L knee  Pain description: just hurts  Aggravating factors: walking, sitting or laying for too long and then trying to get up, depends on position, sometimes has no rhythm to it  Relieving factors: finding the right position but making sure I still move enough   PRECAUTIONS: None  RED FLAGS: None   WEIGHT BEARING RESTRICTIONS: No  FALLS:  Has patient fallen in last 6 months? No  LIVING ENVIRONMENT: Lives with: lives alone Lives in: House/apartment Stairs: Yes: Internal: steps into the basement steps; on left going up and External: 2 steps; on left going up   OCCUPATION: retired from social services   PLOF: Independent and Independent with basic ADLs  PATIENT GOALS:  to get rid of the pain and be able to walk and move again   NEXT MD VISIT:   OBJECTIVE:  Note: Objective measures were completed at Evaluation unless otherwise noted.  DIAGNOSTIC FINDINGS:   COGNITION: Overall cognitive status: Within functional limits for tasks assessed     SENSATION: WFL   MUSCLE LENGTH: Hamstrings: some tightness in BLE  POSTURE: rounded shoulders  LOWER EXTREMITY ROM: WFL Some pain with hip flexion on both sides in R side LBP Some pain with end range flexion on L knee  LOWER EXTREMITY MMT:  MMT Right eval Left eval 10/2 R  10/2 L  Right 12/01/23 Left 12/01/23  Hip flexion 4- 5 4+ 4+ 5 5  Hip extension        Hip abduction        Hip adduction        Hip internal rotation        Hip external rotation        Knee flexion 4+ 4- 5 4+ pain  5  4+  Knee extension 4+ 4+ 5 4- pain  5 4- p!  Ankle dorsiflexion        Ankle plantarflexion        Ankle inversion        Ankle eversion         (Blank rows = not tested)   FUNCTIONAL TESTS:  5 times sit to stand: 24s; 10/14/23 26.8 seconds no UEs; 11/11/23- 19.8 seconds no UEs   Timed up and go (TUG): 22s; 10/14/23 17 seconds; 11/11/23 16.8 seconds no device    GAIT: Distance walked: in clinic distances Assistive  device utilized: None Level of assistance: Complete Independence and Modified independence Comments: antalgic gait, weight shifting on the RLE, taking quick steps with LLE to avoid using it and weight bearing                                                                                                                                 TREATMENT DATE:  12/01/23 NuStep L 5 x 7 min  Goals  Stairs training 2 raisl 4 in alt pattern, L knee weakness and pain Sit to stands 2x10 holding red ball  HS curls 20lb 2x10   11/29/23 NuStep L 5 x 6 min 5X S2S 20.42 sec TUG 13 sec 20lb Resisted gait 4 way x 3 each  3# LAQ 2 sets 10 Standing march w/ 3lb Cuff 2x10   11/22/23:  Nustep lvl 5 for 6 min Seated Clamshells with Yellow Theraband 2 x 12 Seated Ball Squeezes 2 x 12 Seated LAQ with 2 lb AW x 10 each side Seated Marches with 2lbs x 10 each side Cable Pulley Shoulder Extension 5 lbs 2 x 10  Cable Pulley Biceps Curl 5 lbs 2 x 10 Seated Pelvic Tilts  11/18/23: Supine PPT x 10 with 5 second holds Supine Pball HS Curls 2 x 10 Seated Pball Abdominals 2 x 10 with 3 second holds Supine Pball Trunk Rotation x 10 each way Standing resisted Hip flx, abd, and ext in // bars with yellow tband 3 x 5 each way for SL balance Nustep Lvl 5 for 6 min  11/16/23 3# LAQ 2 sets 10 3# hip flex and abd seated 2 sets 10 Green tband HS curl 2 sets 10 STS 10 x with yellow wt ball Nustep L 5 6 min 20# resisted giat 5 x fwd and backward, 23 x each side   11/11/23  TUG, MMT, 5xSTS, stair  assessment, goals, POC and education on all   Nustep L2x6 minutes BLEs only seat 8  Bridges x10 Seated SLRs x10 B 0# STS + yellow TB around knees x10   11/05/23 Pt seen for aquatic therapy today.  Treatment took place in water  3.5-4.75 ft in depth at the [facility] pool. Temp of water  was 91.  Pt entered/exited the pool via lift (for safety Pt afraid of water ) with hand rail.  Exercises - Walking  - Standing 'L' Stretch at [employer]   - forward march with knee kick  - Heel Toe Raises at [facility]   - Kick Leg Out To The Side  - Standing March at [facility]   - Squat - Holding to pool wall bend knee towards buttocks   - Leg swings flex/ext     Pt requires the buoyancy and hydrostatic pressure of water  for support, and to offload joints by unweighting joint load by at least 50 % in navel deep water  and by at least 75-80% in chest to neck deep water .  Viscosity of  the water  is needed for resistance of strengthening. Water  current perturbations provides challenge to standing balance requiring increased core activation.     PATIENT EDUCATION:  Education details: POC, HEP Person educated: Patient Education method: Explanation Education comprehension: verbalized understanding  HOME EXERCISE PROGRAM: Access Code: [email or web address] URL: [email or web address] Date: 09/16/2023 Prepared by: Almetta Fam  Exercises - Sit to Stand  - 1 x daily - 7 x weekly - 2 sets - 10 reps - Seated Long Arc Quad  - 1 x daily - 7 x weekly - 2 sets - 10 reps - 3 hold - Heel Raises with Counter Support  - 1 x daily - 7 x weekly - 2 sets - 10 reps - Supine Lower Trunk Rotation  - 1 x daily - 7 x weekly - 2 sets - 10 reps - Supine Bridge  - 1 x daily - 7 x weekly - 2 sets - 10 reps   Aquatic This aquatic home exercise program from Med[employer] utilizes pictures from land based exercises, but has been adapted prior to lamination and issuance.   Access Code: [email or web address] URL:  [email or web address] Date: 11/08/2023 Prepared by: Frankie Ziemba  Exercises - Walking  - 1 x daily - 1-3 x weekly - 1-2 sets - 10 reps - Standing 'L' Stretch at [employer]  - 1 x daily - 1-3 x weekly - 1-2 sets - 10 reps - forward march with knee kick  - 1 x daily - 1-3 x weekly - Heel Toe Raises at Pool Wall  - 1 x daily - 1-3 x weekly - 1-2 sets - 10 reps - Kick Leg Out To The Side  - 1 x daily - 1-3 x weekly - 1-2 sets - 10 reps - Standing March at [facility]  - 1 x daily - 1-3 x weekly - 1-2 sets - 10 reps - Squat  - 1 x daily - 1-3 x weekly - 1-2 sets - 10 reps - Holding to pool wall bend knee towards buttocks  - 1 x daily - 1-3 x weekly - 1-2 sets - 10 reps - Leg swings flex/ext  - 1 x daily - 1-3 x weekly - 1-2 sets - 10 reps ASSESSMENT:  CLINICAL IMPRESSION:    Pt present for 20th visit. Objective measures taken last session and today. She continues to report an average pain of 5/10. Pain present with stair negotiation limiting her functional mobility with them. Fatigue with sit to stands.   Would continue to benefit from skilled PT to address the deficits found in order to improve tolerance to activities and ADLs.  Eval:Patient is a 68 y.o. female who was seen today for physical therapy evaluation and treatment for chronic knee pain. She has pain on both sides but it is mostly the L knee that bothers her. Patient reports walking increases her pain and she is unable to do steps going up or down with the LLE. She compensates with her RLE when walking and due to this it has started causing pain in her R lower back. She has had shots in the L knee and it seemed to help for a little bit but she still has pain mostly with any standing and walking activities. Patient will benefit from PT to address her L knee pain as well as the back pain to increase her activity tolerance and improve strength to allow ease with ADLs.  OBJECTIVE IMPAIRMENTS: Abnormal gait, decreased activity  tolerance, decreased balance, decreased endurance, difficulty walking, decreased strength,  postural dysfunction, and pain.   ACTIVITY LIMITATIONS: squatting, stairs, and locomotion level  PARTICIPATION LIMITATIONS: meal prep, cleaning, laundry, driving, shopping, community activity, and yard work  PERSONAL FACTORS: Age, Fitness, Past/current experiences, and Time since onset of injury/illness/exacerbation are also affecting patient's functional outcome.   REHAB POTENTIAL: Good  CLINICAL DECISION MAKING: Stable/uncomplicated  EVALUATION COMPLEXITY: Low  GOALS: Goals reviewed with patient? Yes  SHORT TERM GOALS: Target date: 10/28/23  Patient will be independent with initial HEP. Baseline:  Goal status: MET 10/14/23  2.  Patient will decrease TUG <14s Baseline: 22s Goal status: IN PROGRESS 11/11/23, Met 13 sec    LONG TERM GOALS: Target date: 12/09/23  Patient will be independent with advanced/ongoing HEP to improve outcomes and carryover.  Baseline:  Goal status: IN PROGRESS 10/14/23;11/08/23 (Met in aquatics)  2.  Patient will report at least 75% improvement in  knee pain to improve QOL. (Pain <4/10) Baseline: 8/10 Goal status: IN PROGRESS 11/11/23 50% per pt, Progressing 5/10 12/01/23  3.  Patient will demonstrate improved functional LE strength as demonstrated by 5xSTS <15s. Baseline: 24s Goal status: IN PROGRESS 11/11/23, Met 11/29/23 20.42 sec   4. Patient will be able to ascend/descend stairs with 1 HR and reciprocal step pattern safely to access home and community.  Baseline:  Goal status: IN PROGRESS 11/11/23, ongoing 12/01/23  5.  Patient will tolerate at least 30 min of standing and walking. Baseline: 5-10 mins Goal status: IN PROGRESS 11/11/23 maybe I could do 20, Partly Met I think I have done 30 min 12/01/23  PLAN:  PT FREQUENCY: 2x/week  PT DURATION: 12 weeks  PLANNED INTERVENTIONS: 97110-Therapeutic exercises, 97530- Therapeutic activity, 97112-  Neuromuscular re-education, 97535- Self Care, [id number]- Manual therapy, [phone]- Gait training, [phone]- Vasopneumatic device, [id number]- Ultrasound, [id number]- Ionotophoresis 4mg /ml Dexamethasone , [id number] (1-2 muscles), [id number] (3+ muscles)- Dry Needling, Patient/Family education, Balance training, Stair training, Taping, Joint mobilization, Joint manipulation, Spinal manipulation, Spinal mobilization, Cryotherapy, and Moist heat  PLAN FOR NEXT SESSION:   Progressive PREs, lumbar and hip mobility/flexibility, core, re-assess at end of month.  Tanda Sorrow, PTA 12/01/23 10:17 AM

## 2023-12-04 ENCOUNTER — Ambulatory Visit
Admission: EM | Admit: 2023-12-04 | Discharge: 2023-12-04 | Disposition: A | Discharge status: Home / Self Care | Attending: Emergency Medicine | Admitting: Emergency Medicine

## 2023-12-04 ENCOUNTER — Encounter: Payer: Self-pay | Admitting: Emergency Medicine

## 2023-12-04 DIAGNOSIS — J069 Acute upper respiratory infection, unspecified: Secondary | ICD-10-CM | POA: Diagnosis not present

## 2023-12-04 MED ORDER — GUAIFENESIN ER 600 MG PO TB12: 1200.0000 mg | ORAL_TABLET | Freq: Two times a day (BID) | ORAL | 0 refills | Status: AC

## 2023-12-04 MED ORDER — FLUTICASONE PROPIONATE 50 MCG/ACT NA SUSP: 2.0000 | Freq: Every day | NASAL | 2 refills | Status: AC

## 2023-12-04 NOTE — ED Provider Notes (Signed)
 [facility]    CSN: [id number] Arrival date & time: 12/04/23  1104      History   Chief Complaint Chief Complaint  Patient presents with   Nasal Congestion    HPI [name] is a 68 y.o. female.   2 day history of nasal congestion, sinus pressure, sneezing Some ear fullness. No ear pain, sore throat.  Not having fever or cough. Normal appetite. Reports does not drink much water . Usually lemonade.   Taking tylenol . Also used Claritin once.   Sick contacts reported. She has also been in several clinics recently for other appointments   CHF, DM, afib on eliquis    Past Medical History:  Diagnosis Date   Arthritis    Asthma    CHF (congestive heart failure) (HCC)    Coronary artery disease    Diabetes mellitus    Diabetic neuropathy (HCC)    Dysrhythmia    A.fib    takes Eliquis    GERD (gastroesophageal reflux disease)    History of hiatal hernia    History of kidney stones    Hx of cardiovascular stress test    Lexiscan  Myoview (10/15):  Medium area of ischemia in AL and IL distribution, cannot completely exclude shifting breast attenuation, EF 64%; Abnormal study   Hyperlipidemia    Hypertension    Mild carotid artery disease    Morbid obesity (HCC)    PAF (paroxysmal atrial fibrillation) (HCC)    Sleep apnea    does not wear CPAP   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   Syncope     Patient Active Problem List   Diagnosis Date Noted   Paroxysmal atrial fibrillation with RVR (HCC) 11/28/2020   Hypertensive urgency 11/28/2020   Syncope and collapse 11/28/2020   Nausea 11/28/2020   Hypokalemia 11/28/2020   Hyperglycemia due to diabetes mellitus (HCC) 11/28/2020   GERD (gastroesophageal reflux disease) 11/28/2020   Asthma 11/28/2020   Coronary atherosclerosis of native coronary artery 07/17/2013   Mixed hyperlipidemia 07/17/2013   Essential hypertension, benign 07/17/2013   Obesity 08/11/2011    Past Surgical History:  Procedure  Laterality Date   ACHILLES TENDON REPAIR  1995   right   BIOPSY  07/22/2021   Procedure: BIOPSY;  Surgeon: Saintclair Jasper, MD;  Location: [facility];  Service: Gastroenterology;;   CATARACT EXTRACTION Bilateral    COLONOSCOPY     COLONOSCOPY WITH PROPOFOL  N/A 07/22/2021   Procedure: COLONOSCOPY WITH PROPOFOL ;  Surgeon: Saintclair Jasper, MD;  Location: [facility];  Service: Gastroenterology;  Laterality: N/A;   CYSTOSCOPY W/ RETROGRADES Right 06/15/2023   Procedure: CYSTOSCOPY, WITH RETROGRADE PYELOGRAM;  Surgeon: Shane Steffan BROCKS, MD;  Location: [facility];  Service: Urology;  Laterality: Right;   CYSTOSCOPY/URETEROSCOPY/HOLMIUM LASER/STENT PLACEMENT Right 06/15/2023   Procedure: CYSTOSCOPY/URETEROSCOPY/HOLMIUM LASER/STENT PLACEMENT;  Surgeon: Shane Steffan BROCKS, MD;  Location: [facility];  Service: Urology;  Laterality: Right;   DILATION AND CURETTAGE OF UTERUS     ESOPHAGOGASTRODUODENOSCOPY (EGD) WITH PROPOFOL  N/A 07/22/2021   Procedure: ESOPHAGOGASTRODUODENOSCOPY (EGD) WITH PROPOFOL ;  Surgeon: Saintclair Jasper, MD;  Location: [facility];  Service: Gastroenterology;  Laterality: N/A;   EYE SURGERY Left    detached retina repair   HAND SURGERY Right    right  pinky finger   ORIF   heart stent  2007   PARS PLANA VITRECTOMY Left 03/24/2017   Procedure: PARS PLANA VITRECTOMY WITH 25 GAUGE;  Surgeon: Elner Arley LABOR, MD;  Location: [facility];  Service: Ophthalmology;  Laterality: Left;   POLYPECTOMY  07/22/2021   Procedure: POLYPECTOMY;  Surgeon: Saintclair Jasper, MD;  Location: [facility];  Service: Gastroenterology;;   UTERINE FIBROID SURGERY  2008    OB History   No obstetric history on file.      Home Medications    Prior to Admission medications   Medication Sig Start Date End Date Taking? Authorizing Provider  fluticasone  (FLONASE ) 50 MCG/ACT nasal spray Place 2 sprays into both nostrils daily. 12/04/23  Yes Bryn Perkin, [place], PA-C  guaiFENesin  (MUCINEX ) 600 MG 12 hr tablet Take 2 tablets (1,200 mg  total) by mouth 2 (two) times daily. 12/04/23  Yes Myra Weng, [place], PA-C  acetaminophen  (TYLENOL ) 500 MG tablet Take 1,000 mg by mouth every 6 (six) hours as needed (pain.).    [provider]  albuterol  (VENTOLIN  HFA) 108 (90 Base) MCG/ACT inhaler Inhale 1-2 puffs into the lungs every 6 (six) hours as needed for wheezing or shortness of breath. 04/16/21   Christopher Savannah, PA-C  amLODipine  (NORVASC ) 10 MG tablet TAKE 1 TABLET BY MOUTH ONCE DAILY. PLEASE KEEP UPCOMING APPOINTMENT IN FEBRUARY 2024 FOR FUTURE REFILLS. THANK YOU 07/09/23   Dann Candyce RAMAN, MD  apixaban  (ELIQUIS ) 5 MG TABS tablet Take 1 tablet by mouth twice daily 07/13/23   Lonni Slain, MD  aspirin  EC 81 MG tablet Take 1 tablet (81 mg total) by mouth daily. 03/03/19   Dann Candyce RAMAN, MD  BREO ELLIPTA  100-25 MCG/INH AEPB Inhale 1 puff into the lungs in the morning. 09/21/16   [provider]  carvedilol  (COREG ) 6.25 MG tablet Take 1 tablet by mouth twice daily 09/29/22   Varanasi, Jayadeep S, MD  Cholecalciferol (VITAMIN D3) 25 MCG (1000 UT) CAPS 1 capsule.    [provider]  colestipol (COLESTID) 1 g tablet Take 2 g by mouth daily in the afternoon. 05/24/23   [provider]  Cyanocobalamin (VITAMIN B12) 1000 MCG TABS 1 tablet Orally Once a day    [provider]  escitalopram (LEXAPRO) 5 MG tablet Take 5 mg by mouth daily at 8 pm. 11/22/20   [provider]  ezetimibe  (ZETIA ) 10 MG tablet Take 1 tablet by mouth once daily 09/29/22   Dann Candyce RAMAN, MD  FREESTYLE LITE test strip 1 each by Other route as directed.  07/08/11   [provider]  furosemide  (LASIX ) 40 MG tablet Take 2 tablets (80 mg total) by mouth daily. 06/01/22   Dann Candyce RAMAN, MD  HUMULIN R  U-500 KWIKPEN 500 UNIT/ML KwikPen Inject 30-80 Units into the skin See admin instructions. INJECT 80 UNITS SUBCUTANEOUSLY BEFORE BREAKFAST AND 30 UNITS BEFORE EVENING MEAL TWICE DAILY 30 MINUTES BEFORE  MEALS 04/30/23   [provider]  hyoscyamine  (ANASPAZ ) 0.125 MG TBDP disintergrating tablet Place 1 tablet (0.125 mg total) under the tongue every 6 (six) hours as needed for up to 20 doses. 06/15/23   Shane Steffan BROCKS, MD  lisinopril  (ZESTRIL ) 40 MG tablet Take 1 tablet by mouth once daily 06/10/23   Lonni Slain, MD  methocarbamol  (ROBAXIN ) 750 MG tablet Take 750 mg by mouth 4 (four) times daily as needed (pain/spasms). 05/31/23   [provider]  Multiple Vitamin (MULTI VITAMIN) TABS 1 tablet Orally Once a day    [provider]  nitroGLYCERIN  (NITROSTAT ) 0.4 MG SL tablet Place 1 tablet (0.4 mg total) under the tongue every 5 (five) minutes as needed for chest pain (X 3 DOSES FOR CHEST PAIN). 09/29/22   Dann Candyce RAMAN, MD  OZEMPIC, 2 MG/DOSE, 8  MG/3ML SOPN Inject 2 mg into the skin every Sunday. 05/25/23   [provider]  potassium chloride  (KLOR-CON ) 10 MEQ tablet Take 1 tablet (10 mEq total) by mouth in the morning and at bedtime. 10/12/23   Lonni Slain, MD  Povidone, PF, (IVIZIA DRY EYES) 0.5 % SOLN Place 1 drop into both eyes daily.    [provider]  rosuvastatin  (CRESTOR ) 40 MG tablet Take 1 tablet by mouth once daily 07/09/23   Lonni Slain, MD  tamsulosin  (FLOMAX ) 0.4 MG CAPS capsule Take 1 capsule (0.4 mg total) by mouth daily after supper. 06/15/23   Shane Steffan BROCKS, MD    Family History Family History  Problem Relation Age of Onset   Heart disease Mother    Kidney disease Father    Stroke Father    Heart attack Father    Heart disease Brother    Heart disease Brother    Stroke Maternal Grandfather    Stroke Paternal Grandfather    Asthma Other    Hypertension Other    Diabetes Other     Social History Social History   Tobacco Use   Smoking status: Former    Current packs/day: 0.00    Types: Cigarettes    Quit date: 08/07/1982    Years since quitting: 41.3   Smokeless tobacco: Never   Vaping Use   Vaping status: Never Used  Substance Use Topics   Alcohol use: No   Drug use: No     Allergies   Bystolic [nebivolol hcl], Crestor  [rosuvastatin ], Other, Sulfa antibiotics, Bystolic [nebivolol hcl], Invokana [canagliflozin], Januvia [sitagliptin], Jardiance [empagliflozin], Keflex  [cephalexin ], Metformin and related, and Mobic [meloxicam]   Review of Systems Review of Systems  As per HPI  Physical Exam Triage Vital Signs ED Triage Vitals  Encounter Vitals Group     BP 12/04/23 1213 (!) 153/83     Girls Systolic BP Percentile --      Girls Diastolic BP Percentile --      Boys Systolic BP Percentile --      Boys Diastolic BP Percentile --      Pulse Rate 12/04/23 1213 85     Resp 12/04/23 1213 16     Temp 12/04/23 1213 98 F (36.7 C)     Temp Source 12/04/23 1213 Oral     SpO2 12/04/23 1213 95 %     Weight --      Height --      Head Circumference --      Peak Flow --      Pain Score 12/04/23 1216 5     Pain Loc --      Pain Education --      Exclude from Growth Chart --    No data found.  Updated Vital Signs BP (!) 153/83 (BP Location: Right Arm)   Pulse 85   Temp 98 F (36.7 C) (Oral)   Resp 16   LMP  (LMP Unknown)   SpO2 95%   Visual Acuity Right Eye Distance:   Left Eye Distance:   Bilateral Distance:    Right Eye Near:   Left Eye Near:    Bilateral Near:     Physical Exam Vitals and nursing note reviewed.  Constitutional:      General: She is not in acute distress.    Appearance: She is not ill-appearing or diaphoretic.  HENT:     Right Ear: Tympanic membrane and ear canal normal.     Left Ear: Tympanic  membrane and ear canal normal.     Nose: No nasal deformity, signs of injury or rhinorrhea.     Right Turbinates: Not swollen.     Left Turbinates: Not swollen.     Mouth/Throat:     Mouth: Mucous membranes are moist.     Pharynx: Oropharynx is clear. No posterior oropharyngeal erythema.  Eyes:     Conjunctiva/sclera:  Conjunctivae normal.  Cardiovascular:     Rate and Rhythm: Normal rate and regular rhythm.     Pulses: Normal pulses.     Heart sounds: Normal heart sounds.  Pulmonary:     Effort: Pulmonary effort is normal.     Breath sounds: Normal breath sounds.  Abdominal:     Palpations: Abdomen is soft.     Tenderness: There is no abdominal tenderness.     Comments: Habitus limits exam  Musculoskeletal:     Cervical back: Normal range of motion. No rigidity or tenderness.  Lymphadenopathy:     Cervical: No cervical adenopathy.  Skin:    General: Skin is warm and dry.  Neurological:     Mental Status: She is alert and oriented to person, place, and time.     Sensory: No sensory deficit.     Motor: No weakness.     Coordination: Coordination normal.     Gait: Gait normal.      UC Treatments / Results  Labs (all labs ordered are listed, but only abnormal results are displayed) Labs Reviewed - No data to display  EKG   Radiology No results found.  Procedures Procedures (including critical care time)  Medications Ordered in UC Medications - No data to display  Initial Impression / Assessment and Plan / UC Course  I have reviewed the triage vital signs and the nursing notes.  Pertinent labs & imaging results that were available during my care of the patient were reviewed by me and considered in my medical decision making (see chart for details).  Afebrile and well appearing 2-3 days of nasal congestion, without fever or cough Discussed viral etiology. Supportive care. Recommend guaifenesin , flonase , night time claritin, continue tylenol , increase fluids.  Monitoring of symptoms. Can return if not improving in 4-5 days despite interventions. All questions answered Discussed antibiotic is not appropriate at this time  Final Clinical Impressions(s) / UC Diagnoses   Final diagnoses:  Viral URI     Discharge Instructions      Guaifenesin  (plain mucinex ) twice daily This  only works if you are hydrated. Drink LOTS of water . A minimum of 80 oz daily.   Flonase  nasal spray -- one spray in each nostril twice a day   Start taking your claritin at night time consistently  Please note these medications are available over the counter if the prescription is not affordable.   If not improving in 4-5 days with these interventions, please return      ED Prescriptions     Medication Sig Dispense Auth. Provider   guaiFENesin  (MUCINEX ) 600 MG 12 hr tablet Take 2 tablets (1,200 mg total) by mouth 2 (two) times daily. 30 tablet Antionette Luster, PA-C   fluticasone  (FLONASE ) 50 MCG/ACT nasal spray Place 2 sprays into both nostrils daily. 9.9 mL Kaysie Michelini, [place], PA-C      PDMP not reviewed this encounter.   Jeryl [place]berry, [place] 12/04/23 [id number]

## 2023-12-04 NOTE — ED Triage Notes (Signed)
 Pt c/o sinus pressure, sneezing, and congestion for two days.

## 2023-12-04 NOTE — Discharge Instructions (Addendum)
 Guaifenesin  (plain mucinex ) twice daily This only works if you are hydrated. Drink LOTS of water . A minimum of 80 oz daily.   Flonase  nasal spray -- one spray in each nostril twice a day   Start taking your claritin at night time consistently  Please note these medications are available over the counter if the prescription is not affordable.   If not improving in 4-5 days with these interventions, please return

## 2023-12-06 ENCOUNTER — Encounter: Payer: Self-pay | Admitting: Physical Therapy

## 2023-12-06 ENCOUNTER — Ambulatory Visit: Admitting: Physical Therapy

## 2023-12-06 DIAGNOSIS — M25562 Pain in left knee: Secondary | ICD-10-CM | POA: Diagnosis not present

## 2023-12-06 DIAGNOSIS — G8929 Other chronic pain: Secondary | ICD-10-CM

## 2023-12-06 DIAGNOSIS — M5459 Other low back pain: Secondary | ICD-10-CM

## 2023-12-06 NOTE — Therapy (Signed)
 OUTPATIENT PHYSICAL THERAPY LOWER EXTREMITY TREATMENT NOTE   Progress Note Reporting Period 10/27/23 to 12/01/23 for visits 11-20  See note below for Objective Data and Assessment of Progress/Goals.     Patient Name: [name] MRN: [id number] DOB:06-03-1955, 68 y.o., female Today's Date: 12/06/2023     END OF SESSION:  PT End of Session - 12/06/23 1016     Visit Number 21    Date for Recertification  12/09/23    PT Start Time 1015    PT Stop Time 1100    PT Time Calculation (min) 45 min    Activity Tolerance Patient tolerated treatment well    Behavior During Therapy WFL for tasks assessed/performed                    Past Medical History:  Diagnosis Date   Arthritis    Asthma    CHF (congestive heart failure) (HCC)    Coronary artery disease    Diabetes mellitus    Diabetic neuropathy (HCC)    Dysrhythmia    A.fib    takes Eliquis    GERD (gastroesophageal reflux disease)    History of hiatal hernia    History of kidney stones    Hx of cardiovascular stress test    Lexiscan  Myoview (10/15):  Medium area of ischemia in AL and IL distribution, cannot completely exclude shifting breast attenuation, EF 64%; Abnormal study   Hyperlipidemia    Hypertension    Mild carotid artery disease    Morbid obesity (HCC)    PAF (paroxysmal atrial fibrillation) (HCC)    Sleep apnea    does not wear CPAP   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   Syncope    Past Surgical History:  Procedure Laterality Date   ACHILLES TENDON REPAIR  1995   right   BIOPSY  07/22/2021   Procedure: BIOPSY;  Surgeon: Saintclair Jasper, MD;  Location: [facility];  Service: Gastroenterology;;   CATARACT EXTRACTION Bilateral    COLONOSCOPY     COLONOSCOPY WITH PROPOFOL  N/A 07/22/2021   Procedure: COLONOSCOPY WITH PROPOFOL ;  Surgeon: Saintclair Jasper, MD;  Location: [facility];  Service: Gastroenterology;  Laterality: N/A;   CYSTOSCOPY W/ RETROGRADES Right 06/15/2023   Procedure: CYSTOSCOPY,  WITH RETROGRADE PYELOGRAM;  Surgeon: Shane Steffan BROCKS, MD;  Location: [facility];  Service: Urology;  Laterality: Right;   CYSTOSCOPY/URETEROSCOPY/HOLMIUM LASER/STENT PLACEMENT Right 06/15/2023   Procedure: CYSTOSCOPY/URETEROSCOPY/HOLMIUM LASER/STENT PLACEMENT;  Surgeon: Shane Steffan BROCKS, MD;  Location: [facility];  Service: Urology;  Laterality: Right;   DILATION AND CURETTAGE OF UTERUS     ESOPHAGOGASTRODUODENOSCOPY (EGD) WITH PROPOFOL  N/A 07/22/2021   Procedure: ESOPHAGOGASTRODUODENOSCOPY (EGD) WITH PROPOFOL ;  Surgeon: Saintclair Jasper, MD;  Location: [facility];  Service: Gastroenterology;  Laterality: N/A;   EYE SURGERY Left    detached retina repair   HAND SURGERY Right    right  pinky finger   ORIF   heart stent  2007   PARS PLANA VITRECTOMY Left 03/24/2017   Procedure: PARS PLANA VITRECTOMY WITH 25 GAUGE;  Surgeon: Elner Arley LABOR, MD;  Location: [facility];  Service: Ophthalmology;  Laterality: Left;   POLYPECTOMY  07/22/2021   Procedure: POLYPECTOMY;  Surgeon: Saintclair Jasper, MD;  Location: [facility];  Service: Gastroenterology;;   UTERINE FIBROID SURGERY  2008   Patient Active Problem List   Diagnosis Date Noted   Paroxysmal atrial fibrillation with RVR (HCC) 11/28/2020   Hypertensive urgency 11/28/2020   Syncope and collapse 11/28/2020   Nausea 11/28/2020  Hypokalemia 11/28/2020   Hyperglycemia due to diabetes mellitus (HCC) 11/28/2020   GERD (gastroesophageal reflux disease) 11/28/2020   Asthma 11/28/2020   Coronary atherosclerosis of native coronary artery 07/17/2013   Mixed hyperlipidemia 07/17/2013   Essential hypertension, benign 07/17/2013   Obesity 08/11/2011    PCP: Alberta Sharps  REFERRING PROVIDER: Marcey Her  REFERRING DIAG: Pain in L knee  THERAPY DIAG:  Bilateral chronic knee pain  Chronic pain of left knee  Other low back pain  Rationale for Evaluation and Treatment: Rehabilitation  ONSET DATE: chronic  SUBJECTIVE:   SUBJECTIVE STATEMENT:  Im  hurting hurting in the L knee, R hip and across her shoulders. Pain started friday  EVAL: My knees hurt, the left one more. It is to the point where I can't step up or down on the L side. It has started making the back on the R side hurt now. They put a shot in it in June, it doesn't hurt like it did but I still don't have use of it.   PERTINENT HISTORY: Arthritis, CHF, DM, CAD, PAF, Stroke in 2022  PAIN:  Are you having pain? Yes: NPRS scale: 8/10 Pain location: L knee  Pain description: just hurts  Aggravating factors: walking, sitting or laying for too long and then trying to get up, depends on position, sometimes has no rhythm to it  Relieving factors: finding the right position but making sure I still move enough   PRECAUTIONS: None  RED FLAGS: None   WEIGHT BEARING RESTRICTIONS: No  FALLS:  Has patient fallen in last 6 months? No  LIVING ENVIRONMENT: Lives with: lives alone Lives in: House/apartment Stairs: Yes: Internal: steps into the basement steps; on left going up and External: 2 steps; on left going up   OCCUPATION: retired from social services   PLOF: Independent and Independent with basic ADLs  PATIENT GOALS:  to get rid of the pain and be able to walk and move again   NEXT MD VISIT:   OBJECTIVE:  Note: Objective measures were completed at Evaluation unless otherwise noted.  DIAGNOSTIC FINDINGS:   COGNITION: Overall cognitive status: Within functional limits for tasks assessed     SENSATION: WFL   MUSCLE LENGTH: Hamstrings: some tightness in BLE  POSTURE: rounded shoulders  LOWER EXTREMITY ROM: WFL Some pain with hip flexion on both sides in R side LBP Some pain with end range flexion on L knee  LOWER EXTREMITY MMT:  MMT Right eval Left eval 10/2 R  10/2 L  Right 12/01/23 Left 12/01/23  Hip flexion 4- 5 4+ 4+ 5 5  Hip extension        Hip abduction        Hip adduction        Hip internal rotation        Hip external rotation         Knee flexion 4+ 4- 5 4+ pain  5 4+  Knee extension 4+ 4+ 5 4- pain  5 4- p!  Ankle dorsiflexion        Ankle plantarflexion        Ankle inversion        Ankle eversion         (Blank rows = not tested)   FUNCTIONAL TESTS:  5 times sit to stand: 24s; 10/14/23 26.8 seconds no UEs; 11/11/23- 19.8 seconds no UEs   Timed up and go (TUG): 22s; 10/14/23 17 seconds; 11/11/23 16.8 seconds no device    GAIT:  Distance walked: in clinic distances Assistive device utilized: None Level of assistance: Complete Independence and Modified independence Comments: antalgic gait, weight shifting on the RLE, taking quick steps with LLE to avoid using it and weight bearing                                                                                                                                 TREATMENT DATE:  12/06/23 HS curls 20lb 2x10 Leg Ext lb 2x10  NuStep L5 x 6 min 20lb resisted side steps x5 each Sit to stands x10 holding red ball   12/01/23 NuStep L 5 x 7 min  Goals  Stairs training 2 rails 4in alt pattern, L knee weakness and pain Sit to stands 2x10 holding red ball  HS curls 20lb 2x10   11/29/23 NuStep L 5 x 6 min 5X S2S 20.42 sec TUG 13 sec 20lb Resisted gait 4 way x 3 each  3# LAQ 2 sets 10 Standing march w/ 3lb Cuff 2x10   11/22/23:  Nustep lvl 5 for 6 min Seated Clamshells with Yellow Theraband 2 x 12 Seated Ball Squeezes 2 x 12 Seated LAQ with 2 lb AW x 10 each side Seated Marches with 2lbs x 10 each side Cable Pulley Shoulder Extension 5 lbs 2 x 10  Cable Pulley Biceps Curl 5 lbs 2 x 10 Seated Pelvic Tilts  11/18/23: Supine PPT x 10 with 5 second holds Supine Pball HS Curls 2 x 10 Seated Pball Abdominals 2 x 10 with 3 second holds Supine Pball Trunk Rotation x 10 each way Standing resisted Hip flx, abd, and ext in // bars with yellow tband 3 x 5 each way for SL balance Nustep Lvl 5 for 6 min  11/16/23 3# LAQ 2 sets 10 3# hip flex and abd seated 2 sets  10 Green tband HS curl 2 sets 10 STS 10 x with yellow wt ball Nustep L 5 6 min 20# resisted giat 5 x fwd and backward, 23 x each side   11/11/23  TUG, MMT, 5xSTS, stair assessment, goals, POC and education on all   Nustep L2x6 minutes BLEs only seat 8  Bridges x10 Seated SLRs x10 B 0# STS + yellow TB around knees x10   11/05/23 Pt seen for aquatic therapy today.  Treatment took place in water  3.5-4.75 ft in depth at the [facility] pool. Temp of water  was 91.  Pt entered/exited the pool via lift (for safety Pt afraid of water ) with hand rail.  Exercises - Walking  - Standing 'L' Stretch at [employer]   - forward march with knee kick  - Heel Toe Raises at [facility]   - Kick Leg Out To The Side  - Standing March at [facility]   - Squat - Holding to pool wall bend knee towards buttocks   - Leg swings flex/ext     Pt requires the buoyancy and hydrostatic pressure  of water  for support, and to offload joints by unweighting joint load by at least 50 % in navel deep water  and by at least 75-80% in chest to neck deep water .  Viscosity of the water  is needed for resistance of strengthening. Water  current perturbations provides challenge to standing balance requiring increased core activation.     PATIENT EDUCATION:  Education details: POC, HEP Person educated: Patient Education method: Explanation Education comprehension: verbalized understanding  HOME EXERCISE PROGRAM: Access Code: [email or web address] URL: [email or web address] Date: 09/16/2023 Prepared by: Almetta Fam  Exercises - Sit to Stand  - 1 x daily - 7 x weekly - 2 sets - 10 reps - Seated Long Arc Quad  - 1 x daily - 7 x weekly - 2 sets - 10 reps - 3 hold - Heel Raises with Counter Support  - 1 x daily - 7 x weekly - 2 sets - 10 reps - Supine Lower Trunk Rotation  - 1 x daily - 7 x weekly - 2 sets - 10 reps - Supine Bridge  - 1 x daily - 7 x weekly - 2 sets - 10 reps   Aquatic This aquatic home  exercise program from Med[employer] utilizes pictures from land based exercises, but has been adapted prior to lamination and issuance.   Access Code: [email or web address] URL: [email or web address] Date: 11/08/2023 Prepared by: Frankie Ziemba  Exercises - Walking  - 1 x daily - 1-3 x weekly - 1-2 sets - 10 reps - Standing 'L' Stretch at [employer]  - 1 x daily - 1-3 x weekly - 1-2 sets - 10 reps - forward march with knee kick  - 1 x daily - 1-3 x weekly - Heel Toe Raises at Pool Wall  - 1 x daily - 1-3 x weekly - 1-2 sets - 10 reps - Kick Leg Out To The Side  - 1 x daily - 1-3 x weekly - 1-2 sets - 10 reps - Standing March at [facility]  - 1 x daily - 1-3 x weekly - 1-2 sets - 10 reps - Squat  - 1 x daily - 1-3 x weekly - 1-2 sets - 10 reps - Holding to pool wall bend knee towards buttocks  - 1 x daily - 1-3 x weekly - 1-2 sets - 10 reps - Leg swings flex/ext  - 1 x daily - 1-3 x weekly - 1-2 sets - 10 reps ASSESSMENT:  CLINICAL IMPRESSION:    Pt enters with LE and shoulder pain that started last week. Increase fatigue today with all functional activities. Rest given as needed. CGA needed with resisted side steps. Cue for full Ext needed with leg extensions.   Would continue to benefit from skilled PT to address the deficits found in order to improve tolerance to activities and ADLs.  Eval:Patient is a 68 y.o. female who was seen today for physical therapy evaluation and treatment for chronic knee pain. She has pain on both sides but it is mostly the L knee that bothers her. Patient reports walking increases her pain and she is unable to do steps going up or down with the LLE. She compensates with her RLE when walking and due to this it has started causing pain in her R lower back. She has had shots in the L knee and it seemed to help for a little bit but she still has pain mostly with any standing and walking activities. Patient will benefit from PT to address her L knee pain as well  as the back  pain to increase her activity tolerance and improve strength to allow ease with ADLs.  OBJECTIVE IMPAIRMENTS: Abnormal gait, decreased activity tolerance, decreased balance, decreased endurance, difficulty walking, decreased strength, postural dysfunction, and pain.   ACTIVITY LIMITATIONS: squatting, stairs, and locomotion level  PARTICIPATION LIMITATIONS: meal prep, cleaning, laundry, driving, shopping, community activity, and yard work  PERSONAL FACTORS: Age, Fitness, Past/current experiences, and Time since onset of injury/illness/exacerbation are also affecting patient's functional outcome.   REHAB POTENTIAL: Good  CLINICAL DECISION MAKING: Stable/uncomplicated  EVALUATION COMPLEXITY: Low  GOALS: Goals reviewed with patient? Yes  SHORT TERM GOALS: Target date: 10/28/23  Patient will be independent with initial HEP. Baseline:  Goal status: MET 10/14/23  2.  Patient will decrease TUG <14s Baseline: 22s Goal status: IN PROGRESS 11/11/23, Met 13 sec    LONG TERM GOALS: Target date: 12/09/23  Patient will be independent with advanced/ongoing HEP to improve outcomes and carryover.  Baseline:  Goal status: IN PROGRESS 10/14/23;11/08/23 (Met in aquatics)  2.  Patient will report at least 75% improvement in  knee pain to improve QOL. (Pain <4/10) Baseline: 8/10 Goal status: IN PROGRESS 11/11/23 50% per pt, Progressing 5/10 12/01/23  3.  Patient will demonstrate improved functional LE strength as demonstrated by 5xSTS <15s. Baseline: 24s Goal status: IN PROGRESS 11/11/23, Met 11/29/23 20.42 sec   4. Patient will be able to ascend/descend stairs with 1 HR and reciprocal step pattern safely to access home and community.  Baseline:  Goal status: IN PROGRESS 11/11/23, ongoing 12/01/23  5.  Patient will tolerate at least 30 min of standing and walking. Baseline: 5-10 mins Goal status: IN PROGRESS 11/11/23 maybe I could do 20, Partly Met I think I have done 30 min  12/01/23  PLAN:  PT FREQUENCY: 2x/week  PT DURATION: 12 weeks  PLANNED INTERVENTIONS: 97110-Therapeutic exercises, 97530- Therapeutic activity, 97112- Neuromuscular re-education, 97535- Self Care, [id number]- Manual therapy, [phone]- Gait training, [phone]- Vasopneumatic device, [id number]- Ultrasound, [id number]- Ionotophoresis 4mg /ml Dexamethasone , [id number] (1-2 muscles), [id number] (3+ muscles)- Dry Needling, Patient/Family education, Balance training, Stair training, Taping, Joint mobilization, Joint manipulation, Spinal manipulation, Spinal mobilization, Cryotherapy, and Moist heat  PLAN FOR NEXT SESSION:   Progressive PREs, lumbar and hip mobility/flexibility, core, re-assess at end of month.  Tanda Sorrow, PTA 12/06/23 10:16 AM

## 2023-12-09 ENCOUNTER — Ambulatory Visit

## 2023-12-09 DIAGNOSIS — M25562 Pain in left knee: Secondary | ICD-10-CM | POA: Diagnosis not present

## 2023-12-09 DIAGNOSIS — G8929 Other chronic pain: Secondary | ICD-10-CM

## 2023-12-09 DIAGNOSIS — M5459 Other low back pain: Secondary | ICD-10-CM

## 2023-12-09 NOTE — Therapy (Signed)
 OUTPATIENT PHYSICAL THERAPY LOWER EXTREMITY TREATMENT & UPOC NOTE    Patient Name: [name] MRN: [id number] DOB:07/31/1955, 68 y.o., female Today's Date: 12/09/2023     END OF SESSION:  PT End of Session - 12/09/23 1058     Visit Number 22    Date for Recertification  02/03/24    PT Start Time 1100    PT Stop Time 1138    PT Time Calculation (min) 38 min    Activity Tolerance Patient tolerated treatment well;Patient limited by fatigue;Patient limited by pain    Behavior During Therapy [facility] for tasks assessed/performed           Past Medical History:  Diagnosis Date   Arthritis    Asthma    CHF (congestive heart failure) (HCC)    Coronary artery disease    Diabetes mellitus    Diabetic neuropathy (HCC)    Dysrhythmia    A.fib    takes Eliquis    GERD (gastroesophageal reflux disease)    History of hiatal hernia    History of kidney stones    Hx of cardiovascular stress test    Lexiscan  Myoview (10/15):  Medium area of ischemia in AL and IL distribution, cannot completely exclude shifting breast attenuation, EF 64%; Abnormal study   Hyperlipidemia    Hypertension    Mild carotid artery disease    Morbid obesity (HCC)    PAF (paroxysmal atrial fibrillation) (HCC)    Sleep apnea    does not wear CPAP   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   Syncope    Past Surgical History:  Procedure Laterality Date   ACHILLES TENDON REPAIR  1995   right   BIOPSY  07/22/2021   Procedure: BIOPSY;  Surgeon: Saintclair Jasper, MD;  Location: [facility];  Service: Gastroenterology;;   CATARACT EXTRACTION Bilateral    COLONOSCOPY     COLONOSCOPY WITH PROPOFOL  N/A 07/22/2021   Procedure: COLONOSCOPY WITH PROPOFOL ;  Surgeon: Saintclair Jasper, MD;  Location: [facility];  Service: Gastroenterology;  Laterality: N/A;   CYSTOSCOPY W/ RETROGRADES Right 06/15/2023   Procedure: CYSTOSCOPY, WITH RETROGRADE PYELOGRAM;  Surgeon: Shane Steffan BROCKS, MD;  Location: [facility];  Service: Urology;   Laterality: Right;   CYSTOSCOPY/URETEROSCOPY/HOLMIUM LASER/STENT PLACEMENT Right 06/15/2023   Procedure: CYSTOSCOPY/URETEROSCOPY/HOLMIUM LASER/STENT PLACEMENT;  Surgeon: Shane Steffan BROCKS, MD;  Location: [facility];  Service: Urology;  Laterality: Right;   DILATION AND CURETTAGE OF UTERUS     ESOPHAGOGASTRODUODENOSCOPY (EGD) WITH PROPOFOL  N/A 07/22/2021   Procedure: ESOPHAGOGASTRODUODENOSCOPY (EGD) WITH PROPOFOL ;  Surgeon: Saintclair Jasper, MD;  Location: [facility];  Service: Gastroenterology;  Laterality: N/A;   EYE SURGERY Left    detached retina repair   HAND SURGERY Right    right  pinky finger   ORIF   heart stent  2007   PARS PLANA VITRECTOMY Left 03/24/2017   Procedure: PARS PLANA VITRECTOMY WITH 25 GAUGE;  Surgeon: Elner Arley LABOR, MD;  Location: [facility];  Service: Ophthalmology;  Laterality: Left;   POLYPECTOMY  07/22/2021   Procedure: POLYPECTOMY;  Surgeon: Saintclair Jasper, MD;  Location: [facility];  Service: Gastroenterology;;   UTERINE FIBROID SURGERY  2008   Patient Active Problem List   Diagnosis Date Noted   Paroxysmal atrial fibrillation with RVR (HCC) 11/28/2020   Hypertensive urgency 11/28/2020   Syncope and collapse 11/28/2020   Nausea 11/28/2020   Hypokalemia 11/28/2020   Hyperglycemia due to diabetes mellitus (HCC) 11/28/2020   GERD (gastroesophageal reflux disease) 11/28/2020   Asthma 11/28/2020  Coronary atherosclerosis of native coronary artery 07/17/2013   Mixed hyperlipidemia 07/17/2013   Essential hypertension, benign 07/17/2013   Obesity 08/11/2011    PCP: Alberta Sharps  REFERRING PROVIDER: Marcey Her  REFERRING DIAG: Pain in L knee  THERAPY DIAG:  Bilateral chronic knee pain  Chronic pain of left knee  Other low back pain  Rationale for Evaluation and Treatment: Rehabilitation  ONSET DATE: chronic  SUBJECTIVE:   SUBJECTIVE STATEMENT:  Pt reports doing well. Got a shot yesterday. Looking to get gel for her knees soon.   EVAL: My knees hurt,  the left one more. It is to the point where I can't step up or down on the L side. It has started making the back on the R side hurt now. They put a shot in it in June, it doesn't hurt like it did but I still don't have use of it.   PERTINENT HISTORY: Arthritis, CHF, DM, CAD, PAF, Stroke in 2022  PAIN:  Are you having pain? Yes: NPRS scale: 8/10 Pain location: L knee  Pain description: just hurts  Aggravating factors: walking, sitting or laying for too long and then trying to get up, depends on position, sometimes has no rhythm to it  Relieving factors: finding the right position but making sure I still move enough   PRECAUTIONS: None  RED FLAGS: None   WEIGHT BEARING RESTRICTIONS: No  FALLS:  Has patient fallen in last 6 months? No  LIVING ENVIRONMENT: Lives with: lives alone Lives in: House/apartment Stairs: Yes: Internal: steps into the basement steps; on left going up and External: 2 steps; on left going up   OCCUPATION: retired from social services   PLOF: Independent and Independent with basic ADLs  PATIENT GOALS:  to get rid of the pain and be able to walk and move again   NEXT MD VISIT:   OBJECTIVE:  Note: Objective measures were completed at Evaluation unless otherwise noted.  DIAGNOSTIC FINDINGS:   COGNITION: Overall cognitive status: Within functional limits for tasks assessed     SENSATION: WFL   MUSCLE LENGTH: Hamstrings: some tightness in BLE  POSTURE: rounded shoulders  LOWER EXTREMITY ROM: WFL Some pain with hip flexion on both sides in R side LBP Some pain with end range flexion on L knee  LOWER EXTREMITY MMT:  MMT Right eval Left eval 10/2 R  10/2 L  Right 12/01/23 Left 12/01/23  Hip flexion 4- 5 4+ 4+ 5 5  Hip extension        Hip abduction        Hip adduction        Hip internal rotation        Hip external rotation        Knee flexion 4+ 4- 5 4+ pain  5 4+  Knee extension 4+ 4+ 5 4- pain  5 4- p!  Ankle dorsiflexion         Ankle plantarflexion        Ankle inversion        Ankle eversion         (Blank rows = not tested)   FUNCTIONAL TESTS:  5 times sit to stand: 24s; 10/14/23 26.8 seconds no UEs; 11/11/23- 19.8 seconds no UEs   Timed up and go (TUG): 22s; 10/14/23 17 seconds; 11/11/23 16.8 seconds no device    GAIT: Distance walked: in clinic distances Assistive device utilized: None Level of assistance: Complete Independence and Modified independence Comments: antalgic gait, weight shifting on  the RLE, taking quick steps with LLE to avoid using it and weight bearing                                                                                                                                 TREATMENT DATE:  12/09/23 Nustep Lvl 5 for 6 min Circuit 1: (2 rounds) - Seated Marches with 2.5# AW x 10 each - Seated LAQ with 2.5# AW x 8 each - Seated Ball Squeeze x 12 5xSTS - 24.56 seconds Circuit 2: (2 rounds) - Seated Rows with green tband x 8 - Seated Shoulder ER with yellow tband x 10 - Ambulate around the clinic 150 ft  12/06/23 HS curls 20lb 2x10 Leg Ext lb 2x10  NuStep L5 x 6 min 20lb resisted side steps x5 each Sit to stands x10 holding red ball   12/01/23 NuStep L 5 x 7 min  Goals  Stairs training 2 rails 4in alt pattern, L knee weakness and pain Sit to stands 2x10 holding red ball  HS curls 20lb 2x10   11/29/23 NuStep L 5 x 6 min 5X S2S 20.42 sec TUG 13 sec 20lb Resisted gait 4 way x 3 each  3# LAQ 2 sets 10 Standing march w/ 3lb Cuff 2x10   11/22/23:  Nustep lvl 5 for 6 min Seated Clamshells with Yellow Theraband 2 x 12 Seated Ball Squeezes 2 x 12 Seated LAQ with 2 lb AW x 10 each side Seated Marches with 2lbs x 10 each side Cable Pulley Shoulder Extension 5 lbs 2 x 10  Cable Pulley Biceps Curl 5 lbs 2 x 10 Seated Pelvic Tilts  11/18/23: Supine PPT x 10 with 5 second holds Supine Pball HS Curls 2 x 10 Seated Pball Abdominals 2 x 10 with 3 second holds Supine Pball  Trunk Rotation x 10 each way Standing resisted Hip flx, abd, and ext in // bars with yellow tband 3 x 5 each way for SL balance Nustep Lvl 5 for 6 min  11/16/23 3# LAQ 2 sets 10 3# hip flex and abd seated 2 sets 10 Green tband HS curl 2 sets 10 STS 10 x with yellow wt ball Nustep L 5 6 min 20# resisted giat 5 x fwd and backward, 23 x each side   11/11/23  TUG, MMT, 5xSTS, stair assessment, goals, POC and education on all   Nustep L2x6 minutes BLEs only seat 8  Bridges x10 Seated SLRs x10 B 0# STS + yellow TB around knees x10   11/05/23 Pt seen for aquatic therapy today.  Treatment took place in water  3.5-4.75 ft in depth at the [facility] pool. Temp of water  was 91.  Pt entered/exited the pool via lift (for safety Pt afraid of water ) with hand rail.  Exercises - Walking  - Standing 'L' Stretch at [employer]   - forward march with knee kick  - Heel Toe Raises at [facility]   -  Kick Leg Out To The Side  - Standing March at [facility]   - Squat - Holding to pool wall bend knee towards buttocks   - Leg swings flex/ext     Pt requires the buoyancy and hydrostatic pressure of water  for support, and to offload joints by unweighting joint load by at least 50 % in navel deep water  and by at least 75-80% in chest to neck deep water .  Viscosity of the water  is needed for resistance of strengthening. Water  current perturbations provides challenge to standing balance requiring increased core activation.     PATIENT EDUCATION:  Education details: POC, HEP Person educated: Patient Education method: Explanation Education comprehension: verbalized understanding  HOME EXERCISE PROGRAM: Access Code: [email or web address] URL: [email or web address] Date: 09/16/2023 Prepared by: Almetta Fam  Exercises - Sit to Stand  - 1 x daily - 7 x weekly - 2 sets - 10 reps - Seated Long Arc Quad  - 1 x daily - 7 x weekly - 2 sets - 10 reps - 3 hold - Heel Raises with Counter Support  - 1  x daily - 7 x weekly - 2 sets - 10 reps - Supine Lower Trunk Rotation  - 1 x daily - 7 x weekly - 2 sets - 10 reps - Supine Bridge  - 1 x daily - 7 x weekly - 2 sets - 10 reps   Aquatic This aquatic home exercise program from Med[employer] utilizes pictures from land based exercises, but has been adapted prior to lamination and issuance.   Access Code: [email or web address] URL: [email or web address] Date: 11/08/2023 Prepared by: Frankie Ziemba  Exercises - Walking  - 1 x daily - 1-3 x weekly - 1-2 sets - 10 reps - Standing 'L' Stretch at [employer]  - 1 x daily - 1-3 x weekly - 1-2 sets - 10 reps - forward march with knee kick  - 1 x daily - 1-3 x weekly - Heel Toe Raises at Pool Wall  - 1 x daily - 1-3 x weekly - 1-2 sets - 10 reps - Kick Leg Out To The Side  - 1 x daily - 1-3 x weekly - 1-2 sets - 10 reps - Standing March at [facility]  - 1 x daily - 1-3 x weekly - 1-2 sets - 10 reps - Squat  - 1 x daily - 1-3 x weekly - 1-2 sets - 10 reps - Holding to pool wall bend knee towards buttocks  - 1 x daily - 1-3 x weekly - 1-2 sets - 10 reps - Leg swings flex/ext  - 1 x daily - 1-3 x weekly - 1-2 sets - 10 reps ASSESSMENT:  CLINICAL IMPRESSION:    Pt enters with LE back pain. Increase fatigue today with all functional activities. Rest given as needed throughout circuits. During gait trials, pt endorsed increased knee pain after 127ft. Would continue to benefit from skilled PT to address the deficits found in order to improve tolerance to activities and ADLs.  Eval:Patient is a 68 y.o. female who was seen today for physical therapy evaluation and treatment for chronic knee pain. She has pain on both sides but it is mostly the L knee that bothers her. Patient reports walking increases her pain and she is unable to do steps going up or down with the LLE. She compensates with her RLE when walking and due to this it has started causing pain in her R lower back. She has had shots in the L knee  and it  seemed to help for a little bit but she still has pain mostly with any standing and walking activities. Patient will benefit from PT to address her L knee pain as well as the back pain to increase her activity tolerance and improve strength to allow ease with ADLs.  OBJECTIVE IMPAIRMENTS: Abnormal gait, decreased activity tolerance, decreased balance, decreased endurance, difficulty walking, decreased strength, postural dysfunction, and pain.   ACTIVITY LIMITATIONS: squatting, stairs, and locomotion level  PARTICIPATION LIMITATIONS: meal prep, cleaning, laundry, driving, shopping, community activity, and yard work  PERSONAL FACTORS: Age, Fitness, Past/current experiences, and Time since onset of injury/illness/exacerbation are also affecting patient's functional outcome.   REHAB POTENTIAL: Good  CLINICAL DECISION MAKING: Stable/uncomplicated  EVALUATION COMPLEXITY: Low  GOALS: Goals reviewed with patient? Yes  SHORT TERM GOALS: Target date: 10/28/23  Patient will be independent with initial HEP. Baseline:  Goal status: MET 10/14/23  2.  Patient will decrease TUG <14s Baseline: 22s Goal status: IN PROGRESS 11/11/23, Met 13 sec    LONG TERM GOALS: Target date: 12/09/23  Patient will be independent with advanced/ongoing HEP to improve outcomes and carryover.  Baseline:  Goal status: IN PROGRESS 10/14/23;11/08/23 (Met in aquatics)  2.  Patient will report at least 75% improvement in  knee pain to improve QOL. (Pain <4/10) Baseline: 8/10 Goal status: IN PROGRESS 11/11/23 50% per pt, Progressing 5/10 12/01/23  3.  Patient will demonstrate improved functional LE strength as demonstrated by 5xSTS <15s. Baseline: 24s Goal status: IN PROGRESS 11/11/23, Met 11/29/23 20.42 sec   4. Patient will be able to ascend/descend stairs with 1 HR and reciprocal step pattern safely to access home and community.  Baseline:  Goal status: IN PROGRESS 11/11/23, ongoing 12/01/23  5.  Patient will tolerate  at least 30 min of standing and walking. Baseline: 5-10 mins Goal status: IN PROGRESS 11/11/23 maybe I could do 20, Partly Met I think I have done 30 min 12/01/23  PLAN:  PT FREQUENCY: 2x/week  PT DURATION: 12 weeks  PLANNED INTERVENTIONS: 97110-Therapeutic exercises, 97530- Therapeutic activity, 97112- Neuromuscular re-education, 97535- Self Care, 02859- Manual therapy, [phone]- Gait training, [id number]- Vasopneumatic device, [id number]- Ultrasound, [id number]- Ionotophoresis 4mg /ml Dexamethasone , 79439 (1-2 muscles), [id number] (3+ muscles)- Dry Needling, Patient/Family education, Balance training, Stair training, Taping, Joint mobilization, Joint manipulation, Spinal manipulation, Spinal mobilization, Cryotherapy, and Moist heat  PLAN FOR NEXT SESSION:   Progressive PREs, lumbar and hip mobility/flexibility, core, balance   Deneice Brownie, PT, DPT 12/09/23 11:41 AM

## 2023-12-10 ENCOUNTER — Ambulatory Visit

## 2023-12-17 ENCOUNTER — Encounter: Payer: Self-pay | Admitting: Physical Therapy

## 2023-12-17 ENCOUNTER — Ambulatory Visit: Attending: Family Medicine | Admitting: Physical Therapy

## 2023-12-17 ENCOUNTER — Ambulatory Visit (HOSPITAL_BASED_OUTPATIENT_CLINIC_OR_DEPARTMENT_OTHER): Admitting: Family

## 2023-12-17 DIAGNOSIS — M25562 Pain in left knee: Secondary | ICD-10-CM | POA: Diagnosis present

## 2023-12-17 DIAGNOSIS — M5459 Other low back pain: Secondary | ICD-10-CM | POA: Insufficient documentation

## 2023-12-17 DIAGNOSIS — G8929 Other chronic pain: Secondary | ICD-10-CM | POA: Diagnosis present

## 2023-12-17 DIAGNOSIS — M25561 Pain in right knee: Secondary | ICD-10-CM | POA: Insufficient documentation

## 2023-12-17 NOTE — Therapy (Signed)
 OUTPATIENT PHYSICAL THERAPY LOWER EXTREMITY TREATMENT & UPOC NOTE    Patient Name: [name] MRN: [id number] DOB:06-16-1955, 68 y.o., female Today's Date: 12/17/2023     END OF SESSION:  PT End of Session - 12/17/23 1102     Visit Number 23    Date for Recertification  02/03/24    Authorization Type [employer]    PT Start Time 1100    PT Stop Time 1145    PT Time Calculation (min) 45 min    Activity Tolerance Patient tolerated treatment well;Patient limited by fatigue;Patient limited by pain    Behavior During Therapy WFL for tasks assessed/performed           Past Medical History:  Diagnosis Date   Arthritis    Asthma    CHF (congestive heart failure) (HCC)    Coronary artery disease    Diabetes mellitus    Diabetic neuropathy (HCC)    Dysrhythmia    A.fib    takes Eliquis    GERD (gastroesophageal reflux disease)    History of hiatal hernia    History of kidney stones    Hx of cardiovascular stress test    Lexiscan  Myoview (10/15):  Medium area of ischemia in AL and IL distribution, cannot completely exclude shifting breast attenuation, EF 64%; Abnormal study   Hyperlipidemia    Hypertension    Mild carotid artery disease    Morbid obesity (HCC)    PAF (paroxysmal atrial fibrillation) (HCC)    Sleep apnea    does not wear CPAP   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   Syncope    Past Surgical History:  Procedure Laterality Date   ACHILLES TENDON REPAIR  1995   right   BIOPSY  07/22/2021   Procedure: BIOPSY;  Surgeon: Saintclair Jasper, MD;  Location: [facility];  Service: Gastroenterology;;   CATARACT EXTRACTION Bilateral    COLONOSCOPY     COLONOSCOPY WITH PROPOFOL  N/A 07/22/2021   Procedure: COLONOSCOPY WITH PROPOFOL ;  Surgeon: Saintclair Jasper, MD;  Location: [facility];  Service: Gastroenterology;  Laterality: N/A;   CYSTOSCOPY W/ RETROGRADES Right 06/15/2023   Procedure: CYSTOSCOPY, WITH RETROGRADE PYELOGRAM;  Surgeon: Shane Steffan BROCKS, MD;  Location: [facility];  Service: Urology;  Laterality: Right;   CYSTOSCOPY/URETEROSCOPY/HOLMIUM LASER/STENT PLACEMENT Right 06/15/2023   Procedure: CYSTOSCOPY/URETEROSCOPY/HOLMIUM LASER/STENT PLACEMENT;  Surgeon: Shane Steffan BROCKS, MD;  Location: [facility];  Service: Urology;  Laterality: Right;   DILATION AND CURETTAGE OF UTERUS     ESOPHAGOGASTRODUODENOSCOPY (EGD) WITH PROPOFOL  N/A 07/22/2021   Procedure: ESOPHAGOGASTRODUODENOSCOPY (EGD) WITH PROPOFOL ;  Surgeon: Saintclair Jasper, MD;  Location: [facility];  Service: Gastroenterology;  Laterality: N/A;   EYE SURGERY Left    detached retina repair   HAND SURGERY Right    right  pinky finger   ORIF   heart stent  2007   PARS PLANA VITRECTOMY Left 03/24/2017   Procedure: PARS PLANA VITRECTOMY WITH 25 GAUGE;  Surgeon: Elner Arley LABOR, MD;  Location: [facility];  Service: Ophthalmology;  Laterality: Left;   POLYPECTOMY  07/22/2021   Procedure: POLYPECTOMY;  Surgeon: Saintclair Jasper, MD;  Location: [facility];  Service: Gastroenterology;;   UTERINE FIBROID SURGERY  2008   Patient Active Problem List   Diagnosis Date Noted   Paroxysmal atrial fibrillation with RVR (HCC) 11/28/2020   Hypertensive urgency 11/28/2020   Syncope and collapse 11/28/2020   Nausea 11/28/2020   Hypokalemia 11/28/2020   Hyperglycemia due to diabetes mellitus (HCC) 11/28/2020   GERD (gastroesophageal reflux disease) 11/28/2020  Asthma 11/28/2020   Coronary atherosclerosis of native coronary artery 07/17/2013   Mixed hyperlipidemia 07/17/2013   Essential hypertension, benign 07/17/2013   Obesity 08/11/2011    PCP: Alberta Sharps  REFERRING PROVIDER: Marcey Her  REFERRING DIAG: Pain in L knee  THERAPY DIAG:  Bilateral chronic knee pain  Chronic pain of left knee  Other low back pain  Rationale for Evaluation and Treatment: Rehabilitation  ONSET DATE: chronic  SUBJECTIVE:   SUBJECTIVE STATEMENT:  Getting gel injection starting 11/19, 11/26 and 12/3, a little pain in the left  knee and thigh   EVAL: My knees hurt, the left one more. It is to the point where I can't step up or down on the L side. It has started making the back on the R side hurt now. They put a shot in it in June, it doesn't hurt like it did but I still don't have use of it.   PERTINENT HISTORY: Arthritis, CHF, DM, CAD, PAF, Stroke in 2022  PAIN:  Are you having pain? Yes: NPRS scale: 8/10 Pain location: L knee  Pain description: just hurts  Aggravating factors: walking, sitting or laying for too long and then trying to get up, depends on position, sometimes has no rhythm to it  Relieving factors: finding the right position but making sure I still move enough   PRECAUTIONS: None  RED FLAGS: None   WEIGHT BEARING RESTRICTIONS: No  FALLS:  Has patient fallen in last 6 months? No  LIVING ENVIRONMENT: Lives with: lives alone Lives in: House/apartment Stairs: Yes: Internal: steps into the basement steps; on left going up and External: 2 steps; on left going up   OCCUPATION: retired from social services   PLOF: Independent and Independent with basic ADLs  PATIENT GOALS:  to get rid of the pain and be able to walk and move again   NEXT MD VISIT:   OBJECTIVE:  Note: Objective measures were completed at Evaluation unless otherwise noted.  DIAGNOSTIC FINDINGS:   COGNITION: Overall cognitive status: Within functional limits for tasks assessed     SENSATION: WFL   MUSCLE LENGTH: Hamstrings: some tightness in BLE  POSTURE: rounded shoulders  LOWER EXTREMITY ROM: WFL Some pain with hip flexion on both sides in R side LBP Some pain with end range flexion on L knee  LOWER EXTREMITY MMT:  MMT Right eval Left eval 10/2 R  10/2 L  Right 12/01/23 Left 12/01/23  Hip flexion 4- 5 4+ 4+ 5 5  Hip extension        Hip abduction        Hip adduction        Hip internal rotation        Hip external rotation        Knee flexion 4+ 4- 5 4+ pain  5 4+  Knee extension 4+ 4+ 5 4-  pain  5 4- p!  Ankle dorsiflexion        Ankle plantarflexion        Ankle inversion        Ankle eversion         (Blank rows = not tested)   FUNCTIONAL TESTS:  5 times sit to stand: 24s; 10/14/23 26.8 seconds no UEs; 11/11/23- 19.8 seconds no UEs   Timed up and go (TUG): 22s; 10/14/23 17 seconds; 11/11/23 16.8 seconds no device    GAIT: Distance walked: in clinic distances Assistive device utilized: None Level of assistance: Complete Independence and Modified independence Comments:  antalgic gait, weight shifting on the RLE, taking quick steps with LLE to avoid using it and weight bearing                                                                                                                                 TREATMENT DATE:  12/17/23 Feet on ball K2C, rotation, small bridge posterior activation, isometric abs PAssive stretch to the LE's STM with the tgun to the left quad and thigh Nustep level 5 x 7 minutes 2.5# marches 2.5# LAQ Ball b/n knees squeeze 20# HS curls  12/09/23 Nustep Lvl 5 for 6 min Circuit 1: (2 rounds) - Seated Marches with 2.5# AW x 10 each - Seated LAQ with 2.5# AW x 8 each - Seated Ball Squeeze x 12 5xSTS - 24.56 seconds Circuit 2: (2 rounds) - Seated Rows with green tband x 8 - Seated Shoulder ER with yellow tband x 10 - Ambulate around the clinic 150 ft  12/06/23 HS curls 20lb 2x10 Leg Ext lb 2x10  NuStep L5 x 6 min 20lb resisted side steps x5 each Sit to stands x10 holding red ball   12/01/23 NuStep L 5 x 7 min  Goals  Stairs training 2 rails 4in alt pattern, L knee weakness and pain Sit to stands 2x10 holding red ball  HS curls 20lb 2x10   11/29/23 NuStep L 5 x 6 min 5X S2S 20.42 sec TUG 13 sec 20lb Resisted gait 4 way x 3 each  3# LAQ 2 sets 10 Standing march w/ 3lb Cuff 2x10   11/22/23:  Nustep lvl 5 for 6 min Seated Clamshells with Yellow Theraband 2 x 12 Seated Ball Squeezes 2 x 12 Seated LAQ with 2 lb AW x 10 each  side Seated Marches with 2lbs x 10 each side Cable Pulley Shoulder Extension 5 lbs 2 x 10  Cable Pulley Biceps Curl 5 lbs 2 x 10 Seated Pelvic Tilts  11/18/23: Supine PPT x 10 with 5 second holds Supine Pball HS Curls 2 x 10 Seated Pball Abdominals 2 x 10 with 3 second holds Supine Pball Trunk Rotation x 10 each way Standing resisted Hip flx, abd, and ext in // bars with yellow tband 3 x 5 each way for SL balance Nustep Lvl 5 for 6 min  11/16/23 3# LAQ 2 sets 10 3# hip flex and abd seated 2 sets 10 Green tband HS curl 2 sets 10 STS 10 x with yellow wt ball Nustep L 5 6 min 20# resisted giat 5 x fwd and backward, 23 x each side   11/11/23  TUG, MMT, 5xSTS, stair assessment, goals, POC and education on all   Nustep L2x6 minutes BLEs only seat 8  Bridges x10 Seated SLRs x10 B 0# STS + yellow TB around knees x10   11/05/23 Pt seen for aquatic therapy today.  Treatment took place in water  3.5-4.75 ft in depth at the [facility] pool.  Temp of water  was 91.  Pt entered/exited the pool via lift (for safety Pt afraid of water ) with hand rail.  Exercises - Walking  - Standing 'L' Stretch at [employer]   - forward march with knee kick  - Heel Toe Raises at [facility]   - Kick Leg Out To The Side  - Standing March at [facility]   - Squat - Holding to pool wall bend knee towards buttocks   - Leg swings flex/ext     Pt requires the buoyancy and hydrostatic pressure of water  for support, and to offload joints by unweighting joint load by at least 50 % in navel deep water  and by at least 75-80% in chest to neck deep water .  Viscosity of the water  is needed for resistance of strengthening. Water  current perturbations provides challenge to standing balance requiring increased core activation.     PATIENT EDUCATION:  Education details: POC, HEP Person educated: Patient Education method: Explanation Education comprehension: verbalized understanding  HOME EXERCISE  PROGRAM: Access Code: [email or web address] URL: [email or web address] Date: 09/16/2023 Prepared by: Almetta Fam  Exercises - Sit to Stand  - 1 x daily - 7 x weekly - 2 sets - 10 reps - Seated Long Arc Quad  - 1 x daily - 7 x weekly - 2 sets - 10 reps - 3 hold - Heel Raises with Counter Support  - 1 x daily - 7 x weekly - 2 sets - 10 reps - Supine Lower Trunk Rotation  - 1 x daily - 7 x weekly - 2 sets - 10 reps - Supine Bridge  - 1 x daily - 7 x weekly - 2 sets - 10 reps   Aquatic This aquatic home exercise program from MedBridge utilizes pictures from land based exercises, but has been adapted prior to lamination and issuance.   Access Code: [email or web address] URL: [email or web address] Date: 11/08/2023 Prepared by: Frankie Ziemba  Exercises - Walking  - 1 x daily - 1-3 x weekly - 1-2 sets - 10 reps - Standing 'L' Stretch at [employer]  - 1 x daily - 1-3 x weekly - 1-2 sets - 10 reps - forward march with knee kick  - 1 x daily - 1-3 x weekly - Heel Toe Raises at Pool Wall  - 1 x daily - 1-3 x weekly - 1-2 sets - 10 reps - Kick Leg Out To The Side  - 1 x daily - 1-3 x weekly - 1-2 sets - 10 reps - Standing March at [facility]  - 1 x daily - 1-3 x weekly - 1-2 sets - 10 reps - Squat  - 1 x daily - 1-3 x weekly - 1-2 sets - 10 reps - Holding to pool wall bend knee towards buttocks  - 1 x daily - 1-3 x weekly - 1-2 sets - 10 reps - Leg swings flex/ext  - 1 x daily - 1-3 x weekly - 1-2 sets - 10 reps ASSESSMENT:  CLINICAL IMPRESSION:    Pt enters with some left thigh and hip pain.  We did some STM to this area and some stretches before we started the exercises, this seemed to help her as she was walking better after this.Would continue to benefit from skilled PT to address the deficits found in order to improve tolerance to activities and ADLs.  Eval:Patient is a 68 y.o. female who was seen today for physical therapy evaluation and treatment for chronic knee pain. She has pain  on both  sides but it is mostly the L knee that bothers her. Patient reports walking increases her pain and she is unable to do steps going up or down with the LLE. She compensates with her RLE when walking and due to this it has started causing pain in her R lower back. She has had shots in the L knee and it seemed to help for a little bit but she still has pain mostly with any standing and walking activities. Patient will benefit from PT to address her L knee pain as well as the back pain to increase her activity tolerance and improve strength to allow ease with ADLs.  OBJECTIVE IMPAIRMENTS: Abnormal gait, decreased activity tolerance, decreased balance, decreased endurance, difficulty walking, decreased strength, postural dysfunction, and pain.   ACTIVITY LIMITATIONS: squatting, stairs, and locomotion level  PARTICIPATION LIMITATIONS: meal prep, cleaning, laundry, driving, shopping, community activity, and yard work  PERSONAL FACTORS: Age, Fitness, Past/current experiences, and Time since onset of injury/illness/exacerbation are also affecting patient's functional outcome.   REHAB POTENTIAL: Good  CLINICAL DECISION MAKING: Stable/uncomplicated  EVALUATION COMPLEXITY: Low  GOALS: Goals reviewed with patient? Yes  SHORT TERM GOALS: Target date: 10/28/23  Patient will be independent with initial HEP. Baseline:  Goal status: MET 10/14/23  2.  Patient will decrease TUG <14s Baseline: 22s Goal status: IN PROGRESS 11/11/23, Met 13 sec    LONG TERM GOALS: Target date: 12/09/23  Patient will be independent with advanced/ongoing HEP to improve outcomes and carryover.  Baseline:  Goal status: IN PROGRESS 10/14/23;11/08/23 (Met in aquatics)  2.  Patient will report at least 75% improvement in  knee pain to improve QOL. (Pain <4/10) Baseline: 8/10 Goal status: IN PROGRESS 11/11/23 50% per pt, Progressing 5/10 12/01/23  3.  Patient will demonstrate improved functional LE strength as demonstrated  by 5xSTS <15s. Baseline: 24s Goal status: IN PROGRESS 11/11/23, Met 11/29/23 20.42 sec   4. Patient will be able to ascend/descend stairs with 1 HR and reciprocal step pattern safely to access home and community.  Baseline:  Goal status: IN PROGRESS 11/11/23, ongoing 12/01/23  5.  Patient will tolerate at least 30 min of standing and walking. Baseline: 5-10 mins Goal status: IN PROGRESS 11/11/23 maybe I could do 20, Partly Met I think I have done 30 min 12/01/23 progresng 12/17/23  PLAN:  PT FREQUENCY: 2x/week  PT DURATION: 12 weeks  PLANNED INTERVENTIONS: 97110-Therapeutic exercises, 97530- Therapeutic activity, 97112- Neuromuscular re-education, 97535- Self Care, [id number]- Manual therapy, [phone]- Gait training, [phone]- Vasopneumatic device, [id number]- Ultrasound, [id number]- Ionotophoresis 4mg /ml Dexamethasone , 79439 (1-2 muscles), [id number] (3+ muscles)- Dry Needling, Patient/Family education, Balance training, Stair training, Taping, Joint mobilization, Joint manipulation, Spinal manipulation, Spinal mobilization, Cryotherapy, and Moist heat  PLAN FOR NEXT SESSION:   Progressive PREs, lumbar and hip mobility/flexibility, core, balance  She is scheduled for gel injections 11/19, 11/26 and 12/3, I did ask her to ask the MD regarding PT appointments as she has a PT appointment on the 21st.  Ozell Mainland, PT 12/17/23 11:02 AM

## 2023-12-29 ENCOUNTER — Encounter: Payer: Self-pay | Admitting: Physical Therapy

## 2023-12-29 ENCOUNTER — Ambulatory Visit: Admitting: Physical Therapy

## 2023-12-29 DIAGNOSIS — G8929 Other chronic pain: Secondary | ICD-10-CM

## 2023-12-29 DIAGNOSIS — M25561 Pain in right knee: Secondary | ICD-10-CM | POA: Diagnosis not present

## 2023-12-29 DIAGNOSIS — M5459 Other low back pain: Secondary | ICD-10-CM

## 2023-12-29 NOTE — Therapy (Signed)
 OUTPATIENT PHYSICAL THERAPY LOWER EXTREMITY TREATMENT & UPOC NOTE    Patient Name: [name] MRN: [id number] DOB:Mar 01, 1955, 68 y.o., female Today's Date: 12/29/2023     END OF SESSION:  PT End of Session - 12/29/23 1300     Visit Number 24    Date for Recertification  02/03/24    PT Start Time 1300    PT Stop Time 1345    PT Time Calculation (min) 45 min    Activity Tolerance Patient tolerated treatment well;Patient limited by fatigue;Patient limited by pain    Behavior During Therapy WFL for tasks assessed/performed           Past Medical History:  Diagnosis Date   Arthritis    Asthma    CHF (congestive heart failure) (HCC)    Coronary artery disease    Diabetes mellitus    Diabetic neuropathy (HCC)    Dysrhythmia    A.fib    takes Eliquis    GERD (gastroesophageal reflux disease)    History of hiatal hernia    History of kidney stones    Hx of cardiovascular stress test    Lexiscan  Myoview (10/15):  Medium area of ischemia in AL and IL distribution, cannot completely exclude shifting breast attenuation, EF 64%; Abnormal study   Hyperlipidemia    Hypertension    Mild carotid artery disease    Morbid obesity (HCC)    PAF (paroxysmal atrial fibrillation) (HCC)    Sleep apnea    does not wear CPAP   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   Syncope    Past Surgical History:  Procedure Laterality Date   ACHILLES TENDON REPAIR  1995   right   BIOPSY  07/22/2021   Procedure: BIOPSY;  Surgeon: Saintclair Jasper, MD;  Location: [facility];  Service: Gastroenterology;;   CATARACT EXTRACTION Bilateral    COLONOSCOPY     COLONOSCOPY WITH PROPOFOL  N/A 07/22/2021   Procedure: COLONOSCOPY WITH PROPOFOL ;  Surgeon: Saintclair Jasper, MD;  Location: [facility];  Service: Gastroenterology;  Laterality: N/A;   CYSTOSCOPY W/ RETROGRADES Right 06/15/2023   Procedure: CYSTOSCOPY, WITH RETROGRADE PYELOGRAM;  Surgeon: Shane Steffan BROCKS, MD;  Location: [facility];  Service: Urology;   Laterality: Right;   CYSTOSCOPY/URETEROSCOPY/HOLMIUM LASER/STENT PLACEMENT Right 06/15/2023   Procedure: CYSTOSCOPY/URETEROSCOPY/HOLMIUM LASER/STENT PLACEMENT;  Surgeon: Shane Steffan BROCKS, MD;  Location: [facility];  Service: Urology;  Laterality: Right;   DILATION AND CURETTAGE OF UTERUS     ESOPHAGOGASTRODUODENOSCOPY (EGD) WITH PROPOFOL  N/A 07/22/2021   Procedure: ESOPHAGOGASTRODUODENOSCOPY (EGD) WITH PROPOFOL ;  Surgeon: Saintclair Jasper, MD;  Location: [facility];  Service: Gastroenterology;  Laterality: N/A;   EYE SURGERY Left    detached retina repair   HAND SURGERY Right    right  pinky finger   ORIF   heart stent  2007   PARS PLANA VITRECTOMY Left 03/24/2017   Procedure: PARS PLANA VITRECTOMY WITH 25 GAUGE;  Surgeon: Elner Arley LABOR, MD;  Location: [facility];  Service: Ophthalmology;  Laterality: Left;   POLYPECTOMY  07/22/2021   Procedure: POLYPECTOMY;  Surgeon: Saintclair Jasper, MD;  Location: [facility];  Service: Gastroenterology;;   UTERINE FIBROID SURGERY  2008   Patient Active Problem List   Diagnosis Date Noted   Paroxysmal atrial fibrillation with RVR (HCC) 11/28/2020   Hypertensive urgency 11/28/2020   Syncope and collapse 11/28/2020   Nausea 11/28/2020   Hypokalemia 11/28/2020   Hyperglycemia due to diabetes mellitus (HCC) 11/28/2020   GERD (gastroesophageal reflux disease) 11/28/2020   Asthma 11/28/2020  Coronary atherosclerosis of native coronary artery 07/17/2013   Mixed hyperlipidemia 07/17/2013   Essential hypertension, benign 07/17/2013   Obesity 08/11/2011    PCP: Alberta Sharps  REFERRING PROVIDER: Marcey Her  REFERRING DIAG: Pain in L knee  THERAPY DIAG:  Chronic pain of left knee  Bilateral chronic knee pain  Other low back pain  Rationale for Evaluation and Treatment: Rehabilitation  ONSET DATE: chronic  SUBJECTIVE:   SUBJECTIVE STATEMENT:  Getting gel injection starting today. Feel pretty decent today  EVAL: My knees hurt, the left one more. It  is to the point where I can't step up or down on the L side. It has started making the back on the R side hurt now. They put a shot in it in June, it doesn't hurt like it did but I still don't have use of it.   PERTINENT HISTORY: Arthritis, CHF, DM, CAD, PAF, Stroke in 2022  PAIN:  Are you having pain? Yes: NPRS scale: 4/10 Pain location: L knee  Pain description: just hurts  Aggravating factors: walking, sitting or laying for too long and then trying to get up, depends on position, sometimes has no rhythm to it  Relieving factors: finding the right position but making sure I still move enough   PRECAUTIONS: None  RED FLAGS: None   WEIGHT BEARING RESTRICTIONS: No  FALLS:  Has patient fallen in last 6 months? No  LIVING ENVIRONMENT: Lives with: lives alone Lives in: House/apartment Stairs: Yes: Internal: steps into the basement steps; on left going up and External: 2 steps; on left going up   OCCUPATION: retired from social services   PLOF: Independent and Independent with basic ADLs  PATIENT GOALS:  to get rid of the pain and be able to walk and move again   NEXT MD VISIT:   OBJECTIVE:  Note: Objective measures were completed at Evaluation unless otherwise noted.  DIAGNOSTIC FINDINGS:   COGNITION: Overall cognitive status: Within functional limits for tasks assessed     SENSATION: WFL   MUSCLE LENGTH: Hamstrings: some tightness in BLE  POSTURE: rounded shoulders  LOWER EXTREMITY ROM: WFL Some pain with hip flexion on both sides in R side LBP Some pain with end range flexion on L knee  LOWER EXTREMITY MMT:  MMT Right eval Left eval 10/2 R  10/2 L  Right 12/01/23 Left 12/01/23  Hip flexion 4- 5 4+ 4+ 5 5  Hip extension        Hip abduction        Hip adduction        Hip internal rotation        Hip external rotation        Knee flexion 4+ 4- 5 4+ pain  5 4+  Knee extension 4+ 4+ 5 4- pain  5 4- p!  Ankle dorsiflexion        Ankle plantarflexion         Ankle inversion        Ankle eversion         (Blank rows = not tested)   FUNCTIONAL TESTS:  5 times sit to stand: 24s; 10/14/23 26.8 seconds no UEs; 11/11/23- 19.8 seconds no UEs   Timed up and go (TUG): 22s; 10/14/23 17 seconds; 11/11/23 16.8 seconds no device    GAIT: Distance walked: in clinic distances Assistive device utilized: None Level of assistance: Complete Independence and Modified independence Comments: antalgic gait, weight shifting on the RLE, taking quick steps with LLE to  avoid using it and weight bearing                                                                                                                                 TREATMENT DATE:  12/29/23 NuStep L 5 x 7 min Gait around the island on L side to the front of the therapy building  Pt required frequent standing rest breaks due to fatigue, R hip and low back pain  Increase time required  Sit to stand 2x10 LAQ 2lb 2x10   12/17/23 Feet on ball K2C, rotation, small bridge posterior activation, isometric abs PAssive stretch to the LE's STM with the tgun to the left quad and thigh Nustep level 5 x 7 minutes 2.5# marches 2.5# LAQ Ball b/n knees squeeze 20# HS curls  12/09/23 Nustep Lvl 5 for 6 min Circuit 1: (2 rounds) - Seated Marches with 2.5# AW x 10 each - Seated LAQ with 2.5# AW x 8 each - Seated Ball Squeeze x 12 5xSTS - 24.56 seconds Circuit 2: (2 rounds) - Seated Rows with green tband x 8 - Seated Shoulder ER with yellow tband x 10 - Ambulate around the clinic 150 ft  12/06/23 HS curls 20lb 2x10 Leg Ext lb 2x10  NuStep L5 x 6 min 20lb resisted side steps x5 each Sit to stands x10 holding red ball   12/01/23 NuStep L 5 x 7 min  Goals  Stairs training 2 rails 4in alt pattern, L knee weakness and pain Sit to stands 2x10 holding red ball  HS curls 20lb 2x10   11/29/23 NuStep L 5 x 6 min 5X S2S 20.42 sec TUG 13 sec 20lb Resisted gait 4 way x 3 each  3# LAQ 2 sets  10 Standing march w/ 3lb Cuff 2x10   11/22/23:  Nustep lvl 5 for 6 min Seated Clamshells with Yellow Theraband 2 x 12 Seated Ball Squeezes 2 x 12 Seated LAQ with 2 lb AW x 10 each side Seated Marches with 2lbs x 10 each side Cable Pulley Shoulder Extension 5 lbs 2 x 10  Cable Pulley Biceps Curl 5 lbs 2 x 10 Seated Pelvic Tilts  11/18/23: Supine PPT x 10 with 5 second holds Supine Pball HS Curls 2 x 10 Seated Pball Abdominals 2 x 10 with 3 second holds Supine Pball Trunk Rotation x 10 each way Standing resisted Hip flx, abd, and ext in // bars with yellow tband 3 x 5 each way for SL balance Nustep Lvl 5 for 6 min  11/16/23 3# LAQ 2 sets 10 3# hip flex and abd seated 2 sets 10 Green tband HS curl 2 sets 10 STS 10 x with yellow wt ball Nustep L 5 6 min 20# resisted giat 5 x fwd and backward, 23 x each side   11/11/23  TUG, MMT, 5xSTS, stair assessment, goals, POC and education on all   Nustep L2x6 minutes BLEs only seat 8  Bridges x10 Seated SLRs x10 B 0# STS + yellow TB around knees x10   11/05/23 Pt seen for aquatic therapy today.  Treatment took place in water  3.5-4.75 ft in depth at the [facility] pool. Temp of water  was 91.  Pt entered/exited the pool via lift (for safety Pt afraid of water ) with hand rail.  Exercises - Walking  - Standing 'L' Stretch at [employer]   - forward march with knee kick  - Heel Toe Raises at [facility]   - Kick Leg Out To The Side  - Standing March at [facility]   - Squat - Holding to pool wall bend knee towards buttocks   - Leg swings flex/ext     Pt requires the buoyancy and hydrostatic pressure of water  for support, and to offload joints by unweighting joint load by at least 50 % in navel deep water  and by at least 75-80% in chest to neck deep water .  Viscosity of the water  is needed for resistance of strengthening. Water  current perturbations provides challenge to standing balance requiring increased core activation.      PATIENT EDUCATION:  Education details: POC, HEP Person educated: Patient Education method: Explanation Education comprehension: verbalized understanding  HOME EXERCISE PROGRAM: Access Code: [email or web address] URL: [email or web address] Date: 09/16/2023 Prepared by: Almetta Fam  Exercises - Sit to Stand  - 1 x daily - 7 x weekly - 2 sets - 10 reps - Seated Long Arc Quad  - 1 x daily - 7 x weekly - 2 sets - 10 reps - 3 hold - Heel Raises with Counter Support  - 1 x daily - 7 x weekly - 2 sets - 10 reps - Supine Lower Trunk Rotation  - 1 x daily - 7 x weekly - 2 sets - 10 reps - Supine Bridge  - 1 x daily - 7 x weekly - 2 sets - 10 reps   Aquatic This aquatic home exercise program from Med[employer] utilizes pictures from land based exercises, but has been adapted prior to lamination and issuance.   Access Code: [email or web address] URL: [email or web address] Date: 11/08/2023 Prepared by: Frankie Ziemba  Exercises - Walking  - 1 x daily - 1-3 x weekly - 1-2 sets - 10 reps - Standing 'L' Stretch at [employer]  - 1 x daily - 1-3 x weekly - 1-2 sets - 10 reps - forward march with knee kick  - 1 x daily - 1-3 x weekly - Heel Toe Raises at Pool Wall  - 1 x daily - 1-3 x weekly - 1-2 sets - 10 reps - Kick Leg Out To The Side  - 1 x daily - 1-3 x weekly - 1-2 sets - 10 reps - Standing March at [facility]  - 1 x daily - 1-3 x weekly - 1-2 sets - 10 reps - Squat  - 1 x daily - 1-3 x weekly - 1-2 sets - 10 reps - Holding to pool wall bend knee towards buttocks  - 1 x daily - 1-3 x weekly - 1-2 sets - 10 reps - Leg swings flex/ext  - 1 x daily - 1-3 x weekly - 1-2 sets - 10 reps ASSESSMENT:  CLINICAL IMPRESSION:    Pt enters with some L knee pain. Session focused on increasing functional endurance. Increase time and multiple rest breaks needed with outdoor ambulation. Pt was surprised that her knee didn't bother her as much with gait. Would continue to benefit from skilled PT to  address  the deficits found in order to improve tolerance to activities and ADLs.  Eval:Patient is a 68 y.o. female who was seen today for physical therapy evaluation and treatment for chronic knee pain. She has pain on both sides but it is mostly the L knee that bothers her. Patient reports walking increases her pain and she is unable to do steps going up or down with the LLE. She compensates with her RLE when walking and due to this it has started causing pain in her R lower back. She has had shots in the L knee and it seemed to help for a little bit but she still has pain mostly with any standing and walking activities. Patient will benefit from PT to address her L knee pain as well as the back pain to increase her activity tolerance and improve strength to allow ease with ADLs.  OBJECTIVE IMPAIRMENTS: Abnormal gait, decreased activity tolerance, decreased balance, decreased endurance, difficulty walking, decreased strength, postural dysfunction, and pain.   ACTIVITY LIMITATIONS: squatting, stairs, and locomotion level  PARTICIPATION LIMITATIONS: meal prep, cleaning, laundry, driving, shopping, community activity, and yard work  PERSONAL FACTORS: Age, Fitness, Past/current experiences, and Time since onset of injury/illness/exacerbation are also affecting patient's functional outcome.   REHAB POTENTIAL: Good  CLINICAL DECISION MAKING: Stable/uncomplicated  EVALUATION COMPLEXITY: Low  GOALS: Goals reviewed with patient? Yes  SHORT TERM GOALS: Target date: 10/28/23  Patient will be independent with initial HEP. Baseline:  Goal status: MET 10/14/23  2.  Patient will decrease TUG <14s Baseline: 22s Goal status: IN PROGRESS 11/11/23, Met 13 sec    LONG TERM GOALS: Target date: 12/09/23  Patient will be independent with advanced/ongoing HEP to improve outcomes and carryover.  Baseline:  Goal status: IN PROGRESS 10/14/23;11/08/23 (Met in aquatics)  2.  Patient will report at least 75%  improvement in  knee pain to improve QOL. (Pain <4/10) Baseline: 8/10 Goal status: IN PROGRESS 11/11/23 50% per pt, Progressing 5/10 12/01/23, progressing 5/10 12/29/23  3.  Patient will demonstrate improved functional LE strength as demonstrated by 5xSTS <15s. Baseline: 24s Goal status: IN PROGRESS 11/11/23, Met 11/29/23 20.42 sec   4. Patient will be able to ascend/descend stairs with 1 HR and reciprocal step pattern safely to access home and community.  Baseline:  Goal status: IN PROGRESS 11/11/23, ongoing 12/01/23  5.  Patient will tolerate at least 30 min of standing and walking. Baseline: 5-10 mins Goal status: IN PROGRESS 11/11/23 maybe I could do 20, Partly Met I think I have done 30 min 12/01/23 progresng 12/17/23  PLAN:  PT FREQUENCY: 2x/week  PT DURATION: 12 weeks  PLANNED INTERVENTIONS: 97110-Therapeutic exercises, 97530- Therapeutic activity, 97112- Neuromuscular re-education, 97535- Self Care, 02859- Manual therapy, [phone]- Gait training, [phone]- Vasopneumatic device, [id number]- Ultrasound, [id number]- Ionotophoresis 4mg /ml Dexamethasone , 79439 (1-2 muscles), [id number] (3+ muscles)- Dry Needling, Patient/Family education, Balance training, Stair training, Taping, Joint mobilization, Joint manipulation, Spinal manipulation, Spinal mobilization, Cryotherapy, and Moist heat  PLAN FOR NEXT SESSION:   Progressive PREs, lumbar and hip mobility/flexibility, core, balance  She is scheduled for gel injections 11/19, 11/26 and 12/3, I did ask her to ask the MD regarding PT appointments as she has a PT appointment on the 21st.  Ozell Mainland, PT 12/29/23 1:02 PM

## 2023-12-31 ENCOUNTER — Telehealth: Payer: Self-pay | Admitting: Internal Medicine

## 2023-12-31 ENCOUNTER — Ambulatory Visit: Admitting: Radiology

## 2023-12-31 ENCOUNTER — Ambulatory Visit

## 2023-12-31 ENCOUNTER — Encounter: Payer: Self-pay | Admitting: Emergency Medicine

## 2023-12-31 ENCOUNTER — Ambulatory Visit
Admission: EM | Admit: 2023-12-31 | Discharge: 2023-12-31 | Disposition: A | Discharge status: Home / Self Care | Attending: Internal Medicine | Admitting: Internal Medicine

## 2023-12-31 DIAGNOSIS — M79671 Pain in right foot: Secondary | ICD-10-CM

## 2023-12-31 DIAGNOSIS — M7731 Calcaneal spur, right foot: Secondary | ICD-10-CM

## 2023-12-31 MED ORDER — PREDNISONE 20 MG PO TABS: 40.0000 mg | ORAL_TABLET | Freq: Every day | ORAL | 0 refills | Status: AC

## 2023-12-31 NOTE — ED Provider Notes (Signed)
 [facility]    CSN: [id number] Arrival date & time: 12/31/23  1035      History   Chief Complaint Chief Complaint  Patient presents with   Foot Pain    HPI [name] is a 68 y.o. female.   [name] is a 68 y.o. female presenting for chief complaint of right foot/heel pain and swelling that started on Monday, December 27, 2023 without known trauma/injury.  Pain is worsened by ambulation/weightbearing activity and is mostly localized to the dorsum of the right heel.  History of gouty arthritis to the right foot, states she recently ate shrimp and steak which are typical gout triggers for her.  Her pain and swelling started 24 to 48 hours after eating the seafood/red meat.  Denies paresthesias to the distal right foot, fever, chills, and recent falls.  She has not attempted use of any over-the-counter medications to help with symptoms prior to arrival.  Denies recent steroid use.  Takes Eliquis  daily for prevention due to atrial fibrillation.   Foot Pain    Past Medical History:  Diagnosis Date   Arthritis    Asthma    CHF (congestive heart failure) (HCC)    Coronary artery disease    Diabetes mellitus    Diabetic neuropathy (HCC)    Dysrhythmia    A.fib    takes Eliquis    GERD (gastroesophageal reflux disease)    History of hiatal hernia    History of kidney stones    Hx of cardiovascular stress test    Lexiscan  Myoview (10/15):  Medium area of ischemia in AL and IL distribution, cannot completely exclude shifting breast attenuation, EF 64%; Abnormal study   Hyperlipidemia    Hypertension    Mild carotid artery disease    Morbid obesity (HCC)    PAF (paroxysmal atrial fibrillation) (HCC)    Sleep apnea    does not wear CPAP   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   Syncope     Patient Active Problem List   Diagnosis Date Noted   Paroxysmal atrial fibrillation with RVR (HCC) 11/28/2020   Hypertensive urgency 11/28/2020   Syncope and  collapse 11/28/2020   Nausea 11/28/2020   Hypokalemia 11/28/2020   Hyperglycemia due to diabetes mellitus (HCC) 11/28/2020   GERD (gastroesophageal reflux disease) 11/28/2020   Asthma 11/28/2020   Coronary atherosclerosis of native coronary artery 07/17/2013   Mixed hyperlipidemia 07/17/2013   Essential hypertension, benign 07/17/2013   Obesity 08/11/2011    Past Surgical History:  Procedure Laterality Date   ACHILLES TENDON REPAIR  1995   right   BIOPSY  07/22/2021   Procedure: BIOPSY;  Surgeon: Saintclair Jasper, MD;  Location: [facility];  Service: Gastroenterology;;   CATARACT EXTRACTION Bilateral    COLONOSCOPY     COLONOSCOPY WITH PROPOFOL  N/A 07/22/2021   Procedure: COLONOSCOPY WITH PROPOFOL ;  Surgeon: Saintclair Jasper, MD;  Location: [facility];  Service: Gastroenterology;  Laterality: N/A;   CYSTOSCOPY W/ RETROGRADES Right 06/15/2023   Procedure: CYSTOSCOPY, WITH RETROGRADE PYELOGRAM;  Surgeon: Shane Steffan BROCKS, MD;  Location: [facility];  Service: Urology;  Laterality: Right;   CYSTOSCOPY/URETEROSCOPY/HOLMIUM LASER/STENT PLACEMENT Right 06/15/2023   Procedure: CYSTOSCOPY/URETEROSCOPY/HOLMIUM LASER/STENT PLACEMENT;  Surgeon: Shane Steffan BROCKS, MD;  Location: [facility];  Service: Urology;  Laterality: Right;   DILATION AND CURETTAGE OF UTERUS     ESOPHAGOGASTRODUODENOSCOPY (EGD) WITH PROPOFOL  N/A 07/22/2021   Procedure: ESOPHAGOGASTRODUODENOSCOPY (EGD) WITH PROPOFOL ;  Surgeon: Saintclair Jasper, MD;  Location: [facility];  Service: Gastroenterology;  Laterality: N/A;   EYE SURGERY Left    detached retina repair   HAND SURGERY Right    right  pinky finger   ORIF   heart stent  2007   PARS PLANA VITRECTOMY Left 03/24/2017   Procedure: PARS PLANA VITRECTOMY WITH 25 GAUGE;  Surgeon: Elner Arley LABOR, MD;  Location: [facility];  Service: Ophthalmology;  Laterality: Left;   POLYPECTOMY  07/22/2021   Procedure: POLYPECTOMY;  Surgeon: Saintclair Jasper, MD;  Location: [facility];  Service:  Gastroenterology;;   UTERINE FIBROID SURGERY  2008    OB History   No obstetric history on file.      Home Medications    Prior to Admission medications   Medication Sig Start Date End Date Taking? Authorizing Provider  acetaminophen  (TYLENOL ) 500 MG tablet Take 1,000 mg by mouth every 6 (six) hours as needed (pain.).    [provider]  albuterol  (VENTOLIN  HFA) 108 (90 Base) MCG/ACT inhaler Inhale 1-2 puffs into the lungs every 6 (six) hours as needed for wheezing or shortness of breath. 04/16/21   Christopher Savannah, PA-C  amLODipine  (NORVASC ) 10 MG tablet TAKE 1 TABLET BY MOUTH ONCE DAILY. PLEASE KEEP UPCOMING APPOINTMENT IN FEBRUARY 2024 FOR FUTURE REFILLS. THANK YOU 07/09/23   Dann Candyce RAMAN, MD  apixaban  (ELIQUIS ) 5 MG TABS tablet Take 1 tablet by mouth twice daily 07/13/23   Lonni Slain, MD  aspirin  EC 81 MG tablet Take 1 tablet (81 mg total) by mouth daily. 03/03/19   Dann Candyce RAMAN, MD  BREO ELLIPTA  100-25 MCG/INH AEPB Inhale 1 puff into the lungs in the morning. 09/21/16   [provider]  carvedilol  (COREG ) 6.25 MG tablet Take 1 tablet by mouth twice daily 09/29/22   Varanasi, Jayadeep S, MD  Cholecalciferol (VITAMIN D3) 25 MCG (1000 UT) CAPS 1 capsule.    [provider]  colestipol (COLESTID) 1 g tablet Take 2 g by mouth daily in the afternoon. 05/24/23   [provider]  Cyanocobalamin (VITAMIN B12) 1000 MCG TABS 1 tablet Orally Once a day    [provider]  escitalopram (LEXAPRO) 5 MG tablet Take 5 mg by mouth daily at 8 pm. 11/22/20   [provider]  ezetimibe  (ZETIA ) 10 MG tablet Take 1 tablet by mouth once daily 09/29/22   Dann Candyce RAMAN, MD  fluticasone  (FLONASE ) 50 MCG/ACT nasal spray Place 2 sprays into both nostrils daily. 12/04/23   Rising, Asberry, PA-C  FREESTYLE LITE test strip 1 each by Other route as directed.  07/08/11   [provider]  furosemide  (LASIX ) 40 MG tablet Take 2 tablets  (80 mg total) by mouth daily. 06/01/22   Dann Candyce RAMAN, MD  guaiFENesin  (MUCINEX ) 600 MG 12 hr tablet Take 2 tablets (1,200 mg total) by mouth 2 (two) times daily. 12/04/23   Rising, Asberry, PA-C  HUMULIN R  U-500 KWIKPEN 500 UNIT/ML KwikPen Inject 30-80 Units into the skin See admin instructions. INJECT 80 UNITS SUBCUTANEOUSLY BEFORE BREAKFAST AND 30 UNITS BEFORE EVENING MEAL TWICE DAILY 30 MINUTES BEFORE MEALS 04/30/23   [provider]  hyoscyamine  (ANASPAZ ) 0.125 MG TBDP disintergrating tablet Place 1 tablet (0.125 mg total) under the tongue every 6 (six) hours as needed for up to 20 doses. 06/15/23   Shane Steffan BROCKS, MD  lisinopril  (ZESTRIL ) 40 MG tablet Take 1 tablet by mouth once daily 06/10/23   Lonni Slain, MD  methocarbamol  (ROBAXIN ) 750 MG tablet Take 750 mg by mouth 4 (four) times daily  as needed (pain/spasms). 05/31/23   [provider]  Multiple Vitamin (MULTI VITAMIN) TABS 1 tablet Orally Once a day    [provider]  nitroGLYCERIN  (NITROSTAT ) 0.4 MG SL tablet Place 1 tablet (0.4 mg total) under the tongue every 5 (five) minutes as needed for chest pain (X 3 DOSES FOR CHEST PAIN). 09/29/22   Dann Candyce RAMAN, MD  OZEMPIC, 2 MG/DOSE, 8 MG/3ML SOPN Inject 2 mg into the skin every Sunday. 05/25/23   [provider]  potassium chloride  (KLOR-CON ) 10 MEQ tablet Take 1 tablet (10 mEq total) by mouth in the morning and at bedtime. 10/12/23   Lonni Slain, MD  Povidone, PF, (IVIZIA DRY EYES) 0.5 % SOLN Place 1 drop into both eyes daily.    [provider]  rosuvastatin  (CRESTOR ) 40 MG tablet Take 1 tablet by mouth once daily 07/09/23   Lonni Slain, MD  tamsulosin  (FLOMAX ) 0.4 MG CAPS capsule Take 1 capsule (0.4 mg total) by mouth daily after supper. 06/15/23   Shane Steffan BROCKS, MD    Family History Family History  Problem Relation Age of Onset   Heart disease Mother    Kidney disease Father    Stroke Father     Heart attack Father    Heart disease Brother    Heart disease Brother    Stroke Maternal Grandfather    Stroke Paternal Grandfather    Asthma Other    Hypertension Other    Diabetes Other     Social History Social History   Tobacco Use   Smoking status: Former    Current packs/day: 0.00    Types: Cigarettes    Quit date: 08/07/1982    Years since quitting: 41.4   Smokeless tobacco: Never  Vaping Use   Vaping status: Never Used  Substance Use Topics   Alcohol use: No   Drug use: No     Allergies   Bystolic [nebivolol hcl], Crestor  [rosuvastatin ], Other, Sulfa antibiotics, Bystolic [nebivolol hcl], Invokana [canagliflozin], Januvia [sitagliptin], Jardiance [empagliflozin], Keflex  [cephalexin ], Metformin and related, and Mobic [meloxicam]   Review of Systems Review of Systems Per HPI  Physical Exam Triage Vital Signs ED Triage Vitals  Encounter Vitals Group     BP 12/31/23 1043 (!) 145/68     Girls Systolic BP Percentile --      Girls Diastolic BP Percentile --      Boys Systolic BP Percentile --      Boys Diastolic BP Percentile --      Pulse Rate 12/31/23 1043 76     Resp 12/31/23 1043 16     Temp 12/31/23 1043 98.3 F (36.8 C)     Temp Source 12/31/23 1043 Oral     SpO2 12/31/23 1043 93 %     Weight 12/31/23 1043 270 lb (122.5 kg)     Height 12/31/23 1043 5' 2.5 (1.588 m)     Head Circumference --      Peak Flow --      Pain Score 12/31/23 1053 10     Pain Loc --      Pain Education --      Exclude from Growth Chart --    No data found.  Updated Vital Signs BP (!) 145/68 (BP Location: Right Arm)   Pulse 76   Temp 98.3 F (36.8 C) (Oral)   Resp 16   Ht 5' 2.5 (1.588 m)   Wt 270 lb (122.5 kg)   LMP  (LMP Unknown)  SpO2 93%   BMI 48.60 kg/m   Visual Acuity Right Eye Distance:   Left Eye Distance:   Bilateral Distance:    Right Eye Near:   Left Eye Near:    Bilateral Near:     Physical Exam Vitals and nursing note reviewed.   Constitutional:      Appearance: She is not ill-appearing or toxic-appearing.  HENT:     Head: Normocephalic and atraumatic.     Right Ear: Hearing and external ear normal.     Left Ear: Hearing and external ear normal.     Nose: Nose normal.     Mouth/Throat:     Lips: Pink.  Eyes:     General: Lids are normal. Vision grossly intact. Gaze aligned appropriately.     Extraocular Movements: Extraocular movements intact.     Conjunctiva/sclera: Conjunctivae normal.  Pulmonary:     Effort: Pulmonary effort is normal.  Musculoskeletal:     Cervical back: Neck supple.     Right foot: Normal range of motion and normal capillary refill. Swelling (Scant soft tissue swelling about the right dorsal heel) and tenderness present. No deformity, bunion, Charcot foot, foot drop, prominent metatarsal heads, laceration, bony tenderness or crepitus. Normal pulse.     Left foot: Normal.     Comments: +2 right dorsalis pedis pulse, less than 2 cap refill, dorsiflexion and plantarflexion 5/5 against resistance to the right foot.  Ambulatory with steady gait with cane at baseline.  Sensation intact to distal right lower extremity. Very tender to palpation over the dorsal right heel, minimal erythema present.  No open wounds.  Skin:    General: Skin is warm and dry.     Capillary Refill: Capillary refill takes less than 2 seconds.     Findings: No rash.  Neurological:     General: No focal deficit present.     Mental Status: She is alert and oriented to person, place, and time. Mental status is at baseline.     Cranial Nerves: No dysarthria or facial asymmetry.  Psychiatric:        Mood and Affect: Mood normal.        Speech: Speech normal.        Behavior: Behavior normal.        Thought Content: Thought content normal.        Judgment: Judgment normal.      UC Treatments / Results  Labs (all labs ordered are listed, but only abnormal results are displayed) Labs Reviewed - No data to  display  EKG   Radiology DG Foot Complete Right Result Date: 12/31/2023 EXAM: 3 or more VIEW(S) XRAY OF THE RIGHT FOOT 12/31/2023 11:30:26 AM COMPARISON: None available. CLINICAL HISTORY: Right heel pain and swelling for 1 week, history of gout, no trauma/injuries recently. FINDINGS: BONES AND JOINTS: Large superior and inferior calcaneal spurs. Mild osteoarthritis of the first metatarsophalangeal joint. No acute fracture. No joint dislocation. SOFT TISSUES: Dystrophic calcifications within the lower leg. Vascular calcifications noted. IMPRESSION: 1. Large superior and inferior calcaneal spurs. Electronically signed by: Lonni Necessary MD 12/31/2023 11:55 AM EST RP Workstation: [id number]    Procedures Procedures (including critical care time)  Medications Ordered in UC Medications - No data to display  Initial Impression / Assessment and Plan / UC Course  I have reviewed the triage vital signs and the nursing notes.  Pertinent labs & imaging results that were available during my care of the patient were reviewed by me and considered in  my medical decision making (see chart for details).   1.  Inflammatory heel pain of right foot, calcaneal spur of right foot Presentation is consistent with inflammatory causes of right heel pain secondary to arthritis with likely underlying gout flare of the right heel. Right foot x-ray shows large superior and inferior calcaneal spurs without acute bony abnormality.  History of type 2 diabetes, most recent A1c was 8.4. I would like to treat with prednisone  40 mg once daily for 5 days with food to reduce inflammation and pain. We discussed that this steroid course may cause temporary increase in her blood sugars, advised to watch her diet over the next 2 to 3 days. We discussed avoidance of gout triggers in the diet. Ace wrap applied in clinic for compression to reduce swelling. She is neurovascularly intact to distal right lower extremity.  I  would like for her to follow-up with [facility] for ongoing evaluation and management of right heel pain.  Counseled patient on potential for adverse effects with medications prescribed/recommended today, strict ER and return-to-clinic precautions discussed, patient verbalized understanding.    Final Clinical Impressions(s) / UC Diagnoses   Final diagnoses:  Inflammatory heel pain, right  Calcaneal spur of right foot     Discharge Instructions      X-ray of your heel shows a right heel spur and soft tissue swelling about the right heel.  Take 2 pills of prednisone  (40 mg) once daily for the next 5 days with food in the morning. You may take your first dose today after you pick up your medication from the pharmacy, just make sure that you take the medicine with food.  The prednisone  may cause a temporary increase in your blood sugars, your blood sugars will return to normal once you stop taking the prednisone .  We put an Ace wrap on your right heel today to help with the swelling.  Please elevate your foot and apply ice 20 minutes on 20 minutes off to the heel to reduce inflammation and pain.  Please schedule an appointment with your primary care provider for follow-up. You may also find it helpful to see a podiatrist. A good podiatrist in the area is [facility], their phone number is listed on your paperwork below.   If you develop any new or worsening symptoms or if your symptoms do not start to improve, please return here or follow-up with your primary care provider. If your symptoms are severe, please go to the emergency room.   ED Prescriptions   None    PDMP not reviewed this encounter.   Enedelia Dorna HERO, [place] 12/31/23 1212

## 2023-12-31 NOTE — Discharge Instructions (Addendum)
 X-ray of your heel shows a right heel spur and soft tissue swelling about the right heel.  Take 2 pills of prednisone  (40 mg) once daily for the next 5 days with food in the morning. You may take your first dose today after you pick up your medication from the pharmacy, just make sure that you take the medicine with food.  The prednisone  may cause a temporary increase in your blood sugars, your blood sugars will return to normal once you stop taking the prednisone .  We put an Ace wrap on your right heel today to help with the swelling.  Please elevate your foot and apply ice 20 minutes on 20 minutes off to the heel to reduce inflammation and pain.  Please schedule an appointment with your primary care provider for follow-up. You may also find it helpful to see a podiatrist. A good podiatrist in the area is [facility], their phone number is listed on your paperwork below.   If you develop any new or worsening symptoms or if your symptoms do not start to improve, please return here or follow-up with your primary care provider. If your symptoms are severe, please go to the emergency room.

## 2023-12-31 NOTE — Therapy (Deleted)
 OUTPATIENT PHYSICAL THERAPY LOWER EXTREMITY TREATMENT & UPOC NOTE    Patient Name: [name] MRN: [id number] DOB:06-21-1955, 68 y.o., female Today's Date: 12/31/2023     END OF SESSION:     Past Medical History:  Diagnosis Date   Arthritis    Asthma    CHF (congestive heart failure) (HCC)    Coronary artery disease    Diabetes mellitus    Diabetic neuropathy (HCC)    Dysrhythmia    A.fib    takes Eliquis    GERD (gastroesophageal reflux disease)    History of hiatal hernia    History of kidney stones    Hx of cardiovascular stress test    Lexiscan  Myoview (10/15):  Medium area of ischemia in AL and IL distribution, cannot completely exclude shifting breast attenuation, EF 64%; Abnormal study   Hyperlipidemia    Hypertension    Mild carotid artery disease    Morbid obesity (HCC)    PAF (paroxysmal atrial fibrillation) (HCC)    Sleep apnea    does not wear CPAP   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   Syncope    Past Surgical History:  Procedure Laterality Date   ACHILLES TENDON REPAIR  1995   right   BIOPSY  07/22/2021   Procedure: BIOPSY;  Surgeon: Saintclair Jasper, MD;  Location: [facility];  Service: Gastroenterology;;   CATARACT EXTRACTION Bilateral    COLONOSCOPY     COLONOSCOPY WITH PROPOFOL  N/A 07/22/2021   Procedure: COLONOSCOPY WITH PROPOFOL ;  Surgeon: Saintclair Jasper, MD;  Location: [facility];  Service: Gastroenterology;  Laterality: N/A;   CYSTOSCOPY W/ RETROGRADES Right 06/15/2023   Procedure: CYSTOSCOPY, WITH RETROGRADE PYELOGRAM;  Surgeon: Shane Steffan BROCKS, MD;  Location: [facility];  Service: Urology;  Laterality: Right;   CYSTOSCOPY/URETEROSCOPY/HOLMIUM LASER/STENT PLACEMENT Right 06/15/2023   Procedure: CYSTOSCOPY/URETEROSCOPY/HOLMIUM LASER/STENT PLACEMENT;  Surgeon: Shane Steffan BROCKS, MD;  Location: [facility];  Service: Urology;  Laterality: Right;   DILATION AND CURETTAGE OF UTERUS     ESOPHAGOGASTRODUODENOSCOPY (EGD) WITH PROPOFOL  N/A 07/22/2021    Procedure: ESOPHAGOGASTRODUODENOSCOPY (EGD) WITH PROPOFOL ;  Surgeon: Saintclair Jasper, MD;  Location: [facility];  Service: Gastroenterology;  Laterality: N/A;   EYE SURGERY Left    detached retina repair   HAND SURGERY Right    right  pinky finger   ORIF   heart stent  2007   PARS PLANA VITRECTOMY Left 03/24/2017   Procedure: PARS PLANA VITRECTOMY WITH 25 GAUGE;  Surgeon: Elner Arley LABOR, MD;  Location: [facility];  Service: Ophthalmology;  Laterality: Left;   POLYPECTOMY  07/22/2021   Procedure: POLYPECTOMY;  Surgeon: Saintclair Jasper, MD;  Location: [facility];  Service: Gastroenterology;;   UTERINE FIBROID SURGERY  2008   Patient Active Problem List   Diagnosis Date Noted   Paroxysmal atrial fibrillation with RVR (HCC) 11/28/2020   Hypertensive urgency 11/28/2020   Syncope and collapse 11/28/2020   Nausea 11/28/2020   Hypokalemia 11/28/2020   Hyperglycemia due to diabetes mellitus (HCC) 11/28/2020   GERD (gastroesophageal reflux disease) 11/28/2020   Asthma 11/28/2020   Coronary atherosclerosis of native coronary artery 07/17/2013   Mixed hyperlipidemia 07/17/2013   Essential hypertension, benign 07/17/2013   Obesity 08/11/2011    PCP: Alberta Sharps  REFERRING PROVIDER: Marcey Her  REFERRING DIAG: Pain in L knee  THERAPY DIAG:  No diagnosis found.  Rationale for Evaluation and Treatment: Rehabilitation  ONSET DATE: chronic  SUBJECTIVE:   SUBJECTIVE STATEMENT:  Getting gel injection starting today. Feel pretty decent today  EVAL: My  knees hurt, the left one more. It is to the point where I can't step up or down on the L side. It has started making the back on the R side hurt now. They put a shot in it in June, it doesn't hurt like it did but I still don't have use of it.   PERTINENT HISTORY: Arthritis, CHF, DM, CAD, PAF, Stroke in 2022  PAIN:  Are you having pain? Yes: NPRS scale: 4/10 Pain location: L knee  Pain description: just hurts  Aggravating factors:  walking, sitting or laying for too long and then trying to get up, depends on position, sometimes has no rhythm to it  Relieving factors: finding the right position but making sure I still move enough   PRECAUTIONS: None  RED FLAGS: None   WEIGHT BEARING RESTRICTIONS: No  FALLS:  Has patient fallen in last 6 months? No  LIVING ENVIRONMENT: Lives with: lives alone Lives in: House/apartment Stairs: Yes: Internal: steps into the basement steps; on left going up and External: 2 steps; on left going up   OCCUPATION: retired from social services   PLOF: Independent and Independent with basic ADLs  PATIENT GOALS:  to get rid of the pain and be able to walk and move again   NEXT MD VISIT:   OBJECTIVE:  Note: Objective measures were completed at Evaluation unless otherwise noted.  DIAGNOSTIC FINDINGS:   COGNITION: Overall cognitive status: Within functional limits for tasks assessed     SENSATION: WFL   MUSCLE LENGTH: Hamstrings: some tightness in BLE  POSTURE: rounded shoulders  LOWER EXTREMITY ROM: WFL Some pain with hip flexion on both sides in R side LBP Some pain with end range flexion on L knee  LOWER EXTREMITY MMT:  MMT Right eval Left eval 10/2 R  10/2 L  Right 12/01/23 Left 12/01/23  Hip flexion 4- 5 4+ 4+ 5 5  Hip extension        Hip abduction        Hip adduction        Hip internal rotation        Hip external rotation        Knee flexion 4+ 4- 5 4+ pain  5 4+  Knee extension 4+ 4+ 5 4- pain  5 4- p!  Ankle dorsiflexion        Ankle plantarflexion        Ankle inversion        Ankle eversion         (Blank rows = not tested)   FUNCTIONAL TESTS:  5 times sit to stand: 24s; 10/14/23 26.8 seconds no UEs; 11/11/23- 19.8 seconds no UEs   Timed up and go (TUG): 22s; 10/14/23 17 seconds; 11/11/23 16.8 seconds no device    GAIT: Distance walked: in clinic distances Assistive device utilized: None Level of assistance: Complete Independence and Modified  independence Comments: antalgic gait, weight shifting on the RLE, taking quick steps with LLE to avoid using it and weight bearing  TREATMENT DATE:  12/29/23 NuStep L 5 x 7 min Gait around the island on L side to the front of the therapy building  Pt required frequent standing rest breaks due to fatigue, R hip and low back pain  Increase time required  Sit to stand 2x10 LAQ 2lb 2x10   12/17/23 Feet on ball K2C, rotation, small bridge posterior activation, isometric abs PAssive stretch to the LE's STM with the tgun to the left quad and thigh Nustep level 5 x 7 minutes 2.5# marches 2.5# LAQ Ball b/n knees squeeze 20# HS curls  12/09/23 Nustep Lvl 5 for 6 min Circuit 1: (2 rounds) - Seated Marches with 2.5# AW x 10 each - Seated LAQ with 2.5# AW x 8 each - Seated Ball Squeeze x 12 5xSTS - 24.56 seconds Circuit 2: (2 rounds) - Seated Rows with green tband x 8 - Seated Shoulder ER with yellow tband x 10 - Ambulate around the clinic 150 ft  12/06/23 HS curls 20lb 2x10 Leg Ext lb 2x10  NuStep L5 x 6 min 20lb resisted side steps x5 each Sit to stands x10 holding red ball   12/01/23 NuStep L 5 x 7 min  Goals  Stairs training 2 rails 4in alt pattern, L knee weakness and pain Sit to stands 2x10 holding red ball  HS curls 20lb 2x10   11/29/23 NuStep L 5 x 6 min 5X S2S 20.42 sec TUG 13 sec 20lb Resisted gait 4 way x 3 each  3# LAQ 2 sets 10 Standing march w/ 3lb Cuff 2x10   11/22/23:  Nustep lvl 5 for 6 min Seated Clamshells with Yellow Theraband 2 x 12 Seated Ball Squeezes 2 x 12 Seated LAQ with 2 lb AW x 10 each side Seated Marches with 2lbs x 10 each side Cable Pulley Shoulder Extension 5 lbs 2 x 10  Cable Pulley Biceps Curl 5 lbs 2 x 10 Seated Pelvic Tilts  11/18/23: Supine PPT x 10 with 5 second holds Supine Pball HS Curls 2 x  10 Seated Pball Abdominals 2 x 10 with 3 second holds Supine Pball Trunk Rotation x 10 each way Standing resisted Hip flx, abd, and ext in // bars with yellow tband 3 x 5 each way for SL balance Nustep Lvl 5 for 6 min  11/16/23 3# LAQ 2 sets 10 3# hip flex and abd seated 2 sets 10 Green tband HS curl 2 sets 10 STS 10 x with yellow wt ball Nustep L 5 6 min 20# resisted giat 5 x fwd and backward, 23 x each side   11/11/23  TUG, MMT, 5xSTS, stair assessment, goals, POC and education on all   Nustep L2x6 minutes BLEs only seat 8  Bridges x10 Seated SLRs x10 B 0# STS + yellow TB around knees x10   11/05/23 Pt seen for aquatic therapy today.  Treatment took place in water  3.5-4.75 ft in depth at the [facility] pool. Temp of water  was 91.  Pt entered/exited the pool via lift (for safety Pt afraid of water ) with hand rail.  Exercises - Walking  - Standing 'L' Stretch at [employer]   - forward march with knee kick  - Heel Toe Raises at [facility]   - Kick Leg Out To The Side  - Standing March at [facility]   - Squat - Holding to pool wall bend knee towards buttocks   - Leg swings flex/ext     Pt requires the buoyancy and hydrostatic pressure of  water  for support, and to offload joints by unweighting joint load by at least 50 % in navel deep water  and by at least 75-80% in chest to neck deep water .  Viscosity of the water  is needed for resistance of strengthening. Water  current perturbations provides challenge to standing balance requiring increased core activation.     PATIENT EDUCATION:  Education details: POC, HEP Person educated: Patient Education method: Explanation Education comprehension: verbalized understanding  HOME EXERCISE PROGRAM: Access Code: [email or web address] URL: [email or web address] Date: 09/16/2023 Prepared by: Almetta Fam  Exercises - Sit to Stand  - 1 x daily - 7 x weekly - 2 sets - 10 reps - Seated Long Arc Quad  - 1 x daily - 7 x weekly  - 2 sets - 10 reps - 3 hold - Heel Raises with Counter Support  - 1 x daily - 7 x weekly - 2 sets - 10 reps - Supine Lower Trunk Rotation  - 1 x daily - 7 x weekly - 2 sets - 10 reps - Supine Bridge  - 1 x daily - 7 x weekly - 2 sets - 10 reps   Aquatic This aquatic home exercise program from Med[employer] utilizes pictures from land based exercises, but has been adapted prior to lamination and issuance.   Access Code: [email or web address] URL: [email or web address] Date: 11/08/2023 Prepared by: Frankie Ziemba  Exercises - Walking  - 1 x daily - 1-3 x weekly - 1-2 sets - 10 reps - Standing 'L' Stretch at [employer]  - 1 x daily - 1-3 x weekly - 1-2 sets - 10 reps - forward march with knee kick  - 1 x daily - 1-3 x weekly - Heel Toe Raises at Pool Wall  - 1 x daily - 1-3 x weekly - 1-2 sets - 10 reps - Kick Leg Out To The Side  - 1 x daily - 1-3 x weekly - 1-2 sets - 10 reps - Standing March at [facility]  - 1 x daily - 1-3 x weekly - 1-2 sets - 10 reps - Squat  - 1 x daily - 1-3 x weekly - 1-2 sets - 10 reps - Holding to pool wall bend knee towards buttocks  - 1 x daily - 1-3 x weekly - 1-2 sets - 10 reps - Leg swings flex/ext  - 1 x daily - 1-3 x weekly - 1-2 sets - 10 reps ASSESSMENT:  CLINICAL IMPRESSION:    Pt enters with some L knee pain. Session focused on increasing functional endurance. Increase time and multiple rest breaks needed with outdoor ambulation. Pt was surprised that her knee didn't bother her as much with gait. Would continue to benefit from skilled PT to address the deficits found in order to improve tolerance to activities and ADLs.  Eval:Patient is a 68 y.o. female who was seen today for physical therapy evaluation and treatment for chronic knee pain. She has pain on both sides but it is mostly the L knee that bothers her. Patient reports walking increases her pain and she is unable to do steps going up or down with the LLE. She compensates with her RLE when walking  and due to this it has started causing pain in her R lower back. She has had shots in the L knee and it seemed to help for a little bit but she still has pain mostly with any standing and walking activities. Patient will benefit from PT to address her L knee pain as well as the  back pain to increase her activity tolerance and improve strength to allow ease with ADLs.  OBJECTIVE IMPAIRMENTS: Abnormal gait, decreased activity tolerance, decreased balance, decreased endurance, difficulty walking, decreased strength, postural dysfunction, and pain.   ACTIVITY LIMITATIONS: squatting, stairs, and locomotion level  PARTICIPATION LIMITATIONS: meal prep, cleaning, laundry, driving, shopping, community activity, and yard work  PERSONAL FACTORS: Age, Fitness, Past/current experiences, and Time since onset of injury/illness/exacerbation are also affecting patient's functional outcome.   REHAB POTENTIAL: Good  CLINICAL DECISION MAKING: Stable/uncomplicated  EVALUATION COMPLEXITY: Low  GOALS: Goals reviewed with patient? Yes  SHORT TERM GOALS: Target date: 10/28/23  Patient will be independent with initial HEP. Baseline:  Goal status: MET 10/14/23  2.  Patient will decrease TUG <14s Baseline: 22s Goal status: IN PROGRESS 11/11/23, Met 13 sec    LONG TERM GOALS: Target date: 12/09/23  Patient will be independent with advanced/ongoing HEP to improve outcomes and carryover.  Baseline:  Goal status: IN PROGRESS 10/14/23;11/08/23 (Met in aquatics)  2.  Patient will report at least 75% improvement in  knee pain to improve QOL. (Pain <4/10) Baseline: 8/10 Goal status: IN PROGRESS 11/11/23 50% per pt, Progressing 5/10 12/01/23, progressing 5/10 12/29/23  3.  Patient will demonstrate improved functional LE strength as demonstrated by 5xSTS <15s. Baseline: 24s Goal status: IN PROGRESS 11/11/23, Met 11/29/23 20.42 sec   4. Patient will be able to ascend/descend stairs with 1 HR and reciprocal step pattern  safely to access home and community.  Baseline:  Goal status: IN PROGRESS 11/11/23, ongoing 12/01/23  5.  Patient will tolerate at least 30 min of standing and walking. Baseline: 5-10 mins Goal status: IN PROGRESS 11/11/23 maybe I could do 20, Partly Met I think I have done 30 min 12/01/23 progresng 12/17/23  PLAN:  PT FREQUENCY: 2x/week  PT DURATION: 12 weeks  PLANNED INTERVENTIONS: 97110-Therapeutic exercises, 97530- Therapeutic activity, 97112- Neuromuscular re-education, 97535- Self Care, [id number]- Manual therapy, [phone]- Gait training, [phone]- Vasopneumatic device, [id number]- Ultrasound, [id number]- Ionotophoresis 4mg /ml Dexamethasone , [id number] (1-2 muscles), [id number] (3+ muscles)- Dry Needling, Patient/Family education, Balance training, Stair training, Taping, Joint mobilization, Joint manipulation, Spinal manipulation, Spinal mobilization, Cryotherapy, and Moist heat  PLAN FOR NEXT SESSION:   Progressive PREs, lumbar and hip mobility/flexibility, core, balance  She is scheduled for gel injections 11/19, 11/26 and 12/3, I did ask her to ask the MD regarding PT appointments as she has a PT appointment on the 21st.  Ozell Mainland, PT 12/31/23 8:52 AM

## 2023-12-31 NOTE — ED Triage Notes (Addendum)
 Pt c/o right bottom foot heel pain since Monday. Denies any known injury

## 2023-12-31 NOTE — Telephone Encounter (Signed)
 Telephone encounter created to send prescription for prednisone  as discussed at urgent care visit today.

## 2024-01-04 ENCOUNTER — Encounter: Payer: Self-pay | Admitting: Physical Therapy

## 2024-01-04 ENCOUNTER — Ambulatory Visit: Admitting: Physical Therapy

## 2024-01-04 DIAGNOSIS — G8929 Other chronic pain: Secondary | ICD-10-CM

## 2024-01-04 DIAGNOSIS — M25561 Pain in right knee: Secondary | ICD-10-CM | POA: Diagnosis not present

## 2024-01-04 DIAGNOSIS — M5459 Other low back pain: Secondary | ICD-10-CM

## 2024-01-04 NOTE — Therapy (Signed)
 OUTPATIENT PHYSICAL THERAPY LOWER EXTREMITY TREATMENT    Patient Name: [name] MRN: [id number] DOB:26-Nov-1955, 68 y.o., female Today's Date: 01/04/2024     END OF SESSION:  PT End of Session - 01/04/24 1020     Visit Number 25    Date for Recertification  02/03/24    PT Start Time 1015    PT Stop Time 1100    PT Time Calculation (min) 45 min    Activity Tolerance Patient tolerated treatment well;Patient limited by fatigue;Patient limited by pain    Behavior During Therapy WFL for tasks assessed/performed            Past Medical History:  Diagnosis Date   Arthritis    Asthma    CHF (congestive heart failure) (HCC)    Coronary artery disease    Diabetes mellitus    Diabetic neuropathy (HCC)    Dysrhythmia    A.fib    takes Eliquis    GERD (gastroesophageal reflux disease)    History of hiatal hernia    History of kidney stones    Hx of cardiovascular stress test    Lexiscan  Myoview (10/15):  Medium area of ischemia in AL and IL distribution, cannot completely exclude shifting breast attenuation, EF 64%; Abnormal study   Hyperlipidemia    Hypertension    Mild carotid artery disease    Morbid obesity (HCC)    PAF (paroxysmal atrial fibrillation) (HCC)    Sleep apnea    does not wear CPAP   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   Syncope    Past Surgical History:  Procedure Laterality Date   ACHILLES TENDON REPAIR  1995   right   BIOPSY  07/22/2021   Procedure: BIOPSY;  Surgeon: Saintclair Jasper, MD;  Location: [facility];  Service: Gastroenterology;;   CATARACT EXTRACTION Bilateral    COLONOSCOPY     COLONOSCOPY WITH PROPOFOL  N/A 07/22/2021   Procedure: COLONOSCOPY WITH PROPOFOL ;  Surgeon: Saintclair Jasper, MD;  Location: [facility];  Service: Gastroenterology;  Laterality: N/A;   CYSTOSCOPY W/ RETROGRADES Right 06/15/2023   Procedure: CYSTOSCOPY, WITH RETROGRADE PYELOGRAM;  Surgeon: Shane Steffan BROCKS, MD;  Location: [facility];  Service: Urology;  Laterality:  Right;   CYSTOSCOPY/URETEROSCOPY/HOLMIUM LASER/STENT PLACEMENT Right 06/15/2023   Procedure: CYSTOSCOPY/URETEROSCOPY/HOLMIUM LASER/STENT PLACEMENT;  Surgeon: Shane Steffan BROCKS, MD;  Location: [facility];  Service: Urology;  Laterality: Right;   DILATION AND CURETTAGE OF UTERUS     ESOPHAGOGASTRODUODENOSCOPY (EGD) WITH PROPOFOL  N/A 07/22/2021   Procedure: ESOPHAGOGASTRODUODENOSCOPY (EGD) WITH PROPOFOL ;  Surgeon: Saintclair Jasper, MD;  Location: [facility];  Service: Gastroenterology;  Laterality: N/A;   EYE SURGERY Left    detached retina repair   HAND SURGERY Right    right  pinky finger   ORIF   heart stent  2007   PARS PLANA VITRECTOMY Left 03/24/2017   Procedure: PARS PLANA VITRECTOMY WITH 25 GAUGE;  Surgeon: Elner Arley LABOR, MD;  Location: [facility];  Service: Ophthalmology;  Laterality: Left;   POLYPECTOMY  07/22/2021   Procedure: POLYPECTOMY;  Surgeon: Saintclair Jasper, MD;  Location: [facility];  Service: Gastroenterology;;   UTERINE FIBROID SURGERY  2008   Patient Active Problem List   Diagnosis Date Noted   Paroxysmal atrial fibrillation with RVR (HCC) 11/28/2020   Hypertensive urgency 11/28/2020   Syncope and collapse 11/28/2020   Nausea 11/28/2020   Hypokalemia 11/28/2020   Hyperglycemia due to diabetes mellitus (HCC) 11/28/2020   GERD (gastroesophageal reflux disease) 11/28/2020   Asthma 11/28/2020   Coronary atherosclerosis  of native coronary artery 07/17/2013   Mixed hyperlipidemia 07/17/2013   Essential hypertension, benign 07/17/2013   Obesity 08/11/2011    PCP: Alberta Sharps  REFERRING PROVIDER: Marcey Her  REFERRING DIAG: Pain in L knee  THERAPY DIAG:  Chronic pain of left knee  Bilateral chronic knee pain  Other low back pain  Rationale for Evaluation and Treatment: Rehabilitation  ONSET DATE: chronic  SUBJECTIVE:   SUBJECTIVE STATEMENT:  Getting second gel injection in her knee tomorrow  EVAL: My knees hurt, the left one more. It is to the point where I  can't step up or down on the L side. It has started making the back on the R side hurt now. They put a shot in it in June, it doesn't hurt like it did but I still don't have use of it.   PERTINENT HISTORY: Arthritis, CHF, DM, CAD, PAF, Stroke in 2022  PAIN:  Are you having pain? Yes: NPRS scale: 5/10 Pain location: R heel Pain description: just hurts  Aggravating factors: walking, sitting or laying for too long and then trying to get up, depends on position, sometimes has no rhythm to it  Relieving factors: finding the right position but making sure I still move enough   PRECAUTIONS: None  RED FLAGS: None   WEIGHT BEARING RESTRICTIONS: No  FALLS:  Has patient fallen in last 6 months? No  LIVING ENVIRONMENT: Lives with: lives alone Lives in: House/apartment Stairs: Yes: Internal: steps into the basement steps; on left going up and External: 2 steps; on left going up   OCCUPATION: retired from social services   PLOF: Independent and Independent with basic ADLs  PATIENT GOALS:  to get rid of the pain and be able to walk and move again   NEXT MD VISIT:   OBJECTIVE:  Note: Objective measures were completed at Evaluation unless otherwise noted.  DIAGNOSTIC FINDINGS:   COGNITION: Overall cognitive status: Within functional limits for tasks assessed     SENSATION: WFL   MUSCLE LENGTH: Hamstrings: some tightness in BLE  POSTURE: rounded shoulders  LOWER EXTREMITY ROM: WFL Some pain with hip flexion on both sides in R side LBP Some pain with end range flexion on L knee  LOWER EXTREMITY MMT:  MMT Right eval Left eval 10/2 R  10/2 L  Right 12/01/23 Left 12/01/23  Hip flexion 4- 5 4+ 4+ 5 5  Hip extension        Hip abduction        Hip adduction        Hip internal rotation        Hip external rotation        Knee flexion 4+ 4- 5 4+ pain  5 4+  Knee extension 4+ 4+ 5 4- pain  5 4- p!  Ankle dorsiflexion        Ankle plantarflexion        Ankle inversion         Ankle eversion         (Blank rows = not tested)   FUNCTIONAL TESTS:  5 times sit to stand: 24s; 10/14/23 26.8 seconds no UEs; 11/11/23- 19.8 seconds no UEs   Timed up and go (TUG): 22s; 10/14/23 17 seconds; 11/11/23 16.8 seconds no device    GAIT: Distance walked: in clinic distances Assistive device utilized: None Level of assistance: Complete Independence and Modified independence Comments: antalgic gait, weight shifting on the RLE, taking quick steps with LLE to avoid using it and  weight bearing                                                                                                                                 TREATMENT DATE:  01/04/24 NuStep L 5 x 7 min Stair training 2 rails alt pattern 4 & 6 inch steps  Hesitant to control decent with LLE Sit to stands holding red ball 2x10 LAQ 3lb 2x10 3lb Seated march 2x10 HS curls green 2x10  12/29/23 NuStep L 5 x 7 min Gait around the [place] on L side to the front of the therapy building  Pt required frequent standing rest breaks due to fatigue, R hip and low back pain  Increase time required  Sit to stand 2x10 LAQ 2lb 2x10   12/17/23 Feet on ball K2C, rotation, small bridge posterior activation, isometric abs PAssive stretch to the LE's STM with the tgun to the left quad and thigh Nustep level 5 x 7 minutes 2.5# marches 2.5# LAQ Ball b/n knees squeeze 20# HS curls  12/09/23 Nustep Lvl 5 for 6 min Circuit 1: (2 rounds) - Seated Marches with 2.5# AW x 10 each - Seated LAQ with 2.5# AW x 8 each - Seated Ball Squeeze x 12 5xSTS - 24.56 seconds Circuit 2: (2 rounds) - Seated Rows with green tband x 8 - Seated Shoulder ER with yellow tband x 10 - Ambulate around the clinic 150 ft  12/06/23 HS curls 20lb 2x10 Leg Ext lb 2x10  NuStep L5 x 6 min 20lb resisted side steps x5 each Sit to stands x10 holding red ball   12/01/23 NuStep L 5 x 7 min  Goals  Stairs training 2 rails 4in alt pattern, L knee  weakness and pain Sit to stands 2x10 holding red ball  HS curls 20lb 2x10   11/29/23 NuStep L 5 x 6 min 5X S2S 20.42 sec TUG 13 sec 20lb Resisted gait 4 way x 3 each  3# LAQ 2 sets 10 Standing march w/ 3lb Cuff 2x10   11/22/23:  Nustep lvl 5 for 6 min Seated Clamshells with Yellow Theraband 2 x 12 Seated Ball Squeezes 2 x 12 Seated LAQ with 2 lb AW x 10 each side Seated Marches with 2lbs x 10 each side Cable Pulley Shoulder Extension 5 lbs 2 x 10  Cable Pulley Biceps Curl 5 lbs 2 x 10 Seated Pelvic Tilts  11/18/23: Supine PPT x 10 with 5 second holds Supine Pball HS Curls 2 x 10 Seated Pball Abdominals 2 x 10 with 3 second holds Supine Pball Trunk Rotation x 10 each way Standing resisted Hip flx, abd, and ext in // bars with yellow tband 3 x 5 each way for SL balance Nustep Lvl 5 for 6 min  11/16/23 3# LAQ 2 sets 10 3# hip flex and abd seated 2 sets 10 Green tband HS curl 2 sets 10 STS 10 x with yellow wt ball Nustep L  5 6 min 20# resisted giat 5 x fwd and backward, 23 x each side   11/11/23  TUG, MMT, 5xSTS, stair assessment, goals, POC and education on all   Nustep L2x6 minutes BLEs only seat 8  Bridges x10 Seated SLRs x10 B 0# STS + yellow TB around knees x10   11/05/23 Pt seen for aquatic therapy today.  Treatment took place in water  3.5-4.75 ft in depth at the [facility] pool. Temp of water  was 91.  Pt entered/exited the pool via lift (for safety Pt afraid of water ) with hand rail.  Exercises - Walking  - Standing 'L' Stretch at [employer]   - forward march with knee kick  - Heel Toe Raises at [facility]   - Kick Leg Out To The Side  - Standing March at [facility]   - Squat - Holding to pool wall bend knee towards buttocks   - Leg swings flex/ext     Pt requires the buoyancy and hydrostatic pressure of water  for support, and to offload joints by unweighting joint load by at least 50 % in navel deep water  and by at least 75-80% in chest to  neck deep water .  Viscosity of the water  is needed for resistance of strengthening. Water  current perturbations provides challenge to standing balance requiring increased core activation.     PATIENT EDUCATION:  Education details: POC, HEP Person educated: Patient Education method: Explanation Education comprehension: verbalized understanding  HOME EXERCISE PROGRAM: Access Code: [email or web address] URL: [email or web address] Date: 09/16/2023 Prepared by: Almetta Fam  Exercises - Sit to Stand  - 1 x daily - 7 x weekly - 2 sets - 10 reps - Seated Long Arc Quad  - 1 x daily - 7 x weekly - 2 sets - 10 reps - 3 hold - Heel Raises with Counter Support  - 1 x daily - 7 x weekly - 2 sets - 10 reps - Supine Lower Trunk Rotation  - 1 x daily - 7 x weekly - 2 sets - 10 reps - Supine Bridge  - 1 x daily - 7 x weekly - 2 sets - 10 reps   Aquatic This aquatic home exercise program from Med[employer] utilizes pictures from land based exercises, but has been adapted prior to lamination and issuance.   Access Code: [email or web address] URL: [email or web address] Date: 11/08/2023 Prepared by: Frankie Ziemba  Exercises - Walking  - 1 x daily - 1-3 x weekly - 1-2 sets - 10 reps - Standing 'L' Stretch at [employer]  - 1 x daily - 1-3 x weekly - 1-2 sets - 10 reps - forward march with knee kick  - 1 x daily - 1-3 x weekly - Heel Toe Raises at Pool Wall  - 1 x daily - 1-3 x weekly - 1-2 sets - 10 reps - Kick Leg Out To The Side  - 1 x daily - 1-3 x weekly - 1-2 sets - 10 reps - Standing March at [facility]  - 1 x daily - 1-3 x weekly - 1-2 sets - 10 reps - Squat  - 1 x daily - 1-3 x weekly - 1-2 sets - 10 reps - Holding to pool wall bend knee towards buttocks  - 1 x daily - 1-3 x weekly - 1-2 sets - 10 reps - Leg swings flex/ext  - 1 x daily - 1-3 x weekly - 1-2 sets - 10 reps ASSESSMENT:  CLINICAL IMPRESSION:    Pt enters with some R heel  pain. Some progress had been made towards goals  reporting less pain. PT unable to complete stairs with alt patter ising 1 rail. Session focused on increasing functional endurance. Pt unwilling to attempt gait trial due to her being in the process of gel injections.  Would continue to benefit from skilled PT to address the deficits found in order to improve tolerance to activities and ADLs.  Eval:Patient is a 68 y.o. female who was seen today for physical therapy evaluation and treatment for chronic knee pain. She has pain on both sides but it is mostly the L knee that bothers her. Patient reports walking increases her pain and she is unable to do steps going up or down with the LLE. She compensates with her RLE when walking and due to this it has started causing pain in her R lower back. She has had shots in the L knee and it seemed to help for a little bit but she still has pain mostly with any standing and walking activities. Patient will benefit from PT to address her L knee pain as well as the back pain to increase her activity tolerance and improve strength to allow ease with ADLs.  OBJECTIVE IMPAIRMENTS: Abnormal gait, decreased activity tolerance, decreased balance, decreased endurance, difficulty walking, decreased strength, postural dysfunction, and pain.   ACTIVITY LIMITATIONS: squatting, stairs, and locomotion level  PARTICIPATION LIMITATIONS: meal prep, cleaning, laundry, driving, shopping, community activity, and yard work  PERSONAL FACTORS: Age, Fitness, Past/current experiences, and Time since onset of injury/illness/exacerbation are also affecting patient's functional outcome.   REHAB POTENTIAL: Good  CLINICAL DECISION MAKING: Stable/uncomplicated  EVALUATION COMPLEXITY: Low  GOALS: Goals reviewed with patient? Yes  SHORT TERM GOALS: Target date: 10/28/23  Patient will be independent with initial HEP. Baseline:  Goal status: MET 10/14/23  2.  Patient will decrease TUG <14s Baseline: 22s Goal status: IN PROGRESS 11/11/23,  Met 13 sec    LONG TERM GOALS: Target date: 12/09/23  Patient will be independent with advanced/ongoing HEP to improve outcomes and carryover.  Baseline:  Goal status: IN PROGRESS 10/14/23;11/08/23 (Met in aquatics)  2.  Patient will report at least 75% improvement in  knee pain to improve QOL. (Pain <4/10) Baseline: 8/10 Goal status: IN PROGRESS 11/11/23 50% per pt, Progressing 5/10 12/01/23, progressing 5/10 12/29/23, Progressing 4/10 01/04/24  3.  Patient will demonstrate improved functional LE strength as demonstrated by 5xSTS <15s. Baseline: 24s Goal status: IN PROGRESS 11/11/23, Met 11/29/23 20.42 sec   4. Patient will be able to ascend/descend stairs with 1 HR and reciprocal step pattern safely to access home and community.  Baseline:  Goal status: IN PROGRESS 11/11/23, ongoing 12/01/23  5.  Patient will tolerate at least 30 min of standing and walking. Baseline: 5-10 mins Goal status: IN PROGRESS 11/11/23 maybe I could do 20, Partly Met I think I have done 30 min 12/01/23 progresng 12/17/23  PLAN:  PT FREQUENCY: 2x/week  PT DURATION: 12 weeks  PLANNED INTERVENTIONS: 97110-Therapeutic exercises, 97530- Therapeutic activity, 97112- Neuromuscular re-education, 97535- Self Care, 02859- Manual therapy, [phone]- Gait training, [phone]- Vasopneumatic device, [id number]- Ultrasound, [id number]- Ionotophoresis 4mg /ml Dexamethasone , [id number] (1-2 muscles), [id number] (3+ muscles)- Dry Needling, Patient/Family education, Balance training, Stair training, Taping, Joint mobilization, Joint manipulation, Spinal manipulation, Spinal mobilization, Cryotherapy, and Moist heat  PLAN FOR NEXT SESSION:   Progressive PREs, lumbar and hip mobility/flexibility, core, balance  She is scheduled for gel injections 11/19, 11/26 and 12/3, I did ask her to ask the MD regarding PT appointments  as she has a PT appointment on the 21st.  Ozell Mainland, PT 01/04/24 10:21 AM

## 2024-01-07 ENCOUNTER — Ambulatory Visit
Admission: RE | Admit: 2024-01-07 | Discharge: 2024-01-07 | Disposition: A | Discharge status: Home / Self Care | Attending: Family Medicine

## 2024-01-07 ENCOUNTER — Other Ambulatory Visit: Payer: Self-pay

## 2024-01-07 ENCOUNTER — Telehealth: Payer: Self-pay

## 2024-01-07 VITALS — BP 139/82 | HR 91 | Temp 98.4°F | Resp 18 | Ht 62.5 in | Wt 270.1 lb

## 2024-01-07 DIAGNOSIS — M109 Gout, unspecified: Secondary | ICD-10-CM

## 2024-01-07 NOTE — Telephone Encounter (Signed)
 This RN returned patient's phone call at this time. Name and DOB verified. Pt states she is in pain and wanting her uric acid level checked. This RN advised pt she would have to return in-clinic for re-evaluation. Pt verbalized understanding. Appointment made for 6:30 PM today.

## 2024-01-07 NOTE — ED Notes (Addendum)
 Attempted blood draw x 2 in right hand. Unsuccessful.

## 2024-01-07 NOTE — Discharge Instructions (Addendum)
 VISIT SUMMARY:  You presented with severe pain in your right foot and ankle, which you suspect is a gout flare-up. You also have ongoing issues with osteoarthritis in your right knee and are managing type 2 diabetes. We discussed your symptoms, and I provided recommendations for managing your conditions.  YOUR PLAN:  -GOUT FLARE, RIGHT ANKLE AND FOOT: A gout flare is a sudden and severe onset of pain, redness, and swelling in a joint due to the accumulation of uric acid crystals. For your current flare, I have ordered a uric acid level test and recommended taking Tylenol  up to 3500 mg per day in divided doses. Additionally, I provided dietary changes to help manage your gout.  -TYPE 2 DIABETES MELLITUS: Type 2 diabetes is a chronic condition that affects the way your body processes blood sugar. We are avoiding prednisone  for your gout flare due to its potential impact on your blood glucose levels.  INSTRUCTIONS:  Follow up with the orthopedic specialist on December 3rd. Continue with your physical therapy exercises and adhere to the dietary changes provided to help manage your gout. Take Tylenol  up to 3500 mg per day in divided doses as needed for pain. We will review your uric acid levels once the test results are available.

## 2024-01-07 NOTE — ED Notes (Signed)
 Per Rocky PA, pt is to follow-up with primary care for blood draw.

## 2024-01-07 NOTE — ED Triage Notes (Signed)
 Pt presents to urgent care for follow-up. States she would like her uric acid checked + pain managed. Was recently dx with gout of right foot on 11/21. Currently rates her overall pain a 6/10. The prednisone  prescribed did help at the time. Per pt, ace wrap applied to right foot last visit. States was overwhelmed at the time and could not tell if it was helping her pain.

## 2024-01-07 NOTE — ED Provider Notes (Signed)
 [facility]    CSN: [id number] Arrival date & time: 01/07/24  1800      History   Chief Complaint Chief Complaint  Patient presents with  . Follow-up    HPI [name] is a 68 y.o. female.  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   HPI   Discussed the use of AI scribe software for clinical note transcription with the patient, who gave verbal consent to proceed.  The patient, with gout and diabetes, presents with right foot and ankle pain.  They experience severe pain in the right foot and ankle, with radiation down the leg, suspecting a gout flare-up. The pain was particularly intense yesterday. They have a history of gout, and were treated for this on 12/31/23 with prednisone .   Previously, they were prescribed prednisone  for the current flare-up, which provided some relief. Currently, they are not taking any pain medication but have used Tylenol  in the past. They are cautious about medication use due to their diabetic status.  They are undergoing therapy for their condition and have been using a cane on the opposite side of the affected leg. They are adjusting their walking pattern as part of their therapy for knee issues, described as 'bone on bone'.  They are awaiting a referral to a foot specialist and have a follow-up appointment for toenail care.  They actively participate in therapy exercises.    Past Medical History:  Diagnosis Date  . Arthritis   . Asthma   . CHF (congestive heart failure) (HCC)   . Coronary  artery disease   . Diabetes mellitus   . Diabetic neuropathy (HCC)   . Dysrhythmia    A.fib    takes Eliquis   . GERD (gastroesophageal reflux disease)   . History of hiatal hernia   . History of kidney stones   . Hx of cardiovascular stress test    Lexiscan  Myoview (10/15):  Medium area of ischemia in AL and IL distribution, cannot completely exclude shifting breast attenuation, EF 64%; Abnormal study  . Hyperlipidemia   . Hypertension   . Mild carotid artery disease   . Morbid obesity (HCC)   . PAF (paroxysmal atrial fibrillation) (HCC)   . Sleep apnea    does not wear CPAP  . 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  . Syncope     Patient Active Problem List   Diagnosis Date Noted  . Paroxysmal atrial fibrillation with RVR (HCC) 11/28/2020  . Hypertensive urgency 11/28/2020  . Syncope and collapse 11/28/2020  . Nausea 11/28/2020  . Hypokalemia 11/28/2020  . Hyperglycemia due to diabetes mellitus (HCC) 11/28/2020  . GERD (gastroesophageal reflux disease) 11/28/2020  . Asthma 11/28/2020  . Coronary atherosclerosis of native coronary artery 07/17/2013  . Mixed hyperlipidemia 07/17/2013  . Essential hypertension, benign 07/17/2013  . Obesity 08/11/2011    Past Surgical History:  Procedure Laterality Date  . ACHILLES TENDON REPAIR  1995   right  . BIOPSY  07/22/2021   Procedure: BIOPSY;  Surgeon: Saintclair Jasper, MD;  Location: [facility];  Service: Gastroenterology;;  . CATARACT EXTRACTION Bilateral   . COLONOSCOPY    . COLONOSCOPY WITH PROPOFOL  N/A 07/22/2021   Procedure: COLONOSCOPY WITH  PROPOFOL ;  Surgeon: Saintclair Jasper, MD;  Location: [facility];  Service: Gastroenterology;  Laterality: N/A;  . CYSTOSCOPY W/ RETROGRADES Right 06/15/2023   Procedure: CYSTOSCOPY, WITH RETROGRADE PYELOGRAM;  Surgeon: Shane Steffan BROCKS, MD;  Location: [facility];  Service: Urology;  Laterality: Right;  . CYSTOSCOPY/URETEROSCOPY/HOLMIUM LASER/STENT PLACEMENT Right 06/15/2023   Procedure:  CYSTOSCOPY/URETEROSCOPY/HOLMIUM LASER/STENT PLACEMENT;  Surgeon: Shane Steffan BROCKS, MD;  Location: [facility];  Service: Urology;  Laterality: Right;  . DILATION AND CURETTAGE OF UTERUS    . ESOPHAGOGASTRODUODENOSCOPY (EGD) WITH PROPOFOL  N/A 07/22/2021   Procedure: ESOPHAGOGASTRODUODENOSCOPY (EGD) WITH PROPOFOL ;  Surgeon: Saintclair Jasper, MD;  Location: [facility];  Service: Gastroenterology;  Laterality: N/A;  . EYE SURGERY Left    detached retina repair  . HAND SURGERY Right    right  pinky finger   ORIF  . heart stent  2007  . PARS PLANA VITRECTOMY Left 03/24/2017   Procedure: PARS PLANA VITRECTOMY WITH 25 GAUGE;  Surgeon: Elner Arley LABOR, MD;  Location: [facility];  Service: Ophthalmology;  Laterality: Left;  . POLYPECTOMY  07/22/2021   Procedure: POLYPECTOMY;  Surgeon: Saintclair Jasper, MD;  Location: [facility];  Service: Gastroenterology;;  . UTERINE FIBROID SURGERY  2008    OB History   No obstetric history on file.      Home Medications    Prior to Admission medications   Medication Sig Start Date End Date Taking? Authorizing Provider  acetaminophen  (TYLENOL ) 500 MG tablet Take 1,000 mg by mouth every 6 (six) hours as needed (pain.).    [provider]  albuterol  (VENTOLIN  HFA) 108 (90 Base) MCG/ACT inhaler Inhale 1-2 puffs into the lungs every 6 (six) hours as needed for wheezing or shortness of breath. 04/16/21   Christopher Savannah, PA-C  amLODipine  (NORVASC ) 10 MG tablet TAKE 1 TABLET BY MOUTH ONCE DAILY. PLEASE KEEP UPCOMING APPOINTMENT IN FEBRUARY 2024 FOR FUTURE REFILLS. THANK YOU 07/09/23   Dann Candyce RAMAN, MD  apixaban  (ELIQUIS ) 5 MG TABS tablet Take 1 tablet by mouth twice daily 07/13/23   Lonni Slain, MD  aspirin  EC 81 MG tablet Take 1 tablet (81 mg total) by mouth daily. 03/03/19   Dann Candyce RAMAN, MD  BREO ELLIPTA  100-25 MCG/INH AEPB Inhale 1 puff into the lungs in the morning. 09/21/16   [provider]  carvedilol  (COREG ) 6.25 MG tablet Take 1 tablet  by mouth twice daily 09/29/22   Varanasi, Jayadeep S, MD  Cholecalciferol (VITAMIN D3) 25 MCG (1000 UT) CAPS 1 capsule.    [provider]  colestipol (COLESTID) 1 g tablet Take 2 g by mouth daily in the afternoon. 05/24/23   [provider]  Cyanocobalamin (VITAMIN B12) 1000 MCG TABS 1 tablet Orally Once a day    [provider]  escitalopram (LEXAPRO) 5 MG tablet Take 5 mg by mouth daily at 8 pm. 11/22/20   [provider]  ezetimibe  (ZETIA ) 10 MG tablet Take 1 tablet by mouth once daily 09/29/22   Dann Candyce RAMAN, MD  fluticasone  (FLONASE ) 50 MCG/ACT nasal spray Place 2 sprays into both nostrils daily. 12/04/23   Rising, Asberry, PA-C  FREESTYLE LITE test strip 1 each by Other route as directed.  07/08/11   [provider]  furosemide  (LASIX ) 40 MG tablet Take 2 tablets (80 mg total) by mouth daily. 06/01/22   Dann Candyce RAMAN, MD  guaiFENesin  (MUCINEX ) 600 MG 12 hr tablet Take 2 tablets (1,200 mg total) by mouth 2 (two) times daily. 12/04/23   Rising,  Asberry, PA-C  HUMULIN R  U-500 KWIKPEN 500 UNIT/ML KwikPen Inject 30-80 Units into the skin See admin instructions. INJECT 80 UNITS SUBCUTANEOUSLY BEFORE BREAKFAST AND 30 UNITS BEFORE EVENING MEAL TWICE DAILY 30 MINUTES BEFORE MEALS 04/30/23   [provider]  hyoscyamine  (ANASPAZ ) 0.125 MG TBDP disintergrating tablet Place 1 tablet (0.125 mg total) under the tongue every 6 (six) hours as needed for up to 20 doses. 06/15/23   Shane Steffan BROCKS, MD  lisinopril  (ZESTRIL ) 40 MG tablet Take 1 tablet by mouth once daily 06/10/23   Lonni Slain, MD  methocarbamol  (ROBAXIN ) 750 MG tablet Take 750 mg by mouth 4 (four) times daily as needed (pain/spasms). 05/31/23   [provider]  Multiple Vitamin (MULTI VITAMIN) TABS 1 tablet Orally Once a day    [provider]  nitroGLYCERIN  (NITROSTAT ) 0.4 MG SL tablet Place 1 tablet (0.4 mg total) under the tongue every 5 (five) minutes  as needed for chest pain (X 3 DOSES FOR CHEST PAIN). 09/29/22   Dann Candyce RAMAN, MD  OZEMPIC, 2 MG/DOSE, 8 MG/3ML SOPN Inject 2 mg into the skin every Sunday. 05/25/23   [provider]  potassium chloride  (KLOR-CON ) 10 MEQ tablet Take 1 tablet (10 mEq total) by mouth in the morning and at bedtime. 10/12/23   Lonni Slain, MD  Povidone, PF, (IVIZIA DRY EYES) 0.5 % SOLN Place 1 drop into both eyes daily.    [provider]  rosuvastatin  (CRESTOR ) 40 MG tablet Take 1 tablet by mouth once daily 07/09/23   Lonni Slain, MD  tamsulosin  (FLOMAX ) 0.4 MG CAPS capsule Take 1 capsule (0.4 mg total) by mouth daily after supper. 06/15/23   Shane Steffan BROCKS, MD    Family History Family History  Problem Relation Age of Onset  . Heart disease Mother   . Kidney disease Father   . Stroke Father   . Heart attack Father   . Heart disease Brother   . Heart disease Brother   . Stroke Maternal Grandfather   . Stroke Paternal Grandfather   . Asthma Other   . Hypertension Other   . Diabetes Other     Social History Social History   Tobacco Use  . Smoking status: Former    Current packs/day: 0.00    Types: Cigarettes    Quit date: 08/07/1982    Years since quitting: 41.4  . Smokeless tobacco: Never  Vaping Use  . Vaping status: Never Used  Substance Use Topics  . Alcohol use: No  . Drug use: No     Allergies   Bystolic [nebivolol hcl], Crestor  [rosuvastatin ], Other, Sulfa antibiotics, Bystolic [nebivolol hcl], Invokana [canagliflozin], Januvia [sitagliptin], Jardiance [empagliflozin], Keflex  [cephalexin ], Metformin and related, and Mobic [meloxicam]   Review of Systems Review of Systems  Musculoskeletal:        Right foot pain  Right ankle pain      Physical Exam Triage Vital Signs ED Triage Vitals  Encounter Vitals Group     BP 01/07/24 1903 139/82     Girls Systolic BP Percentile --      Girls Diastolic BP Percentile --      Boys Systolic BP  Percentile --      Boys Diastolic BP Percentile --      Pulse Rate 01/07/24 1903 91     Resp 01/07/24 1903 18     Temp 01/07/24 1903 98.4 F (36.9 C)     Temp Source 01/07/24 1903 Oral     SpO2 01/07/24  1903 94 %     Weight 01/07/24 1906 270 lb 1 oz (122.5 kg)     Height 01/07/24 1906 5' 2.5 (1.588 m)     Head Circumference --      Peak Flow --      Pain Score 01/07/24 1905 6     Pain Loc --      Pain Education --      Exclude from Growth Chart --    No data found.  Updated Vital Signs BP 139/82 (BP Location: Right Arm)   Pulse 91   Temp 98.4 F (36.9 C) (Oral)   Resp 18   Ht 5' 2.5 (1.588 m)   Wt 270 lb 1 oz (122.5 kg)   LMP  (LMP Unknown)   SpO2 94%   BMI 48.61 kg/m   Visual Acuity Right Eye Distance:   Left Eye Distance:   Bilateral Distance:    Right Eye Near:   Left Eye Near:    Bilateral Near:     Physical Exam Vitals reviewed.  Constitutional:      General: She is awake.     Appearance: Normal appearance. She is well-developed and well-groomed.  HENT:     Head: Normocephalic and atraumatic.  Eyes:     General: Lids are normal. Gaze aligned appropriately.     Extraocular Movements: Extraocular movements intact.     Conjunctiva/sclera: Conjunctivae normal.  Pulmonary:     Effort: Pulmonary effort is normal.  Musculoskeletal:     Right ankle: No swelling, deformity or ecchymosis. Normal range of motion. Normal pulse.     Right Achilles Tendon: No tenderness.     Comments: Right foot with good range of motion, no bruising or swelling.  Dorsalis pedis and posterior tibialis pulses are 2+ and brisk.  She has intact range of motion at the right ankle with regards to flexion, extension, pronation, supination.  No obvious signs of bruising, redness, swelling, severe tenderness to palpation of the right foot or ankle.  Neurological:     Mental Status: She is alert and oriented to person, place, and time.  Psychiatric:        Attention and Perception:  Attention and perception normal.        Mood and Affect: Mood and affect normal.        Speech: Speech normal.        Behavior: Behavior normal. Behavior is cooperative.        Thought Content: Thought content normal.        Judgment: Judgment normal.      UC Treatments / Results  Labs (all labs ordered are listed, but only abnormal results are displayed) Labs Reviewed  URIC ACID    EKG   Radiology No results found.  Procedures Procedures (including critical care time)  Medications Ordered in UC Medications - No data to display  Initial Impression / Assessment and Plan / UC Course  I have reviewed the triage vital signs and the nursing notes.  Pertinent labs & imaging results that were available during my care of the patient were reviewed by me and considered in my medical decision making (see chart for details).      Final Clinical Impressions(s) / UC Diagnoses   Final diagnoses:  Acute gout of right ankle, unspecified cause   Gout flare, right ankle and foot Acute gout flare in the right ankle and foot with significant pain and nausea. Previous prednisone  treatment was effective but not recommended due to  potential interference with diabetes management. No bruising or swelling observed. - Ordered uric acid level test for evaluation per patient request - Recommended Tylenol  up to 3500 mg per day in divided doses - Provided dietary changes to help manage gout - Recommend follow up with PCP for ongoing concerns and evaluation with POdiatry for foot issues.    Type 2 diabetes mellitus Type 2 diabetes mellitus. Avoidance of further prednisone  due to potential impact on blood glucose levels.    Discharge Instructions      VISIT SUMMARY:  You presented with severe pain in your right foot and ankle, which you suspect is a gout flare-up. You also have ongoing issues with osteoarthritis in your right knee and are managing type 2 diabetes. We discussed your  symptoms, and I provided recommendations for managing your conditions.  YOUR PLAN:  -GOUT FLARE, RIGHT ANKLE AND FOOT: A gout flare is a sudden and severe onset of pain, redness, and swelling in a joint due to the accumulation of uric acid crystals. For your current flare, I have ordered a uric acid level test and recommended taking Tylenol  up to 3500 mg per day in divided doses. Additionally, I provided dietary changes to help manage your gout.  -TYPE 2 DIABETES MELLITUS: Type 2 diabetes is a chronic condition that affects the way your body processes blood sugar. We are avoiding prednisone  for your gout flare due to its potential impact on your blood glucose levels.  INSTRUCTIONS:  Follow up with the orthopedic specialist on December 3rd. Continue with your physical therapy exercises and adhere to the dietary changes provided to help manage your gout. Take Tylenol  up to 3500 mg per day in divided doses as needed for pain. We will review your uric acid levels once the test results are available.     ED Prescriptions   None    PDMP not reviewed this encounter.

## 2024-01-17 ENCOUNTER — Ambulatory Visit

## 2024-01-18 ENCOUNTER — Encounter: Payer: Self-pay | Admitting: Podiatry

## 2024-01-18 ENCOUNTER — Ambulatory Visit: Admitting: Podiatry

## 2024-01-18 DIAGNOSIS — F40240 Claustrophobia: Secondary | ICD-10-CM | POA: Insufficient documentation

## 2024-01-18 DIAGNOSIS — J309 Allergic rhinitis, unspecified: Secondary | ICD-10-CM | POA: Insufficient documentation

## 2024-01-18 DIAGNOSIS — E113212 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, left eye: Secondary | ICD-10-CM | POA: Insufficient documentation

## 2024-01-18 DIAGNOSIS — E113599 Type 2 diabetes mellitus with proliferative diabetic retinopathy without macular edema, unspecified eye: Secondary | ICD-10-CM | POA: Insufficient documentation

## 2024-01-18 DIAGNOSIS — F439 Reaction to severe stress, unspecified: Secondary | ICD-10-CM | POA: Insufficient documentation

## 2024-01-18 DIAGNOSIS — R9401 Abnormal electroencephalogram [EEG]: Secondary | ICD-10-CM | POA: Insufficient documentation

## 2024-01-18 DIAGNOSIS — R300 Dysuria: Secondary | ICD-10-CM | POA: Insufficient documentation

## 2024-01-18 DIAGNOSIS — E7800 Pure hypercholesterolemia, unspecified: Secondary | ICD-10-CM | POA: Insufficient documentation

## 2024-01-18 DIAGNOSIS — I679 Cerebrovascular disease, unspecified: Secondary | ICD-10-CM | POA: Insufficient documentation

## 2024-01-18 DIAGNOSIS — R195 Other fecal abnormalities: Secondary | ICD-10-CM | POA: Insufficient documentation

## 2024-01-18 DIAGNOSIS — F5101 Primary insomnia: Secondary | ICD-10-CM | POA: Insufficient documentation

## 2024-01-18 DIAGNOSIS — F4320 Adjustment disorder, unspecified: Secondary | ICD-10-CM | POA: Insufficient documentation

## 2024-01-18 DIAGNOSIS — I6990 Unspecified sequelae of unspecified cerebrovascular disease: Secondary | ICD-10-CM | POA: Insufficient documentation

## 2024-01-18 DIAGNOSIS — F419 Anxiety disorder, unspecified: Secondary | ICD-10-CM | POA: Insufficient documentation

## 2024-01-18 DIAGNOSIS — F331 Major depressive disorder, recurrent, moderate: Secondary | ICD-10-CM | POA: Insufficient documentation

## 2024-01-18 DIAGNOSIS — Z794 Long term (current) use of insulin: Secondary | ICD-10-CM | POA: Insufficient documentation

## 2024-01-18 DIAGNOSIS — M722 Plantar fascial fibromatosis: Secondary | ICD-10-CM

## 2024-01-18 DIAGNOSIS — E1169 Type 2 diabetes mellitus with other specified complication: Secondary | ICD-10-CM | POA: Insufficient documentation

## 2024-01-18 DIAGNOSIS — E113299 Type 2 diabetes mellitus with mild nonproliferative diabetic retinopathy without macular edema, unspecified eye: Secondary | ICD-10-CM | POA: Insufficient documentation

## 2024-01-18 DIAGNOSIS — J301 Allergic rhinitis due to pollen: Secondary | ICD-10-CM | POA: Insufficient documentation

## 2024-01-18 DIAGNOSIS — J4530 Mild persistent asthma, uncomplicated: Secondary | ICD-10-CM | POA: Insufficient documentation

## 2024-01-18 DIAGNOSIS — R1013 Epigastric pain: Secondary | ICD-10-CM | POA: Insufficient documentation

## 2024-01-18 DIAGNOSIS — D6859 Other primary thrombophilia: Secondary | ICD-10-CM | POA: Insufficient documentation

## 2024-01-18 DIAGNOSIS — E1142 Type 2 diabetes mellitus with diabetic polyneuropathy: Secondary | ICD-10-CM | POA: Insufficient documentation

## 2024-01-18 DIAGNOSIS — Z1211 Encounter for screening for malignant neoplasm of colon: Secondary | ICD-10-CM | POA: Insufficient documentation

## 2024-01-18 MED ORDER — TRIAMCINOLONE ACETONIDE 40 MG/ML IJ SUSP
20.0000 mg | Freq: Once | INTRAMUSCULAR | Status: AC
  Administered 2024-01-18: 20 mg

## 2024-01-18 NOTE — Patient Instructions (Signed)

## 2024-01-19 ENCOUNTER — Encounter: Payer: Self-pay | Admitting: Podiatry

## 2024-01-19 ENCOUNTER — Ambulatory Visit: Admitting: Podiatry

## 2024-01-19 DIAGNOSIS — E119 Type 2 diabetes mellitus without complications: Secondary | ICD-10-CM

## 2024-01-19 DIAGNOSIS — B351 Tinea unguium: Secondary | ICD-10-CM

## 2024-01-19 DIAGNOSIS — M79675 Pain in left toe(s): Secondary | ICD-10-CM | POA: Diagnosis not present

## 2024-01-19 DIAGNOSIS — M79674 Pain in right toe(s): Secondary | ICD-10-CM

## 2024-01-19 DIAGNOSIS — D6859 Other primary thrombophilia: Secondary | ICD-10-CM

## 2024-01-19 NOTE — Progress Notes (Signed)
 Subjective:  Patient ID: [name], female    DOB: 09/27/1955,  MRN: [id number] HPI Chief Complaint  Patient presents with   Foot Pain    Plantar heel right - aching x few weeks, woke up in pain and hasn't subsided, went to Urgent Care-xrayed, Rx'd prednisone , they think it was because of the heel spur, but patient thinks she had a gout flare   New Patient (Initial Visit)    68 y.o. female presents with the above complaint.   ROS: Denies fever chills nausea mobic muscle aches pains calf pain back pain chest pain shortness of breath.  Past Medical History:  Diagnosis Date   Arthritis    Asthma    CHF (congestive heart failure) (HCC)    Coronary artery disease    Diabetes mellitus    Diabetic neuropathy (HCC)    Dysrhythmia    A.fib    takes Eliquis    GERD (gastroesophageal reflux disease)    History of hiatal hernia    History of kidney stones    Hx of cardiovascular stress test    Lexiscan  Myoview (10/15):  Medium area of ischemia in AL and IL distribution, cannot completely exclude shifting breast attenuation, EF 64%; Abnormal study   Hyperlipidemia    Hypertension    Mild carotid artery disease    Morbid obesity (HCC)    PAF (paroxysmal atrial fibrillation) (HCC)    Sleep apnea    does not wear CPAP   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   Syncope    Past Surgical History:  Procedure Laterality Date   ACHILLES TENDON REPAIR  1995   right   BIOPSY  07/22/2021   Procedure: BIOPSY;  Surgeon: Saintclair Jasper, MD;  Location: [facility];  Service: Gastroenterology;;   CATARACT EXTRACTION Bilateral    COLONOSCOPY     COLONOSCOPY WITH PROPOFOL  N/A 07/22/2021   Procedure: COLONOSCOPY WITH PROPOFOL ;  Surgeon: Saintclair Jasper, MD;  Location: [facility];  Service: Gastroenterology;  Laterality: N/A;   CYSTOSCOPY W/ RETROGRADES Right 06/15/2023   Procedure: CYSTOSCOPY, WITH RETROGRADE PYELOGRAM;  Surgeon: Shane Steffan BROCKS, MD;  Location: [facility];  Service: Urology;   Laterality: Right;   CYSTOSCOPY/URETEROSCOPY/HOLMIUM LASER/STENT PLACEMENT Right 06/15/2023   Procedure: CYSTOSCOPY/URETEROSCOPY/HOLMIUM LASER/STENT PLACEMENT;  Surgeon: Shane Steffan BROCKS, MD;  Location: [facility];  Service: Urology;  Laterality: Right;   DILATION AND CURETTAGE OF UTERUS     ESOPHAGOGASTRODUODENOSCOPY (EGD) WITH PROPOFOL  N/A 07/22/2021   Procedure: ESOPHAGOGASTRODUODENOSCOPY (EGD) WITH PROPOFOL ;  Surgeon: Saintclair Jasper, MD;  Location: [facility];  Service: Gastroenterology;  Laterality: N/A;   EYE SURGERY Left    detached retina repair   HAND SURGERY Right    right  pinky finger   ORIF   heart stent  2007   PARS PLANA VITRECTOMY Left 03/24/2017   Procedure: PARS PLANA VITRECTOMY WITH 25 GAUGE;  Surgeon: Elner Arley LABOR, MD;  Location: [facility];  Service: Ophthalmology;  Laterality: Left;   POLYPECTOMY  07/22/2021   Procedure: POLYPECTOMY;  Surgeon: Saintclair Jasper, MD;  Location: [facility];  Service: Gastroenterology;;   UTERINE FIBROID SURGERY  2008    Current Outpatient Medications:    Accu-Chek Softclix Lancets lancets, 1 each 3 (three) times daily., Disp: , Rfl:    acetaminophen  (TYLENOL ) 500 MG tablet, Take 1,000 mg by mouth every 6 (six) hours as needed (pain.)., Disp: , Rfl:    albuterol  (VENTOLIN  HFA) 108 (90 Base) MCG/ACT inhaler, Inhale 1-2 puffs into the lungs every 6 (six) hours as needed for  wheezing or shortness of breath., Disp: 18 g, Rfl: 0   amLODipine  (NORVASC ) 10 MG tablet, TAKE 1 TABLET BY MOUTH ONCE DAILY. PLEASE KEEP UPCOMING APPOINTMENT IN FEBRUARY 2024 FOR FUTURE REFILLS. THANK YOU, Disp: 60 tablet, Rfl: 5   apixaban  (ELIQUIS ) 5 MG TABS tablet, Take 1 tablet by mouth twice daily, Disp: 180 tablet, Rfl: 1   aspirin  EC 81 MG tablet, Take 1 tablet (81 mg total) by mouth daily., Disp: 90 tablet, Rfl: 3   BREO ELLIPTA  100-25 MCG/INH AEPB, Inhale 1 puff into the lungs in the morning., Disp: , Rfl:    carvedilol  (COREG ) 6.25 MG tablet, Take 1 tablet by mouth twice  daily, Disp: 180 tablet, Rfl: 2   Cholecalciferol (VITAMIN D3) 25 MCG (1000 UT) CAPS, 1 capsule., Disp: , Rfl:    colestipol (COLESTID) 1 g tablet, Take 2 g by mouth daily in the afternoon., Disp: , Rfl:    Cyanocobalamin (VITAMIN B12) 1000 MCG TABS, 1 tablet Orally Once a day, Disp: , Rfl:    escitalopram (LEXAPRO) 5 MG tablet, Take 5 mg by mouth daily at 8 pm., Disp: , Rfl:    ezetimibe  (ZETIA ) 10 MG tablet, Take 1 tablet by mouth once daily, Disp: 90 tablet, Rfl: 2   fluticasone  (FLONASE ) 50 MCG/ACT nasal spray, Place 2 sprays into both nostrils daily., Disp: 9.9 mL, Rfl: 2   FREESTYLE LITE test strip, 1 each by Other route as directed. , Disp: , Rfl:    furosemide  (LASIX ) 40 MG tablet, Take 2 tablets (80 mg total) by mouth daily., Disp: 180 tablet, Rfl: 3   guaiFENesin  (MUCINEX ) 600 MG 12 hr tablet, Take 2 tablets (1,200 mg total) by mouth 2 (two) times daily., Disp: 30 tablet, Rfl: 0   HUMULIN R  U-500 KWIKPEN 500 UNIT/ML KwikPen, Inject 30-80 Units into the skin See admin instructions. INJECT 80 UNITS SUBCUTANEOUSLY BEFORE BREAKFAST AND 30 UNITS BEFORE EVENING MEAL TWICE DAILY 30 MINUTES BEFORE MEALS, Disp: , Rfl:    hyoscyamine  (ANASPAZ ) 0.125 MG TBDP disintergrating tablet, Place 1 tablet (0.125 mg total) under the tongue every 6 (six) hours as needed for up to 20 doses., Disp: 20 tablet, Rfl: 0   lisinopril  (ZESTRIL ) 40 MG tablet, Take 1 tablet by mouth once daily, Disp: 90 tablet, Rfl: 1   methocarbamol  (ROBAXIN ) 750 MG tablet, Take 750 mg by mouth 4 (four) times daily as needed (pain/spasms)., Disp: , Rfl:    Multiple Vitamin (MULTI VITAMIN) TABS, 1 tablet Orally Once a day, Disp: , Rfl:    nitroGLYCERIN  (NITROSTAT ) 0.4 MG SL tablet, Place 1 tablet (0.4 mg total) under the tongue every 5 (five) minutes as needed for chest pain (X 3 DOSES FOR CHEST PAIN)., Disp: 25 tablet, Rfl: 6   OZEMPIC, 2 MG/DOSE, 8 MG/3ML SOPN, Inject 2 mg into the skin every Sunday., Disp: , Rfl:    potassium chloride   (KLOR-CON ) 10 MEQ tablet, Take 1 tablet (10 mEq total) by mouth in the morning and at bedtime., Disp: 180 tablet, Rfl: 1   Povidone, PF, (IVIZIA DRY EYES) 0.5 % SOLN, Place 1 drop into both eyes daily., Disp: , Rfl:    rosuvastatin  (CRESTOR ) 40 MG tablet, Take 1 tablet by mouth once daily, Disp: 90 tablet, Rfl: 3   tamsulosin  (FLOMAX ) 0.4 MG CAPS capsule, Take 1 capsule (0.4 mg total) by mouth daily after supper., Disp: 30 capsule, Rfl: 0  Allergies  Allergen Reactions   Bystolic [Nebivolol Hcl] Palpitations   Crestor  [Rosuvastatin ] Other (See Comments)  Patient is currently taking and tolerating with out incident   Other Shortness Of Breath    Leretha anything that has this in it makes her wheeze   Sulfa Antibiotics Anaphylaxis   Bystolic [Nebivolol Hcl] Other (See Comments)    syncope   Invokana [Canagliflozin] Other (See Comments)    yeast infection   Januvia [Sitagliptin] Other (See Comments)    Unknown reaction.   Jardiance [Empagliflozin] Other (See Comments)    yeast infection   Keflex  [Cephalexin ] Other (See Comments)    stomach upset   Metformin And Related Diarrhea and Other (See Comments)    Upset stomach   Mobic [Meloxicam] Palpitations    Heart racing   Review of Systems Objective:  There were no vitals filed for this visit.  General: Well developed, nourished, in no acute distress, alert and oriented x3   Dermatological: Skin is warm, dry and supple bilateral. Nails x 10 are well maintained; remaining integument appears unremarkable at this time. There are no open sores, no preulcerative lesions, no rash or signs of infection present.  Vascular: Dorsalis Pedis artery and Posterior Tibial artery pedal pulses are 2/4 bilateral with immedate capillary fill time. Pedal hair growth present. No varicosities and no lower extremity edema present bilateral.   Neruologic: Grossly intact via light touch bilateral. Vibratory intact via tuning fork bilateral. Protective  threshold with Semmes Wienstein monofilament intact to all pedal sites bilateral. Patellar and Achilles deep tendon reflexes 2+ bilateral. No Babinski or clonus noted bilateral.  Pain on palpation medial calcaneal tubercle of her right heel.  She has no pain on medial-lateral compression of the calcaneus.  It appears that she has had surgery on the posterior aspect of the heel probably for Achilles.  Musculoskeletal: No gross boney pedal deformities bilateral. No pain, crepitus, or limitation noted with foot and ankle range of motion bilateral. Muscular strength 5/5 in all groups tested bilateral.  Gait: Unassisted, Nonantalgic.    Radiographs:  Radiographs previously taken reviewed today I demonstrate plantar distally oriented calcaneal heel spur with soft tissue increase in density at the plantar fascial canny insertion site posterior spurring associated with previous surgery it appears  Assessment & Plan:   Assessment: Plantar fasciitis right foot.  Plan: Discussed etiology pathology conservative versus surgical therapies.  She is just coming off of a steroid Dosepak which has elevated her sugar she says.  I will give her an injection to her right heel but no other medication.  10 mg with local anesthetic was utilized and I will follow-up with her in 6 weeks if necessary.  We discussed appropriate shoe gear and stretching exercises     Salahuddin Arismendez T. [place], [place]

## 2024-01-19 NOTE — Progress Notes (Signed)
 This patient returns to my office for at risk foot care.  This patient requires this care by a professional since this patient will be at risk due to having diabetes. This patient is unable to cut nails herself since the patient cannot reach her nails.These nails are painful walking and wearing shoes.  This patient presents for at risk foot care today.  General Appearance  Alert, conversant and in no acute stress.  Vascular  Dorsalis pedis and posterior tibial  pulses are palpable  bilaterally.  Capillary return is within normal limits  bilaterally. Temperature is within normal limits  bilaterally.  Neurologic  Senn-Weinstein monofilament wire test within normal limits  bilaterally. Muscle power within normal limits bilaterally.  Nails Thick disfigured discolored nails with subungual debris  from hallux to fifth toes bilaterally. No evidence of bacterial infection or drainage bilaterally.  Orthopedic  No limitations of motion  feet .  No crepitus or effusions noted.  No bony pathology or digital deformities noted.  Skin  normotropic skin with no porokeratosis noted bilaterally.  No signs of infections or ulcers noted.     Onychomycosis  Pain in right toes  Pain in left toes  Consent was obtained for treatment procedures.   Mechanical debridement of nails 1-5  bilaterally performed with a nail nipper.  Filed with dremel without incident.    Return office visit  3-4 months                   Told patient to return for periodic foot care and evaluation due to potential at risk complications.   Cordella Bold DPM

## 2024-01-20 ENCOUNTER — Ambulatory Visit: Payer: Medicare Other | Admitting: Neurology

## 2024-01-20 ENCOUNTER — Encounter: Payer: Self-pay | Admitting: Neurology

## 2024-01-20 VITALS — BP 143/81 | HR 80 | Ht 62.5 in | Wt 269.0 lb

## 2024-01-20 DIAGNOSIS — I6389 Other cerebral infarction: Secondary | ICD-10-CM

## 2024-01-20 DIAGNOSIS — I480 Paroxysmal atrial fibrillation: Secondary | ICD-10-CM

## 2024-01-20 NOTE — Progress Notes (Signed)
 [facility]  PATIENT: [name] DOB: 1955/12/06  REQUESTING CLINICIAN: Claudene Pellet, MD HISTORY FROM: Patient and niece  REASON FOR VISIT: Left occipital stroke follow up.    HISTORICAL  CHIEF COMPLAINT:  Chief Complaint  Patient presents with   RM 13    CVA; reports doing well; alone    INTERVAL HISTORY 01/20/2024 Patient presents today for follow-up, last visit was a year ago since then she has been doing well.  Denies any strokelike symptoms.  She does complain of knee pain and foot pain for which she is getting injections.  She has not follow-up with her sleep doctor since last visit.   INTERVAL HISTORY 01/14/2023:  Patient presents today for follow-up, she is accompanied by her friend Zebedee.  Last visit was a year ago, since then she has been doing well, denies any strokelike symptoms.  She reports experiencing some trouble with balance, has completed physical therapy and now she is doing exercise at the [facility]. She was diagnosed with carpal tunnel syndrome, was recommended to wrist brace but she does not use it consistently. She also reports trouble with sleep, she has difficulty sleeping due to watching TV in her bedroom, usually wakes up in the afternoon.  She does have a sleep apnea but does not use her CPAP machine.   INTERVAL HISTORY 01/22/2022:  Patient presents today for follow-up, she is accompanied by her niece.  Last visit was a year ago.  At that time we had continue all the medications.  She is on Eliquis  for atrial fibrillation and still on aspirin  for his cardiac stents.  Denies any current deficits.  She is ambulatory but state that she is not as active as she would want.  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  Enjoys her work.  Reported she is somehow forgetful but other than that she is doing well.  Again no deficit from a stroke. She denies any new seizure or seizure-like activity.  Her previous EEG showed  left posterior quadrant slowing which is consistent with the area where she had her stroke.   HISTORY OF PRESENT ILLNESS:  This is a 68 year old woman with past medical history of atrial fibrillation, hypertension, hyperlipidemia, obesity, diabetes mellitus type 2, anxiety and asthma who is presenting after recent admission for facial droop and syncope and found to have a 3mm left occipital stroke.  Patient was at her psychiatrist office when she started complaining of tingling of left side of face and around her left eye who directed her to the ED.  In the ED she was noted to be hypertensive in the 190 systolic and reported had a syncopal episode with urinary incontinence when the phlebotomist entered the room.  She was found to be in A. Fib with RVR and had a MRI brain which showed a 3 mm acute left occipital stroke.  She was noted to have no neurological deficit from the left acute stroke.  She had a carotid ultrasound which did not show any high-grade stenosis, her echocardiogram showed normal ventricular function no thrombus noted.  She was started on aspirin  and Eliquis .  Her A1c was 9 and she was started on insulin .  Patient report prior to going to the hospital she stopped taking her aspirin  and Plavix  (she had cardiac stents) but on discharge was also was only put back on aspirin  no Plavix .  She is supposed to follow-up with a cardiologist in 2 days.  Since discharge, she has been complaining of blurred vision, nausea but no vomiting. She also reports increase anxiety.    OTHER MEDICAL CONDITIONS: Atrial fibrillation, HTN, HLD, Obesity, Anxiety DMII, and Asthma    REVIEW OF SYSTEMS: Full 14 system review of systems performed and negative with exception of: as noted in the HPI   ALLERGIES: Allergies  Allergen Reactions   Bystolic [Nebivolol Hcl] Palpitations   Crestor  [Rosuvastatin ] Other (See Comments)    Patient is currently taking and tolerating with out incident   Other Shortness Of  Breath    Leretha anything that has this in it makes her wheeze   Sulfa Antibiotics Anaphylaxis   Bystolic [Nebivolol Hcl] Other (See Comments)    syncope   Invokana [Canagliflozin] Other (See Comments)    yeast infection   Januvia [Sitagliptin] Other (See Comments)    Unknown reaction.   Jardiance [Empagliflozin] Other (See Comments)    yeast infection   Keflex  [Cephalexin ] Other (See Comments)    stomach upset   Metformin And Related Diarrhea and Other (See Comments)    Upset stomach   Mobic [Meloxicam] Palpitations    Heart racing    HOME MEDICATIONS: Outpatient Medications Prior to Visit  Medication Sig Dispense Refill   Accu-Chek Softclix Lancets lancets 1 each 3 (three) times daily.     acetaminophen  (TYLENOL ) 500 MG tablet Take 1,000 mg by mouth every 6 (six) hours as needed (pain.).     albuterol  (VENTOLIN  HFA) 108 (90 Base) MCG/ACT inhaler Inhale 1-2 puffs into the lungs every 6 (six) hours as needed for wheezing or shortness of breath. 18 g 0   amLODipine  (NORVASC ) 10 MG tablet TAKE 1 TABLET BY MOUTH ONCE DAILY. PLEASE KEEP UPCOMING APPOINTMENT IN FEBRUARY 2024 FOR FUTURE REFILLS. THANK YOU 60 tablet 5   apixaban  (ELIQUIS ) 5 MG TABS tablet Take 1 tablet by mouth twice daily 180 tablet 1   aspirin  EC 81 MG tablet Take 1 tablet (81 mg total) by mouth daily. 90 tablet 3   BREO ELLIPTA  100-25 MCG/INH AEPB Inhale 1 puff into the lungs in the morning.     carvedilol  (COREG ) 6.25 MG tablet Take 1 tablet by mouth twice daily 180 tablet 2   Cholecalciferol (VITAMIN D3) 25 MCG (1000 UT) CAPS 1 capsule.     colestipol (COLESTID) 1 g tablet Take 2 g by mouth daily in the afternoon.     Cyanocobalamin (VITAMIN B12) 1000 MCG TABS 1 tablet Orally Once a day     escitalopram (LEXAPRO) 5 MG tablet Take 5 mg by mouth daily at 8 pm.     ezetimibe  (ZETIA ) 10 MG tablet Take 1 tablet by mouth once daily 90 tablet 2   fluticasone  (FLONASE ) 50 MCG/ACT nasal spray Place 2 sprays into both nostrils  daily. 9.9 mL 2   FREESTYLE LITE test strip 1 each by Other route as directed.      furosemide  (LASIX ) 40 MG tablet Take 2 tablets (80 mg total) by mouth daily. 180 tablet 3   guaiFENesin  (MUCINEX ) 600 MG 12 hr tablet Take 2 tablets (1,200 mg total) by mouth 2 (two) times daily. 30 tablet 0   HUMULIN R  U-500 KWIKPEN 500 UNIT/ML KwikPen Inject 30-80 Units into the skin See admin instructions. INJECT 80 UNITS SUBCUTANEOUSLY BEFORE BREAKFAST AND 30 UNITS BEFORE EVENING MEAL TWICE DAILY 30 MINUTES BEFORE MEALS     hyoscyamine  (ANASPAZ ) 0.125 MG TBDP disintergrating tablet Place 1 tablet (0.125 mg total) under the tongue every 6 (six)  hours as needed for up to 20 doses. 20 tablet 0   lisinopril  (ZESTRIL ) 40 MG tablet Take 1 tablet by mouth once daily 90 tablet 1   methocarbamol  (ROBAXIN ) 750 MG tablet Take 750 mg by mouth 4 (four) times daily as needed (pain/spasms).     Multiple Vitamin (MULTI VITAMIN) TABS 1 tablet Orally Once a day     nitroGLYCERIN  (NITROSTAT ) 0.4 MG SL tablet Place 1 tablet (0.4 mg total) under the tongue every 5 (five) minutes as needed for chest pain (X 3 DOSES FOR CHEST PAIN). 25 tablet 6   OZEMPIC, 2 MG/DOSE, 8 MG/3ML SOPN Inject 2 mg into the skin every Sunday.     potassium chloride  (KLOR-CON ) 10 MEQ tablet Take 1 tablet (10 mEq total) by mouth in the morning and at bedtime. 180 tablet 1   Povidone, PF, (IVIZIA DRY EYES) 0.5 % SOLN Place 1 drop into both eyes daily.     rosuvastatin  (CRESTOR ) 40 MG tablet Take 1 tablet by mouth once daily 90 tablet 3   tamsulosin  (FLOMAX ) 0.4 MG CAPS capsule Take 1 capsule (0.4 mg total) by mouth daily after supper. 30 capsule 0   No facility-administered medications prior to visit.    PAST MEDICAL HISTORY: Past Medical History:  Diagnosis Date   Arthritis    Asthma    CHF (congestive heart failure) (HCC)    Coronary artery disease    Diabetes mellitus    Diabetic neuropathy (HCC)    Dysrhythmia    A.fib    takes Eliquis    GERD  (gastroesophageal reflux disease)    History of hiatal hernia    History of kidney stones    Hx of cardiovascular stress test    Lexiscan  Myoview (10/15):  Medium area of ischemia in AL and IL distribution, cannot completely exclude shifting breast attenuation, EF 64%; Abnormal study   Hyperlipidemia    Hypertension    Mild carotid artery disease    Morbid obesity (HCC)    PAF (paroxysmal atrial fibrillation) (HCC)    Sleep apnea    does not wear CPAP   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   Syncope     PAST SURGICAL HISTORY: Past Surgical History:  Procedure Laterality Date   ACHILLES TENDON REPAIR  1995   right   BIOPSY  07/22/2021   Procedure: BIOPSY;  Surgeon: Saintclair Jasper, MD;  Location: [facility];  Service: Gastroenterology;;   CATARACT EXTRACTION Bilateral    COLONOSCOPY     COLONOSCOPY WITH PROPOFOL  N/A 07/22/2021   Procedure: COLONOSCOPY WITH PROPOFOL ;  Surgeon: Saintclair Jasper, MD;  Location: [facility];  Service: Gastroenterology;  Laterality: N/A;   CYSTOSCOPY W/ RETROGRADES Right 06/15/2023   Procedure: CYSTOSCOPY, WITH RETROGRADE PYELOGRAM;  Surgeon: Shane Steffan BROCKS, MD;  Location: [facility];  Service: Urology;  Laterality: Right;   CYSTOSCOPY/URETEROSCOPY/HOLMIUM LASER/STENT PLACEMENT Right 06/15/2023   Procedure: CYSTOSCOPY/URETEROSCOPY/HOLMIUM LASER/STENT PLACEMENT;  Surgeon: Shane Steffan BROCKS, MD;  Location: [facility];  Service: Urology;  Laterality: Right;   DILATION AND CURETTAGE OF UTERUS     ESOPHAGOGASTRODUODENOSCOPY (EGD) WITH PROPOFOL  N/A 07/22/2021   Procedure: ESOPHAGOGASTRODUODENOSCOPY (EGD) WITH PROPOFOL ;  Surgeon: Saintclair Jasper, MD;  Location: [facility];  Service: Gastroenterology;  Laterality: N/A;   EYE SURGERY Left    detached retina repair   HAND SURGERY Right    right  pinky finger   ORIF   heart stent  2007   PARS PLANA VITRECTOMY Left 03/24/2017   Procedure: PARS PLANA VITRECTOMY WITH 25 GAUGE;  Surgeon: Elner Arley LABOR, MD;  Location: [facility];   Service: Ophthalmology;  Laterality: Left;   POLYPECTOMY  07/22/2021   Procedure: POLYPECTOMY;  Surgeon: Saintclair Jasper, MD;  Location: [facility];  Service: Gastroenterology;;   UTERINE FIBROID SURGERY  2008    FAMILY HISTORY: Family History  Problem Relation Age of Onset   Heart disease Mother    Kidney disease Father    Stroke Father    Heart attack Father    Heart disease Brother    Heart disease Brother    Stroke Maternal Grandfather    Stroke Paternal Grandfather    Asthma Other    Hypertension Other    Diabetes Other     SOCIAL HISTORY: Social History   Socioeconomic History   Marital status: Single    Spouse name: Not on file   Number of children: 0   Years of education: Not on file   Highest education level: Not on file  Occupational History   Not on file  Tobacco Use   Smoking status: Former    Current packs/day: 0.00    Types: Cigarettes    Quit date: 08/07/1982    Years since quitting: 41.4   Smokeless tobacco: Never  Vaping Use   Vaping status: Never Used  Substance and Sexual Activity   Alcohol use: No   Drug use: No   Sexual activity: Not Currently  Other Topics Concern   Not on file  Social History Narrative   Not on file   Social Drivers of Health   Tobacco Use: Medium Risk (01/20/2024)   Patient History    Smoking Tobacco Use: Former    Smokeless Tobacco Use: Never    Passive Exposure: Not on Actuary Strain: Not on file  Food Insecurity: Not on file  Transportation Needs: Not on file  Physical Activity: Not on file  Stress: Not on file  Social Connections: Not on file  Intimate Partner Violence: Not At Risk (08/01/2022)   Received from [facility]    Abuse Screen    Feels Unsafe at Home or Work/School: no    Feels Threatened by Someone: no    Does Anyone Try to Keep You From Having Contact with Others or Doing Things Outside Your Home?: no    Physical Signs of Abuse Present: no  Depression  (PHQ2-9): Not on file  Alcohol Screen: Not on file  Housing: Not on file  Utilities: Not on file  Health Literacy: Not on file     PHYSICAL EXAM  GENERAL EXAM/CONSTITUTIONAL: Vitals:  Vitals:   01/20/24 0954  BP: (!) 143/81  Pulse: 80  Weight: 269 lb (122 kg)  Height: 5' 2.5 (1.588 m)    Body mass index is 48.42 kg/m. Wt Readings from Last 3 Encounters:  01/20/24 269 lb (122 kg)  01/07/24 270 lb 1 oz (122.5 kg)  12/31/23 270 lb (122.5 kg)   Patient is in no distress; well developed, nourished and groomed; neck is supple, obese woman   MUSCULOSKELETAL: Gait, strength, tone, movements noted in Neurologic exam below  NEUROLOGIC: MENTAL STATUS:      No data to display         awake, alert, oriented to person, place and time recent and remote memory intact normal attention and concentration language fluent, comprehension intact, naming intact fund of knowledge appropriate  CRANIAL NERVE:  2nd, 3rd, 4th, 6th - visual fields full to confrontation, extraocular muscles intact, no nystagmus 5th - facial  sensation symmetric 7th - facial strength symmetric 8th - hearing intact 9th - palate elevates symmetrically, uvula midline 11th - shoulder shrug symmetric 12th - tongue protrusion midline  MOTOR:  normal bulk and tone, full strength in the BUE, BLE  SENSORY:  normal and symmetric to light touch  COORDINATION:  finger-nose-finger, fine finger movements normal  GAIT/STATION:  normal   DIAGNOSTIC DATA (LABS, IMAGING, TESTING) - I reviewed patient records, labs, notes, testing and imaging myself where available.  Lab Results  Component Value Date   WBC 6.8 06/08/2023   HGB 11.5 (L) 06/08/2023   HCT 35.1 (L) 06/08/2023   MCV 75.3 (L) 06/08/2023   PLT 289 06/08/2023      Component Value Date/Time   NA 136 06/08/2023 1350   NA 138 03/11/2020 0737   K 3.7 06/08/2023 1350   CL 101 06/08/2023 1350   CO2 28 06/08/2023 1350   GLUCOSE 196 (H) 06/08/2023  1350   BUN 16 06/08/2023 1350   BUN 12 03/11/2020 0737   CREATININE 1.08 (H) 06/08/2023 1350   CREATININE 0.69 01/05/2012 1629   CALCIUM  9.1 06/08/2023 1350   PROT 7.6 06/13/2022 1525   PROT 7.2 02/26/2021 0812   ALBUMIN 3.7 06/13/2022 1525   ALBUMIN 4.3 02/26/2021 0812   AST 24 06/13/2022 1525   ALT 22 06/13/2022 1525   ALKPHOS 83 06/13/2022 1525   BILITOT 1.1 06/13/2022 1525   BILITOT 0.5 02/26/2021 0812   GFRNONAA 56 (L) 06/08/2023 1350   GFRAA 105 03/11/2020 0737   Lab Results  Component Value Date   CHOL 114 02/26/2021   HDL 37 (L) 02/26/2021   LDLCALC 63 02/26/2021   TRIG 66 02/26/2021   CHOLHDL 3.1 02/26/2021   Lab Results  Component Value Date   HGBA1C 8.4 (H) 06/08/2023   No results found for: VITAMINB12 Lab Results  Component Value Date   TSH 3.797 11/27/2020    MRI Brain 11/28/2020 3 mm acute infarct left occipital lobe Moderate chronic ischemic changes.  MRI Brain 12/20/2020 No acute intracranial process. No evidence of acute infarction or hemorrhage.  Bilateral carotid US  10/22 Mild bilateral carotid atherosclerosis. Negative for stenosis.nDegree of narrowing less than 50% bilaterally by ultrasound criteria. Patent antegrade vertebral flow bilaterally  Echo 11/28/20 1. Left ventricular ejection fraction, by estimation, is 60 to 65%. The left ventricle has normal function. The left ventricle has no regional wall motion abnormalities. There is moderate concentric left ventricular hypertrophy. Left ventricular diastolic parameters are consistent with Grade I diastolic dysfunction (impaired relaxation). Elevated left ventricular end-diastolic pressure.  2. RV-RA gradient normal at 27 mmHg suggesting normal RVSP. Right ventricular systolic function is normal. The right ventricular size is normal.  3. The mitral valve is grossly normal. Trivial mitral valve regurgitation.  4. The aortic valve is tricuspid. Aortic valve regurgitation is not visualized.  Aortic valve mean gradient measures 5.0 mmHg.  5. Unable to estimate CVP.   Routine EEG 12/31/2020 Left posterior quadrant slowing   ASSESSMENT AND PLAN  68 y.o. year old female with vascular risk factor including diabetes mellitus, hypertension, hyperlipidemia, coronary artery disease and obesity,  left occipital stroke here for follow up.  Overall she is doing well, compliant with her medications, plan for patient is to continue current medications, she will contact us  if she does want to move forward with her sleep evaluation otherwise I will see her as needed basis.  She voiced understanding.   1. Cerebrovascular accident (CVA) due to other mechanism (HCC)  2. Paroxysmal atrial fibrillation with RVR [facility])      Patient Instructions  Continue current medications Continue follow-up with doctors Please contact us  if you do want to move forward on the sleep evaluation Follow-up as needed   No orders of the defined types were placed in this encounter.   No orders of the defined types were placed in this encounter.   Return if symptoms worsen or fail to improve.   Pastor Falling, MD 01/20/2024, 11:47 AM  [facility] [address] [place], [place] [address] [phone]

## 2024-01-20 NOTE — Patient Instructions (Signed)
 Continue current medications Continue follow-up with doctors Please contact us  if you do want to move forward on the sleep evaluation Follow-up as needed

## 2024-02-04 ENCOUNTER — Telehealth: Payer: Self-pay | Admitting: Pharmacist

## 2024-02-04 DIAGNOSIS — I10 Essential (primary) hypertension: Secondary | ICD-10-CM

## 2024-02-04 MED ORDER — LISINOPRIL 40 MG PO TABS: 40.0000 mg | ORAL_TABLET | Freq: Every day | ORAL | 1 refills | Status: DC

## 2024-02-04 NOTE — Progress Notes (Signed)
" ° °  02/04/2024  Patient ID: [name], female   DOB: 06-Apr-1955, 68 y.o.   MRN: [id number]  Patient appeared on med adherence list for 2025:  Lisinopril - need 273 covered/ 270 currently + 5DS  Rosuvastatin = pass     Needs a refill from Cardiology to continue the lisinopril . Sending a refill request today.    Aloysius Lewis, PharmD, [facility] Phone Number: [phone]  "

## 2024-02-04 NOTE — Telephone Encounter (Signed)
 Refill sent to pharmacy.

## 2024-02-11 ENCOUNTER — Telehealth: Payer: Self-pay | Admitting: Pharmacist

## 2024-02-11 ENCOUNTER — Other Ambulatory Visit: Payer: Self-pay | Admitting: Interventional Cardiology

## 2024-02-11 ENCOUNTER — Telehealth: Payer: Self-pay | Admitting: Cardiology

## 2024-02-11 DIAGNOSIS — I10 Essential (primary) hypertension: Secondary | ICD-10-CM

## 2024-02-11 NOTE — Telephone Encounter (Signed)
" °*  STAT* If patient is at the pharmacy, call can be transferred to refill team.   1. Which medications need to be refilled? (please list name of each medication and dose if known)   carvedilol  (COREG ) 6.25 MG tablet  ezetimibe  (ZETIA ) 10 MG tablet  lisinopril  (ZESTRIL ) 40 MG tablet  furosemide  (LASIX ) 40 MG tablet   2. Would you like to learn more about the convenience, safety, & potential cost savings by using the [facility]?   3. Are you open to using the [facility] (Type [facility]. ).  4. Which pharmacy/location (including street and city if local pharmacy) is medication to be sent to?  [id number] - [place], [place] - [address]   5. Do they need a 30 day or 90 day supply?   90 day   Patient stated she is almost out of these medication.     "

## 2024-02-11 NOTE — Progress Notes (Signed)
" ° °  02/11/2024  Patient ID: [name], female   DOB: 1956-01-26, 69 y.o.   MRN: [id number]  Patient appeared on med adherence report for 2025:  Lisinopril - need 273 covered/ 270 currently + 5DS; Refill request sent to Cardio on 12/26  Rosuvastatin = pass -------------------------------------------  Called and spoke with the patient on the phone regarding picking up Lisinopril  prior to Monday, as it will get returned to stock. Confirmed understanding.  Patient reports feeling anxious and off lately. Started about a month ago and hasn't resolved. Also been having issues sleeping. Believes she needs to increase the Escitalopram dose or change it.  When asked about timing of the medication, she has been taking it at 10PM. Couldn't fall asleep until 3AM but was able to go back to sleep after waking up early until 12:30PM today. Has followed psych recommendations to remove TV and electronics prior to bedtime but hasn't helped.   Message sent to Dr. Claudene regarding concern.    Aloysius Lewis, PharmD, [facility] Phone Number: [phone]  "

## 2024-02-14 ENCOUNTER — Ambulatory Visit: Attending: Physician Assistant | Admitting: Physical Therapy

## 2024-02-14 ENCOUNTER — Other Ambulatory Visit: Payer: Self-pay

## 2024-02-14 ENCOUNTER — Encounter: Payer: Self-pay | Admitting: Physical Therapy

## 2024-02-14 DIAGNOSIS — M25552 Pain in left hip: Secondary | ICD-10-CM | POA: Diagnosis present

## 2024-02-14 DIAGNOSIS — M6281 Muscle weakness (generalized): Secondary | ICD-10-CM | POA: Insufficient documentation

## 2024-02-14 DIAGNOSIS — M25562 Pain in left knee: Secondary | ICD-10-CM | POA: Diagnosis present

## 2024-02-14 DIAGNOSIS — G8929 Other chronic pain: Secondary | ICD-10-CM | POA: Insufficient documentation

## 2024-02-14 DIAGNOSIS — M25551 Pain in right hip: Secondary | ICD-10-CM | POA: Diagnosis present

## 2024-02-14 DIAGNOSIS — M5459 Other low back pain: Secondary | ICD-10-CM | POA: Diagnosis present

## 2024-02-14 DIAGNOSIS — M25561 Pain in right knee: Secondary | ICD-10-CM | POA: Insufficient documentation

## 2024-02-14 NOTE — Therapy (Signed)
 " OUTPATIENT PHYSICAL THERAPY LOWER EXTREMITY EVALUATION   Patient Name: ISYS TIETJE MRN: [id number] DOB:02-21-1955, 69 y.o., female Today's Date: 02/14/2024  END OF SESSION:  PT End of Session - 02/14/24 1140     Visit Number 1    Number of Visits 13    Date for Recertification  03/27/24    Authorization Type [employer] MCR    Authorization Time Period TBD    Progress Note Due on Visit 10    PT Start Time 1105    PT Stop Time 1143    PT Time Calculation (min) 38 min    Activity Tolerance Patient tolerated treatment well    Behavior During Therapy WFL for tasks assessed/performed          Past Medical History:  Diagnosis Date   Arthritis    Asthma    CHF (congestive heart failure) (HCC)    Coronary artery disease    Diabetes mellitus    Diabetic neuropathy (HCC)    Dysrhythmia    A.fib    takes Eliquis    GERD (gastroesophageal reflux disease)    History of hiatal hernia    History of kidney stones    Hx of cardiovascular stress test    Lexiscan  Myoview (10/15):  Medium area of ischemia in AL and IL distribution, cannot completely exclude shifting breast attenuation, EF 64%; Abnormal study   Hyperlipidemia    Hypertension    Mild carotid artery disease    Morbid obesity (HCC)    PAF (paroxysmal atrial fibrillation) (HCC)    Sleep apnea    does not wear CPAP   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   Syncope    Past Surgical History:  Procedure Laterality Date   ACHILLES TENDON REPAIR  1995   right   BIOPSY  07/22/2021   Procedure: BIOPSY;  Surgeon: Saintclair Jasper, MD;  Location: [facility];  Service: Gastroenterology;;   CATARACT EXTRACTION Bilateral    COLONOSCOPY     COLONOSCOPY WITH PROPOFOL  N/A 07/22/2021   Procedure: COLONOSCOPY WITH PROPOFOL ;  Surgeon: Saintclair Jasper, MD;  Location: [facility];  Service: Gastroenterology;  Laterality: N/A;   CYSTOSCOPY W/ RETROGRADES Right 06/15/2023   Procedure: CYSTOSCOPY, WITH RETROGRADE PYELOGRAM;  Surgeon: Shane Steffan BROCKS, MD;  Location: [facility];  Service: Urology;  Laterality: Right;   CYSTOSCOPY/URETEROSCOPY/HOLMIUM LASER/STENT PLACEMENT Right 06/15/2023   Procedure: CYSTOSCOPY/URETEROSCOPY/HOLMIUM LASER/STENT PLACEMENT;  Surgeon: Shane Steffan BROCKS, MD;  Location: [facility];  Service: Urology;  Laterality: Right;   DILATION AND CURETTAGE OF UTERUS     ESOPHAGOGASTRODUODENOSCOPY (EGD) WITH PROPOFOL  N/A 07/22/2021   Procedure: ESOPHAGOGASTRODUODENOSCOPY (EGD) WITH PROPOFOL ;  Surgeon: Saintclair Jasper, MD;  Location: [facility];  Service: Gastroenterology;  Laterality: N/A;   EYE SURGERY Left    detached retina repair   HAND SURGERY Right    right  pinky finger   ORIF   heart stent  2007   PARS PLANA VITRECTOMY Left 03/24/2017   Procedure: PARS PLANA VITRECTOMY WITH 25 GAUGE;  Surgeon: Elner Arley LABOR, MD;  Location: [facility];  Service: Ophthalmology;  Laterality: Left;   POLYPECTOMY  07/22/2021   Procedure: POLYPECTOMY;  Surgeon: Saintclair Jasper, MD;  Location: [facility];  Service: Gastroenterology;;   UTERINE FIBROID SURGERY  2008   Patient Active Problem List   Diagnosis Date Noted   Abnormal EEG 01/18/2024   Adjustment disorder 01/18/2024   Allergic rhinitis 01/18/2024   Allergic rhinitis due to pollen 01/18/2024   Anxiety 01/18/2024   Cerebrovascular disease 01/18/2024  Claustrophobia 01/18/2024   Colon cancer screening 01/18/2024   Dysuria 01/18/2024   Epigastric pain 01/18/2024   Late effects of cerebrovascular disease 01/18/2024   Long term current use of insulin  (HCC) 01/18/2024   Loose stools 01/18/2024   Major depressive disorder, recurrent, moderate (HCC) 01/18/2024   Mild nonproliferative diabetic retinopathy associated with type 2 diabetes mellitus (HCC) 01/18/2024   Mild persistent asthma without complication 01/18/2024   Polyneuropathy due to type 2 diabetes mellitus (HCC) 01/18/2024   Primary insomnia 01/18/2024   Proliferative diabetic retinopathy associated with type 2 diabetes mellitus  (HCC) 01/18/2024   Pure hypercholesterolemia 01/18/2024   Situational stress 01/18/2024   Thrombophilia 01/18/2024   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, left eye (HCC) 01/18/2024   Type 2 diabetes mellitus with other specified complication (HCC) 01/18/2024   Trigger finger, left ring finger 08/10/2023   Trigger thumb of right hand 08/10/2023   Arthralgia of left knee 12/24/2022   Pain of lumbar spine 12/24/2022   Diabetic peripheral neuropathy (HCC) 09/11/2022   Disorder of both ulnar nerves 09/11/2022   Acquired trigger finger of left ring finger 08/31/2022   Bilateral carpal tunnel syndrome 08/31/2022   Pain in both hands 08/31/2022   Paroxysmal atrial fibrillation with RVR (HCC) 11/28/2020   Hypertensive urgency 11/28/2020   Syncope and collapse 11/28/2020   Nausea 11/28/2020   Hypokalemia 11/28/2020   Hyperglycemia due to diabetes mellitus (HCC) 11/28/2020   GERD (gastroesophageal reflux disease) 11/28/2020   Asthma 11/28/2020   Coronary atherosclerosis of native coronary artery 07/17/2013   Mixed hyperlipidemia 07/17/2013   Essential hypertension, benign 07/17/2013   Obesity 08/11/2011    PCP: Claudene Pellet MD   REFERRING PROVIDER: Melvenia Debby Cain, PA-C  REFERRING DIAG: [phone] (ICD-10-CM) - Pain in left knee  THERAPY DIAG:  Chronic pain of left knee  Bilateral hip pain  Muscle weakness (generalized)  Rationale for Evaluation and Treatment: Rehabilitation  ONSET DATE: chronic   SUBJECTIVE:   SUBJECTIVE STATEMENT:  Left knee and B hips are a problem right now. I try to flip from side to side when sleeping but they stay sore. R foot is doing better after getting a shot in it. They put gel in the left knee but its still doing its thing, I would really like to avoid surgery. Knee has been an issue for a long time. Someone at the office that did my gel shot told me to use my cane on the other side.    PERTINENT HISTORY: See above  PAIN:  Are you having pain? Yes: NPRS scale: 5/10 now, at worst 8/10 Pain location: L knee  Pain description: dull and just there Aggravating factors: being up on knee too much, lifting too much  Relieving factors: exercises- doing them every day recently    PRECAUTIONS: None  RED FLAGS: None   WEIGHT BEARING RESTRICTIONS: No  FALLS:  Has patient fallen in last 6 months? Yes. 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    PATIENT SURVEYS:  LEFS  Extreme difficulty/unable (0), Quite a bit of difficulty (1), Moderate difficulty (2), Little difficulty (3), No difficulty (4) Survey date:  02/14/24 eval   Any of your usual work, housework or school activities   2. Usual hobbies, recreational or sporting activities   3. Getting into/out of the bath   4. Walking between rooms   5. Putting on socks/shoes   6. Squatting    7. Lifting an object, like a bag of groceries from the floor   8. Performing light activities around your home   9. Performing heavy activities around your home   10. Getting into/out of a car   11. Walking 2 blocks   12. Walking 1 mile   13. Going up/down 10 stairs (1 flight)   14. Standing for 1 hour   15.  sitting for 1 hour   16. Running on even ground   17. Running on uneven ground   18. Making sharp turns while running fast   19. Hopping    20. Rolling over in bed    Score total:  31/80     COGNITION: Overall cognitive status: Within functional limits for tasks assessed     SENSATION: Not tested    LOWER EXTREMITY ROM:  Active ROM Right eval Left eval  Hip flexion    Hip extension    Hip abduction    Hip adduction    Hip internal rotation    Hip external rotation    Knee flexion  ~90*, stiff   Knee extension  WNL   Ankle dorsiflexion    Ankle plantarflexion    Ankle inversion    Ankle eversion     (Blank rows = not tested)  LOWER EXTREMITY MMT:  MMT Right eval Left eval  Hip flexion 4 4  Hip extension    Hip abduction Seated 3 at best Seated 3 at best   Hip adduction    Hip internal rotation    Hip external rotation    Knee flexion 5 5  Knee extension 5 5 pain  Ankle dorsiflexion    Ankle plantarflexion    Ankle inversion    Ankle eversion     (Blank rows = not tested)                                                                                                                                  TREATMENT DATE:   02/14/24  Eval, POC, HEP  Nustep L2x6 minutes seat 8 all four extremities   Education as below     PATIENT EDUCATION:  Education details: exam findings, POC, HEP, trial of PT but if pain does not improve along with objective measures would benefit from return to MD for next steps in care, correct cane use  Person educated: Patient Education method: Explanation, Demonstration, and Handouts Education  comprehension: verbalized understanding, returned demonstration, and needs further education  HOME EXERCISE PROGRAM: TBD   ASSESSMENT:  CLINICAL IMPRESSION:  Patient is a 69 y.o. F very well known to this clinic, who was seen today for physical therapy evaluation and treatment for L knee pain. She reports some B hip pain preventing her from being able to sleep well especially in sidelying which we can work to address here as well. Had a gel shot in her L knee with limited relief. Will make every  effort to address pain and objective limitations moving forward, may need to return to MD for next steps in care if pain does not improve in this round of skilled PT care.    OBJECTIVE IMPAIRMENTS: Abnormal gait, decreased activity tolerance, decreased mobility, difficulty walking, decreased ROM, decreased strength, obesity, and pain.   ACTIVITY LIMITATIONS: standing, squatting, sleeping, stairs, transfers, and locomotion level  PARTICIPATION LIMITATIONS: meal prep, cleaning, laundry, driving, shopping, community activity, and yard work  PERSONAL FACTORS: Age, Education, Fitness, Past/current experiences, Social background, and Time since onset of injury/illness/exacerbation are also affecting patient's functional outcome.   REHAB POTENTIAL: Fair limited progress in terms of pain control with PT in the past   CLINICAL DECISION MAKING: Stable/uncomplicated  EVALUATION COMPLEXITY: Low   GOALS: Goals reviewed with patient? No  SHORT TERM GOALS: Target date: 03/06/2024   Will be compliant with appropriate progressive HEP  Baseline: Goal status: INITIAL  2.  Sx to have improved by 25%  Baseline:  Goal status: INITIAL    LONG TERM GOALS: Target date: 03/27/2024    MMT to have improved by at least one grade in all weak groups  Baseline:  Goal status: INITIAL  2.  Will be able to sleep on her side as desired without increase in pain  Baseline:  Goal status: INITIAL  3.  Will be able to perform all care duties for her sister without difficulty or increased pain  Baseline:  Goal status: INITIAL  4.  LEFS to improve by 10 points Baseline:  Goal status: INITIAL  5. R knee flexion AROM to be at least 110* Baseline: Goal status: INITIAL     PLAN:  PT FREQUENCY: 2x/week  PT DURATION: 6 weeks  PLANNED INTERVENTIONS: 97750- Physical Performance Testing, 97110-Therapeutic exercises, 97530- Therapeutic activity, [id number]- Neuromuscular re-education, 97535- Self Care, [id number]-  Manual therapy, and [id number]- Gait training  PLAN FOR NEXT SESSION: needs updated HEP printed out, knee ROM and general strength. Hard DC at end of initial POC if not making significant progress   Josette Rough, PT, DPT 02/14/2024 11:51 AM  "

## 2024-02-21 ENCOUNTER — Other Ambulatory Visit: Payer: Self-pay

## 2024-02-21 ENCOUNTER — Ambulatory Visit

## 2024-02-21 DIAGNOSIS — G8929 Other chronic pain: Secondary | ICD-10-CM

## 2024-02-21 DIAGNOSIS — M6281 Muscle weakness (generalized): Secondary | ICD-10-CM

## 2024-02-21 DIAGNOSIS — M25551 Pain in right hip: Secondary | ICD-10-CM

## 2024-02-21 DIAGNOSIS — M5459 Other low back pain: Secondary | ICD-10-CM

## 2024-02-21 DIAGNOSIS — M25562 Pain in left knee: Secondary | ICD-10-CM | POA: Diagnosis not present

## 2024-02-21 NOTE — Therapy (Signed)
 " OUTPATIENT PHYSICAL THERAPY LOWER EXTREMITY TREATMENT   Patient Name: [name] MRN: [id number] DOB:1955/03/12, 69 y.o., female Today's Date: 02/21/2024  END OF SESSION:  PT End of Session - 02/21/24 1059     Visit Number 2    Number of Visits 13    Date for Recertification  03/27/24    Authorization Type [employer] MCR    Authorization Time Period TBD    Progress Note Due on Visit 10    PT Start Time 1100    PT Stop Time 1145    PT Time Calculation (min) 45 min    Activity Tolerance Patient tolerated treatment well    Behavior During Therapy WFL for tasks assessed/performed           Past Medical History:  Diagnosis Date   Arthritis    Asthma    CHF (congestive heart failure) (HCC)    Coronary artery disease    Diabetes mellitus    Diabetic neuropathy (HCC)    Dysrhythmia    A.fib    takes Eliquis    GERD (gastroesophageal reflux disease)    History of hiatal hernia    History of kidney stones    Hx of cardiovascular stress test    Lexiscan  Myoview (10/15):  Medium area of ischemia in AL and IL distribution, cannot completely exclude shifting breast attenuation, EF 64%; Abnormal study   Hyperlipidemia    Hypertension    Mild carotid artery disease    Morbid obesity (HCC)    PAF (paroxysmal atrial fibrillation) (HCC)    Sleep apnea    does not wear CPAP   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   Syncope    Past Surgical History:  Procedure Laterality Date   ACHILLES TENDON REPAIR  1995   right   BIOPSY  07/22/2021   Procedure: BIOPSY;  Surgeon: Saintclair Jasper, MD;  Location: [facility];  Service: Gastroenterology;;   CATARACT EXTRACTION Bilateral    COLONOSCOPY     COLONOSCOPY WITH PROPOFOL  N/A 07/22/2021   Procedure: COLONOSCOPY WITH PROPOFOL ;  Surgeon: Saintclair Jasper, MD;  Location: [facility];  Service: Gastroenterology;  Laterality: N/A;   CYSTOSCOPY W/ RETROGRADES Right 06/15/2023   Procedure: CYSTOSCOPY, WITH RETROGRADE PYELOGRAM;  Surgeon: Shane Steffan BROCKS, MD;  Location: [facility];  Service: Urology;  Laterality: Right;   CYSTOSCOPY/URETEROSCOPY/HOLMIUM LASER/STENT PLACEMENT Right 06/15/2023   Procedure: CYSTOSCOPY/URETEROSCOPY/HOLMIUM LASER/STENT PLACEMENT;  Surgeon: Shane Steffan BROCKS, MD;  Location: [facility];  Service: Urology;  Laterality: Right;   DILATION AND CURETTAGE OF UTERUS     ESOPHAGOGASTRODUODENOSCOPY (EGD) WITH PROPOFOL  N/A 07/22/2021   Procedure: ESOPHAGOGASTRODUODENOSCOPY (EGD) WITH PROPOFOL ;  Surgeon: Saintclair Jasper, MD;  Location: [facility];  Service: Gastroenterology;  Laterality: N/A;   EYE SURGERY Left    detached retina repair   HAND SURGERY Right    right  pinky finger   ORIF   heart stent  2007   PARS PLANA VITRECTOMY Left 03/24/2017   Procedure: PARS PLANA VITRECTOMY WITH 25 GAUGE;  Surgeon: Elner Arley LABOR, MD;  Location: [facility];  Service: Ophthalmology;  Laterality: Left;   POLYPECTOMY  07/22/2021   Procedure: POLYPECTOMY;  Surgeon: Saintclair Jasper, MD;  Location: [facility];  Service: Gastroenterology;;   UTERINE FIBROID SURGERY  2008   Patient Active Problem List   Diagnosis Date Noted   Abnormal EEG 01/18/2024   Adjustment disorder 01/18/2024   Allergic rhinitis 01/18/2024   Allergic rhinitis due to pollen 01/18/2024   Anxiety 01/18/2024   Cerebrovascular disease 01/18/2024  Claustrophobia 01/18/2024   Colon cancer screening 01/18/2024   Dysuria 01/18/2024   Epigastric pain 01/18/2024   Late effects of cerebrovascular disease 01/18/2024   Long term current use of insulin  (HCC) 01/18/2024   Loose stools 01/18/2024   Major depressive disorder, recurrent, moderate (HCC) 01/18/2024   Mild nonproliferative diabetic retinopathy associated with type 2 diabetes mellitus (HCC) 01/18/2024   Mild persistent asthma without complication 01/18/2024   Polyneuropathy due to type 2 diabetes mellitus (HCC) 01/18/2024   Primary insomnia 01/18/2024   Proliferative diabetic retinopathy associated with type 2 diabetes mellitus  (HCC) 01/18/2024   Pure hypercholesterolemia 01/18/2024   Situational stress 01/18/2024   Thrombophilia 01/18/2024   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, left eye (HCC) 01/18/2024   Type 2 diabetes mellitus with other specified complication (HCC) 01/18/2024   Trigger finger, left ring finger 08/10/2023   Trigger thumb of right hand 08/10/2023   Arthralgia of left knee 12/24/2022   Pain of lumbar spine 12/24/2022   Diabetic peripheral neuropathy (HCC) 09/11/2022   Disorder of both ulnar nerves 09/11/2022   Acquired trigger finger of left ring finger 08/31/2022   Bilateral carpal tunnel syndrome 08/31/2022   Pain in both hands 08/31/2022   Paroxysmal atrial fibrillation with RVR (HCC) 11/28/2020   Hypertensive urgency 11/28/2020   Syncope and collapse 11/28/2020   Nausea 11/28/2020   Hypokalemia 11/28/2020   Hyperglycemia due to diabetes mellitus (HCC) 11/28/2020   GERD (gastroesophageal reflux disease) 11/28/2020   Asthma 11/28/2020   Coronary atherosclerosis of native coronary artery 07/17/2013   Mixed hyperlipidemia 07/17/2013   Essential hypertension, benign 07/17/2013   Obesity 08/11/2011    PCP: Claudene Pellet MD   REFERRING PROVIDER: Melvenia Debby Cain, PA-C  REFERRING DIAG: [phone] (ICD-10-CM) - Pain in left knee  THERAPY DIAG:  Chronic pain of left knee  Bilateral hip pain  Muscle weakness (generalized)  Other low back pain  Bilateral chronic knee pain  Rationale for Evaluation and Treatment: Rehabilitation  ONSET DATE: chronic   SUBJECTIVE:   SUBJECTIVE STATEMENT: 02/21/24: I brought my blue theraband because I feel like I'm not doing it right  Left knee and B hips are a problem right now. I try to flip from side to side when sleeping but they stay sore. R foot is doing better after getting a shot in it. They put gel in the left knee but its still doing its thing, I would really like to avoid  surgery. Knee has been an issue for a long time. Someone at the office that did my gel shot told me to use my cane on the other side.   PERTINENT HISTORY: See above  PAIN:  Are you having pain? Yes: NPRS scale: 5/10 now, at worst 8/10 Pain location: L knee  Pain description: dull and just there Aggravating factors: being up on knee too much, lifting too much  Relieving factors: exercises- doing them every day recently    PRECAUTIONS: None  RED FLAGS: None   WEIGHT BEARING RESTRICTIONS: No  FALLS:  Has patient fallen in last 6 months? Yes. 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    PATIENT SURVEYS:  LEFS  Extreme difficulty/unable (0), Quite a bit of difficulty (1), Moderate difficulty (2), Little difficulty (3), No difficulty (4) Survey date:  02/14/24 eval   Any of your usual work, housework or school activities   2. Usual hobbies, recreational or sporting activities   3. Getting into/out of the bath   4. Walking between rooms   5. Putting on socks/shoes   6. Squatting    7. Lifting an object, like a bag of groceries from the floor   8. Performing light activities around your home   9. Performing heavy activities around your home   10. Getting into/out of a car   11. Walking 2 blocks   12. Walking 1 mile   13. Going up/down 10 stairs (1 flight)   14. Standing for 1 hour   15.  sitting for 1 hour   16. Running on  even ground   17. Running on uneven ground   18. Making sharp turns while running fast   19. Hopping    20. Rolling over in bed   Score total:  31/80     COGNITION: Overall cognitive status: Within functional limits for tasks assessed     SENSATION: Not tested    LOWER EXTREMITY ROM:  Active ROM Right eval Left eval  Hip flexion    Hip extension    Hip abduction    Hip adduction    Hip internal rotation    Hip external rotation    Knee flexion  ~90*, stiff   Knee extension  WNL   Ankle dorsiflexion    Ankle plantarflexion    Ankle inversion    Ankle eversion     (Blank rows = not tested)  LOWER EXTREMITY MMT:  MMT Right eval Left eval  Hip flexion 4 4  Hip extension    Hip abduction Seated 3 at best Seated 3 at best   Hip adduction    Hip internal rotation    Hip external rotation    Knee flexion 5 5  Knee extension 5 5 pain  Ankle dorsiflexion    Ankle plantarflexion    Ankle inversion    Ankle eversion     (Blank rows = not tested)                                                                                                                                  TREATMENT DATE:  02/21/24: Nustep: L 5 x 6 min Ue's and LE's Therapeutic exercise: Seated hip abd/ ER blue band around knees Seated marching blue t band around knees  In ll bars for heel/toe rocks In ll bars for standing SLR, 2 x 5 each In ll  bars for standing hip ext 2 x 5 each Knee ext 5# Knee flexion 25# In ll bars for staggered position with feet shoulder width apart and knees slightly bent, for rotational reaches, B hand with 5# kettle bell to opposite lateral knee 5 x each side With 5# kettle bell, mini squats to place bell on/ off 18 box   02/14/24  Eval, POC, HEP  Nustep L2x6 minutes seat 8 all four extremities   Education as below     PATIENT EDUCATION:  Education details: exam findings, POC, HEP, trial of PT but if pain does not improve along with objective measures  would benefit from return to MD for next steps in care, correct cane use  Person educated: Patient Education method: Explanation, Demonstration, and Handouts Education comprehension: verbalized understanding, returned demonstration, and needs further education  HOME EXERCISE PROGRAM: Access Code: [email or web address] URL: [email or web address] Date: 02/21/2024 Prepared by: Greig Dezman Granda  Exercises - Seated Hip Abduction/External Rotation With Resistance at Thighs  - 1 x daily - 3 x weekly - 3 sets - 10 reps - Seated March with Resistance  - 1 x daily - 3 x weekly - 3 sets - 10 reps - Heel Raise  - 1 x daily - 3 x weekly - 3 sets - 10 reps - Standing Hip Extension  - 1 x daily - 3 x weekly - 3 sets - 10 reps - Standing Repeated Hip Flexion with Ankle Weight  - 1 x daily - 3 x weekly - 3 sets - 10 reps TBD   ASSESSMENT:  CLINICAL IMPRESSION: 02/21/24:  Updated her HEP and provided written documents.  She has difficulty remembering the positioning, rationale for each exercise from one treatment to the next . Continuing to drive to [place] every weekend to check on her home but stays in this area during to week to take care of her sister.  Needs overall increase in her LE strength to improve her activity tolerance and pain levels    Patient is a 69 y.o. F very well known to this clinic, who was seen today for physical therapy evaluation and treatment for L knee pain. She reports some B hip pain preventing her from being able to sleep well especially in sidelying which we can work to address here as well. Had a gel shot in her L knee with limited relief. Will make every effort to address pain and objective limitations moving forward, may need to return to MD for next steps in care if pain does not improve in this round of skilled PT care.    OBJECTIVE IMPAIRMENTS: Abnormal gait, decreased activity tolerance, decreased mobility, difficulty walking, decreased ROM, decreased strength, obesity,  and pain.   ACTIVITY LIMITATIONS: standing, squatting, sleeping, stairs, transfers, and locomotion level  PARTICIPATION LIMITATIONS: meal prep, cleaning, laundry, driving, shopping, community activity, and yard work  PERSONAL FACTORS: Age, Education, Fitness, Past/current experiences, Social background, and Time since onset of injury/illness/exacerbation are also affecting patient's functional outcome.   REHAB POTENTIAL: Fair limited progress in terms of pain control with PT in the past   CLINICAL DECISION MAKING: Stable/uncomplicated  EVALUATION COMPLEXITY: Low   GOALS: Goals reviewed with patient? No  SHORT TERM GOALS: Target date: 03/06/2024   Will be compliant with appropriate progressive HEP  Baseline: Goal status: INITIAL  2.  Sx to have improved by 25%  Baseline:  Goal status: INITIAL    LONG TERM GOALS: Target date: 03/27/2024    MMT to have improved by at least  one grade in all weak groups  Baseline:  Goal status: INITIAL  2.  Will be able to sleep on her side as desired without increase in pain  Baseline:  Goal status: INITIAL  3.  Will be able to perform all care duties for her sister without difficulty or increased pain  Baseline:  Goal status: INITIAL  4.  LEFS to improve by 10 points Baseline:  Goal status: INITIAL  5. R knee flexion AROM to be at least 110* Baseline: Goal status: INITIAL     PLAN:  PT FREQUENCY: 2x/week  PT DURATION: 6 weeks  PLANNED INTERVENTIONS: 97750- Physical Performance Testing, 97110-Therapeutic exercises, 97530- Therapeutic activity, [id number]- Neuromuscular re-education, 97535- Self Care, [id number]- Manual therapy, and [id number]- Gait training  PLAN FOR NEXT SESSION: needs updated HEP printed out, knee ROM and general strength. Hard DC at end of initial POC if not making significant progress   Greig Credit, PT, DPT, OCS 02/21/2024 5:43 PM  "

## 2024-02-24 ENCOUNTER — Ambulatory Visit: Admitting: Physical Therapy

## 2024-02-24 ENCOUNTER — Encounter: Payer: Self-pay | Admitting: Physical Therapy

## 2024-02-24 DIAGNOSIS — M6281 Muscle weakness (generalized): Secondary | ICD-10-CM

## 2024-02-24 DIAGNOSIS — M25551 Pain in right hip: Secondary | ICD-10-CM

## 2024-02-24 DIAGNOSIS — G8929 Other chronic pain: Secondary | ICD-10-CM

## 2024-02-24 DIAGNOSIS — M25562 Pain in left knee: Secondary | ICD-10-CM | POA: Diagnosis not present

## 2024-02-24 NOTE — Therapy (Signed)
 " OUTPATIENT PHYSICAL THERAPY LOWER EXTREMITY TREATMENT   Patient Name: [name] MRN: [id number] DOB:1955/06/30, 69 y.o., female Today's Date: 02/24/2024  END OF SESSION:  PT End of Session - 02/24/24 0824     Visit Number 3    Number of Visits 13    Date for Recertification  03/27/24    Authorization Type [employer] [facility]    Authorization Time Period auth good until 03/27/24, 8 visits    Authorization - Visit Number 2    Authorization - Number of Visits 8    PT Start Time 0803    PT Stop Time 0841    PT Time Calculation (min) 38 min    Activity Tolerance Patient limited by pain;Patient limited by fatigue    Behavior During Therapy [facility] for tasks assessed/performed            Past Medical History:  Diagnosis Date   Arthritis    Asthma    CHF (congestive heart failure) (HCC)    Coronary artery disease    Diabetes mellitus    Diabetic neuropathy (HCC)    Dysrhythmia    A.fib    takes Eliquis    GERD (gastroesophageal reflux disease)    History of hiatal hernia    History of kidney stones    Hx of cardiovascular stress test    Lexiscan  Myoview (10/15):  Medium area of ischemia in AL and IL distribution, cannot completely exclude shifting breast attenuation, EF 64%; Abnormal study   Hyperlipidemia    Hypertension    Mild carotid artery disease    Morbid obesity (HCC)    PAF (paroxysmal atrial fibrillation) (HCC)    Sleep apnea    does not wear CPAP   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   Syncope    Past Surgical History:  Procedure Laterality Date   ACHILLES TENDON REPAIR  1995   right   BIOPSY  07/22/2021   Procedure: BIOPSY;  Surgeon: Saintclair Jasper, MD;  Location: [facility];  Service: Gastroenterology;;   CATARACT EXTRACTION Bilateral    COLONOSCOPY     COLONOSCOPY WITH PROPOFOL  N/A 07/22/2021   Procedure: COLONOSCOPY WITH PROPOFOL ;  Surgeon: Saintclair Jasper, MD;  Location: [facility];  Service: Gastroenterology;  Laterality: N/A;   CYSTOSCOPY W/ RETROGRADES Right  06/15/2023   Procedure: CYSTOSCOPY, WITH RETROGRADE PYELOGRAM;  Surgeon: Shane Steffan BROCKS, MD;  Location: [facility];  Service: Urology;  Laterality: Right;   CYSTOSCOPY/URETEROSCOPY/HOLMIUM LASER/STENT PLACEMENT Right 06/15/2023   Procedure: CYSTOSCOPY/URETEROSCOPY/HOLMIUM LASER/STENT PLACEMENT;  Surgeon: Shane Steffan BROCKS, MD;  Location: [facility];  Service: Urology;  Laterality: Right;   DILATION AND CURETTAGE OF UTERUS     ESOPHAGOGASTRODUODENOSCOPY (EGD) WITH PROPOFOL  N/A 07/22/2021   Procedure: ESOPHAGOGASTRODUODENOSCOPY (EGD) WITH PROPOFOL ;  Surgeon: Saintclair Jasper, MD;  Location: [facility];  Service: Gastroenterology;  Laterality: N/A;   EYE SURGERY Left    detached retina repair   HAND SURGERY Right    right  pinky finger   ORIF   heart stent  2007   PARS PLANA VITRECTOMY Left 03/24/2017   Procedure: PARS PLANA VITRECTOMY WITH 25 GAUGE;  Surgeon: Elner Arley LABOR, MD;  Location: [facility] OR;  Service: Ophthalmology;  Laterality: Left;   POLYPECTOMY  07/22/2021   Procedure: POLYPECTOMY;  Surgeon: Saintclair Jasper, MD;  Location: [facility];  Service: Gastroenterology;;   UTERINE FIBROID SURGERY  2008   Patient Active Problem List   Diagnosis Date Noted   Abnormal EEG 01/18/2024   Adjustment disorder 01/18/2024   Allergic rhinitis 01/18/2024  Allergic rhinitis due to pollen 01/18/2024   Anxiety 01/18/2024   Cerebrovascular disease 01/18/2024   Claustrophobia 01/18/2024   Colon cancer screening 01/18/2024   Dysuria 01/18/2024   Epigastric pain 01/18/2024   Late effects of cerebrovascular disease 01/18/2024   Long term current use of insulin  (HCC) 01/18/2024   Loose stools 01/18/2024   Major depressive disorder, recurrent, moderate (HCC) 01/18/2024   Mild nonproliferative diabetic retinopathy associated with type 2 diabetes mellitus (HCC) 01/18/2024   Mild persistent asthma without complication 01/18/2024   Polyneuropathy due to type 2 diabetes mellitus (HCC) 01/18/2024   Primary insomnia  01/18/2024   Proliferative diabetic retinopathy associated with type 2 diabetes mellitus (HCC) 01/18/2024   Pure hypercholesterolemia 01/18/2024   Situational stress 01/18/2024   Thrombophilia 01/18/2024   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, left eye (HCC) 01/18/2024   Type 2 diabetes mellitus with other specified complication (HCC) 01/18/2024   Trigger finger, left ring finger 08/10/2023   Trigger thumb of right hand 08/10/2023   Arthralgia of left knee 12/24/2022   Pain of lumbar spine 12/24/2022   Diabetic peripheral neuropathy (HCC) 09/11/2022   Disorder of both ulnar nerves 09/11/2022   Acquired trigger finger of left ring finger 08/31/2022   Bilateral carpal tunnel syndrome 08/31/2022   Pain in both hands 08/31/2022   Paroxysmal atrial fibrillation with RVR (HCC) 11/28/2020   Hypertensive urgency 11/28/2020   Syncope and collapse 11/28/2020   Nausea 11/28/2020   Hypokalemia 11/28/2020   Hyperglycemia due to diabetes mellitus (HCC) 11/28/2020   GERD (gastroesophageal reflux disease) 11/28/2020   Asthma 11/28/2020   Coronary atherosclerosis of native coronary artery 07/17/2013   Mixed hyperlipidemia 07/17/2013   Essential hypertension, benign 07/17/2013   Obesity 08/11/2011    PCP: Claudene Pellet MD   REFERRING PROVIDER: Melvenia Debby Cain, PA-C  REFERRING DIAG: [phone] (ICD-10-CM) - Pain in left knee  THERAPY DIAG:  Chronic pain of left knee  Bilateral hip pain  Muscle weakness (generalized)  Rationale for Evaluation and Treatment: Rehabilitation  ONSET DATE: chronic   SUBJECTIVE:   SUBJECTIVE STATEMENT:    Doing OK, just cold today. Stepped down with L knee and that was a mistake.      EVAL: Left knee and B hips are a problem right now. I try to flip from side to side when sleeping but they stay sore. R foot is doing better after getting a shot in it. They put gel in the left knee but its still  doing its thing, I would really like to avoid surgery. Knee has been an issue for a long time. Someone at the office that did my gel shot told me to use my cane on the other side.   PERTINENT HISTORY: See above  PAIN:  Are you having pain? Yes: NPRS scale: 4/10 Pain location: L knee  Pain description: dull and just there, toothache Aggravating factors: being up on knee too much, lifting too much  Relieving factors: exercises- doing them every day recently    PRECAUTIONS: None  RED FLAGS: None   WEIGHT BEARING RESTRICTIONS: No  FALLS:  Has patient fallen in last 6 months? Yes. 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    PATIENT SURVEYS:  LEFS  Extreme difficulty/unable (0), Quite a bit of difficulty (1), Moderate difficulty (2), Little difficulty (3), No difficulty (4) Survey date:  02/14/24 eval   Any of your usual work, housework or school activities   2. Usual hobbies, recreational or sporting activities   3. Getting into/out of the bath   4. Walking between rooms   5. Putting on socks/shoes   6. Squatting    7. Lifting an object, like a bag of groceries from the floor   8. Performing light activities around your home   9. Performing heavy activities around your home   10. Getting into/out of a car   11. Walking 2 blocks   12. Walking 1 mile   13. Going up/down 10 stairs (1 flight)   14. Standing for 1 hour   15.   sitting for 1 hour   16. Running on even ground   17. Running on uneven ground   18. Making sharp turns while running fast   19. Hopping    20. Rolling over in bed   Score total:  31/80     COGNITION: Overall cognitive status: Within functional limits for tasks assessed     SENSATION: Not tested    LOWER EXTREMITY ROM:  Active ROM Left eval L 02/24/24  Hip flexion    Hip extension    Hip abduction    Hip adduction    Hip internal rotation    Hip external rotation    Knee flexion ~90*, stiff  ~97* seated AROM   Knee extension WNL    Ankle dorsiflexion    Ankle plantarflexion    Ankle inversion    Ankle eversion     (Blank rows = not tested)  LOWER EXTREMITY MMT:  MMT Right eval Left eval  Hip flexion 4 4  Hip extension    Hip abduction Seated 3 at best Seated 3 at best   Hip adduction    Hip internal rotation    Hip external rotation    Knee flexion 5 5  Knee extension 5 5 pain  Ankle dorsiflexion    Ankle plantarflexion    Ankle inversion    Ankle eversion     (Blank rows = not tested)                                                                                                                                  TREATMENT DATE:   02/24/24  Attempted bike for knee ROM, unable to tolerate Nustep L5x8 minutes all four extremities, seat 8 ROM check, goals  LAQs 5# 2x15 B  STS with 5#  in goblet hold 2x10  Hip hikes x10 B Hip hikes + ABD x10 B Tried figure 4 stetch seated- R x30s, unable to tolerate L due to knee pain Standing HS stretch 1x30 seconds B       02/21/24: Nustep: L 5 x 6 min Ue's and LE's Therapeutic exercise: Seated hip abd/ ER blue band around knees Seated marching blue t band around knees  In ll bars for heel/toe rocks In ll bars for standing SLR, 2 x 5 each In ll bars for standing hip ext 2 x 5 each Knee ext 5# Knee flexion 25# In ll bars for staggered position with feet shoulder width apart and knees slightly bent, for  rotational reaches, B hand with 5# kettle bell to opposite lateral knee 5 x each side With 5# kettle bell, mini squats to place bell on/ off 18 box   02/14/24  Eval, POC, HEP  Nustep L2x6 minutes seat 8 all four extremities   Education as below     PATIENT EDUCATION:  Education details: exam findings, POC, HEP, trial of PT but if pain does not improve along with objective measures would benefit from return to MD for next steps in care, correct cane use  Person educated: Patient Education method: Explanation, Demonstration, and Handouts Education comprehension: verbalized understanding, returned demonstration, and needs further education  HOME EXERCISE PROGRAM: Access Code: [email or web address] URL: [email or web address] Date: 02/21/2024 Prepared by: Greig Speaks  Exercises - Seated Hip Abduction/External Rotation With Resistance at Thighs  - 1 x daily - 3 x weekly - 3 sets - 10 reps - Seated March with Resistance  - 1 x daily - 3 x weekly - 3 sets - 10 reps - Heel Raise  - 1 x daily - 3 x weekly - 3 sets - 10 reps - Standing Hip Extension  - 1 x daily - 3 x weekly - 3 sets - 10 reps - Standing Repeated Hip Flexion with Ankle Weight  - 1 x daily - 3 x weekly - 3 sets - 10 reps    ASSESSMENT:  CLINICAL IMPRESSION:   Arrived today doing OK, we kept working on functional strengthening and knee ROM this visit. Had to modify some interventions due to pain. Gave her a new blue band as she reports she lost hers at home. General prognosis remains fair, will monitor progress closely.    EVAL: Patient is a 69 y.o. F very well known to this clinic, who was seen today for physical therapy evaluation and treatment for L knee pain. She reports some B hip pain preventing her from being able to sleep well especially in sidelying which we can work to address here as well. Had a gel shot in her L knee with limited relief. Will make every effort to address pain and objective limitations moving  forward, may need to return to MD for next steps in care if pain does not improve in this round of skilled PT care.    OBJECTIVE IMPAIRMENTS: Abnormal gait, decreased activity tolerance, decreased mobility, difficulty walking, decreased ROM, decreased strength, obesity, and pain.   ACTIVITY LIMITATIONS: standing, squatting, sleeping, stairs, transfers, and locomotion level  PARTICIPATION LIMITATIONS: meal prep, cleaning, laundry, driving, shopping, community activity, and yard work  PERSONAL FACTORS: Age, Education, Fitness, Past/current experiences, Social background, and Time since onset of injury/illness/exacerbation are also affecting patient's functional outcome.   REHAB POTENTIAL: Fair limited progress in terms of pain control with PT in the past   CLINICAL DECISION MAKING: Stable/uncomplicated  EVALUATION COMPLEXITY: Low   GOALS: Goals reviewed with patient? No  SHORT TERM GOALS: Target date: 03/06/2024   Will be compliant with appropriate progressive HEP  Baseline: Goal status: INITIAL  2.  Sx to have improved by 25%  Baseline:  Goal status: INITIAL    LONG TERM GOALS: Target date: 03/27/2024    MMT to have improved by at least one grade in all weak groups  Baseline:  Goal status: INITIAL  2.  Will be able to sleep on her side as desired without increase in pain  Baseline:  Goal status: INITIAL  3.  Will be able to perform all care duties for her sister without difficulty or increased pain  Baseline:  Goal status: INITIAL  4.  LEFS to improve by 10 points Baseline:  Goal status: INITIAL  5. L knee flexion AROM to be at least 110* Baseline: Goal status: ONGOING/ADDENDED 02/24/24 see chart, PT accidentally listed R instead of L knee for goal at eval     PLAN:  PT FREQUENCY: 2x/week  PT DURATION: 6 weeks  PLANNED INTERVENTIONS: 97750- Physical Performance Testing, 97110-Therapeutic exercises, 97530- Therapeutic activity, 97112- Neuromuscular  re-education, 97535- Self Care, [id number]- Manual therapy, and 97116- Gait training  PLAN FOR NEXT SESSION: needs updated HEP printed out, knee ROM and general strength. Hard DC at end of initial POC if not making significant progress, monitor closely   Josette Rough, PT, DPT 02/24/24 8:42 AM  "

## 2024-02-28 ENCOUNTER — Encounter: Payer: Self-pay | Admitting: Physical Therapy

## 2024-02-28 ENCOUNTER — Ambulatory Visit: Admitting: Physical Therapy

## 2024-02-28 DIAGNOSIS — M25551 Pain in right hip: Secondary | ICD-10-CM

## 2024-02-28 DIAGNOSIS — M5459 Other low back pain: Secondary | ICD-10-CM

## 2024-02-28 DIAGNOSIS — G8929 Other chronic pain: Secondary | ICD-10-CM

## 2024-02-28 DIAGNOSIS — M6281 Muscle weakness (generalized): Secondary | ICD-10-CM

## 2024-02-28 DIAGNOSIS — M25562 Pain in left knee: Secondary | ICD-10-CM | POA: Diagnosis not present

## 2024-02-28 NOTE — Therapy (Signed)
 " OUTPATIENT PHYSICAL THERAPY LOWER EXTREMITY TREATMENT   Patient Name: [name] MRN: [id number] DOB:[birth year], 69 y.o., female Today's Date: 02/28/2024  END OF SESSION:  PT End of Session - 02/28/24 1021     Visit Number 4    Date for Recertification  03/27/24    PT Start Time 1015    PT Stop Time 1100    PT Time Calculation (min) 45 min    Activity Tolerance Patient limited by pain;Patient limited by fatigue    Behavior During Therapy [facility] for tasks assessed/performed            Past Medical History:  Diagnosis Date   Arthritis    Asthma    CHF (congestive heart failure) (HCC)    Coronary artery disease    Diabetes mellitus    Diabetic neuropathy (HCC)    Dysrhythmia    A.fib    takes Eliquis    GERD (gastroesophageal reflux disease)    History of hiatal hernia    History of kidney stones    Hx of cardiovascular stress test    Lexiscan  Myoview (10/15):  Medium area of ischemia in AL and IL distribution, cannot completely exclude shifting breast attenuation, EF 64%; Abnormal study   Hyperlipidemia    Hypertension    Mild carotid artery disease    Morbid obesity (HCC)    PAF (paroxysmal atrial fibrillation) (HCC)    Sleep apnea    does not wear CPAP   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   Syncope    Past Surgical History:  Procedure Laterality Date   ACHILLES TENDON REPAIR  1995   right   BIOPSY  07/22/2021   Procedure: BIOPSY;  Surgeon: Saintclair Jasper, MD;  Location: [facility];  Service: Gastroenterology;;   CATARACT EXTRACTION Bilateral    COLONOSCOPY     COLONOSCOPY WITH PROPOFOL  N/A 07/22/2021   Procedure: COLONOSCOPY WITH PROPOFOL ;  Surgeon: Saintclair Jasper, MD;  Location: [facility];  Service: Gastroenterology;  Laterality: N/A;   CYSTOSCOPY W/ RETROGRADES Right 06/15/2023   Procedure: CYSTOSCOPY, WITH RETROGRADE PYELOGRAM;  Surgeon: Shane Steffan BROCKS, MD;  Location: [facility];  Service: Urology;  Laterality: Right;   CYSTOSCOPY/URETEROSCOPY/HOLMIUM  LASER/STENT PLACEMENT Right 06/15/2023   Procedure: CYSTOSCOPY/URETEROSCOPY/HOLMIUM LASER/STENT PLACEMENT;  Surgeon: Shane Steffan BROCKS, MD;  Location: [facility];  Service: Urology;  Laterality: Right;   DILATION AND CURETTAGE OF UTERUS     ESOPHAGOGASTRODUODENOSCOPY (EGD) WITH PROPOFOL  N/A 07/22/2021   Procedure: ESOPHAGOGASTRODUODENOSCOPY (EGD) WITH PROPOFOL ;  Surgeon: Saintclair Jasper, MD;  Location: [facility];  Service: Gastroenterology;  Laterality: N/A;   EYE SURGERY Left    detached retina repair   HAND SURGERY Right    right  pinky finger   ORIF   heart stent  2007   PARS PLANA VITRECTOMY Left 03/24/2017   Procedure: PARS PLANA VITRECTOMY WITH 25 GAUGE;  Surgeon: Elner Arley LABOR, MD;  Location: [facility] OR;  Service: Ophthalmology;  Laterality: Left;   POLYPECTOMY  07/22/2021   Procedure: POLYPECTOMY;  Surgeon: Saintclair Jasper, MD;  Location: [facility];  Service: Gastroenterology;;   UTERINE FIBROID SURGERY  2008   Patient Active Problem List   Diagnosis Date Noted   Abnormal EEG 01/18/2024   Adjustment disorder 01/18/2024   Allergic rhinitis 01/18/2024   Allergic rhinitis due to pollen 01/18/2024   Anxiety 01/18/2024   Cerebrovascular disease 01/18/2024   Claustrophobia 01/18/2024   Colon cancer screening 01/18/2024   Dysuria 01/18/2024   Epigastric pain 01/18/2024   Late effects of cerebrovascular disease  01/18/2024   Long term current use of insulin  (HCC) 01/18/2024   Loose stools 01/18/2024   Major depressive disorder, recurrent, moderate (HCC) 01/18/2024   Mild nonproliferative diabetic retinopathy associated with type 2 diabetes mellitus (HCC) 01/18/2024   Mild persistent asthma without complication 01/18/2024   Polyneuropathy due to type 2 diabetes mellitus (HCC) 01/18/2024   Primary insomnia 01/18/2024   Proliferative diabetic retinopathy associated with type 2 diabetes mellitus (HCC) 01/18/2024   Pure hypercholesterolemia 01/18/2024   Situational stress 01/18/2024    Thrombophilia 01/18/2024   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, left eye (HCC) 01/18/2024   Type 2 diabetes mellitus with other specified complication (HCC) 01/18/2024   Trigger finger, left ring finger 08/10/2023   Trigger thumb of right hand 08/10/2023   Arthralgia of left knee 12/24/2022   Pain of lumbar spine 12/24/2022   Diabetic peripheral neuropathy (HCC) 09/11/2022   Disorder of both ulnar nerves 09/11/2022   Acquired trigger finger of left ring finger 08/31/2022   Bilateral carpal tunnel syndrome 08/31/2022   Pain in both hands 08/31/2022   Paroxysmal atrial fibrillation with RVR (HCC) 11/28/2020   Hypertensive urgency 11/28/2020   Syncope and collapse 11/28/2020   Nausea 11/28/2020   Hypokalemia 11/28/2020   Hyperglycemia due to diabetes mellitus (HCC) 11/28/2020   GERD (gastroesophageal reflux disease) 11/28/2020   Asthma 11/28/2020   Coronary atherosclerosis of native coronary artery 07/17/2013   Mixed hyperlipidemia 07/17/2013   Essential hypertension, benign 07/17/2013   Obesity 08/11/2011    PCP: Claudene Pellet MD   REFERRING PROVIDER: Melvenia Debby Cain, PA-C  REFERRING DIAG: [phone] (ICD-10-CM) - Pain in left knee  THERAPY DIAG:  Chronic pain of left knee  Bilateral hip pain  Muscle weakness (generalized)  Other low back pain  Bilateral chronic knee pain  Rationale for Evaluation and Treatment: Rehabilitation  ONSET DATE: chronic   SUBJECTIVE:   SUBJECTIVE STATEMENT:    Doing OK.      EVAL: Left knee and B hips are a problem right now. I try to flip from side to side when sleeping but they stay sore. R foot is doing better after getting a shot in it. They put gel in the left knee but its still doing its thing, I would really like to avoid surgery. Knee has been an issue for a long time. Someone at the office that did my gel shot told me to use my cane on the other side.   PERTINENT  HISTORY: See above  PAIN:  Are you having pain? Yes: NPRS scale: 0/10 Pain location: L knee  Pain description: dull and just there, toothache Aggravating factors: being up on knee too much, lifting too much  Relieving factors: exercises- doing them every day recently    PRECAUTIONS: None  RED FLAGS: None   WEIGHT BEARING RESTRICTIONS: No  FALLS:  Has patient fallen in last 6 months? Yes. 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    PATIENT SURVEYS:  LEFS  Extreme difficulty/unable (0), Quite a bit of difficulty (1), Moderate difficulty (2), Little difficulty (3), No difficulty (4) Survey date:  02/14/24 eval   Any of your usual work, housework or school activities   2. Usual hobbies, recreational or sporting activities   3. Getting into/out of the bath   4. Walking between rooms   5. Putting on socks/shoes   6. Squatting    7. Lifting an object, like a bag of groceries from the floor   8. Performing light activities around your home   9. Performing heavy activities around your home   10. Getting into/out of a car   11. Walking 2 blocks   12. Walking 1 mile   13. Going up/down 10 stairs (1 flight)   14. Standing for 1 hour   15.  sitting for 1 hour   16. Running on even ground   17. Running on uneven ground   18. Making sharp turns while running fast   19. Hopping    20. Rolling over in bed   Score total:  31/80      COGNITION: Overall cognitive status: Within functional limits for tasks assessed     SENSATION: Not tested    LOWER EXTREMITY ROM:  Active ROM Left eval L 02/24/24  Hip flexion    Hip extension    Hip abduction    Hip adduction    Hip internal rotation    Hip external rotation    Knee flexion ~90*, stiff  ~97* seated AROM   Knee extension WNL    Ankle dorsiflexion    Ankle plantarflexion    Ankle inversion    Ankle eversion     (Blank rows = not tested)  LOWER EXTREMITY MMT:  MMT Right eval Left eval  Hip flexion 4 4  Hip extension    Hip abduction Seated 3 at best Seated 3 at best   Hip adduction    Hip internal rotation    Hip external rotation    Knee flexion 5 5  Knee extension 5 5 pain  Ankle dorsiflexion    Ankle plantarflexion    Ankle inversion    Ankle eversion     (Blank rows = not tested)                                                                                                                                  TREATMENT DATE:  02/28/24 NuStep L5 x 7 min Sit to stands 2x10 LAQ x10  4in step ups 2 rails 2x5 Standing march 2x10 Heel raises 2x10   02/24/24  Attempted bike for knee ROM, unable to tolerate Nustep L5x8 minutes all four extremities, seat 8 ROM check, goals  LAQs 5# 2x15 B  STS with 5# in goblet hold 2x10  Hip hikes x10 B Hip hikes + ABD x10 B Tried figure 4  stetch seated- R x30s, unable to tolerate L due to knee pain Standing HS stretch 1x30 seconds B       02/21/24: Nustep: L 5 x 6 min Ue's and LE's Therapeutic exercise: Seated hip abd/ ER blue band around knees Seated marching blue t band around knees  In ll bars for heel/toe rocks In ll bars for standing SLR, 2 x 5 each In ll bars for standing hip ext 2 x 5 each Knee ext 5# Knee flexion 25# In ll bars for staggered position with feet shoulder width apart and knees slightly bent, for rotational reaches, B hand with 5# kettle bell to opposite lateral knee  5 x each side With 5# kettle bell, mini squats to place bell on/ off 18 box   02/14/24  Eval, POC, HEP  Nustep L2x6 minutes seat 8 all four extremities   Education as below     PATIENT EDUCATION:  Education details: exam findings, POC, HEP, trial of PT but if pain does not improve along with objective measures would benefit from return to MD for next steps in care, correct cane use  Person educated: Patient Education method: Explanation, Demonstration, and Handouts Education comprehension: verbalized understanding, returned demonstration, and needs further education  HOME EXERCISE PROGRAM: Access Code: [email or web address] URL: [email or web address] Date: 02/28/2024 Prepared by: Tanda Sorrow  Exercises - Seated Hip Abduction/External Rotation With Resistance at Thighs  - 1 x daily - 3 x weekly - 3 sets - 10 reps - Seated March with Resistance  - 1 x daily - 3 x weekly - 3 sets - 10 reps - Heel Raise  - 1 x daily - 3 x weekly - 3 sets - 10 reps - Standing Hip Extension  - 1 x daily - 3 x weekly - 3 sets - 10 reps - Standing Repeated Hip Flexion with Ankle Weight  - 1 x daily - 3 x weekly - 3 sets - 10 reps - Seated Long Arc Quad  - 1 x daily - 7 x weekly - 3 sets - 10 reps - Sit to Stand  - 1 x daily - 7 x weekly - 3 sets - 10 reps ASSESSMENT:   CLINICAL IMPRESSION:   Arrived today doing OK, pt was given updated HEP.  we kept working on functional strengthening and knee ROM this visit. Increase L knee pain with eccentric loads during step ups.  Little elevation with heel raises.  General prognosis remains fair, will monitor progress closely.    EVAL: Patient is a 69 y.o. F very well known to this clinic, who was seen today for physical therapy evaluation and treatment for L knee pain. She reports some B hip pain preventing her from being able to sleep well especially in sidelying which we can work to address here as well. Had a gel shot in her L knee with limited relief.  Will make every effort to address pain and objective limitations moving forward, may need to return to MD for next steps in care if pain does not improve in this round of skilled PT care.    OBJECTIVE IMPAIRMENTS: Abnormal gait, decreased activity tolerance, decreased mobility, difficulty walking, decreased ROM, decreased strength, obesity, and pain.   ACTIVITY LIMITATIONS: standing, squatting, sleeping, stairs, transfers, and locomotion level  PARTICIPATION LIMITATIONS: meal prep, cleaning, laundry, driving, shopping, community activity, and yard work  PERSONAL FACTORS: Age, Education, Fitness, Past/current experiences, Social background, and Time since onset of injury/illness/exacerbation are also affecting patient's functional outcome.   REHAB  POTENTIAL: Fair limited progress in terms of pain control with PT in the past   CLINICAL DECISION MAKING: Stable/uncomplicated  EVALUATION COMPLEXITY: Low   GOALS: Goals reviewed with patient? No  SHORT TERM GOALS: Target date: 03/06/2024   Will be compliant with appropriate progressive HEP  Baseline: Goal status: INITIAL  2.  5x to have improved by 25%  Baseline:  Goal status: INITIAL    LONG TERM GOALS: Target date: 03/27/2024    MMT to have improved by at least one grade in all weak groups  Baseline:  Goal status: INITIAL  2.  Will be able to sleep on her side as desired without increase in pain  Baseline:  Goal status: INITIAL  3.  Will be able to perform all care duties for her sister without difficulty or increased pain  Baseline:  Goal status: INITIAL  4.  LEFS to improve by 10 points Baseline:  Goal status: INITIAL  5. L knee flexion AROM to be at least 110* Baseline: Goal status: ONGOING/ADDENDED 02/24/24 see chart, PT accidentally listed R instead of L knee for goal at eval     PLAN:  PT FREQUENCY: 2x/week  PT DURATION: 6 weeks  PLANNED INTERVENTIONS: 97750- Physical Performance Testing, 97110-Therapeutic  exercises, 97530- Therapeutic activity, 97112- Neuromuscular re-education, 97535- Self Care, [id number]- Manual therapy, and [id number]- Gait training  PLAN FOR NEXT SESSION:  knee ROM and general strength. Hard DC at end of initial POC if not making significant progress, monitor closely   Tanda Sorrow, PTA 02/28/24 10:21 AM  "

## 2024-03-01 ENCOUNTER — Ambulatory Visit: Admitting: Physical Therapy

## 2024-03-01 ENCOUNTER — Encounter: Payer: Self-pay | Admitting: Physical Therapy

## 2024-03-01 DIAGNOSIS — M25551 Pain in right hip: Secondary | ICD-10-CM

## 2024-03-01 DIAGNOSIS — M6281 Muscle weakness (generalized): Secondary | ICD-10-CM

## 2024-03-01 DIAGNOSIS — G8929 Other chronic pain: Secondary | ICD-10-CM

## 2024-03-01 DIAGNOSIS — M25562 Pain in left knee: Secondary | ICD-10-CM | POA: Diagnosis not present

## 2024-03-01 DIAGNOSIS — M5459 Other low back pain: Secondary | ICD-10-CM

## 2024-03-01 NOTE — Therapy (Signed)
 " OUTPATIENT PHYSICAL THERAPY LOWER EXTREMITY TREATMENT   Patient Name: [name] MRN: [id number] DOB:1955-05-12, 69 y.o., female Today's Date: 03/01/2024  END OF SESSION:  PT End of Session - 03/01/24 1020     Visit Number 5    Date for Recertification  03/27/24    PT Start Time 1015    PT Stop Time 1100    PT Time Calculation (min) 45 min    Activity Tolerance Patient limited by pain;Patient limited by fatigue    Behavior During Therapy [facility] for tasks assessed/performed            Past Medical History:  Diagnosis Date   Arthritis    Asthma    CHF (congestive heart failure) (HCC)    Coronary artery disease    Diabetes mellitus    Diabetic neuropathy (HCC)    Dysrhythmia    A.fib    takes Eliquis    GERD (gastroesophageal reflux disease)    History of hiatal hernia    History of kidney stones    Hx of cardiovascular stress test    Lexiscan  Myoview (10/15):  Medium area of ischemia in AL and IL distribution, cannot completely exclude shifting breast attenuation, EF 64%; Abnormal study   Hyperlipidemia    Hypertension    Mild carotid artery disease    Morbid obesity (HCC)    PAF (paroxysmal atrial fibrillation) (HCC)    Sleep apnea    does not wear CPAP   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   Syncope    Past Surgical History:  Procedure Laterality Date   ACHILLES TENDON REPAIR  1995   right   BIOPSY  07/22/2021   Procedure: BIOPSY;  Surgeon: Saintclair Jasper, MD;  Location: [facility];  Service: Gastroenterology;;   CATARACT EXTRACTION Bilateral    COLONOSCOPY     COLONOSCOPY WITH PROPOFOL  N/A 07/22/2021   Procedure: COLONOSCOPY WITH PROPOFOL ;  Surgeon: Saintclair Jasper, MD;  Location: [facility];  Service: Gastroenterology;  Laterality: N/A;   CYSTOSCOPY W/ RETROGRADES Right 06/15/2023   Procedure: CYSTOSCOPY, WITH RETROGRADE PYELOGRAM;  Surgeon: Shane Steffan BROCKS, MD;  Location: [facility];  Service: Urology;  Laterality: Right;   CYSTOSCOPY/URETEROSCOPY/HOLMIUM  LASER/STENT PLACEMENT Right 06/15/2023   Procedure: CYSTOSCOPY/URETEROSCOPY/HOLMIUM LASER/STENT PLACEMENT;  Surgeon: Shane Steffan BROCKS, MD;  Location: [facility];  Service: Urology;  Laterality: Right;   DILATION AND CURETTAGE OF UTERUS     ESOPHAGOGASTRODUODENOSCOPY (EGD) WITH PROPOFOL  N/A 07/22/2021   Procedure: ESOPHAGOGASTRODUODENOSCOPY (EGD) WITH PROPOFOL ;  Surgeon: Saintclair Jasper, MD;  Location: [facility];  Service: Gastroenterology;  Laterality: N/A;   EYE SURGERY Left    detached retina repair   HAND SURGERY Right    right  pinky finger   ORIF   heart stent  2007   PARS PLANA VITRECTOMY Left 03/24/2017   Procedure: PARS PLANA VITRECTOMY WITH 25 GAUGE;  Surgeon: Elner Arley LABOR, MD;  Location: [facility];  Service: Ophthalmology;  Laterality: Left;   POLYPECTOMY  07/22/2021   Procedure: POLYPECTOMY;  Surgeon: Saintclair Jasper, MD;  Location: [facility];  Service: Gastroenterology;;   UTERINE FIBROID SURGERY  2008   Patient Active Problem List   Diagnosis Date Noted   Abnormal EEG 01/18/2024   Adjustment disorder 01/18/2024   Allergic rhinitis 01/18/2024   Allergic rhinitis due to pollen 01/18/2024   Anxiety 01/18/2024   Cerebrovascular disease 01/18/2024   Claustrophobia 01/18/2024   Colon cancer screening 01/18/2024   Dysuria 01/18/2024   Epigastric pain 01/18/2024   Late effects of cerebrovascular disease  01/18/2024   Long term current use of insulin  (HCC) 01/18/2024   Loose stools 01/18/2024   Major depressive disorder, recurrent, moderate (HCC) 01/18/2024   Mild nonproliferative diabetic retinopathy associated with type 2 diabetes mellitus (HCC) 01/18/2024   Mild persistent asthma without complication 01/18/2024   Polyneuropathy due to type 2 diabetes mellitus (HCC) 01/18/2024   Primary insomnia 01/18/2024   Proliferative diabetic retinopathy associated with type 2 diabetes mellitus (HCC) 01/18/2024   Pure hypercholesterolemia 01/18/2024   Situational stress 01/18/2024    Thrombophilia 01/18/2024   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, left eye (HCC) 01/18/2024   Type 2 diabetes mellitus with other specified complication (HCC) 01/18/2024   Trigger finger, left ring finger 08/10/2023   Trigger thumb of right hand 08/10/2023   Arthralgia of left knee 12/24/2022   Pain of lumbar spine 12/24/2022   Diabetic peripheral neuropathy (HCC) 09/11/2022   Disorder of both ulnar nerves 09/11/2022   Acquired trigger finger of left ring finger 08/31/2022   Bilateral carpal tunnel syndrome 08/31/2022   Pain in both hands 08/31/2022   Paroxysmal atrial fibrillation with RVR (HCC) 11/28/2020   Hypertensive urgency 11/28/2020   Syncope and collapse 11/28/2020   Nausea 11/28/2020   Hypokalemia 11/28/2020   Hyperglycemia due to diabetes mellitus (HCC) 11/28/2020   GERD (gastroesophageal reflux disease) 11/28/2020   Asthma 11/28/2020   Coronary atherosclerosis of native coronary artery 07/17/2013   Mixed hyperlipidemia 07/17/2013   Essential hypertension, benign 07/17/2013   Obesity 08/11/2011    PCP: Claudene Pellet MD   REFERRING PROVIDER: Melvenia Debby Cain, PA-C  REFERRING DIAG: [phone] (ICD-10-CM) - Pain in left knee  THERAPY DIAG:  Chronic pain of left knee  Bilateral hip pain  Muscle weakness (generalized)  Other low back pain  Bilateral chronic knee pain  Rationale for Evaluation and Treatment: Rehabilitation  ONSET DATE: chronic   SUBJECTIVE:   SUBJECTIVE STATEMENT:  Good    EVAL: Left knee and B hips are a problem right now. I try to flip from side to side when sleeping but they stay sore. R foot is doing better after getting a shot in it. They put gel in the left knee but its still doing its thing, I would really like to avoid surgery. Knee has been an issue for a long time. Someone at the office that did my gel shot told me to use my cane on the other side.   PERTINENT  HISTORY: See above  PAIN:  Are you having pain? Yes: NPRS scale: 2/10 Pain location: L knee  Pain description: dull and just there, toothache Aggravating factors: being up on knee too much, lifting too much  Relieving factors: exercises- doing them every day recently    PRECAUTIONS: None  RED FLAGS: None   WEIGHT BEARING RESTRICTIONS: No  FALLS:  Has patient fallen in last 6 months? Yes. 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  PATIENT SURVEYS:  LEFS  Extreme difficulty/unable (0), Quite a bit of difficulty (1), Moderate difficulty (2), Little difficulty (3), No difficulty (4) Survey date:  02/14/24 eval   Any of your usual work, housework or school activities   2. Usual hobbies, recreational or sporting activities   3. Getting into/out of the bath   4. Walking between rooms   5. Putting on socks/shoes   6. Squatting    7. Lifting an object, like a bag of groceries from the floor   8. Performing light activities around your home   9. Performing heavy activities around your home   10. Getting into/out of a car   11. Walking 2 blocks   12. Walking 1 mile   13. Going up/down 10 stairs (1 flight)   14. Standing for 1 hour   15.  sitting for 1 hour   16. Running on even ground   17. Running on uneven ground   18. Making sharp turns while running fast   19. Hopping    20. Rolling over in bed   Score total:  31/80      COGNITION: Overall cognitive status: Within functional limits for tasks assessed     SENSATION: Not tested    LOWER EXTREMITY ROM:  Active ROM Left eval L 02/24/24 Left 03/01/24  Hip flexion     Hip extension     Hip abduction     Hip adduction     Hip internal rotation     Hip external rotation     Knee flexion ~90*, stiff  ~97* seated AROM  100 deg AROM  Knee extension WNL   0  Ankle dorsiflexion     Ankle plantarflexion     Ankle inversion     Ankle eversion      (Blank rows = not tested)  LOWER EXTREMITY MMT:  MMT Right eval Left eval  Hip flexion 4 4  Hip extension    Hip abduction Seated 3 at best Seated 3 at best   Hip adduction    Hip internal rotation    Hip external rotation    Knee flexion 5 5  Knee extension 5 5 pain  Ankle dorsiflexion    Ankle plantarflexion    Ankle inversion    Ankle eversion     (Blank rows = not tested)                                                                                                                                  TREATMENT DATE:  03/01/24 NuStep L 5 x 7 min 5x sit to stand   16.96 seconds  L knee PROM w/ end range holds  Pt wont relax for patellar mobs 4in step ups 2 rails x10 each LAQ LLE 2lb 2x15  HS curls green 2x15 Sit to stand holding yellow ball 2x10  02/28/24 NuStep L5 x 7 min Sit to stands 2x10 LAQ  x10  4in step ups 2 rails 2x5 Standing march 2x10 Heel raises 2x10   02/24/24  Attempted bike for knee ROM, unable to tolerate Nustep L5x8 minutes all four extremities, seat 8 ROM check, goals  LAQs 5# 2x15 B  STS with 5# in goblet hold 2x10  Hip hikes x10 B Hip hikes + ABD x10 B Tried figure 4 stetch seated- R x30s, unable to tolerate L due to knee pain Standing HS stretch 1x30 seconds B       02/21/24: Nustep: L 5 x 6 min Ue's and LE's Therapeutic exercise: Seated hip abd/ ER blue band around knees Seated marching blue t band around knees  In ll bars for heel/toe rocks In  ll bars for standing SLR, 2 x 5 each In ll bars for standing hip ext 2 x 5 each Knee ext 5# Knee flexion 25# In ll bars for staggered position with feet shoulder width apart and knees slightly bent, for rotational reaches, B hand with 5# kettle bell to opposite lateral knee 5 x each side With 5# kettle bell, mini squats to place bell on/ off 18 box   02/14/24  Eval, POC, HEP  Nustep L2x6 minutes seat 8 all four extremities   Education as below     PATIENT EDUCATION:  Education details: exam findings, POC, HEP, trial of PT but if pain does not improve along with objective measures would benefit from return to MD for next steps in care, correct cane use  Person educated: Patient Education method: Explanation, Demonstration, and Handouts Education comprehension: verbalized understanding, returned demonstration, and needs further education  HOME EXERCISE PROGRAM: Access Code: [email or web address] URL: [email or web address] Date: 02/28/2024 Prepared by: Tanda Sorrow  Exercises - Seated Hip Abduction/External Rotation With Resistance at Thighs  - 1 x daily - 3 x weekly - 3 sets - 10 reps - Seated March with Resistance  - 1 x daily - 3 x weekly - 3 sets - 10 reps - Heel Raise  - 1 x daily - 3 x weekly - 3 sets - 10 reps - Standing Hip Extension  - 1 x daily - 3 x weekly - 3 sets - 10 reps - Standing Repeated Hip Flexion with Ankle Weight  - 1 x daily - 3 x weekly - 3 sets - 10 reps - Seated Long Arc Quad  - 1 x daily - 7 x weekly - 3 sets - 10 reps - Sit to Stand  - 1 x daily - 7 x weekly - 3 sets - 10 reps ASSESSMENT:   CLINICAL IMPRESSION:   Arrived today doing OK, she has progressed towards goals. we kept working on functional strengthening and knee ROM this visit. Increase L knee pain with eccentric loads during step ups.  Increase fatigue with sit to stands. End range knee pain with L knee flexion PROM.  General prognosis remains fair, will monitor progress closely.     EVAL: Patient is a 69 y.o. F very well known to this clinic, who was seen today for physical therapy evaluation and treatment for L knee pain. She reports some B hip pain preventing her from being able to sleep well especially in sidelying which we can work to address here as well. Had a gel shot in her L knee with limited relief. Will make every effort to address pain and objective limitations moving forward, may need to return to MD for next steps in care if pain does not improve in this round of skilled PT  care.    OBJECTIVE IMPAIRMENTS: Abnormal gait, decreased activity tolerance, decreased mobility, difficulty walking, decreased ROM, decreased strength, obesity, and pain.   ACTIVITY LIMITATIONS: standing, squatting, sleeping, stairs, transfers, and locomotion level  PARTICIPATION LIMITATIONS: meal prep, cleaning, laundry, driving, shopping, community activity, and yard work  PERSONAL FACTORS: Age, Education, Fitness, Past/current experiences, Social background, and Time since onset of injury/illness/exacerbation are also affecting patient's functional outcome.   REHAB POTENTIAL: Fair limited progress in terms of pain control with PT in the past   CLINICAL DECISION MAKING: Stable/uncomplicated  EVALUATION COMPLEXITY: Low   GOALS: Goals reviewed with patient? No  SHORT TERM GOALS: Target date: 03/06/2024   Will be compliant with appropriate progressive HEP  Baseline: Goal status: Met 03/01/24  2.  5x to have improved by 25%  Baseline:  Goal status: Progressing 03/01/24    LONG TERM GOALS: Target date: 03/27/2024    MMT to have improved by at least one grade in all weak groups  Baseline:  Goal status: INITIAL  2.  Will be able to sleep on her side as desired without increase in pain  Baseline:  Goal status: ongoing 03/01/24  3.  Will be able to perform all care duties for her sister without difficulty or increased pain  Baseline:  Goal status: INITIAL  4.  LEFS to  improve by 10 points Baseline:  Goal status: INITIAL  5. L knee flexion AROM to be at least 110* Baseline: Goal status: ONGOING/ADDENDED 02/24/24 see chart, PT accidentally listed R instead of L knee for goal at eval  Progressing 03/01/24    PLAN:  PT FREQUENCY: 2x/week  PT DURATION: 6 weeks  PLANNED INTERVENTIONS: 97750- Physical Performance Testing, 97110-Therapeutic exercises, 97530- Therapeutic activity, 97112- Neuromuscular re-education, 97535- Self Care, [id number]- Manual therapy, and [id number]- Gait training  PLAN FOR NEXT SESSION:  knee ROM and general strength. Hard DC at end of initial POC if not making significant progress, monitor closely   Tanda Sorrow, PTA 03/01/24 10:21 AM  "

## 2024-03-06 ENCOUNTER — Ambulatory Visit: Admitting: Physical Therapy

## 2024-03-08 ENCOUNTER — Encounter: Payer: Self-pay | Admitting: Physical Therapy

## 2024-03-08 ENCOUNTER — Ambulatory Visit: Admitting: Physical Therapy

## 2024-03-08 DIAGNOSIS — M6281 Muscle weakness (generalized): Secondary | ICD-10-CM

## 2024-03-08 DIAGNOSIS — G8929 Other chronic pain: Secondary | ICD-10-CM

## 2024-03-08 DIAGNOSIS — M25551 Pain in right hip: Secondary | ICD-10-CM

## 2024-03-08 DIAGNOSIS — M25562 Pain in left knee: Secondary | ICD-10-CM | POA: Diagnosis not present

## 2024-03-08 NOTE — Therapy (Signed)
 " OUTPATIENT PHYSICAL THERAPY LOWER EXTREMITY TREATMENT   Patient Name: [name] MRN: [id number] DOB:[birth year], 69 y.o., female Today's Date: 03/08/2024  END OF SESSION:  PT End of Session - 03/08/24 1027     Visit Number 6    Number of Visits 13    Date for Recertification  03/27/24    Authorization Type [employer] [facility]    Authorization Time Period auth good until 03/27/24, 8 visits    Progress Note Due on Visit 10    PT Start Time 1024   arrived a few minutes late   PT Stop Time 1056    PT Time Calculation (min) 32 min    Activity Tolerance Patient limited by pain;Patient limited by fatigue    Behavior During Therapy [facility] for tasks assessed/performed             Past Medical History:  Diagnosis Date   Arthritis    Asthma    CHF (congestive heart failure) (HCC)    Coronary artery disease    Diabetes mellitus    Diabetic neuropathy (HCC)    Dysrhythmia    A.fib    takes Eliquis    GERD (gastroesophageal reflux disease)    History of hiatal hernia    History of kidney stones    Hx of cardiovascular stress test    Lexiscan  Myoview (10/15):  Medium area of ischemia in AL and IL distribution, cannot completely exclude shifting breast attenuation, EF 64%; Abnormal study   Hyperlipidemia    Hypertension    Mild carotid artery disease    Morbid obesity (HCC)    PAF (paroxysmal atrial fibrillation) (HCC)    Sleep apnea    does not wear CPAP   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   Syncope    Past Surgical History:  Procedure Laterality Date   ACHILLES TENDON REPAIR  1995   right   BIOPSY  07/22/2021   Procedure: BIOPSY;  Surgeon: Saintclair Jasper, MD;  Location: [facility];  Service: Gastroenterology;;   CATARACT EXTRACTION Bilateral    COLONOSCOPY     COLONOSCOPY WITH PROPOFOL  N/A 07/22/2021   Procedure: COLONOSCOPY WITH PROPOFOL ;  Surgeon: Saintclair Jasper, MD;  Location: [facility];  Service: Gastroenterology;  Laterality: N/A;   CYSTOSCOPY W/ RETROGRADES Right 06/15/2023    Procedure: CYSTOSCOPY, WITH RETROGRADE PYELOGRAM;  Surgeon: Shane Steffan BROCKS, MD;  Location: [facility];  Service: Urology;  Laterality: Right;   CYSTOSCOPY/URETEROSCOPY/HOLMIUM LASER/STENT PLACEMENT Right 06/15/2023   Procedure: CYSTOSCOPY/URETEROSCOPY/HOLMIUM LASER/STENT PLACEMENT;  Surgeon: Shane Steffan BROCKS, MD;  Location: [facility];  Service: Urology;  Laterality: Right;   DILATION AND CURETTAGE OF UTERUS     ESOPHAGOGASTRODUODENOSCOPY (EGD) WITH PROPOFOL  N/A 07/22/2021   Procedure: ESOPHAGOGASTRODUODENOSCOPY (EGD) WITH PROPOFOL ;  Surgeon: Saintclair Jasper, MD;  Location: [facility];  Service: Gastroenterology;  Laterality: N/A;   EYE SURGERY Left    detached retina repair   HAND SURGERY Right    right  pinky finger   ORIF   heart stent  2007   PARS PLANA VITRECTOMY Left 03/24/2017   Procedure: PARS PLANA VITRECTOMY WITH 25 GAUGE;  Surgeon: Elner Arley LABOR, MD;  Location: [facility] OR;  Service: Ophthalmology;  Laterality: Left;   POLYPECTOMY  07/22/2021   Procedure: POLYPECTOMY;  Surgeon: Saintclair Jasper, MD;  Location: [facility];  Service: Gastroenterology;;   UTERINE FIBROID SURGERY  2008   Patient Active Problem List   Diagnosis Date Noted   Abnormal EEG 01/18/2024   Adjustment disorder 01/18/2024   Allergic rhinitis 01/18/2024  Allergic rhinitis due to pollen 01/18/2024   Anxiety 01/18/2024   Cerebrovascular disease 01/18/2024   Claustrophobia 01/18/2024   Colon cancer screening 01/18/2024   Dysuria 01/18/2024   Epigastric pain 01/18/2024   Late effects of cerebrovascular disease 01/18/2024   Long term current use of insulin  (HCC) 01/18/2024   Loose stools 01/18/2024   Major depressive disorder, recurrent, moderate (HCC) 01/18/2024   Mild nonproliferative diabetic retinopathy associated with type 2 diabetes mellitus (HCC) 01/18/2024   Mild persistent asthma without complication 01/18/2024   Polyneuropathy due to type 2 diabetes mellitus (HCC) 01/18/2024   Primary insomnia 01/18/2024    Proliferative diabetic retinopathy associated with type 2 diabetes mellitus (HCC) 01/18/2024   Pure hypercholesterolemia 01/18/2024   Situational stress 01/18/2024   Thrombophilia 01/18/2024   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, left eye (HCC) 01/18/2024   Type 2 diabetes mellitus with other specified complication (HCC) 01/18/2024   Trigger finger, left ring finger 08/10/2023   Trigger thumb of right hand 08/10/2023   Arthralgia of left knee 12/24/2022   Pain of lumbar spine 12/24/2022   Diabetic peripheral neuropathy (HCC) 09/11/2022   Disorder of both ulnar nerves 09/11/2022   Acquired trigger finger of left ring finger 08/31/2022   Bilateral carpal tunnel syndrome 08/31/2022   Pain in both hands 08/31/2022   Paroxysmal atrial fibrillation with RVR (HCC) 11/28/2020   Hypertensive urgency 11/28/2020   Syncope and collapse 11/28/2020   Nausea 11/28/2020   Hypokalemia 11/28/2020   Hyperglycemia due to diabetes mellitus (HCC) 11/28/2020   GERD (gastroesophageal reflux disease) 11/28/2020   Asthma 11/28/2020   Coronary atherosclerosis of native coronary artery 07/17/2013   Mixed hyperlipidemia 07/17/2013   Essential hypertension, benign 07/17/2013   Obesity 08/11/2011    PCP: Claudene Pellet MD   REFERRING PROVIDER: Melvenia Debby Cain, PA-C  REFERRING DIAG: [phone] (ICD-10-CM) - Pain in left knee  THERAPY DIAG:  Chronic pain of left knee  Bilateral hip pain  Muscle weakness (generalized)  Rationale for Evaluation and Treatment: Rehabilitation  ONSET DATE: chronic   SUBJECTIVE:   SUBJECTIVE STATEMENT:   Feeling a little sore today maybe from the cold, didn't sleep well last night. Going up steps is hard, feel the knee after. Still having to lead with R foot on stairs rather than L.    EVAL: Left knee and B hips are a problem right now. I try to flip from side to side when sleeping but they stay sore. R foot  is doing better after getting a shot in it. They put gel in the left knee but its still doing its thing, I would really like to avoid surgery. Knee has been an issue for a long time. Someone at the office that did my gel shot told me to use my cane on the other side.   PERTINENT HISTORY: See above  PAIN:  Are you having pain? Yes: NPRS scale: 3/10 Pain location: L knee  Pain description: dull and just there Aggravating factors: being up on knee too much, lifting too much, stairs   Relieving factors: exercises- doing them every day recently    PRECAUTIONS: None  RED FLAGS: None   WEIGHT BEARING RESTRICTIONS: No  FALLS:  Has patient fallen in last 6 months? Yes. 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    PATIENT SURVEYS:  LEFS  Extreme difficulty/unable (0), Quite a bit of difficulty (1), Moderate difficulty (2), Little difficulty (3), No difficulty (4) Survey date:  02/14/24 eval   Any of your usual work, housework or school activities   2. Usual hobbies, recreational or sporting activities   3. Getting into/out of the bath   4. Walking between rooms   5. Putting on socks/shoes   6. Squatting    7. Lifting an object, like a bag of groceries from the floor   8. Performing light activities around your home   9. Performing heavy activities around your home   10. Getting into/out of a car   11. Walking 2 blocks   12. Walking  1 mile   13. Going up/down 10 stairs (1 flight)   14. Standing for 1 hour   15.  sitting for 1 hour   16. Running on even ground   17. Running on uneven ground   18. Making sharp turns while running fast   19. Hopping    20. Rolling over in bed   Score total:  31/80     COGNITION: Overall cognitive status: Within functional limits for tasks assessed     SENSATION: Not tested    LOWER EXTREMITY ROM:  Active ROM Left eval L 02/24/24 Left 03/01/24  Hip flexion     Hip extension     Hip abduction     Hip adduction     Hip internal rotation     Hip external rotation     Knee flexion ~90*, stiff  ~97* seated AROM  100 deg AROM  Knee extension WNL   0  Ankle dorsiflexion     Ankle plantarflexion     Ankle inversion     Ankle eversion      (Blank rows = not tested)  LOWER EXTREMITY MMT:  MMT Right eval Left eval R 03/08/24 L 03/08/24  Hip flexion 4 4 4+ 4+ pain   Hip extension      Hip abduction Seated 3 at best Seated 3 at best  Seated 3+ at best  Seated 3+ at best   Hip adduction      Hip internal rotation      Hip external rotation      Knee flexion 5 5  4  pain   Knee extension 5 5 pain 5 4+ pain   Ankle dorsiflexion      Ankle plantarflexion      Ankle inversion      Ankle eversion       (Blank rows = not tested)  TREATMENT DATE:   03/08/24  Nustep L5x8 min, all four extremities, seat 10 MMT update, goals Forward step ups 4 inch step 1x10 B STS with 4# goblet hold x10 from standard chair   Had lots of questions today- we discussed hemp oil (advised her to consult with PCP to make sure there are no drug interactions with what she is currently taking), also discussed gel shots vs cartilage procedures and how different people respond to and recover from them differently, insurance companies usually require specific sequence of  interventions before approving next step per their protocol, cold weather and affect on pain levels in general, form for seated piriformis stretch       03/01/24 NuStep L 5 x 7 min 5x sit to stand   16.96 seconds  L knee PROM w/ end range holds  Pt wont relax for patellar mobs 4in step ups 2 rails x10 each LAQ LLE 2lb 2x15  HS curls green 2x15 Sit to stand holding yellow ball 2x10  02/28/24 NuStep L5 x 7 min Sit to stands 2x10 LAQ x10  4in step ups 2 rails 2x5 Standing march 2x10 Heel raises 2x10   02/24/24  Attempted bike for knee ROM, unable to tolerate Nustep L5x8 minutes all four extremities, seat 8 ROM check, goals  LAQs 5# 2x15 B  STS with 5# in goblet hold 2x10  Hip hikes x10 B Hip hikes + ABD x10 B Tried figure 4 stetch seated- R x30s, unable to tolerate L due to knee pain Standing HS stretch 1x30 seconds B       02/21/24: Nustep: L 5 x 6 min Ue's and LE's Therapeutic exercise: Seated hip abd/ ER blue band around knees Seated marching blue t band around knees  In ll bars for heel/toe rocks In ll bars for standing SLR, 2 x 5 each In ll bars for standing hip ext 2 x 5 each Knee ext 5# Knee flexion 25# In ll bars for staggered position with feet shoulder width apart and knees slightly bent, for rotational reaches, B hand with 5# kettle bell to opposite lateral knee 5 x each side With 5# kettle bell, mini squats to place bell on/ off 18 box   02/14/24  Eval, POC, HEP  Nustep L2x6 minutes seat 8 all four extremities   Education as below     PATIENT EDUCATION:  Education details: exam findings, POC, HEP, trial of PT but if pain does not improve along with objective measures would benefit from return to MD for next steps in care, correct cane use  Person educated: Patient Education method: Explanation, Demonstration, and Handouts Education comprehension: verbalized understanding, returned demonstration, and needs further education  HOME EXERCISE  PROGRAM: Access Code: [email or web address] URL: [email or web address] Date: 02/28/2024 Prepared by: Tanda Sorrow  Exercises - Seated Hip Abduction/External Rotation With Resistance at Thighs  - 1 x daily - 3 x weekly - 3 sets - 10 reps - Seated March with Resistance  - 1 x daily - 3 x weekly - 3 sets - 10 reps - Heel Raise  - 1 x daily - 3 x weekly - 3 sets - 10 reps - Standing Hip Extension  - 1 x daily - 3 x weekly - 3 sets - 10 reps - Standing Repeated Hip Flexion with Ankle Weight  - 1 x daily - 3 x weekly - 3 sets - 10 reps - Seated Long Arc Quad  - 1 x daily - 7 x weekly - 3 sets - 10  reps - Sit to Stand  - 1 x daily - 7 x weekly - 3 sets - 10 reps ASSESSMENT:   CLINICAL IMPRESSION:   Arrived today doing OK, continues to have some knee pain and difficulty with steps as well. Checked MMT, she does show some strength gains in some muscles but also some pain inhibition in others. Otherwise kept working on functional strengthening as time and pain allowed. Will continue to work towards all functional goals- will plan for more in depth re-assess in 2 visits when she will be due for insurance re-auth anyway.   EVAL: Patient is a 69 y.o. F very well known to this clinic, who was seen today for physical therapy evaluation and treatment for L knee pain. She reports some B hip pain preventing her from being able to sleep well especially in sidelying which we can work to address here as well. Had a gel shot in her L knee with limited relief. Will make every effort to address pain and objective limitations moving forward, may need to return to MD for next steps in care if pain does not improve in this round of skilled PT care.    OBJECTIVE IMPAIRMENTS: Abnormal gait, decreased activity tolerance, decreased mobility, difficulty walking, decreased ROM, decreased strength, obesity, and pain.   ACTIVITY LIMITATIONS: standing, squatting, sleeping, stairs, transfers, and locomotion  level  PARTICIPATION LIMITATIONS: meal prep, cleaning, laundry, driving, shopping, community activity, and yard work  PERSONAL FACTORS: Age, Education, Fitness, Past/current experiences, Social background, and Time since onset of injury/illness/exacerbation are also affecting patient's functional outcome.   REHAB POTENTIAL: Fair limited progress in terms of pain control with PT in the past   CLINICAL DECISION MAKING: Stable/uncomplicated  EVALUATION COMPLEXITY: Low   GOALS: Goals reviewed with patient? No  SHORT TERM GOALS: Target date: 03/06/2024   Will be compliant with appropriate progressive HEP  Baseline: Goal status: Met 03/01/24  2.  5x to have improved by 25%  Baseline:  Goal status: Progressing 03/01/24    LONG TERM GOALS: Target date: 03/27/2024    MMT to have improved by at least one grade in all weak groups  Baseline:  Goal status: ongoing 03/08/24  2.  Will be able to sleep on her side as desired without increase in pain  Baseline:  Goal status: ongoing 03/01/24  3.  Will be able to perform all care duties for her sister without difficulty or increased pain  Baseline:  Goal status: INITIAL  4.  LEFS to improve by 10 points Baseline:  Goal status: INITIAL  5. L knee flexion AROM to be at least 110* Baseline: Goal status: ONGOING/ADDENDED 02/24/24 see chart, PT accidentally listed R instead of L knee for goal at eval  Progressing 03/01/24    PLAN:  PT FREQUENCY: 2x/week  PT DURATION: 6 weeks  PLANNED INTERVENTIONS: 97750- Physical Performance Testing, 97110-Therapeutic exercises, 97530- Therapeutic activity, 97112- Neuromuscular re-education, 97535- Self Care, [id number]- Manual therapy, and [id number]- Gait training  PLAN FOR NEXT SESSION:  knee ROM and general strength. Due for insurance re-auth on 2/4- continue if making good progress, DC to advanced HEP if not   Josette Rough, PT, DPT 03/08/24 10:58 AM   "

## 2024-03-13 ENCOUNTER — Ambulatory Visit: Admitting: Physical Therapy

## 2024-03-15 ENCOUNTER — Other Ambulatory Visit: Payer: Self-pay

## 2024-03-15 ENCOUNTER — Ambulatory Visit

## 2024-03-15 DIAGNOSIS — M6281 Muscle weakness (generalized): Secondary | ICD-10-CM

## 2024-03-15 DIAGNOSIS — M5459 Other low back pain: Secondary | ICD-10-CM

## 2024-03-15 DIAGNOSIS — G8929 Other chronic pain: Secondary | ICD-10-CM

## 2024-03-15 DIAGNOSIS — M25551 Pain in right hip: Secondary | ICD-10-CM

## 2024-03-15 NOTE — Therapy (Signed)
 " OUTPATIENT PHYSICAL THERAPY LOWER EXTREMITY TREATMENT DISCHARGE SUMMARY   Patient Name: [name] MRN: [id number] DOB:1955/03/23, 69 y.o., female Today's Date: 03/15/2024  END OF SESSION:  PT End of Session - 03/15/24 1030     Visit Number 7    Date for Recertification  03/27/24    Authorization Type [employer] MCR    Progress Note Due on Visit 10    PT Start Time 1026    PT Stop Time 1100    PT Time Calculation (min) 34 min    Activity Tolerance Patient limited by pain;Patient limited by fatigue    Behavior During Therapy [facility] for tasks assessed/performed              Past Medical History:  Diagnosis Date   Arthritis    Asthma    CHF (congestive heart failure) (HCC)    Coronary artery disease    Diabetes mellitus    Diabetic neuropathy (HCC)    Dysrhythmia    A.fib    takes Eliquis    GERD (gastroesophageal reflux disease)    History of hiatal hernia    History of kidney stones    Hx of cardiovascular stress test    Lexiscan  Myoview (10/15):  Medium area of ischemia in AL and IL distribution, cannot completely exclude shifting breast attenuation, EF 64%; Abnormal study   Hyperlipidemia    Hypertension    Mild carotid artery disease    Morbid obesity (HCC)    PAF (paroxysmal atrial fibrillation) (HCC)    Sleep apnea    does not wear CPAP   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   Syncope    Past Surgical History:  Procedure Laterality Date   ACHILLES TENDON REPAIR  1995   right   BIOPSY  07/22/2021   Procedure: BIOPSY;  Surgeon: Saintclair Jasper, MD;  Location: [facility];  Service: Gastroenterology;;   CATARACT EXTRACTION Bilateral    COLONOSCOPY     COLONOSCOPY WITH PROPOFOL  N/A 07/22/2021   Procedure: COLONOSCOPY WITH PROPOFOL ;  Surgeon: Saintclair Jasper, MD;  Location: [facility];  Service: Gastroenterology;  Laterality: N/A;   CYSTOSCOPY W/ RETROGRADES Right 06/15/2023   Procedure: CYSTOSCOPY, WITH RETROGRADE PYELOGRAM;  Surgeon: Shane Steffan BROCKS, MD;   Location: [facility];  Service: Urology;  Laterality: Right;   CYSTOSCOPY/URETEROSCOPY/HOLMIUM LASER/STENT PLACEMENT Right 06/15/2023   Procedure: CYSTOSCOPY/URETEROSCOPY/HOLMIUM LASER/STENT PLACEMENT;  Surgeon: Shane Steffan BROCKS, MD;  Location: [facility];  Service: Urology;  Laterality: Right;   DILATION AND CURETTAGE OF UTERUS     ESOPHAGOGASTRODUODENOSCOPY (EGD) WITH PROPOFOL  N/A 07/22/2021   Procedure: ESOPHAGOGASTRODUODENOSCOPY (EGD) WITH PROPOFOL ;  Surgeon: Saintclair Jasper, MD;  Location: [facility];  Service: Gastroenterology;  Laterality: N/A;   EYE SURGERY Left    detached retina repair   HAND SURGERY Right    right  pinky finger   ORIF   heart stent  2007   PARS PLANA VITRECTOMY Left 03/24/2017   Procedure: PARS PLANA VITRECTOMY WITH 25 GAUGE;  Surgeon: Elner Arley LABOR, MD;  Location: [facility];  Service: Ophthalmology;  Laterality: Left;   POLYPECTOMY  07/22/2021   Procedure: POLYPECTOMY;  Surgeon: Saintclair Jasper, MD;  Location: [facility];  Service: Gastroenterology;;   UTERINE FIBROID SURGERY  2008   Patient Active Problem List   Diagnosis Date Noted   Abnormal EEG 01/18/2024   Adjustment disorder 01/18/2024   Allergic rhinitis 01/18/2024   Allergic rhinitis due to pollen 01/18/2024   Anxiety 01/18/2024   Cerebrovascular disease 01/18/2024   Claustrophobia 01/18/2024  Colon cancer screening 01/18/2024   Dysuria 01/18/2024   Epigastric pain 01/18/2024   Late effects of cerebrovascular disease 01/18/2024   Long term current use of insulin  (HCC) 01/18/2024   Loose stools 01/18/2024   Major depressive disorder, recurrent, moderate (HCC) 01/18/2024   Mild nonproliferative diabetic retinopathy associated with type 2 diabetes mellitus (HCC) 01/18/2024   Mild persistent asthma without complication 01/18/2024   Polyneuropathy due to type 2 diabetes mellitus (HCC) 01/18/2024   Primary insomnia 01/18/2024   Proliferative diabetic retinopathy associated with type 2 diabetes mellitus (HCC)  01/18/2024   Pure hypercholesterolemia 01/18/2024   Situational stress 01/18/2024   Thrombophilia 01/18/2024   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, left eye (HCC) 01/18/2024   Type 2 diabetes mellitus with other specified complication (HCC) 01/18/2024   Trigger finger, left ring finger 08/10/2023   Trigger thumb of right hand 08/10/2023   Arthralgia of left knee 12/24/2022   Pain of lumbar spine 12/24/2022   Diabetic peripheral neuropathy (HCC) 09/11/2022   Disorder of both ulnar nerves 09/11/2022   Acquired trigger finger of left ring finger 08/31/2022   Bilateral carpal tunnel syndrome 08/31/2022   Pain in both hands 08/31/2022   Paroxysmal atrial fibrillation with RVR (HCC) 11/28/2020   Hypertensive urgency 11/28/2020   Syncope and collapse 11/28/2020   Nausea 11/28/2020   Hypokalemia 11/28/2020   Hyperglycemia due to diabetes mellitus (HCC) 11/28/2020   GERD (gastroesophageal reflux disease) 11/28/2020   Asthma 11/28/2020   Coronary atherosclerosis of native coronary artery 07/17/2013   Mixed hyperlipidemia 07/17/2013   Essential hypertension, benign 07/17/2013   Obesity 08/11/2011    PCP: Claudene Pellet MD   REFERRING PROVIDER: Melvenia Debby Cain, PA-C  REFERRING DIAG: [phone] (ICD-10-CM) - Pain in left knee  THERAPY DIAG:  No diagnosis found.  Rationale for Evaluation and Treatment: Rehabilitation  ONSET DATE: chronic   SUBJECTIVE:   SUBJECTIVE STATEMENT:   Feeling a little sore today maybe from the cold, didn't sleep well last night. Going up steps is hard, feel the knee after. Still having to lead with R foot on stairs rather than L.    EVAL: Left knee and B hips are a problem right now. I try to flip from side to side when sleeping but they stay sore. R foot is doing better after getting a shot in it. They put gel in the left knee but its still doing its thing, I would really like to avoid surgery.  Knee has been an issue for a long time. Someone at the office that did my gel shot told me to use my cane on the other side.   PERTINENT HISTORY: See above  PAIN:  Are you having pain? Yes: NPRS scale: 3/10 Pain location: L knee  Pain description: dull and just there Aggravating factors: being up on knee too much, lifting too much, stairs   Relieving factors: exercises- doing them every day recently    PRECAUTIONS: None  RED FLAGS: None   WEIGHT BEARING RESTRICTIONS: No  FALLS:  Has patient fallen in last 6 months? Yes. 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    PATIENT SURVEYS:  LEFS  Extreme difficulty/unable (0), Quite a bit of difficulty (1), Moderate difficulty (2), Little difficulty (3), No difficulty (4) Survey date:  02/14/24 eval  03/15/24  Any of your usual work, housework or school activities  2  2. Usual hobbies, recreational or sporting activities  3  3. Getting into/out of the bath  3  4. Walking between rooms  4  5. Putting on socks/shoes  3  6. Squatting   3  7. Lifting an object, like a bag of groceries from the floor  3  8. Performing light activities around your home  3  9. Performing heavy activities around your home  2  10. Getting into/out of a car  4  11. Walking 2 blocks  0  12. Walking 1 mile  0  13. Going up/down 10 stairs (1 flight)  2  14. Standing for 1 hour  1  15.  sitting for 1 hour  0   16. Running on even ground  0  17. Running on uneven ground  0  18. Making sharp turns while running fast  0  19. Hopping   0  20. Rolling over in bed  4  Score total:  31/80 37     COGNITION: Overall cognitive status: Within functional limits for tasks assessed     SENSATION: Not tested    LOWER EXTREMITY ROM:  Active ROM Left eval L 02/24/24 Left 03/01/24 03/15/24  Hip flexion      Hip extension      Hip abduction      Hip adduction      Hip internal rotation      Hip external rotation      Knee flexion ~90*, stiff  ~97* seated AROM  100 deg AROM 104 AROM  Knee extension WNL   0   Ankle dorsiflexion      Ankle plantarflexion      Ankle inversion      Ankle eversion       (Blank rows = not tested)  LOWER EXTREMITY MMT:  MMT Right eval Left eval R 03/08/24 L 03/08/24  Hip flexion 4 4 4+ 4+ pain   Hip extension      Hip abduction Seated 3 at best Seated 3 at best  Seated 3+ at best  Seated 3+ at best   Hip adduction      Hip internal rotation      Hip external rotation      Knee flexion 5 5  4  pain   Knee extension 5 5 pain 5 4+ pain   Ankle dorsiflexion      Ankle plantarflexion      Ankle inversion      Ankle eversion       (Blank rows = not tested)  TREATMENT DATE:  03/15/24: Nustep L 5 x 6 min Gait in clinic, no device, x 400', 3 min 10 sec then stopped due to LBP 5 x sit to stand 16.9 sec , no arm rests Reprinted her HEP, reviewed all of the listed ex and she was able to demonstrate well Gait up / down clinic steps , advised her to utilize step to pattern to address her L knee instability and degenerative OA, for jt protection.  03/08/24  Nustep L5x8 min, all four extremities, seat 10 MMT update, goals Forward step ups 4 inch step 1x10 B STS with 4# goblet hold x10 from standard chair   Had lots of questions today- we  discussed hemp oil (advised her to consult with PCP to make sure there are no drug interactions with what she is currently taking), also discussed gel shots vs cartilage procedures and how different people respond to and recover from them differently, insurance companies usually require specific sequence of interventions before approving next step per their protocol, cold weather and affect on pain levels in general, form for seated piriformis stretch       03/01/24 NuStep L 5 x 7 min 5x sit to stand   16.96 seconds  L knee PROM w/ end range holds  Pt wont relax for patellar mobs 4in step ups 2 rails x10 each LAQ LLE 2lb 2x15  HS curls green 2x15 Sit to stand holding yellow ball 2x10  02/28/24 NuStep L5 x 7 min Sit to stands 2x10 LAQ x10  4in step ups 2 rails 2x5 Standing march 2x10 Heel raises 2x10   02/24/24  Attempted bike for knee ROM, unable to tolerate Nustep L5x8 minutes all four extremities, seat 8 ROM check, goals  LAQs 5# 2x15 B  STS with 5# in goblet hold 2x10  Hip hikes x10 B Hip hikes + ABD x10 B Tried figure 4 stetch seated- R x30s, unable to tolerate L due to knee pain Standing HS stretch 1x30 seconds B       02/21/24: Nustep: L 5 x 6 min Ue's and LE's Therapeutic exercise: Seated hip abd/ ER blue band around knees Seated marching blue t band around knees  In ll bars for heel/toe rocks In ll bars for standing SLR, 2 x 5 each In ll bars for standing hip ext 2 x 5 each Knee ext 5# Knee flexion 25# In ll bars for staggered position with feet shoulder width apart and knees slightly bent, for rotational reaches, B hand with 5# kettle bell to opposite lateral knee 5 x each side With 5# kettle bell, mini squats to place bell on/ off 18 box   02/14/24  Eval, POC, HEP  Nustep L2x6 minutes seat 8 all four extremities   Education as below     PATIENT EDUCATION:  Education details: exam findings, POC, HEP, trial of PT but if pain does not improve  along with objective measures would benefit from return to MD for next steps in care, correct cane use  Person educated: Patient Education method: Explanation, Demonstration, and Handouts Education comprehension: verbalized understanding, returned demonstration, and needs further education  HOME EXERCISE PROGRAM: Access Code: [email or web address] URL: [email or web address] Date: 02/28/2024 Prepared by: Tanda Sorrow  Exercises - Seated Hip Abduction/External Rotation With Resistance at Thighs  - 1 x daily - 3 x weekly - 3 sets - 10 reps - Seated March with Resistance  - 1 x daily - 3 x weekly - 3 sets - 10 reps - Heel Raise  -  1 x daily - 3 x weekly - 3 sets - 10 reps - Standing Hip Extension  - 1 x daily - 3 x weekly - 3 sets - 10 reps - Standing Repeated Hip Flexion with Ankle Weight  - 1 x daily - 3 x weekly - 3 sets - 10 reps - Seated Long Arc Quad  - 1 x daily - 7 x weekly - 3 sets - 10 reps - Sit to Stand  - 1 x daily - 7 x weekly - 3 sets - 10 reps ASSESSMENT:   CLINICAL IMPRESSION: Ms Pixler returned today to address her B LE weakness, endurance deficits, strength deficits which have affected her mobility.  We had a long discussion regarding whether ongoing PT vs DC for her to participate at home and at [facility] would be appropriate.  She does have a comprehensive home exercise program and she also has a [employer] membership, determined she would get benefit from use of a recumbent cycle or recumbent stepper for ongoing maintenance and engagement of her LE strength and endurance.  She has partially met her goals.  We opted for discharge from formal PT at this time, she does have a lot of time constraints due to caring for her chronically ill sister as well.      EVAL: Patient is a 58 y.o. F very well known to this clinic, who was seen today for physical therapy evaluation and treatment for L knee pain. She reports some B hip pain preventing her from being able to sleep well  especially in sidelying which we can work to address here as well. Had a gel shot in her L knee with limited relief. Will make every effort to address pain and objective limitations moving forward, may need to return to MD for next steps in care if pain does not improve in this round of skilled PT care.    OBJECTIVE IMPAIRMENTS: Abnormal gait, decreased activity tolerance, decreased mobility, difficulty walking, decreased ROM, decreased strength, obesity, and pain.   ACTIVITY LIMITATIONS: standing, squatting, sleeping, stairs, transfers, and locomotion level  PARTICIPATION LIMITATIONS: meal prep, cleaning, laundry, driving, shopping, community activity, and yard work  PERSONAL FACTORS: Age, Education, Fitness, Past/current experiences, Social background, and Time since onset of injury/illness/exacerbation are also affecting patient's functional outcome.   REHAB POTENTIAL: Fair limited progress in terms of pain control with PT in the past   CLINICAL DECISION MAKING: Stable/uncomplicated  EVALUATION COMPLEXITY: Low   GOALS: Goals reviewed with patient? No  SHORT TERM GOALS: Target date: 03/06/2024   Will be compliant with appropriate progressive HEP  Baseline: Goal status: Met 03/01/24  2.  5x to have improved by 25%  Baseline:  Goal status: Progressing 03/01/24 03/15/24: 16.69 sec today   LONG TERM GOALS: Target date: 03/27/2024    MMT to have improved by at least one grade in all weak groups  Baseline:  Goal status: ongoing 03/08/24  2.  Will be able to sleep on her side as desired without increase in pain  Baseline:  Goal status: ongoing 03/01/24 03/15/24: still pain with sleeping on side  3.  Will be able to perform all care duties for her sister without difficulty or increased pain  Baseline:  Goal status: INITIAL 03/15/24: reports no issues with caring for her sister currently  4.  LEFS to improve by 10 points Baseline:  Goal status: INITIAL Improved from 32 to 27, gaol  not met but progressed  5. L knee flexion AROM to  be at least 110* Baseline: Goal status: ONGOING/ADDENDED 02/24/24 see chart, PT accidentally listed R instead of L knee for goal at eval  Progressing 03/01/24 03/15/24: measured 104 degrees, which is best measurement so far    PLAN:  PT FREQUENCY: 2x/week  PT DURATION: 6 weeks  PLANNED INTERVENTIONS: 97750- Physical Performance Testing, 97110-Therapeutic exercises, 97530- Therapeutic activity, 97112- Neuromuscular re-education, 97535- Self Care, [id number]- Manual therapy, and [id number]- Gait training  PLAN FOR NEXT SESSION:  DC today, pt to continue at home on her own.      Greig Credit, PT, DPT OCS 03/15/24 1:29 PM   "

## 2024-04-19 ENCOUNTER — Ambulatory Visit: Admitting: Podiatry
